# Patient Record
Sex: Female | Born: 1955
Health system: Southern US, Community
[De-identification: ages and names within clinical notes are randomized; demographics above are authoritative.]

## PROBLEM LIST (undated history)

## (undated) DIAGNOSIS — I10 Essential (primary) hypertension: Secondary | ICD-10-CM

## (undated) DIAGNOSIS — F419 Anxiety disorder, unspecified: Secondary | ICD-10-CM

## (undated) DIAGNOSIS — N809 Endometriosis, unspecified: Secondary | ICD-10-CM

## (undated) DIAGNOSIS — N879 Dysplasia of cervix uteri, unspecified: Secondary | ICD-10-CM

## (undated) DIAGNOSIS — R51 Headache: Secondary | ICD-10-CM

## (undated) DIAGNOSIS — C801 Malignant (primary) neoplasm, unspecified: Secondary | ICD-10-CM

## (undated) DIAGNOSIS — B001 Herpesviral vesicular dermatitis: Secondary | ICD-10-CM

## (undated) DIAGNOSIS — R519 Headache, unspecified: Secondary | ICD-10-CM

## (undated) DIAGNOSIS — Z8489 Family history of other specified conditions: Secondary | ICD-10-CM

## (undated) DIAGNOSIS — K859 Acute pancreatitis without necrosis or infection, unspecified: Secondary | ICD-10-CM

## (undated) DIAGNOSIS — K635 Polyp of colon: Secondary | ICD-10-CM

## (undated) DIAGNOSIS — G4700 Insomnia, unspecified: Secondary | ICD-10-CM

## (undated) DIAGNOSIS — E119 Type 2 diabetes mellitus without complications: Secondary | ICD-10-CM

## (undated) HISTORY — PX: KNEE ARTHROSCOPY: SUR90

## (undated) HISTORY — DX: Herpesviral vesicular dermatitis: B00.1

## (undated) HISTORY — DX: Malignant (primary) neoplasm, unspecified: C80.1

## (undated) HISTORY — DX: Type 2 diabetes mellitus without complications: E11.9

## (undated) HISTORY — DX: Endometriosis, unspecified: N80.9

## (undated) HISTORY — PX: TUBAL LIGATION: SHX77

## (undated) HISTORY — DX: Essential (primary) hypertension: I10

## (undated) HISTORY — DX: Insomnia, unspecified: G47.00

## (undated) HISTORY — PX: ABDOMINAL HYSTERECTOMY: SHX81

## (undated) HISTORY — DX: Anxiety disorder, unspecified: F41.9

## (undated) HISTORY — DX: Dysplasia of cervix uteri, unspecified: N87.9

## (undated) HISTORY — PX: CHOLECYSTECTOMY: SHX55

## (undated) HISTORY — PX: OTHER SURGICAL HISTORY: SHX169

## (undated) HISTORY — PX: TONSILLECTOMY: SUR1361

---

## 1997-11-12 ENCOUNTER — Other Ambulatory Visit: Admission: RE | Admit: 1997-11-12 | Discharge: 1997-11-12 | Payer: Self-pay

## 2010-02-14 ENCOUNTER — Encounter: Admission: RE | Admit: 2010-02-14 | Discharge: 2010-02-14 | Payer: Self-pay | Admitting: Unknown Physician Specialty

## 2011-01-23 ENCOUNTER — Other Ambulatory Visit: Payer: Self-pay | Admitting: Family Medicine

## 2011-01-23 DIAGNOSIS — Z1231 Encounter for screening mammogram for malignant neoplasm of breast: Secondary | ICD-10-CM

## 2011-03-27 ENCOUNTER — Ambulatory Visit
Admission: RE | Admit: 2011-03-27 | Discharge: 2011-03-27 | Disposition: A | Discharge status: Home / Self Care | Payer: 59 | Source: Ambulatory Visit | Attending: Family Medicine | Admitting: Family Medicine

## 2011-03-27 DIAGNOSIS — Z1231 Encounter for screening mammogram for malignant neoplasm of breast: Secondary | ICD-10-CM

## 2011-09-28 LAB — BASIC METABOLIC PANEL
BUN: 19 (ref 4–21)
CREATININE: 1 (ref 0.5–1.1)
Glucose: 104
POTASSIUM: 3.7 (ref 3.4–5.3)
Sodium: 139 (ref 137–147)

## 2012-08-01 ENCOUNTER — Other Ambulatory Visit: Payer: Self-pay

## 2012-08-01 DIAGNOSIS — Z1231 Encounter for screening mammogram for malignant neoplasm of breast: Secondary | ICD-10-CM

## 2012-08-14 ENCOUNTER — Ambulatory Visit
Admission: RE | Admit: 2012-08-14 | Discharge: 2012-08-14 | Disposition: A | Discharge status: Home / Self Care | Payer: 59 | Source: Ambulatory Visit

## 2012-08-14 DIAGNOSIS — Z1231 Encounter for screening mammogram for malignant neoplasm of breast: Secondary | ICD-10-CM

## 2012-08-14 LAB — BASIC METABOLIC PANEL
BUN: 27 — AB (ref 4–21)
Creatinine: 1.1 (ref 0.5–1.1)
GLUCOSE: 93
Potassium: 4.4 (ref 3.4–5.3)
SODIUM: 138 (ref 137–147)

## 2012-09-26 LAB — BASIC METABOLIC PANEL
BUN: 15 (ref 4–21)
Creatinine: 1.1 (ref 0.5–1.1)
GLUCOSE: 93
POTASSIUM: 3.8 (ref 3.4–5.3)
SODIUM: 138 (ref 137–147)

## 2013-03-04 LAB — CBC AND DIFFERENTIAL
HEMATOCRIT: 37 (ref 36–46)
Hemoglobin: 12.6 (ref 12.0–16.0)
PLATELETS: 302 (ref 150–399)
WBC: 6.1

## 2013-03-04 LAB — BASIC METABOLIC PANEL
BUN: 15 (ref 4–21)
Creatinine: 0.9 (ref 0.5–1.1)
GLUCOSE: 74

## 2013-03-04 LAB — HEPATIC FUNCTION PANEL
ALT: 27 (ref 7–35)
AST: 21 (ref 13–35)
Alkaline Phosphatase: 102 (ref 25–125)
Bilirubin, Total: 0.2

## 2013-09-24 ENCOUNTER — Other Ambulatory Visit: Payer: Self-pay

## 2013-09-24 DIAGNOSIS — Z1231 Encounter for screening mammogram for malignant neoplasm of breast: Secondary | ICD-10-CM

## 2013-10-05 ENCOUNTER — Ambulatory Visit
Admission: RE | Admit: 2013-10-05 | Discharge: 2013-10-05 | Disposition: A | Discharge status: Home / Self Care | Payer: 59 | Source: Ambulatory Visit

## 2013-10-05 ENCOUNTER — Encounter (INDEPENDENT_AMBULATORY_CARE_PROVIDER_SITE_OTHER): Payer: Self-pay

## 2013-10-05 DIAGNOSIS — Z1231 Encounter for screening mammogram for malignant neoplasm of breast: Secondary | ICD-10-CM

## 2013-10-23 LAB — BASIC METABOLIC PANEL
BUN: 21 (ref 4–21)
Glucose: 90
POTASSIUM: 3.7 (ref 3.4–5.3)
Sodium: 139 (ref 137–147)

## 2013-10-23 LAB — LIPID PANEL
CHOLESTEROL: 204 — AB (ref 0–200)
HDL: 54 (ref 35–70)
LDL Cholesterol: 111
Triglycerides: 196 — AB (ref 40–160)

## 2014-04-30 DIAGNOSIS — C801 Malignant (primary) neoplasm, unspecified: Secondary | ICD-10-CM

## 2014-04-30 HISTORY — DX: Malignant (primary) neoplasm, unspecified: C80.1

## 2014-05-12 LAB — CBC AND DIFFERENTIAL
HEMATOCRIT: 40 (ref 36–46)
Hemoglobin: 14 (ref 12.0–16.0)
PLATELETS: 497 — AB (ref 150–399)
WBC: 8.6

## 2014-05-12 LAB — HEPATIC FUNCTION PANEL
ALT: 202 — AB (ref 7–35)
AST: 81 — AB (ref 13–35)
Alkaline Phosphatase: 383 — AB (ref 25–125)
BILIRUBIN, TOTAL: 1.1

## 2014-05-12 LAB — BASIC METABOLIC PANEL
BUN: 17 (ref 4–21)
Creatinine: 1 (ref 0.5–1.1)
Glucose: 117

## 2014-05-13 ENCOUNTER — Other Ambulatory Visit: Payer: Self-pay | Admitting: Family Medicine

## 2014-05-13 DIAGNOSIS — R748 Abnormal levels of other serum enzymes: Secondary | ICD-10-CM

## 2014-05-14 ENCOUNTER — Other Ambulatory Visit: Payer: Self-pay | Admitting: Family Medicine

## 2014-05-14 ENCOUNTER — Ambulatory Visit
Admission: RE | Admit: 2014-05-14 | Discharge: 2014-05-14 | Disposition: A | Discharge status: Home / Self Care | Payer: 59 | Source: Ambulatory Visit | Attending: Family Medicine | Admitting: Family Medicine

## 2014-05-14 DIAGNOSIS — R748 Abnormal levels of other serum enzymes: Secondary | ICD-10-CM

## 2014-05-17 ENCOUNTER — Other Ambulatory Visit: Payer: Self-pay

## 2014-05-17 LAB — HEPATIC FUNCTION PANEL
ALT: 299 — AB (ref 7–35)
AST: 154 — AB (ref 13–35)
Alkaline Phosphatase: 584 — AB (ref 25–125)
Bilirubin, Total: 1.3

## 2014-05-17 LAB — BASIC METABOLIC PANEL
BUN: 19 (ref 4–21)
Creatinine: 1.1 (ref 0.5–1.1)
GLUCOSE: 106
POTASSIUM: 3.8 (ref 3.4–5.3)
Sodium: 140 (ref 137–147)

## 2014-05-27 ENCOUNTER — Ambulatory Visit: Payer: Self-pay | Admitting: Surgery

## 2014-06-01 ENCOUNTER — Ambulatory Visit: Payer: Self-pay | Admitting: Surgery

## 2014-08-23 LAB — SURGICAL PATHOLOGY

## 2014-08-29 NOTE — Op Note (Signed)
PATIENT NAME:  Mercedes Webb, Mercedes Webb MR#:  [id number] DATE OF BIRTH:  June 10, 1955  DATE OF PROCEDURE:  06/01/2014  PREOPERATIVE DIAGNOSIS: Chronic cholecystitis, cholelithiasis.   POSTOPERATIVE DIAGNOSIS: Chronic cholecystitis, cholelithiasis.   PROCEDURE: Laparoscopic cholecystectomy.   SURGEON: Loreli Dollar, MD   ANESTHESIA: General.   INDICATIONS: This 59 year old female has a history of episodes of epigastric pain, ultrasound findings of gallstones, transient elevation of her bilirubin. Surgery was recommended for definitive treatment.   DESCRIPTION OF PROCEDURE: The patient was placed on the operating table in the supine position under general endotracheal anesthesia. The abdomen was prepared with ChloraPrep, draped in a sterile manner.   A short incision was made in the inferior aspect of the umbilicus and carried down to the deep fascia which was grasped with laryngeal hook and elevated. A Veress needle was inserted, aspirated, and irrigated with a saline solution. Next, the peritoneal cavity was inflated with carbon dioxide. The Veress needle was removed. The 10 mm cannula was inserted. The 10 mm, 0 degree laparoscope was inserted to view the peritoneal cavity. There was an appearance of fatty infiltration of the liver. There were some adhesions between the omentum and the lower abdominal wall. Another incision was made in the epigastrium, slightly to the right of the falciform ligament, to insert an 11 mm cannula. Two incisions were made in the lateral aspect of the right upper quadrant to introduce two 5 mm cannulas.   The gallbladder was found to be partially contracted and was firm at its distal end and was retracted towards the right shoulder. Multiple adhesions were taken down with hook and cautery and sharp dissection. The gallbladder was retracted inferiorly and laterally. There appeared to be inflammation of the gallbladder with some thickening of the gallbladder wall. Multiple  additional adhesions were taken down with a somewhat tedious dissection. The gallbladder neck was mobilized with incision of the visceral peritoneum. The cystic artery was identified. There appeared to be evidence of inflammation in the region of the gallbladder neck and could not identify the cystic duct. The site of the porta hepatis was observed and did not see the common bile duct due to overlying fat. Next, the gallbladder neck was further mobilized so that  dissection was carried out circumferentially around the gallbladder neck and could see where the neck was tapering down towards the cystic duct and decision was made to divide the gallbladder neck with the Endo GIA stapler and exchange the 11 mm port for a 12 mm port. Inserted the blue cartridge Endo GIA stapler and placed across the gallbladder neck, engaged, and activated, dividing the proximal portion of the gallbladder neck. Next, the gallbladder was further mobilized and separated from the liver. It was further noted that the cystic artery was controlled with Endo clips and divided. The gallbladder was further dissected free from the liver. It is noted that during the course of the procedure, a number of small bleeding points were cauterized. The site was irrigated and aspirated multiple times using heparinized saline. At the end of the dissection, it appeared that hemostasis was intact. The gallbladder was placed into an Endo Catch bag and brought out through the infraumbilical port site. It was necessary to lengthen the skin incision by approximately 1 cm and lengthen the fascial incision by approximately 1 cm, and with further manipulation, removed the gallbladder and the bag and submitted the gallbladder with multiple stones for routine pathology.   The fascial defect was partially closed with 0 Maxon figure-of-eight  suture, and reinserted the 10 mm port and reinserted the laparoscope to view the right upper quadrant. The site was irrigated with  heparinized saline solution and aspirated and determined hemostasis was intact. There was no bile leakage. Next, the cannulas were removed, seeing no bleeding from the cannula sites. The remainder of the fascial defect at the umbilicus was closed with 0 Maxon figure-of-eight suture. The skin incisions were closed with interrupted 5-0 chromic subcuticular sutures, benzoin, and Steri-Strips. Dressings were applied with paper tape. The patient tolerated surgery satisfactorily.   This case was much more difficult than the typical case due to magnitude of inflammation in the gallbladder and required some 2 hours for the operation and the patient was transferred to the recovery room for postoperative care.   ____________________________ J. Rochel Brome, MD jws:LT D: 06/01/2014 16:33:49 ET T: 06/01/2014 20:53:13 ET JOB#: [id number]  cc: Loreli Dollar, MD, <Dictator> Loreli Dollar MD ELECTRONICALLY SIGNED 06/04/2014 7:51

## 2014-09-24 ENCOUNTER — Other Ambulatory Visit: Payer: Self-pay

## 2014-09-24 DIAGNOSIS — Z1231 Encounter for screening mammogram for malignant neoplasm of breast: Secondary | ICD-10-CM

## 2014-10-22 ENCOUNTER — Ambulatory Visit
Admission: RE | Admit: 2014-10-22 | Discharge: 2014-10-22 | Disposition: A | Discharge status: Home / Self Care | Payer: 59 | Source: Ambulatory Visit

## 2014-10-22 DIAGNOSIS — Z1231 Encounter for screening mammogram for malignant neoplasm of breast: Secondary | ICD-10-CM

## 2014-12-23 ENCOUNTER — Encounter: Payer: Self-pay | Admitting: Obstetrics and Gynecology

## 2014-12-23 ENCOUNTER — Ambulatory Visit (INDEPENDENT_AMBULATORY_CARE_PROVIDER_SITE_OTHER): Payer: 59 | Admitting: Obstetrics and Gynecology

## 2014-12-23 VITALS — BP 146/69 | HR 80 | Ht 67.0 in | Wt 179.7 lb

## 2014-12-23 DIAGNOSIS — Z0000 Encounter for general adult medical examination without abnormal findings: Secondary | ICD-10-CM | POA: Diagnosis not present

## 2014-12-23 DIAGNOSIS — E669 Obesity, unspecified: Secondary | ICD-10-CM | POA: Insufficient documentation

## 2014-12-23 DIAGNOSIS — N393 Stress incontinence (female) (male): Secondary | ICD-10-CM | POA: Diagnosis not present

## 2014-12-23 DIAGNOSIS — E663 Overweight: Secondary | ICD-10-CM

## 2014-12-23 DIAGNOSIS — Z87410 Personal history of cervical dysplasia: Secondary | ICD-10-CM | POA: Diagnosis not present

## 2014-12-23 DIAGNOSIS — N958 Other specified menopausal and perimenopausal disorders: Secondary | ICD-10-CM | POA: Diagnosis not present

## 2014-12-23 DIAGNOSIS — Z01419 Encounter for gynecological examination (general) (routine) without abnormal findings: Secondary | ICD-10-CM

## 2014-12-23 DIAGNOSIS — E8940 Asymptomatic postprocedural ovarian failure: Secondary | ICD-10-CM | POA: Insufficient documentation

## 2014-12-23 DIAGNOSIS — Z90710 Acquired absence of both cervix and uterus: Secondary | ICD-10-CM | POA: Diagnosis not present

## 2014-12-23 NOTE — Progress Notes (Signed)
Patient ID: Mercedes Webb, female   DOB: 1956-03-21, 59 y.o.   MRN: [id number] ANNUAL PREVENTATIVE CARE GYN  ENCOUNTER NOTE  Subjective:       Mercedes Webb is a 59 y.o. G1P0 female here for a routine annual gynecologic exam.  Current complaints: 1.  Bladder leaking when she coughs and sneezes   Patient reports leaking urine with coughing, sneezing, laughing and lifting.  She is not currently wearing pads.  She is status post TAH and status post BSO for endometriosis. Patient denies pelvic pressure.  No bowel symptom complaints.  Patient has not seen a gynecologist since Dr. Elisabeth Pigeon, left the area approximately 7 years ago.   Gynecologic History No LMP recorded. Patient has had a hysterectomy. TAH Contraception: status post hysterectomy Status post TAH (Dr. Elisabeth Pigeon) Last Pap: 2013. Results were: normal Last mammogram: 10/22/2014 birad 1. Results were: normal History of endometriosis. Status post BSO [facility]) SUI  Obstetric History OB History  Gravida Para Term Preterm AB SAB TAB Ectopic Multiple Living  1         1    # Outcome Date GA Lbr Len/2nd Weight Sex Delivery Anes PTL Lv  1 Gravida 1974   7 lb 1.9 oz (3.23 kg) F Vag-Spont   Y      Past Medical History  Diagnosis Date  . Anxiety   . Insomnia   . Hypertension   . Recurrent cold sores   . Endometriosis     Past Surgical History  Procedure Laterality Date  . Abdominal hysterectomy      tah/bso- endometriosis  . Cholecystectomy    . Tubal ligation    . Tonsillectomy      No current outpatient prescriptions on file prior to visit.   No current facility-administered medications on file prior to visit.    No Known Allergies  Social History   Social History  . Marital Status: Divorced    Spouse Name: N/A  . Number of Children: N/A  . Years of Education: N/A   Occupational History  . Not on file.   Social History Main Topics  . Smoking status: Former Smoker    Quit date:  04/30/1988  . Smokeless tobacco: Not on file  . Alcohol Use: Yes     Comment: occas  . Drug Use: No  . Sexual Activity: Yes    Birth Control/ Protection: Surgical   Other Topics Concern  . Not on file   Social History Narrative  . No narrative on file    Family History  Problem Relation Age of Onset  . Diabetes Father   . Heart disease Father   . Breast cancer Sister 16  . Diabetes Brother   . Colon cancer Neg Hx   . Ovarian cancer Neg Hx     The following portions of the patient's history were reviewed and updated as appropriate: allergies, current medications, past family history, past medical history, past social history, past surgical history and problem list.  Review of Systems ROS Review of Systems - General ROS: negative for - chills, fatigue, fever, hot flashes, night sweats, weight gain or weight loss Psychological ROS: negative for - anxiety, decreased libido, depression, mood swings, physical abuse or sexual abuse Ophthalmic ROS: negative for - blurry vision, eye pain or loss of vision ENT ROS: negative for - headaches, hearing change, visual changes or vocal changes Allergy and Immunology ROS: negative for - hives, itchy/watery eyes or seasonal allergies Hematological and Lymphatic  ROS: negative for - bleeding problems, bruising, swollen lymph nodes or weight loss Endocrine ROS: negative for - galactorrhea, hair pattern changes, hot flashes, malaise/lethargy, mood swings, palpitations, polydipsia/polyuria, skin changes, temperature intolerance or unexpected weight changes Breast ROS: negative for - new or changing breast lumps or nipple discharge Respiratory ROS: negative for - cough or shortness of breath Cardiovascular ROS: negative for - chest pain, irregular heartbeat, palpitations or shortness of breath Gastrointestinal ROS: no abdominal pain, change in bowel habits, or black or bloody stools Genito-Urinary ROS: no dysuria, trouble voiding, or  hematuria Musculoskeletal ROS: negative for - joint pain or joint stiffness Neurological ROS: negative for - bowel and bladder control changes Dermatological ROS: negative for rash and skin lesion changes   Objective:   BP 146/69 mmHg  Pulse 80  Ht 5\' 7"  (1.702 m)  Wt 179 lb 11.2 oz (81.511 kg)  BMI 28.14 kg/m2 CONSTITUTIONAL: Well-developed, well-nourished female in no acute distress.  PSYCHIATRIC: Normal mood and affect. Normal behavior. Normal judgment and thought content. [place]: Alert and oriented to person, place, and time. Normal muscle tone coordination. No cranial nerve deficit noted. HENT:  Normocephalic, atraumatic, External right and left ear normal. Oropharynx is clear and moist EYES: Conjunctivae and EOM are normal. Pupils are equal, round, and reactive to light. No scleral icterus.  NECK: Normal range of motion, supple, no masses.  Normal thyroid.  SKIN: Skin is warm and dry. No rash noted. Not diaphoretic.Multiple macules throughout trunk and extremities; none that appear suspicious. No erythema. No pallor. CARDIOVASCULAR: Normal heart rate noted, regular rhythm, no murmur. RESPIRATORY: Clear to auscultation bilaterally. Effort and breath sounds normal, no problems with respiration noted. BREASTS: Symmetric in size. No masses, skin changes, nipple drainage, or lymphadenopathy. ABDOMEN: Soft, normal bowel sounds, no distention noted.  No tenderness, rebound or guarding.  BLADDER: Normal PELVIC:  External Genitalia: Normal  BUS: Normal  Vagina: Normal Estrogen effect; excellent support; No rotational descent of the bladder neck with Valsalva; no evidence of cystocele or rectocele  Cervix: Surgically absent  Uterus: Surgically absent  Adnexa: Nonpalpable, nontender  RV: External Exam NormaI, No Rectal Masses and Normal Sphincter tone  MUSCULOSKELETAL: Normal range of motion. No tenderness.  No cyanosis, clubbing, or edema.  2+ distal pulses. LYMPHATIC: No Axillary,  Supraclavicular, or Inguinal Adenopathy.    Assessment:   Annual gynecologic examination 59 y.o. Contraception: status post hysterectomy Over weight Stress urinary incontinence, mild. No evidence of pelvic organ prolapse. Vagina with excellent estrogen effect. Problem List Items Addressed This Visit    None      Plan:  Pap: Not needed.  No abnormal Pap smears following hysterectomy Mammogram: Not Indicated.  Already completed for this year. Stool Guaiac Testing:  colonooscopy scheduled for 01/2015 Labs: thru pcp Routine preventative health maintenance measures emphasized: Exercise/Diet/Weight control, Tobacco Warnings and Alcohol/Substance use risks Physical therapy referral for stress urinary incontinence management. Kegel exercises reviewed and encouraged Estradiol 1 mg daily, refills Calcium with vitamin D supplementation.  Encouraged Return to [place], [place]  Brayton Mars, MD

## 2014-12-23 NOTE — Patient Instructions (Signed)
1. Pap not needed 2. Mammogram already done. 3. Colonoscopy for Colon cancer screening already scheduled. 4. Continue with Calcium and Vit D supplementation. 5. PT consult for SUI ayt [facility]. 6. Return in 1 year.

## 2015-01-04 ENCOUNTER — Ambulatory Visit: Payer: 59 | Attending: Obstetrics and Gynecology | Admitting: Physical Therapy

## 2015-01-04 DIAGNOSIS — R279 Unspecified lack of coordination: Secondary | ICD-10-CM | POA: Diagnosis present

## 2015-01-04 DIAGNOSIS — M629 Disorder of muscle, unspecified: Secondary | ICD-10-CM

## 2015-01-05 NOTE — Therapy (Signed)
[place] MAIN [facility] SERVICES [address] [place], [place], [address] Phone: [phone]   Fax:  [phone]  Physical Therapy Evaluation  Patient Details  Name: Mercedes Webb MRN: [id number] Date of Birth: August 06, 1955 Referring Provider:  Brayton Mars, *  Encounter Date: 01/04/2015      PT End of Session - 01/05/15 2250    Visit Number 1   Number of Visits 12   Date for PT Re-Evaluation 03/30/15   PT Start Time 2694   PT Stop Time 1820   PT Time Calculation (min) 70 min   Activity Tolerance Patient tolerated treatment well;No increased pain   Behavior During Therapy [facility] for tasks assessed/performed      Past Medical History  Diagnosis Date  . Anxiety   . Insomnia   . Hypertension   . Recurrent cold sores   . Endometriosis   . Cervical dysplasia     h/o    Past Surgical History  Procedure Laterality Date  . Abdominal hysterectomy      tah/bso- endometriosis  . Cholecystectomy    . Tubal ligation    . Tonsillectomy      There were no vitals filed for this visit.  Visit Diagnosis:  Fascial defect - Plan: PT plan of care cert/re-cert  Lack of coordination - Plan: PT plan of care cert/re-cert      Subjective Assessment - 01/04/15 1713    Subjective Pt reports leakage bladder when laughing and sneezing hard. This Sx has been progressing worse for 3 years.  Denied leakage w/ stairclimbing, pad wear. Pt refuses to wear pads but has had to change undergarments 2x/ month. Daily fluids: 4 (12 fl oz), 1 cup tea, 1 diet soda. Bowel movements: every other day. Hx of constipation but is eating better now. Denied dyspareunia. Pt has had Hx of partial and total hysterectomy, gallbladder surgery, and endometriosis. Pt reports she has difficulty with staying asleep and has anxiety.     Pertinent History 1 vaginal delivery, episiotomy. relaxation hobbies: walking on beach, soap opera watching,    Patient Stated Goals 1) sleep  better 2) decreased bladder symptoms  3) weight loss            [facility] PT Assessment - 01/05/15 2237    Assessment   Medical Diagnosis SUI   Balance Screen   Has the patient fallen in the past 6 months Yes   Observation/Other Assessments   Other Surveys  --  PFIQ: 33%, PSQI 24 pts    Coordination   Gross Motor Movements are Fluid and Coordinated --  lumbopelvic instability w. ASLR   Posture/Postural Control   Posture Comments limited diaphragmatic excursion, chest breathing   Palpation   Palpation comment significantly fascial restriction along multiple  LQ scars                 Pelvic Floor Special Questions - 01/05/15 2249    Skin Integrity --  atrophy labia majora   Scar Other   Scar other --  assess at next session   Pelvic Floor Internal Exam pt consented and had no contraindications   Exam Type Vaginal   Palpation bladder more caudal/ventral w/ posterior pelvic tilt,     Strength fair squeeze, definite lift  4/5 post-Tx: manual and posterior tilt edu   Strength # of reps 1   Strength # of seconds 10          [facility] Adult PT Treatment/Exercise - 01/05/15  2237    Neuro Re-ed    Neuro Re-ed Details  proper sitting posture   diaphragmatic breathing   Manual Therapy   Myofascial Release abdominal massage and also guided pt                 PT Education - 01/05/15 2249    Education provided Yes   Education Details HEP, POC, anatomy/physiology with pictures   Person(s) Educated Patient   Methods Explanation;Demonstration;Tactile cues;Verbal cues;Handout   Comprehension Verbalized understanding;Returned demonstration             PT Long Term Goals - 01/05/15 2301    PT LONG TERM GOAL #1   Title Pt will decrease her score on PFIQ: 33% to 0% in order to improve QOL.   Time 12   Period Weeks   Status New   PT LONG TERM GOAL #2   Title Pt will decrease her score on PSQI 24 pts to < 14 pt in order improve her quality of sleep.    Time 12    Period Weeks   Status New   PT LONG TERM GOAL #3   Title Pt will be demo decreased chest breathing and proper diaphragmatic breathing in order to progess to pelvic floor strengthening.   Time 12   Period Weeks   Status New   PT LONG TERM GOAL #4   Title Pt will demo pelvic floor strength 3/5/5/5 in order to decrease pad wear.    Time 12   Period Weeks   Status New               Plan - 01/05/15 2250    Clinical Impression Statement Pt is a 59 yo female whose S  & Sx consist of SUI, difficulty staying asleep, anxiety, decreased pelvic floor endurance, poor coordination of deep core mm, limited diaphragmatic excursion, poor posture and body mechanics, and decreased fascial mobility over lower abdomen. These deficits impact her QOL.     Pt will benefit from skilled therapeutic intervention in order to improve on the following deficits Decreased balance;Decreased activity tolerance;Decreased mobility;Decreased strength;Postural dysfunction;Improper body mechanics;Impaired flexibility;Decreased scar mobility;Hypomobility;Increased muscle spasms;Increased fascial restrictions;Difficulty walking;Decreased range of motion;Decreased coordination;Decreased endurance;Decreased safety awareness   Rehab Potential Good   Clinical Impairments Affecting Rehab Potential multiple abdominal surgeries   PT Frequency 1x / week   PT Duration 12 weeks   PT Treatment/Interventions ADLs/Self Care Home Management;Electrical Stimulation;Cryotherapy;Biofeedback;Aquatic Therapy;Moist Heat;Traction;Therapeutic activities;Therapeutic exercise;Balance training;Neuromuscular re-education;Gait training;Stair training;Functional mobility training;Patient/family education;Scar mobilization;Manual techniques;Dry needling;Energy conservation   PT Next Visit Plan thoracic release, diaphragmatic breathing   Consulted and Agree with Plan of Care Patient         Problem List Patient Active Problem List   Diagnosis  Date Noted  . Overweight 12/23/2014  . Surgical menopause 12/23/2014  . Status post abdominal hysterectomy 12/23/2014  . History of cervical dysplasia 12/23/2014  . SUI (stress urinary incontinence, female) 12/23/2014    [name]  ,PT, DPT, E-RYT  01/05/2015, 11:08 PM  [place] MAIN [facility] SERVICES [address] [place], [place], [address] Phone: [phone]   Fax:  [phone]

## 2015-01-05 NOTE — Patient Instructions (Addendum)
                                  Abdominal Massage (Handout)                  Preserve the function of your pelvic floor, abdomen, and back. Avoid decreased straining of abdominal/pelvic floor muscles with less slouching,  holding your breath with lifting/bowel movements)                                                     FUNCTIONAL POSTURES

## 2015-01-07 ENCOUNTER — Ambulatory Visit: Payer: 59 | Admitting: Physical Therapy

## 2015-01-07 DIAGNOSIS — M629 Disorder of muscle, unspecified: Secondary | ICD-10-CM | POA: Diagnosis not present

## 2015-01-07 DIAGNOSIS — R279 Unspecified lack of coordination: Secondary | ICD-10-CM

## 2015-01-07 NOTE — Patient Instructions (Signed)
Ribcage twists  Open book   At work: seated twist and arm swings        * exhale relax upper neck and shoulders     ____________________________   Back Lengthening   Modified down ward facing dog on arm of chair  5 breaths        ____________________________  Modified down ward facing dog on arm of chair  Grab opp calf /thigh for a twist        Pat your low back and twist ribs to face the ceiling

## 2015-01-07 NOTE — Therapy (Signed)
[place] MAIN [facility] SERVICES [address] [place], [place], [address] Phone: [phone]   Fax:  [phone]  Physical Therapy Treatment  Patient Details  Name: Mercedes Webb MRN: [id number] Date of Birth: Sep 15, 1955 Referring Provider:  Brayton Mars, *  Encounter Date: 01/07/2015      PT End of Session - 01/07/15 2147    Visit Number 2   Number of Visits 12   Date for PT Re-Evaluation 03/30/15   PT Start Time 0806   PT Stop Time 0906   PT Time Calculation (min) 60 min   Activity Tolerance Patient tolerated treatment well;No increased pain   Behavior During Therapy [facility] for tasks assessed/performed      Past Medical History  Diagnosis Date  . Anxiety   . Insomnia   . Hypertension   . Recurrent cold sores   . Endometriosis   . Cervical dysplasia     h/o    Past Surgical History  Procedure Laterality Date  . Abdominal hysterectomy      tah/bso- endometriosis  . Cholecystectomy    . Tubal ligation    . Tonsillectomy      There were no vitals filed for this visit.  Visit Diagnosis:  Fascial defect  Lack of coordination      Subjective Assessment - 01/07/15 0813    Subjective Pt has been constantly keeping herself in check at her desk by taking more breaks from sitting and practicing proper sitting posture. Pt performed her abdominal massage last night. Pt found herself falling asleep herself very easily with a winddown routine of  turiing off her TV, looking at magazine, and then performed her massage.  Pt slept well and feels rested.    Pertinent History 1 vaginal delivery, episiotomy. relaxation hobbies: walking on beach, soap opera watching,    Patient Stated Goals 1) sleep better 2) decreased bladder symptoms  3) weight loss            [facility] PT Assessment - 01/07/15 2140    Observation/Other Assessments   Observations bra strap placed too high, limited diaphragmatic excursion   Posture/Postural  Control   Posture Comments increased diaphragmatic breathing post-Tx w/ 1.5 cm excursion    Palpation   Spinal mobility increased mobility of SP of T12    SI assessment  decreased mm parapinal mm tensions post-Tx  increased fascial tensions along suprapubic area                      [facility] Adult PT Treatment/Exercise - 01/07/15 2140    Neuro Re-ed    Neuro Re-ed Details  diaphragmatic breathing, pelic neutral in supine, seated   Exercises   Other Exercises  thoracic rotation and back lengthening  ROM for home, work,  see pt instructions   Manual Therapy   Joint Mobilization Grade III TP, CVJ, PA on SP of thoracic segments    Soft tissue mobilization effleurage, stripping, MWM along paraspinal bilaterally    Myofascial Release scar massage,  suprapubic area , jostling                PT Education - 01/07/15 2147    Education provided Yes   Education Details HEP   Person(s) Educated Patient   Methods Explanation;Demonstration;Tactile cues;Verbal cues;Handout   Comprehension Verbalized understanding;Returned demonstration             PT Long Term Goals - 01/05/15 2301    PT LONG TERM GOAL #  1   Title Pt will decrease her score on PFIQ: 33% to 0% in order to improve QOL.   Time 12   Period Weeks   Status New   PT LONG TERM GOAL #2   Title Pt will decrease her score on PSQI 24 pts to < 14 pt in order improve her quality of sleep.    Time 12   Period Weeks   Status New   PT LONG TERM GOAL #3   Title Pt will be demo decreased chest breathing and proper diaphragmatic breathing in order to progess to pelvic floor strengthening.   Time 12   Period Weeks   Status New   PT LONG TERM GOAL #4   Title Pt will demo pelvic floor strength 3/5/5/5 in order to decrease pad wear.    Time 12   Period Weeks   Status New               Plan - 01/07/15 2148    Clinical Impression Statement Pt is progressing well with good carry over with postural awareness  and showed increased diaphragmatic excursion post-Tx. Pt tolerate manual Tx without complaint. Pt will continue to benefit from continued manual Tx to decrease scar adhesion over abdomen.     Pt will benefit from skilled therapeutic intervention in order to improve on the following deficits Decreased balance;Decreased activity tolerance;Decreased mobility;Decreased strength;Postural dysfunction;Improper body mechanics;Impaired flexibility;Decreased scar mobility;Hypomobility;Increased muscle spasms;Increased fascial restricitons;Difficulty walking;Decreased range of motion;Decreased coordination;Decreased endurance;Decreased safety awareness   Rehab Potential Good   Clinical Impairments Affecting Rehab Potential multiple abdominal surgeries   PT Frequency 1x / week   PT Duration 12 weeks   PT Treatment/Interventions ADLs/Self Care Home Management;Electrical Stimulation;Cryotherapy;Biofeedback;Aquatic Therapy;Moist Heat;Traction;Therapeutic activities;Therapeutic exercise;Balance training;Neuromuscular re-education;Gait training;Stair training;Functional mobility training;Patient/family education;Scar mobilization;Manual techniques;Dry needling;Energy conservation   PT Next Visit Plan thoracic release, diaphragmatic breathing   Consulted and Agree with Plan of Care Patient        Problem List Patient Active Problem List   Diagnosis Date Noted  . Overweight 12/23/2014  . Surgical menopause 12/23/2014  . Status post abdominal hysterectomy 12/23/2014  . History of cervical dysplasia 12/23/2014  . SUI (stress urinary incontinence, female) 12/23/2014    [name] ,PT, DPT, E-RYT  01/07/2015, 9:54 PM  [place] MAIN [facility] SERVICES [address] [place], [place], [address] Phone: [phone]   Fax:  [phone]

## 2015-01-11 ENCOUNTER — Ambulatory Visit: Payer: 59 | Admitting: Physical Therapy

## 2015-01-11 DIAGNOSIS — M629 Disorder of muscle, unspecified: Secondary | ICD-10-CM

## 2015-01-11 DIAGNOSIS — R279 Unspecified lack of coordination: Secondary | ICD-10-CM

## 2015-01-11 NOTE — Patient Instructions (Signed)
PELVIC FLOOR / KEGEL EXERCISES   Pelvic floor/ Kegel exercises are used to strengthen the muscles in the base of your pelvis that are responsible for supporting your pelvic organs and preventing urine/feces leakage. Based on your therapist's recommendations, they can be performed while standing, sitting, or lying down.  Make yourself aware of this muscle group by using these cues:  Imagine you are in a crowded room and you feel the need to pass gas. Your response is to pull up and in at the rectum.  Close the rectum. Pull the muscles up inside your body,feeling your scrotum lifting as well . Feel the pelvic floor muscles lift as if you were walking into a cold lake.  Place your hand on top of your pubic bone. Tighten and draw in the muscles around the anal muscles without squeezing the buttock muscles.  Common Errors:  Breath holding: If you are holding your breath, you may be bearing down against your bladder instead of pulling it up. If you belly bulges up while you are squeezing, you are holding your breath. Be sure to breathe gently in and out while exercising. Counting out loud may help you avoid holding your breath.  Accessory muscle use: You should not see or feel other muscle movement when performing pelvic floor exercises. When done properly, no one can tell that you are performing the exercises. Keep the buttocks, belly and inner thighs relaxed.  Overdoing it: Your muscles can fatigue and stop working for you if you over-exercise. You may actually leak more or feel soreness at the lower abdomen or rectum.  YOUR HOME EXERCISE PROGRAM  LONG HOLDS: Position: on back  Inhale and then exhale. Then squeeze the muscle and count aloud for 8 seconds. Rest with three long breaths. (Be sure to let belly sink in with exhales and not push outward)  Perform 6 repetitions, 6 times/day  SHORT HOLDS: Position: on back, sitting   Inhale and then exhale. Then squeeze the muscle.  (Be sure to  let belly sink in with exhales and not push outward)  Perform 5 repetitions, 5  Times/day  **ALSO SQUEEZE BEFORE YOUR SNEEZE, COUGH, LAUGH to decrease downward pressure   **ALSO EXHALE BEFORE YOU RISE AGAINST GRAVITY (lifting, sit to stand, from squat to stand)

## 2015-01-11 NOTE — Therapy (Signed)
[place] MAIN [facility] SERVICES [address] [place], [place], [address] Phone: [phone]   Fax:  [phone]  Physical Therapy Treatment  Patient Details  Name: Mercedes Webb MRN: [id number] Date of Birth: [birth year] Referring Provider:  Brayton Mars, *  Encounter Date: 01/11/2015      PT End of Session - 01/11/15 1809    Visit Number 3   Number of Visits 12   Date for PT Re-Evaluation 03/30/15   PT Start Time 0507   PT Stop Time 0605   PT Time Calculation (min) 58 min   Activity Tolerance Patient tolerated treatment well;No increased pain   Behavior During Therapy [facility] for tasks assessed/performed      Past Medical History  Diagnosis Date  . Anxiety   . Insomnia   . Hypertension   . Recurrent cold sores   . Endometriosis   . Cervical dysplasia     h/o    Past Surgical History  Procedure Laterality Date  . Abdominal hysterectomy      tah/bso- endometriosis  . Cholecystectomy    . Tubal ligation    . Tonsillectomy      There were no vitals filed for this visit.  Visit Diagnosis:  Fascial defect  Lack of coordination      Subjective Assessment - 01/11/15 1708    Subjective Pt has been performing HEP. Pt has been consistent with her bedtime routine. Pt has consulted her wellness coach re: eating properly with consideration of gall bladder removal. Pt reports she had two nights of deep sound sleep.     Pertinent History 1 vaginal delivery, episiotomy. relaxation hobbies: walking on beach, soap opera watching,    Patient Stated Goals 1) sleep better 2) decreased bladder symptoms  3) weight loss                      Pelvic Floor Special Questions - 01/11/15 1804    Pelvic Floor Internal Exam pt consented and had no contraindications   Exam Type Vaginal   Palpation bladder in normal position    Strength good squeeze, good lift, able to hold agaisnt strong resistance   Strength # of reps 6   Strength # of seconds 8           [facility] Adult PT Treatment/Exercise - 01/11/15 1735    Neuro Re-ed    Neuro Re-ed Details  pelvic floor 8 sec holds, 6 rep, dynamic stabilization 1-2 w/ cuing for less abdominal expansion and more diaphragmatic excursion    Manual Therapy   Soft tissue mobilization jostling, fascial release, skin rolling   Myofascial Release scar massage,  suprapubic area , jostling                     PT Long Term Goals - 01/11/15 1808    PT LONG TERM GOAL #1   Title Pt will decrease her score on PFIQ: 33% to 0% in order to improve QOL.   Time 12   Period Weeks   Status On-going   PT LONG TERM GOAL #2   Title Pt will decrease her score on PSQI 24 pts to < 14 pt in order improve her quality of sleep.    Time 12   Period Weeks   Status On-going   PT LONG TERM GOAL #3   Title Pt will be demo decreased chest breathing and proper diaphragmatic breathing in order to progess to pelvic  floor strengthening.   Time 12   Period Weeks   Status On-going   PT LONG TERM GOAL #4   Title Pt will demo pelvic floor strength 3/5/5/5 in order to decrease pad wear.    Time 12   Period Weeks   Status On-going               Plan - 01/11/15 1809    Clinical Impression Statement Pt tolerated manual Tx without complaint with report of decreased "pulling "sensation around scars. Pt demo'd normal positioning of bladder post-Tx and increased pelvic floor contraction strength. Imitated dynamic stabilization 1-2 and reinforced the importance of stretching mid back area. Pt will  return to PT after 2 weeks upon returning from vacation.    Pt will benefit from skilled therapeutic intervention in order to improve on the following deficits Decreased balance;Decreased activity tolerance;Decreased mobility;Decreased strength;Postural dysfunction;Improper body mechanics;Impaired flexibility;Decreased scar mobility;Hypomobility;Increased muscle spasms;Increased fascial  restrictions;Difficulty walking;Decreased range of motion;Decreased coordination;Decreased endurance;Decreased safety awareness   Rehab Potential Good   Clinical Impairments Affecting Rehab Potential multiple abdominal surgeries   PT Frequency 1x / week   PT Duration 12 weeks   PT Treatment/Interventions ADLs/Self Care Home Management;Electrical Stimulation;Cryotherapy;Biofeedback;Aquatic Therapy;Moist Heat;Traction;Therapeutic activities;Therapeutic exercise;Balance training;Neuromuscular re-education;Gait training;Stair training;Functional mobility training;Patient/family education;Scar mobilization;Manual techniques;Dry needling;Energy conservation   Consulted and Agree with Plan of Care Patient        Problem List Patient Active Problem List   Diagnosis Date Noted  . Overweight 12/23/2014  . Surgical menopause 12/23/2014  . Status post abdominal hysterectomy 12/23/2014  . History of cervical dysplasia 12/23/2014  . SUI (stress urinary incontinence, female) 12/23/2014    [name] ,PT, DPT, E-RYT  01/11/2015, 6:11 PM  [place] MAIN [facility] SERVICES [address] [place], [place], [address] Phone: [phone]   Fax:  [phone]

## 2015-01-14 ENCOUNTER — Encounter: Payer: 59 | Admitting: Physical Therapy

## 2015-01-25 ENCOUNTER — Ambulatory Visit: Payer: 59 | Admitting: Physical Therapy

## 2015-01-27 ENCOUNTER — Encounter: Payer: 59 | Admitting: Physical Therapy

## 2015-01-27 ENCOUNTER — Ambulatory Visit: Payer: 59 | Admitting: Physical Therapy

## 2015-01-27 DIAGNOSIS — M629 Disorder of muscle, unspecified: Secondary | ICD-10-CM

## 2015-01-27 DIAGNOSIS — R279 Unspecified lack of coordination: Secondary | ICD-10-CM

## 2015-01-27 NOTE — Therapy (Signed)
[place] MAIN [facility] SERVICES [address] [place], [place], [address] Phone: [phone]   Fax:  [phone]  Physical Therapy Treatment  Patient Details  Name: Mercedes Webb MRN: [id number] Date of Birth: [birth year] Referring Katelinn Justice:  Brayton Mars, *  Encounter Date: 01/27/2015      PT End of Session - 01/27/15 2155    Visit Number 4   Number of Visits 12   Date for PT Re-Evaluation 03/30/15   PT Start Time 1702   PT Stop Time 3532   PT Time Calculation (min) 43 min   Activity Tolerance Patient tolerated treatment well;No increased pain   Behavior During Therapy [facility] for tasks assessed/performed      Past Medical History  Diagnosis Date  . Anxiety   . Insomnia   . Hypertension   . Recurrent cold sores   . Endometriosis   . Cervical dysplasia     h/o    Past Surgical History  Procedure Laterality Date  . Abdominal hysterectomy      tah/bso- endometriosis  . Cholecystectomy    . Tubal ligation    . Tonsillectomy      There were no vitals filed for this visit.  Visit Diagnosis:  Fascial defect  Lack of coordination      Subjective Assessment - 01/27/15 1719    Subjective Pt reported she returned from vacation. Pt tried doing her HEP while at the beach. Pt continued to be aware of correcting posture. Pt noticed she leaked less when she had one sneeze episode because she followed PT's recomendation to let the sneeze got instead of holding it in. Pt also reported she squeezed the pelvic floor with the sneeze.    Pertinent History 1 vaginal delivery, episiotomy. relaxation hobbies: walking on beach, soap opera watching,    Patient Stated Goals 1) sleep better 2) decreased bladder symptoms  3) weight loss            [facility] PT Assessment - 01/27/15 2153    Palpation   Palpation comment decreased scar restrictions in LQ of abdomen, noted firmer lower abdomen compard to [facility]                   Pelvic Floor Special Questions - 01/27/15 1721    Pelvic Floor Internal Exam pt consented and had no contraindications   Exam Type Vaginal   Palpation bladder in normal position    Strength good squeeze, good lift, able to hold agaisnt strong resistance  (weaker when pt contracted on inhalation and and decreased endurance with breathholding)   Strength # of reps 7   Strength # of seconds 7   Biofeedback required cuing for decreased breathholding                   PT Education - 01/27/15 2155    Education provided Yes   Education Details HEP   Person(s) Educated Patient   Methods Explanation;Demonstration;Tactile cues;Verbal cues   Comprehension Verbalized understanding;Returned demonstration             PT Long Term Goals - 01/27/15 2158    PT LONG TERM GOAL #1   Title Pt will decrease her score on PFIQ: 33% to 0% in order to improve QOL.   Time 12   Period Weeks   Status On-going   PT LONG TERM GOAL #2   Title Pt will decrease her score on PSQI 24 pts to < 14 pt in  order improve her quality of sleep.    Time 12   Period Weeks   Status On-going   PT LONG TERM GOAL #3   Title Pt will be demo decreased chest breathing and proper diaphragmatic breathing in order to progess to pelvic floor strengthening.   Time 12   Period Weeks   Status Achieved   PT LONG TERM GOAL #4   Title Pt will demo pelvic floor strength 3/ > 5/ > 5/  >5  in order to decrease pad wear.    Time 12   Period Weeks   Status On-going               Plan - 01/27/15 2155    Clinical Impression Statement Lower quadrant of pt's abdomen showed increased scar mobility and firmer muscle tone. Pt's bladder also appeared in a more cephaled position compared to [facility]. Pt requried excessive cuing to coordinate pelvic floor enurance hold training in order to increase contraction strength, endurance, and to avoid breathholding.  Pt will benefit from skilled PT to address her goals.     Pt will  benefit from skilled therapeutic intervention in order to improve on the following deficits Decreased balance;Decreased activity tolerance;Decreased mobility;Decreased strength;Postural dysfunction;Improper body mechanics;Impaired flexibility;Decreased scar mobility;Hypomobility;Increased muscle spasms;Increased fascial restricitons;Difficulty walking;Decreased range of motion;Decreased coordination;Decreased endurance;Decreased safety awareness   Rehab Potential Good   Clinical Impairments Affecting Rehab Potential multiple abdominal surgeries   PT Frequency 1x / week   PT Duration 12 weeks   PT Treatment/Interventions ADLs/Self Care Home Management;Electrical Stimulation;Cryotherapy;Biofeedback;Aquatic Therapy;Moist Heat;Traction;Therapeutic activities;Therapeutic exercise;Balance training;Neuromuscular re-education;Gait training;Stair training;Functional mobility training;Patient/family education;Scar mobilization;Manual techniques;Dry needling;Energy conservation   Consulted and Agree with Plan of Care Patient        Problem List Patient Active Problem List   Diagnosis Date Noted  . Overweight 12/23/2014  . Surgical menopause 12/23/2014  . Status post abdominal hysterectomy 12/23/2014  . History of cervical dysplasia 12/23/2014  . SUI (stress urinary incontinence, female) 12/23/2014    [name]  ,PT, DPT, E-RYT  01/27/2015, 10:00 PM  [place] MAIN [facility] SERVICES [address] [place], [place], [address] Phone: [phone]   Fax:  [phone]

## 2015-01-27 NOTE — Patient Instructions (Addendum)
Pelvic Floor Progression: 7 sec holds, 7 reps, 3 x day  Count aloud to avoid holding your breath

## 2015-02-03 ENCOUNTER — Ambulatory Visit: Payer: 59 | Admitting: Physical Therapy

## 2015-02-07 LAB — HM COLONOSCOPY

## 2015-02-10 ENCOUNTER — Ambulatory Visit: Payer: 59 | Attending: Obstetrics and Gynecology | Admitting: Physical Therapy

## 2015-02-10 DIAGNOSIS — M629 Disorder of muscle, unspecified: Secondary | ICD-10-CM | POA: Diagnosis present

## 2015-02-10 DIAGNOSIS — R279 Unspecified lack of coordination: Secondary | ICD-10-CM

## 2015-02-10 NOTE — Patient Instructions (Signed)
Level 2 only 10 x 3 reps x 2 day   plus pelvic floor  10 sec holds, 10 reps 3 x day.  You are now ready to begin training the deep core muscles system: diaphragm, transverse abdominis, pelvic floor . These muscles must work together as a team.           The key to these exercises to train the brain to coordinate the timing of these muscles and to have them turn on for long periods of time to hold you upright against gravity (especially important if you are on your feet all day).These muscles are postural muscles and play a role stabilizing your spine and bodyweight. By doing these repetitions slowly and correctly instead of doing crunches, you will achieve a flatter belly without a lower pooch. You are also placing your spine in a more neutral position and breathing properly which in turn, decreases your risk for problems related to your pelvic floor, abdominal, and low back such as pelvic organ prolapse, hernias, diastasis recti (separation of superficial muscles), disk herniations, spinal fractures. These exercises set a solid foundation for you to later progress to resistance/ strength training with therabands and weights and return to other typical fitness exercises with a stronger deeper core.

## 2015-02-11 NOTE — Therapy (Signed)
[place] MAIN [facility] SERVICES [address] [place], [place], [address] Phone: [phone]   Fax:  [phone]  Physical Therapy Treatment  Patient Details  Name: Mercedes Webb MRN: [id number] Date of Birth: [birth year] No Data Recorded  Encounter Date: 02/10/2015      PT End of Session - 02/11/15 1821    Visit Number 5   PT Start Time 1500   PT Stop Time 1600   PT Time Calculation (min) 60 min      Past Medical History  Diagnosis Date  . Anxiety   . Insomnia   . Hypertension   . Recurrent cold sores   . Endometriosis   . Cervical dysplasia     h/o    Past Surgical History  Procedure Laterality Date  . Abdominal hysterectomy      tah/bso- endometriosis  . Cholecystectomy    . Tubal ligation    . Tonsillectomy      There were no vitals filed for this visit.  Visit Diagnosis:  Lack of coordination  Fascial defect      Subjective Assessment - 02/10/15 1710    Subjective Pt to cancel last week and pt has been trying to practice HEP.  Pt reports drinking 1 glass of tea,  2 (16 fl oz) , 1 bottle of Sprite, 2 (16 fl oz) water at night.  Pt reports she has a "bubbling sensation" associated with a sense of leakage if pt does not make it to the bathroom.  Pt reported she has been standing and moving her arms with long pelvic floor holds to keep coordinated.     Pertinent History 1 vaginal delivery, episiotomy. relaxation hobbies: walking on beach, soap opera watching,    Patient Stated Goals 1) sleep better 2) decreased bladder symptoms  3) weight loss            [facility] PT Assessment - 02/11/15 1821    Palpation   Palpation comment no increased fascial restrictions along abdominal scars                  Pelvic Floor Special Questions - 02/11/15 1821    Pelvic Floor Internal Exam pt consented and had no contraindications   Exam Type Vaginal   Palpation bladder in normal position    Strength good squeeze,  good lift, able to hold agaisnt strong resistance   Strength # of reps 10   Strength # of seconds 10   Biofeedback required cuing for neutral spine in hooklying            [facility] Adult PT Treatment/Exercise - 02/11/15 0001    Neuro Re-ed    Neuro Re-ed Details  dynamic stabilization level 2 x 10 reps x 3, progression of 10 sec hold x 10 reps with neutral spine.    Manual Therapy   Soft tissue mobilization jostling supra pubic area                 PT Education - 02/10/15 1743    Education provided Yes   Education Details HEP   Person(s) Educated Patient   Methods Explanation;Demonstration;Tactile cues;Verbal cues;Handout   Comprehension Verbalized understanding;Returned demonstration             PT Long Term Goals - 02/11/15 1823    PT LONG TERM GOAL #1   Title Pt will decrease her score on PFIQ: 33% to 0% in order to improve QOL.   Time 12   Period  Weeks   Status On-going   PT LONG TERM GOAL #2   Title Pt will decrease her score on PSQI 24 pts to < 14 pt in order improve her quality of sleep.    Time 12   Period Weeks   Status On-going   PT LONG TERM GOAL #3   Title Pt will be demo decreased chest breathing and proper diaphragmatic breathing in order to progess to pelvic floor strengthening.   Time 12   Period Weeks   Status Achieved   PT LONG TERM GOAL #4   Title Pt will demo pelvic floor strength 3/ > 5/ > 5/  >5  in order to decrease pad wear.    Time 12   Period Weeks   Status Achieved               Plan - 02/11/15 1825    Clinical Impression Statement Pt has achieved 2/4 goals. Pt has increased her pelvic floor strength/endurance since [facility] and shows increased fascial tensigrity as evident with decreased abdominal scar restrictions and more cephalad position of bladder.  Pt is progressing well towards her goals.     Pt will benefit from skilled therapeutic intervention in order to improve on the following deficits Decreased balance;Decreased  activity tolerance;Decreased mobility;Decreased strength;Postural dysfunction;Improper body mechanics;Impaired flexibility;Decreased scar mobility;Hypomobility;Increased muscle spasms;Increased fascial restricitons;Difficulty walking;Decreased range of motion;Decreased coordination;Decreased endurance;Decreased safety awareness   Rehab Potential Good   Clinical Impairments Affecting Rehab Potential multiple abdominal surgeries   PT Frequency 1x / week   PT Duration 12 weeks   PT Treatment/Interventions ADLs/Self Care Home Management;Electrical Stimulation;Cryotherapy;Biofeedback;Aquatic Therapy;Moist Heat;Traction;Therapeutic activities;Therapeutic exercise;Balance training;Neuromuscular re-education;Gait training;Stair training;Functional mobility training;Patient/family education;Scar mobilization;Manual techniques;Dry needling;Energy conservation   PT Next Visit Plan modified pilates with bands   Consulted and Agree with Plan of Care Patient        Problem List Patient Active Problem List   Diagnosis Date Noted  . Overweight 12/23/2014  . Surgical menopause 12/23/2014  . Status post abdominal hysterectomy 12/23/2014  . History of cervical dysplasia 12/23/2014  . SUI (stress urinary incontinence, female) 12/23/2014    [name]  ,PT, DPT, E-RYT  02/11/2015, 6:28 PM  [place] MAIN [facility] SERVICES [address] [place], [place], [address] Phone: [phone]   Fax:  [phone]  Name: Mercedes Webb MRN: [id number] Date of Birth: [birth year]

## 2015-02-24 ENCOUNTER — Ambulatory Visit: Payer: 59 | Admitting: Physical Therapy

## 2015-03-07 LAB — LIPID PANEL
Cholesterol: 174 (ref 0–200)
HDL: 52 (ref 35–70)
LDL CALC: 82
TRIGLYCERIDES: 202 — AB (ref 40–160)

## 2015-03-07 LAB — HEPATIC FUNCTION PANEL
ALK PHOS: 107 (ref 25–125)
ALT: 27 (ref 7–35)
AST: 20 (ref 13–35)
BILIRUBIN, TOTAL: 0.3

## 2015-03-07 LAB — BASIC METABOLIC PANEL
BUN: 18 (ref 4–21)
Creatinine: 0.9 (ref 0.5–1.1)
Glucose: 101
POTASSIUM: 4 (ref 3.4–5.3)
SODIUM: 140 (ref 137–147)

## 2015-03-08 ENCOUNTER — Ambulatory Visit: Payer: 59 | Attending: Obstetrics and Gynecology | Admitting: Physical Therapy

## 2015-03-08 DIAGNOSIS — R279 Unspecified lack of coordination: Secondary | ICD-10-CM

## 2015-03-08 DIAGNOSIS — M629 Disorder of muscle, unspecified: Secondary | ICD-10-CM | POA: Diagnosis present

## 2015-03-09 NOTE — Patient Instructions (Signed)
Handout with hand drawn illustrations    10 reps, 2x day:  D1 flex PNF  : "seatbelt" yellow band  D2 ext PNF   : "drawing the sword"  paloff press: "turning ribcage 10-20 deg"

## 2015-03-09 NOTE — Therapy (Signed)
[place] MAIN [facility] SERVICES [address] [place], [place], [address] Phone: [phone]   Fax:  [phone]  Physical Therapy Treatment  Patient Details  Name: Mercedes Webb MRN: [id number] Date of Birth: 09-30-1955 No Data Recorded  Encounter Date: 03/08/2015      PT End of Session - 03/09/15 2111    Visit Number 6   Number of Visits 12   Date for PT Re-Evaluation 03/30/15   PT Start Time 1710   PT Stop Time 9622   PT Time Calculation (min) 60 min   Activity Tolerance Patient tolerated treatment well;No increased pain   Behavior During Therapy [facility] for tasks assessed/performed      Past Medical History  Diagnosis Date  . Anxiety   . Insomnia   . Hypertension   . Recurrent cold sores   . Endometriosis   . Cervical dysplasia     h/o    Past Surgical History  Procedure Laterality Date  . Abdominal hysterectomy      tah/bso- endometriosis  . Cholecystectomy    . Tubal ligation    . Tonsillectomy      There were no vitals filed for this visit.  Visit Diagnosis:  Lack of coordination  Fascial defect      Subjective Assessment - 03/09/15 2055    Subjective Pt reported pt has not had any episodes of leakage with sneezing, coughing. Pt is concerned that she experienced leakage with sitting occuring "half a dozen" times for the past 3 weeks. She does not sense the leakage coming and she describes it as "a bubble of urine" that wets her undergarments .    Pertinent History 1 vaginal delivery, episiotomy. relaxation hobbies: walking on beach, soap opera watching,    Patient Stated Goals 1) sleep better 2) decreased bladder symptoms  3) weight loss            [facility] PT Assessment - 03/09/15 2055    Observation/Other Assessments   Observations improved posture   Palpation   Palpation comment increased fascial tensigrity lower abdomen                   Pelvic Floor Special Questions - 03/09/15 2057     Pelvic Floor Internal Exam pt consented and had no contraindications   Exam Type Vaginal   Palpation bladder in normal position    Strength good squeeze, good lift, able to hold against strong resistance   Strength # of reps 10   Strength # of seconds 10   Biofeedback required cuing for relaxation of pelvic floor, noted difficult to insert digit due to hyperactive mm 1-2nd layers           [facility] Adult PT Treatment/Exercise - 03/09/15 2057    Neuro Re-ed    Neuro Re-ed Details  relaxation of pelvic floor between contractions  with 3 full breaths, yellow D1 flex, D2 ext and paloff press 10 reps x 2 sets   cues for decrease overuse of upper traps                PT Education - 03/09/15 2110    Education provided Yes   Education Details HEP   Person(s) Educated Patient   Methods Demonstration;Tactile cues;Verbal cues;Explanation;Handout   Comprehension Verbalized understanding;Returned demonstration             PT Long Term Goals - 03/08/15 1715    PT LONG TERM GOAL #1   Title Pt will  decrease her score on PFIQ: 33% to 0% in order to improve QOL.   Time 12   Period Weeks   Status On-going   PT LONG TERM GOAL #2   Title Pt will decrease her score on PSQI 24 pts to < 14 pt in order improve her quality of sleep.    Time 12   Period Weeks   Status On-going   PT LONG TERM GOAL #3   Title Pt will be demo decreased chest breathing and proper diaphragmatic breathing in order to progess to pelvic floor strengthening.   Time 12   Period Weeks   Status Achieved   PT LONG TERM GOAL #4   Title Pt will demo pelvic floor strength 3/ > 5/ > 5/  >5  in order to decrease pad wear.    Time 12   Period Weeks   Status Achieved               Plan - 03/09/15 2111    Clinical Impression Statement Pt is progressing well with pelvic strengthening but required cues for relaxation of pelvic floor contraction in order to decrease hyperactivity of pelvic floor. Initiated deep  core activation with resistance exercises ( cross diagonals patterns). Pt needed excessive cues for shoulder depression to avoid overuse of upper traps. Anticipate pt will achieve her goals. Pt tolerated new HEP without complaint of pain.   Pt will benefit from skilled therapeutic intervention in order to improve on the following deficits Decreased balance;Decreased activity tolerance;Decreased mobility;Decreased strength;Postural dysfunction;Improper body mechanics;Impaired flexibility;Decreased scar mobility;Hypomobility;Increased muscle spasms;Increased fascial restricitons;Difficulty walking;Decreased range of motion;Decreased coordination;Decreased endurance;Decreased safety awareness   Rehab Potential Good   Clinical Impairments Affecting Rehab Potential multiple abdominal surgeries   PT Frequency Biweekly   PT Duration 12 weeks   PT Treatment/Interventions ADLs/Self Care Home Management;Electrical Stimulation;Cryotherapy;Biofeedback;Aquatic Therapy;Moist Heat;Traction;Therapeutic activities;Therapeutic exercise;Balance training;Neuromuscular re-education;Gait training;Stair training;Functional mobility training;Patient/family education;Scar mobilization;Manual techniques;Dry needling;Energy conservation   PT Next Visit Plan --   Consulted and Agree with Plan of Care Patient        Problem List Patient Active Problem List   Diagnosis Date Noted  . Overweight 12/23/2014  . Surgical menopause 12/23/2014  . Status post abdominal hysterectomy 12/23/2014  . History of cervical dysplasia 12/23/2014  . SUI (stress urinary incontinence, female) 12/23/2014    Mercedes Webb ,PT, DPT, E-RYT  03/09/2015, 9:17 PM  [place] MAIN [facility] SERVICES [address] [place], [place], [address] Phone: [phone]   Fax:  [phone]  Name: Mercedes Webb MRN: [id number] Date of Birth: August 27, 1955

## 2015-03-22 ENCOUNTER — Ambulatory Visit: Payer: 59 | Admitting: Physical Therapy

## 2015-03-22 DIAGNOSIS — R279 Unspecified lack of coordination: Secondary | ICD-10-CM

## 2015-03-22 DIAGNOSIS — M629 Disorder of muscle, unspecified: Secondary | ICD-10-CM

## 2015-03-23 NOTE — Patient Instructions (Signed)
Replace dynamic stabilization exercises with modified pilate (elbow bent.ext)   Maintain pelvic floor exercises and stretches

## 2015-03-23 NOTE — Therapy (Signed)
[place] MAIN [facility] SERVICES [address] [place], [place], [address] Phone: [phone]   Fax:  [phone]  Physical Therapy Treatment  Patient Details  Name: Mercedes Webb MRN: [id number] Date of Birth: 1955-08-23 No Data Recorded  Encounter Date: 03/22/2015    Past Medical History  Diagnosis Date  . Anxiety   . Insomnia   . Hypertension   . Recurrent cold sores   . Endometriosis   . Cervical dysplasia     h/o    Past Surgical History  Procedure Laterality Date  . Abdominal hysterectomy      tah/bso- endometriosis  . Cholecystectomy    . Tubal ligation    . Tonsillectomy      There were no vitals filed for this visit.  Visit Diagnosis:  Lack of coordination  Fascial defect      Subjective Assessment - 03/22/15 1711    Subjective Pt has not had any urine leakage the past week.    Pertinent History 1 vaginal delivery, episiotomy. relaxation hobbies: walking on beach, soap opera watching,    Patient Stated Goals 1) sleep better 2) decreased bladder symptoms  3) weight loss            [facility] PT Assessment - 03/23/15 1438    Palpation   Palpation comment increased scar mobility, abdomen more mm firmness                     [facility] Adult PT Treatment/Exercise - 03/23/15 1439    Self-Care   Self-Care --  discussed possible d/c at next visit    Neuro Re-ed    Neuro Re-ed Details  modified pilates with yellow band, excessive cuing alignment (shoulder flexion with elbow flexion with shoulder retraction)  Withheld exercise. Pt able to perform version with elbow bent to elbow extension without diffficulty.                 PT Education - 03/23/15 1441    Education provided Yes   Education Details HEP   Person(s) Educated Patient   Methods Explanation;Demonstration;Tactile cues;Verbal cues;Handout   Comprehension Verbalized understanding;Returned demonstration             PT Long Term  Goals - 03/22/15 1713    PT LONG TERM GOAL #1   Title Pt will decrease her score on PFIQ: 33% to 0% in order to improve QOL. (03/22/15   0%)    Time 12   Period Weeks   Status Achieved   PT LONG TERM GOAL #2   Title Pt will decrease her score on PSQI 24 pts to < 14 pt in order improve her quality of sleep.  (03/22/15: 12pt)    Time 12   Period Days   Status Achieved   PT LONG TERM GOAL #3   Title Pt will be demo decreased chest breathing and proper diaphragmatic breathing in order to progess to pelvic floor strengthening.   Period Days   Status Achieved   PT LONG TERM GOAL #4   Title Pt will demo pelvic floor strength 3/ > 5/ > 5/  >5  in order to decrease pad wear.    Period Days   Status Achieved   PT LONG TERM GOAL #5   Title Pt will demo ability to retract shoulders with proper spinal alignment and deep core engaged with modified pilates exercise (shoulder flexion, elbow extension) iin order to be IND with maintenance of core strengtheing exercises.  Time 4   Period Weeks   Status New               Plan - 03/23/15 1442    Clinical Impression Statement Pt shows signifcantly decreased mm tensions, decreased abdominal scar restriction with increased mm firmeness. Pt demo'd pelvic floor correctly and is not progressing to stengthening posterior outer core mm with resistance bands. Pt required excessive cuing for motor control (decreased upper trap overuse and propioception of shoulder / elbow kinetic chain.)   Anticipate pt will achieve her remaining goal and will be ready for discharge at next visit.    Pt will benefit from skilled therapeutic intervention in order to improve on the following deficits Decreased balance;Decreased activity tolerance;Decreased mobility;Decreased strength;Postural dysfunction;Improper body mechanics;Impaired flexibility;Decreased scar mobility;Hypomobility;Increased muscle spasms;Increased fascial restricitons;Difficulty walking;Decreased range of  motion;Decreased coordination;Decreased endurance;Decreased safety awareness   Rehab Potential Good   Clinical Impairments Affecting Rehab Potential multiple abdominal surgeries   PT Frequency Biweekly   PT Duration 12 weeks   PT Treatment/Interventions ADLs/Self Care Home Management;Electrical Stimulation;Cryotherapy;Biofeedback;Aquatic Therapy;Moist Heat;Traction;Therapeutic activities;Therapeutic exercise;Balance training;Neuromuscular re-education;Gait training;Stair training;Functional mobility training;Patient/family education;Scar mobilization;Manual techniques;Dry needling;Energy conservation   Consulted and Agree with Plan of Care Patient        Problem List Patient Active Problem List   Diagnosis Date Noted  . Overweight 12/23/2014  . Surgical menopause 12/23/2014  . Status post abdominal hysterectomy 12/23/2014  . History of cervical dysplasia 12/23/2014  . SUI (stress urinary incontinence, female) 12/23/2014    [name]  ,PT, DPT, E-RYT  03/23/2015, 2:50 PM  [place] MAIN [facility] SERVICES [address] [place], [place], [address] Phone: [phone]   Fax:  [phone]  Name: Mercedes Webb MRN: [id number] Date of Birth: 1955-09-04

## 2015-04-07 ENCOUNTER — Ambulatory Visit: Payer: 59 | Admitting: Physical Therapy

## 2015-04-18 ENCOUNTER — Ambulatory Visit: Payer: 59 | Attending: Obstetrics and Gynecology | Admitting: Physical Therapy

## 2015-04-18 ENCOUNTER — Encounter: Payer: Self-pay | Admitting: Physical Therapy

## 2015-04-18 DIAGNOSIS — M629 Disorder of muscle, unspecified: Secondary | ICD-10-CM | POA: Diagnosis present

## 2015-04-18 DIAGNOSIS — R279 Unspecified lack of coordination: Secondary | ICD-10-CM | POA: Diagnosis not present

## 2015-04-18 NOTE — Patient Instructions (Signed)
Modified pilates exercise 2 Red band (see sheet)   Modify by not holding hands in front, hold by pockets    Very little mini wall push-ups to learn how to engage midtraps/rhomboids / lower traps (elbow bend slightly down)  To avoid upper trap , hiking shoulders up  5 x/ day    Lat pull  Down  by doorway   (stand one foot forward)   10 reps  X  R leg forward,  10 reps x L leg forward

## 2015-04-18 NOTE — Therapy (Addendum)
[place] MAIN [facility] SERVICES [address] [place], [place], [address] Phone: [phone]   Fax:  [phone]  Physical Therapy Treatment/ Discharge Summary   Patient Details  Name: Mercedes Webb MRN: [id number] Date of Birth: 1956/03/13 No Data Recorded  Encounter Date: 04/18/2015      PT End of Session - 04/18/15 1107    Visit Number 7   Number of Visits 12   Date for PT Re-Evaluation 03/30/15   PT Start Time 0900   PT Stop Time 0945   PT Time Calculation (min) 45 min   Activity Tolerance Patient tolerated treatment well;No increased pain   Behavior During Therapy [facility] for tasks assessed/performed      Past Medical History  Diagnosis Date  . Anxiety   . Insomnia   . Hypertension   . Recurrent cold sores   . Endometriosis   . Cervical dysplasia     h/o  . 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 Abdominal hysterectomy      tah/bso- endometriosis  . Cholecystectomy    . Tubal ligation    . Tonsillectomy      There were no vitals filed for this visit.  Visit Diagnosis:  Lack of coordination  Fascial defect      Subjective Assessment - 04/18/15 0909    Subjective  Pt reports  she was able to attend a party and was able to prevent herself from  urinary leakage through laughter and  prolonged delay to urinate. Pt has been able to maintain her weight instad of gain and continues to work with her wellness. Pt reports she has been sleeping " a little bit better".  Pt finds when she has a relaxing time  prior to bed she is not as "keyed up" when going to bed.  This is a practice recommended to her at [facility]  and she has remained compliant.  Pt also reports her massage therapist commenting her her decreased shoulder tightness. Pt has undergone surgery to get cancerous squamous cell removed on bilateral feet and several other spots on hands  underwent "cryosurgery".    Pertinent History 1 vaginal delivery, episiotomy. relaxation hobbies: walking on beach, soap opera watching,    Patient Stated Goals 1) sleep better 2) decreased bladder symptoms  3) weight loss            [facility] PT Assessment - 04/18/15 0945    Observation/Other Assessments   Observations upright sitting and standing posture without cuing   Skin Integrity     Coordination   Gross Motor Movements are Fluid and Coordinated --  upper trap dominant with new HEP, required excessive cues   Fine Motor Movements are Fluid and Coordinated --  required down grade of HEP to learn scapular stabilization    Palpation   Palpation comment signifciantly decreased midback and upper trap mm tensions bilaterally                      [facility] Adult PT Treatment/Exercise - 04/18/15 1105    Neuro Re-ed    Neuro Re-ed Details  scapular stabilization to activate lower trap/ serratus anterior, requried down grade of shoulder flexion resistance band ex due to presentation of upper trap dominant patterns. Pt able to demo proper technique with mini push up and lat pull HEP.     Exercises  Other Exercises  see HEP                      PT Long Term Goals - 04/18/15 0917    PT LONG TERM GOAL #1   Title Pt will decrease her score on PFIQ: 33% to 0% in order to improve QOL. (03/22/15   0%)    Time 12   Period Weeks   Status Achieved   PT LONG TERM GOAL #2   Title Pt will decrease her score on PSQI 24 pts to < 14 pt in order improve her quality of sleep.  (03/22/15: 12pt)    Time 12   Period Days   Status Achieved   PT LONG TERM GOAL #3   Title Pt will be demo decreased chest breathing and proper diaphragmatic breathing in order to progess to pelvic floor strengthening.   Period Days   Status Achieved   PT LONG TERM GOAL #4   Title Pt will demo pelvic floor strength 3/ > 5/ > 5/  >5  in order to decrease pad wear.    Period Days   Status Achieved    PT LONG TERM GOAL #5   Title Pt will demo ability to retract shoulders with proper spinal alignment and deep core engaged with modified pilates exercise (shoulder flexion, elbow extension) iin order to be IND with maintenance of core strengtheing exercises.    Time 4   Period Weeks   Status Achieved               Plan - 04/18/15 0945    Clinical Impression Statement Pt reports she has improved "A very great deal better"    based on GROC. Pt shows decreased abdominal scar restriction, improved sitting/standing posture, and significantly improved pelvic floor contractions. Pt also shows improved motor control with deep core mm activation (diaphragmatic breathing) and  posterior core mm (scapular stabilization). Pt reports being able to sleep better with relaxation HEP and being able to prevent leakage at work and at community events. Pt is ready for d/c. Pt was a pleasure to work with and remained compliant across her [place] of 7 visits.   Thank you for your referral!   Pt will benefit from skilled therapeutic intervention in order to improve on the following deficits Decreased balance;Decreased activity tolerance;Decreased mobility;Decreased strength;Postural dysfunction;Improper body mechanics;Impaired flexibility;Decreased scar mobility;Hypomobility;Increased muscle spasms;Increased fascial restricitons;Difficulty walking;Decreased range of motion;Decreased coordination;Decreased endurance;Decreased safety awareness   Rehab Potential Good   Clinical Impairments Affecting Rehab Potential multiple abdominal surgeries   PT Frequency Biweekly   PT Duration 12 weeks   PT Treatment/Interventions ADLs/Self Care Home Management;Electrical Stimulation;Cryotherapy;Biofeedback;Aquatic Therapy;Moist Heat;Traction;Therapeutic activities;Therapeutic exercise;Balance training;Neuromuscular re-education;Gait training;Stair training;Functional mobility training;Patient/family education;Scar mobilization;Manual  techniques;Dry needling;Energy conservation   Consulted and Agree with Plan of Care Patient        Problem List Patient Active Problem List   Diagnosis Date Noted  . Overweight 12/23/2014  . Surgical menopause 12/23/2014  . Status post abdominal hysterectomy 12/23/2014  . History of cervical dysplasia 12/23/2014  . SUI (stress urinary incontinence, female) 12/23/2014    [name] ,PT, DPT, E-RYT  04/18/2015, 11:09 AM  [place] MAIN [facility] SERVICES [place], [place], [address] Phone: [phone]   Fax:  [phone]  Name: Mercedes Webb MRN: [id number] Date of Birth: [birth year]

## 2015-08-26 LAB — BASIC METABOLIC PANEL
BUN: 18 (ref 4–21)
Creatinine: 0.9 (ref 0.5–1.1)
Glucose: 121
Potassium: 3.8 (ref 3.4–5.3)
SODIUM: 137 (ref 137–147)

## 2015-09-28 ENCOUNTER — Encounter: Payer: Self-pay | Admitting: Obstetrics and Gynecology

## 2015-09-28 ENCOUNTER — Ambulatory Visit (INDEPENDENT_AMBULATORY_CARE_PROVIDER_SITE_OTHER): Payer: 59 | Admitting: Obstetrics and Gynecology

## 2015-09-28 VITALS — BP 153/84 | HR 72 | Ht 66.0 in | Wt 180.0 lb

## 2015-09-28 DIAGNOSIS — R635 Abnormal weight gain: Secondary | ICD-10-CM

## 2015-09-28 MED ORDER — PHENTERMINE HCL 37.5 MG PO TABS: 37.5000 mg | ORAL_TABLET | Freq: Every day | ORAL | Status: DC

## 2015-09-28 MED ORDER — CYANOCOBALAMIN 1000 MCG/ML IJ SOLN: 1000.0000 ug | Freq: Once | INTRAMUSCULAR | Status: DC

## 2015-09-28 NOTE — Progress Notes (Signed)
Subjective:  Mercedes Webb is a 60 y.o. G1P0 at Unknown being seen today for weight loss management- initial visit.  Patient reports General ROS: negative and reports previous weight loss attempts: include  Exercising daily, watching meal intake and meets with a personal trainer weekly. Wonders if hormones are not playing a part in it, and also sister has thyroid disorder.   The patient has a surgical history [id number].    The following portions of the patient's history were reviewed and updated as appropriate: allergies, current medications, past family history, past medical history, past social history, past surgical history and problem list.   Objective:   Filed Vitals:   09/28/15 1601  BP: 153/84  Pulse: 72  Height: 5\' 6"  (1.676 m)  Weight: 180 lb (81.647 kg)    General:  Alert, oriented and cooperative. Patient is in no acute distress.  :   :   :   :   :   :   PE: Well groomed female in no current distress,   Mental Status: Normal mood and affect. Normal behavior. Normal judgment and thought content.   Current BMI: Body mass index is 29.07 kg/(m^2).   Assessment and Plan:  Obesity  1. Weight gain  - Thyroid Panel With TSH - Comprehensive metabolic panel - Progesterone - DHEA-sulfate - Estradiol - VITAMIN D 25 Hydroxy (Vit-D Deficiency, Fractures)   Plan: low carb, High protein diet RX for adipex 37.5 mg daily and B12 1057mcg.ml monthly, to start now with first injection given at today's visit. Reviewed side-effects common to both medications and expected outcomes. Increase daily water intake to at least 8 bottle a day, every day.  Goal is to reduse weight by 10% by end of three months, and will re-evaluate then.  RTC in 4 weeks for Nurse visit to check weight & BP, and get next B12 injections.    Please refer to After Visit Summary for other counseling recommendations.    Cora Stetson N [place], CNM   Maloree Uplinger [employer], CNM      Consider the  Low Glycemic Index Diet and 6 smaller meals daily .  This boosts your metabolism and regulates your sugars:   Use the protein bar by Atkins because they have lots of fiber in them  Find the low carb flatbreads, tortillas and pita breads for sandwiches:  Joseph's makes a pita bread and a flat bread , available at [facility] and [employer]; [place] makes a low carb flatbread available at [employer] and HT that is 9 net carbs and 100 cal [place] makes a low carb whole wheat tortilla available at [employer] most grocery stores with 6 net carbs and 210 cal  [place] yogurt can still have a lot of carbs .  Dannon Light N fit has 80 cal and 8 carbs

## 2015-09-29 ENCOUNTER — Other Ambulatory Visit: Payer: Self-pay | Admitting: Obstetrics and Gynecology

## 2015-09-29 LAB — COMPREHENSIVE METABOLIC PANEL
ALBUMIN: 4.3 g/dL (ref 3.6–4.8)
ALK PHOS: 108 IU/L (ref 39–117)
ALT: 22 IU/L (ref 0–32)
AST: 19 IU/L (ref 0–40)
Albumin/Globulin Ratio: 1.4 (ref 1.2–2.2)
BUN / CREAT RATIO: 27 (ref 12–28)
BUN: 24 mg/dL (ref 8–27)
CHLORIDE: 97 mmol/L (ref 96–106)
CO2: 25 mmol/L (ref 18–29)
CREATININE: 0.9 mg/dL (ref 0.57–1.00)
Calcium: 9.4 mg/dL (ref 8.7–10.3)
GFR calc non Af Amer: 69 mL/min/{1.73_m2} (ref >59)
GFR, EST AFRICAN AMERICAN: 80 mL/min/{1.73_m2} (ref >59)
GLOBULIN, TOTAL: 3 g/dL (ref 1.5–4.5)
Glucose: 94 mg/dL (ref 65–99)
Potassium: 4.3 mmol/L (ref 3.5–5.2)
SODIUM: 139 mmol/L (ref 134–144)
TOTAL PROTEIN: 7.3 g/dL (ref 6.0–8.5)

## 2015-09-29 LAB — ESTRADIOL: Estradiol: 70.5 pg/mL

## 2015-09-29 LAB — THYROID PANEL WITH TSH
Free Thyroxine Index: 1.4 (ref 1.2–4.9)
T3 Uptake Ratio: 23 % — ABNORMAL LOW (ref 24–39)
T4 TOTAL: 5.9 ug/dL (ref 4.5–12.0)
TSH: 2.13 u[IU]/mL (ref 0.450–4.500)

## 2015-09-29 LAB — PROGESTERONE

## 2015-09-29 LAB — VITAMIN D 25 HYDROXY (VIT D DEFICIENCY, FRACTURES): VIT D 25 HYDROXY: 38.4 ng/mL (ref 30.0–100.0)

## 2015-09-29 LAB — DHEA-SULFATE: DHEA-SO4: 45.7 ug/dL (ref 29.4–220.5)

## 2015-09-29 MED ORDER — PROGESTERONE MICRONIZED 200 MG PO CAPS: 200.0000 mg | ORAL_CAPSULE | Freq: Every day | ORAL | Status: DC

## 2015-09-30 ENCOUNTER — Telehealth: Payer: Self-pay | Admitting: *Deleted

## 2015-09-30 NOTE — Telephone Encounter (Signed)
Notified pt. 

## 2015-09-30 NOTE — Telephone Encounter (Signed)
-----   Message from Joylene Igo, [place] sent at 09/29/2015  6:06 PM EDT ----- Please let her know all labs look normal except for progesterone (as suspected) , I am sending in a prescription for prometrium 200mg  nightly x 3 weeks each month for her to start.

## 2015-10-24 ENCOUNTER — Other Ambulatory Visit: Payer: Self-pay | Admitting: Family Medicine

## 2015-10-24 DIAGNOSIS — Z1231 Encounter for screening mammogram for malignant neoplasm of breast: Secondary | ICD-10-CM

## 2015-10-27 ENCOUNTER — Ambulatory Visit: Payer: 59

## 2015-10-27 ENCOUNTER — Ambulatory Visit (INDEPENDENT_AMBULATORY_CARE_PROVIDER_SITE_OTHER): Payer: 59 | Admitting: Obstetrics and Gynecology

## 2015-10-27 VITALS — BP 144/78 | HR 83 | Wt 175.5 lb

## 2015-10-27 DIAGNOSIS — E663 Overweight: Secondary | ICD-10-CM

## 2015-10-27 MED ORDER — CYANOCOBALAMIN 1000 MCG/ML IJ SOLN
1000.0000 ug | Freq: Once | INTRAMUSCULAR | Status: AC
  Administered 2015-10-27: 1000 ug via INTRAMUSCULAR

## 2015-10-27 NOTE — Progress Notes (Signed)
Pt presents for weight, B/P, B-12 injection. No side effects of medication-Phentermine, or B-12.  Weight loss of 5 lbs. Encouraged eating healthy and exercise.

## 2015-10-27 NOTE — Patient Instructions (Signed)
Pt presents for weight, B/P, B-12 injection. No side effects of medication-Phentermine, or B-12.  Weight loss of  5 lbs. Encouraged eating healthy and exercise.

## 2015-11-18 ENCOUNTER — Ambulatory Visit
Admission: RE | Admit: 2015-11-18 | Discharge: 2015-11-18 | Disposition: A | Discharge status: Home / Self Care | Payer: 59 | Source: Ambulatory Visit | Attending: Family Medicine | Admitting: Family Medicine

## 2015-11-18 DIAGNOSIS — Z1231 Encounter for screening mammogram for malignant neoplasm of breast: Secondary | ICD-10-CM

## 2015-11-25 ENCOUNTER — Ambulatory Visit (INDEPENDENT_AMBULATORY_CARE_PROVIDER_SITE_OTHER): Payer: 59 | Admitting: Obstetrics and Gynecology

## 2015-11-25 VITALS — BP 143/85 | HR 85 | Ht 66.0 in | Wt 180.9 lb

## 2015-11-25 DIAGNOSIS — E663 Overweight: Secondary | ICD-10-CM | POA: Diagnosis not present

## 2015-11-25 MED ORDER — CYANOCOBALAMIN 1000 MCG/ML IJ SOLN
1000.0000 ug | Freq: Once | INTRAMUSCULAR | Status: AC
  Administered 2015-11-25: 1000 ug via INTRAMUSCULAR

## 2015-11-25 NOTE — Progress Notes (Signed)
Patient ID: Mercedes Webb, female   DOB: February 03, 1956, 60 y.o.   MRN: [id number] Pt presents for weight, B/P, B-12 injection. No side effects of medication-Phentermine, or B-12.  Weight gain of  5 lbs. Encouraged eating healthy and exercise. Pt has been on vacation and forgot to take phentermine.

## 2015-12-23 ENCOUNTER — Ambulatory Visit (INDEPENDENT_AMBULATORY_CARE_PROVIDER_SITE_OTHER): Payer: 59 | Admitting: Obstetrics and Gynecology

## 2015-12-23 VITALS — BP 114/81 | HR 98 | Ht 66.0 in | Wt 178.3 lb

## 2015-12-23 DIAGNOSIS — R5383 Other fatigue: Secondary | ICD-10-CM | POA: Diagnosis not present

## 2015-12-23 MED ORDER — CYANOCOBALAMIN 1000 MCG/ML IJ SOLN
1000.0000 ug | Freq: Once | INTRAMUSCULAR | Status: AC
  Administered 2015-12-23: 1000 ug via INTRAMUSCULAR

## 2015-12-23 NOTE — Progress Notes (Signed)
Filed Weights   12/23/15 0844  Weight: 178 lb 4.8 oz (80.9 kg)   Pt presents for weight management, WT, BP, and B12 injection. PT today with a weight loss of 2lbs. Denies any adverse reaction to medication. States she is dieting but feels like "her hormones are out of whack" which is what is holding her weight loss back. PT nto follow up with provider in 4wks for reevaluation. PT given 15mL injection of B12 which she tolerated well.

## 2016-01-19 ENCOUNTER — Ambulatory Visit: Payer: 59 | Admitting: Obstetrics and Gynecology

## 2016-01-26 ENCOUNTER — Encounter: Payer: Self-pay | Admitting: Obstetrics and Gynecology

## 2016-01-26 ENCOUNTER — Ambulatory Visit (INDEPENDENT_AMBULATORY_CARE_PROVIDER_SITE_OTHER): Payer: 59 | Admitting: Obstetrics and Gynecology

## 2016-01-26 VITALS — BP 133/83 | HR 88 | Ht 67.0 in | Wt 182.3 lb

## 2016-01-26 DIAGNOSIS — Z79899 Other long term (current) drug therapy: Secondary | ICD-10-CM

## 2016-01-26 MED ORDER — PHENTERMINE HCL 37.5 MG PO TABS: 37.5000 mg | ORAL_TABLET | Freq: Every day | ORAL | 2 refills | Status: DC

## 2016-01-26 MED ORDER — ESTRADIOL 0.5 MG PO TABS: 0.5000 mg | ORAL_TABLET | Freq: Every day | ORAL | 4 refills | Status: DC

## 2016-01-26 MED ORDER — CYANOCOBALAMIN 1000 MCG/ML IJ SOLN: 1000.0000 ug | INTRAMUSCULAR | 1 refills | Status: DC

## 2016-01-26 NOTE — Patient Instructions (Signed)
Thank you for enrolling in [facility]. Please follow the instructions below to securely access your online medical record. MyChart allows you to send messages to your doctor, view your test results, renew your prescriptions, schedule appointments, and more.  How Do I Sign Up? 1. In your Internet browser, go to [email or web address]. 2. Click on the New  User? link in the Sign In box.  3. Enter your MyChart Access Code exactly as it appears below. You will not need to use this code after you have completed the sign-up process. If you do not sign up before the expiration date, you must request a new code. MyChart Access Code: [email or web address] Expires: 03/26/2016  9:00 AM  4. Enter the last four digits of your Social Security Number (xxxx) and Date of Birth (mm/dd/yyyy) as indicated and click Next. You will be taken to the next sign-up page. 5. Create a MyChart ID. This will be your MyChart login ID and cannot be changed, so think of one that is secure and easy to remember. 6. Create a MyChart password. You can change your password at any time. 7. Enter your Password Reset Question and Answer and click Next. This can be used at a later time if you forget your password.  8. Select your communication preference, and if applicable enter your e-mail address. You will receive e-mail notification when new information is available in MyChart by choosing to receive e-mail notifications and filling in your e-mail. 9. Click Sign In. You can now view your medical record.   Additional Information If you have questions, you can email [email or web address] or call [phone] to talk to our [facility] staff. Remember, MyChart is NOT to be used for urgent needs. For medical emergencies, dial 911.

## 2016-01-26 NOTE — Progress Notes (Signed)
SUBJECTIVE:  60 y.o. here for follow-up weight loss visit and medication management, previously seen 4 weeks ago. Feels like medication is not keeping her awake as bad, and progesterone is helping. Desires to continue and try to lose weight and do better. Initially lost 8# and gained it back. Also has reduced her estrogen dose and needs new rx for 0.5mg .  Does report a lot of stress at work due to [employer].   OBJECTIVE:  BP 133/83   Pulse 88   Ht 5\' 7"  (1.702 m)   Wt 182 lb 4.8 oz (82.7 kg)   BMI 28.55 kg/m   Body mass index is 28.55 kg/m. Patient appears well. ASSESSMENT:   1.Obesity 2.HRT PLAN:  To restart with current medications and will reduce estrogen . B12 1078mcg/ml injection given RTC in 4 weeks as planned  Petr Bontempo Parker, CNM

## 2016-01-27 LAB — PROGESTERONE: Progesterone: 4.5 ng/mL

## 2016-01-29 DIAGNOSIS — K859 Acute pancreatitis without necrosis or infection, unspecified: Secondary | ICD-10-CM

## 2016-01-29 HISTORY — DX: Acute pancreatitis without necrosis or infection, unspecified: K85.90

## 2016-01-30 ENCOUNTER — Telehealth: Payer: Self-pay | Admitting: *Deleted

## 2016-01-30 NOTE — Telephone Encounter (Signed)
-----   Message from Joylene Igo, [place] sent at 01/27/2016 11:58 AM EDT ----- Please let her know progesterone level is now normal

## 2016-01-30 NOTE — Telephone Encounter (Signed)
Notified pt. 

## 2016-01-31 ENCOUNTER — Emergency Department (HOSPITAL_COMMUNITY): Payer: 59

## 2016-01-31 ENCOUNTER — Inpatient Hospital Stay (HOSPITAL_COMMUNITY)
Admission: EM | Admit: 2016-01-31 | Discharge: 2016-02-03 | DRG: 444 | Disposition: A | Discharge status: Home / Self Care | Payer: 59 | Attending: Internal Medicine | Admitting: Internal Medicine

## 2016-01-31 ENCOUNTER — Encounter (HOSPITAL_COMMUNITY): Payer: Self-pay | Admitting: Emergency Medicine

## 2016-01-31 DIAGNOSIS — Z79899 Other long term (current) drug therapy: Secondary | ICD-10-CM | POA: Diagnosis not present

## 2016-01-31 DIAGNOSIS — I10 Essential (primary) hypertension: Secondary | ICD-10-CM | POA: Diagnosis present

## 2016-01-31 DIAGNOSIS — K8020 Calculus of gallbladder without cholecystitis without obstruction: Secondary | ICD-10-CM

## 2016-01-31 DIAGNOSIS — R160 Hepatomegaly, not elsewhere classified: Secondary | ICD-10-CM | POA: Diagnosis present

## 2016-01-31 DIAGNOSIS — K851 Biliary acute pancreatitis without necrosis or infection: Secondary | ICD-10-CM

## 2016-01-31 DIAGNOSIS — F411 Generalized anxiety disorder: Secondary | ICD-10-CM

## 2016-01-31 DIAGNOSIS — Z87410 Personal history of cervical dysplasia: Secondary | ICD-10-CM | POA: Diagnosis not present

## 2016-01-31 DIAGNOSIS — Z9049 Acquired absence of other specified parts of digestive tract: Secondary | ICD-10-CM | POA: Diagnosis not present

## 2016-01-31 DIAGNOSIS — K8050 Calculus of bile duct without cholangitis or cholecystitis without obstruction: Secondary | ICD-10-CM | POA: Diagnosis present

## 2016-01-31 DIAGNOSIS — K5710 Diverticulosis of small intestine without perforation or abscess without bleeding: Secondary | ICD-10-CM | POA: Diagnosis present

## 2016-01-31 DIAGNOSIS — Z85828 Personal history of other malignant neoplasm of skin: Secondary | ICD-10-CM | POA: Diagnosis not present

## 2016-01-31 DIAGNOSIS — E876 Hypokalemia: Secondary | ICD-10-CM | POA: Diagnosis present

## 2016-01-31 DIAGNOSIS — E1159 Type 2 diabetes mellitus with other circulatory complications: Secondary | ICD-10-CM

## 2016-01-31 DIAGNOSIS — Z8249 Family history of ischemic heart disease and other diseases of the circulatory system: Secondary | ICD-10-CM | POA: Diagnosis not present

## 2016-01-31 DIAGNOSIS — Z803 Family history of malignant neoplasm of breast: Secondary | ICD-10-CM

## 2016-01-31 DIAGNOSIS — Z833 Family history of diabetes mellitus: Secondary | ICD-10-CM

## 2016-01-31 DIAGNOSIS — I152 Hypertension secondary to endocrine disorders: Secondary | ICD-10-CM

## 2016-01-31 DIAGNOSIS — R0789 Other chest pain: Secondary | ICD-10-CM | POA: Diagnosis not present

## 2016-01-31 DIAGNOSIS — R748 Abnormal levels of other serum enzymes: Secondary | ICD-10-CM | POA: Diagnosis present

## 2016-01-31 DIAGNOSIS — K859 Acute pancreatitis without necrosis or infection, unspecified: Secondary | ICD-10-CM | POA: Diagnosis present

## 2016-01-31 DIAGNOSIS — K8051 Calculus of bile duct without cholangitis or cholecystitis with obstruction: Principal | ICD-10-CM | POA: Diagnosis present

## 2016-01-31 HISTORY — DX: Family history of other specified conditions: Z84.89

## 2016-01-31 HISTORY — DX: Headache: R51

## 2016-01-31 HISTORY — DX: Headache, unspecified: R51.9

## 2016-01-31 HISTORY — DX: Acute pancreatitis without necrosis or infection, unspecified: K85.90

## 2016-01-31 HISTORY — DX: Polyp of colon: K63.5

## 2016-01-31 LAB — I-STAT TROPONIN, ED: Troponin i, poc: 0 ng/mL (ref 0.00–0.08)

## 2016-01-31 LAB — CBC
HEMATOCRIT: 38 % (ref 36.0–46.0)
HEMOGLOBIN: 13 g/dL (ref 12.0–15.0)
MCH: 30.4 pg (ref 26.0–34.0)
MCHC: 34.2 g/dL (ref 30.0–36.0)
MCV: 89 fL (ref 78.0–100.0)
Platelets: 315 10*3/uL (ref 150–400)
RBC: 4.27 MIL/uL (ref 3.87–5.11)
RDW: 13.8 % (ref 11.5–15.5)
WBC: 14.5 10*3/uL — AB (ref 4.0–10.5)

## 2016-01-31 LAB — BASIC METABOLIC PANEL
Anion gap: 14 (ref 5–15)
BUN: 21 mg/dL — AB (ref 6–20)
CHLORIDE: 98 mmol/L — AB (ref 101–111)
CO2: 24 mmol/L (ref 22–32)
Calcium: 9.8 mg/dL (ref 8.9–10.3)
Creatinine, Ser: 1 mg/dL (ref 0.44–1.00)
GFR calc Af Amer: >60 mL/min (ref >60)
GFR, EST NON AFRICAN AMERICAN: 60 mL/min — AB (ref >60)
Glucose, Bld: 188 mg/dL — ABNORMAL HIGH (ref 65–99)
Potassium: 2.8 mmol/L — ABNORMAL LOW (ref 3.5–5.1)
Sodium: 136 mmol/L (ref 135–145)

## 2016-01-31 LAB — I-STAT CG4 LACTIC ACID, ED: Lactic Acid, Venous: 1.25 mmol/L (ref 0.5–1.9)

## 2016-01-31 LAB — MAGNESIUM: MAGNESIUM: 1.9 mg/dL (ref 1.7–2.4)

## 2016-01-31 LAB — HEPATIC FUNCTION PANEL
ALBUMIN: 3.6 g/dL (ref 3.5–5.0)
ALT: 478 U/L — ABNORMAL HIGH (ref 14–54)
AST: 218 U/L — ABNORMAL HIGH (ref 15–41)
Alkaline Phosphatase: 315 U/L — ABNORMAL HIGH (ref 38–126)
BILIRUBIN TOTAL: 6.6 mg/dL — AB (ref 0.3–1.2)
Bilirubin, Direct: 4.5 mg/dL — ABNORMAL HIGH (ref 0.1–0.5)
Indirect Bilirubin: 2.1 mg/dL — ABNORMAL HIGH (ref 0.3–0.9)
Total Protein: 7.3 g/dL (ref 6.5–8.1)

## 2016-01-31 LAB — LIPASE, BLOOD: LIPASE: 502 U/L — AB (ref 11–51)

## 2016-01-31 MED ORDER — IOPAMIDOL (ISOVUE-300) INJECTION 61%
INTRAVENOUS | Status: AC
Start: 2016-01-31 — End: 2016-01-31
  Administered 2016-01-31: 100 mL via INTRAVENOUS
  Filled 2016-01-31: qty 100

## 2016-01-31 MED ORDER — ONDANSETRON HCL 4 MG/2ML IJ SOLN
4.0000 mg | Freq: Four times a day (QID) | INTRAMUSCULAR | Status: DC | PRN
  Administered 2016-01-31 – 2016-02-01 (×2): 4 mg via INTRAVENOUS
  Filled 2016-01-31 (×3): qty 2

## 2016-01-31 MED ORDER — ONDANSETRON HCL 4 MG/2ML IJ SOLN
4.0000 mg | Freq: Once | INTRAMUSCULAR | Status: AC
  Administered 2016-01-31: 4 mg via INTRAVENOUS
  Filled 2016-01-31: qty 2

## 2016-01-31 MED ORDER — SODIUM CHLORIDE 0.9 % IV BOLUS (SEPSIS)
1000.0000 mL | Freq: Once | INTRAVENOUS | Status: AC
  Administered 2016-01-31: 1000 mL via INTRAVENOUS

## 2016-01-31 MED ORDER — POTASSIUM CHLORIDE CRYS ER 20 MEQ PO TBCR
40.0000 meq | EXTENDED_RELEASE_TABLET | Freq: Two times a day (BID) | ORAL | Status: AC
  Administered 2016-01-31 (×2): 40 meq via ORAL
  Filled 2016-01-31 (×2): qty 2

## 2016-01-31 MED ORDER — ONDANSETRON HCL 4 MG PO TABS: 4.0000 mg | ORAL_TABLET | Freq: Four times a day (QID) | ORAL | Status: DC | PRN

## 2016-01-31 MED ORDER — ENOXAPARIN SODIUM 40 MG/0.4ML ~~LOC~~ SOLN
40.0000 mg | SUBCUTANEOUS | Status: DC
  Administered 2016-01-31 – 2016-02-01 (×2): 40 mg via SUBCUTANEOUS
  Filled 2016-01-31 (×3): qty 0.4

## 2016-01-31 MED ORDER — MORPHINE SULFATE (PF) 4 MG/ML IV SOLN
4.0000 mg | Freq: Once | INTRAVENOUS | Status: AC
  Administered 2016-01-31: 4 mg via INTRAVENOUS
  Filled 2016-01-31: qty 1

## 2016-01-31 MED ORDER — SODIUM CHLORIDE 0.9 % IV SOLN
INTRAVENOUS | Status: DC
  Administered 2016-01-31 – 2016-02-02 (×6): via INTRAVENOUS
  Administered 2016-02-02: 100 mL via INTRAVENOUS
  Administered 2016-02-03: 01:00:00 via INTRAVENOUS

## 2016-01-31 MED ORDER — MORPHINE SULFATE (PF) 2 MG/ML IV SOLN
2.0000 mg | INTRAVENOUS | Status: DC | PRN
  Administered 2016-01-31 – 2016-02-02 (×5): 2 mg via INTRAVENOUS
  Filled 2016-01-31 (×5): qty 1

## 2016-01-31 MED ORDER — INFLUENZA VAC SPLIT QUAD 0.5 ML IM SUSY
0.5000 mL | PREFILLED_SYRINGE | INTRAMUSCULAR | Status: AC
  Administered 2016-02-01: 0.5 mL via INTRAMUSCULAR
  Filled 2016-01-31: qty 0.5

## 2016-01-31 MED ORDER — POTASSIUM CHLORIDE 10 MEQ/100ML IV SOLN
10.0000 meq | INTRAVENOUS | Status: AC
  Filled 2016-01-31 (×2): qty 100

## 2016-01-31 MED ORDER — TRIAMTERENE-HCTZ 37.5-25 MG PO TABS
1.0000 | ORAL_TABLET | Freq: Every day | ORAL | Status: DC
  Administered 2016-01-31 – 2016-02-03 (×4): 1 via ORAL
  Filled 2016-01-31 (×4): qty 1

## 2016-01-31 NOTE — H&P (Signed)
Date: 01/31/2016               Patient Name:  Mercedes Webb MRN: [id number]  DOB: 1956/01/13 Age / Sex: 60 y.o., female   PCP: Leonides Sake, MD         Medical Service: Internal Medicine Teaching Service         Attending Physician: Dr. Thayer Headings, MD    First Contact: Dr. Hetty Ely Pager: [phone]  Second Contact: Dr. Charlynn Grimes Pager: [phone]       After Hours (After 5p/  First Contact Pager: [phone]  weekends / holidays): Second Contact Pager: [phone]   Chief Complaint: nausea  History of Present Illness: 60 year old female with past medical history of cholecystectomy, hypertension, and anxiety who presents with 2 days of worsening nausea, vomiting, abdominal pain, and chest pain. Abdominal pain is a sharp shooting pain that goes to her back. Abdominal pain is located at bilateral lower quadrants. Her chest pain is a dull pain located in the mid epigastric region and radiates to both sides of her chest wall. Chest pain occurred after frequent vomiting and dry heaving. She denies blood in her vomit. She has not had a bowel movement in 5 day, her normal schedule is every other day. She has not been able to keep food down and has had low urine output. Her urine is a dark brown color. She has tried meclizine with minimal relief of nausea. She reports subjective fevers, night sweats, chills. She denies jaundice.  In the emergency department her lipase was found to be 502,  AST 218, ALT 478, alk phos 315, total bilirubin 6.6. CT of abdomen and pelvis revealed pancreatitis and mild dilatation of the common bile duct. It was negative for any stones. Currently patient is not having any nausea or abdominal pain after administration of Zofran and morphine. Gastroenterology consultation and recommended holding off on abdominal ultrasound pending possible MRCP.   Meds:  Current Meds  Medication Sig  . ALPRAZolam (XANAX) 1 MG tablet Take 1 mg by mouth 3 (three) times daily as needed for  anxiety.   . cyanocobalamin (,VITAMIN B-12,) 1000 MCG/ML injection Inject 1 mL (1,000 mcg total) into the muscle every 30 (thirty) days.  Marland Kitchen estradiol (ESTRACE) 0.5 MG tablet Take 1 tablet (0.5 mg total) by mouth daily.  Marland Kitchen ibuprofen (ADVIL,MOTRIN) 800 MG tablet Take 800 mg by mouth every 8 (eight) hours as needed for moderate pain.   . meclizine (ANTIVERT) 12.5 MG tablet Take 12.5 mg by mouth 3 (three) times daily as needed for dizziness or nausea.  . Melatonin (MELATONIN MAXIMUM STRENGTH) 5 MG TABS Take 5 mg by mouth at bedtime as needed (sleep).   . progesterone (PROMETRIUM) 200 MG capsule Take 1 capsule (200 mg total) by mouth daily.  Marland Kitchen triamterene-hydrochlorothiazide (MAXZIDE-25) 37.5-25 MG per tablet Take 1 tablet by mouth daily.   . valACYclovir (VALTREX) 500 MG tablet Take 500 mg by mouth daily.      Allergies: Allergies as of 01/31/2016  . (No Known Allergies)   Past Medical History:  Diagnosis Date  . Anxiety   . Cancer ([place]) 2016   Skin CA squamous cell on bilateral feet removed  . Cervical dysplasia    h/o  . Endometriosis   . Hypertension   . Insomnia   . Recurrent cold sores     Family History: Family History  Problem Relation Age of Onset  . Diabetes Father   . Heart disease Father   .  Breast cancer Sister 84  . Diabetes Brother   . Colon cancer Neg Hx   . Ovarian cancer Neg Hx     Social History: She lives with her brother. She does not smoke. She drinks socially, her last drink was one and a half weeks ago. She works at [employer].   Review of Systems: A complete ROS was negative except as per HPI.   Physical Exam: Blood pressure 163/70, pulse (!) 58, temperature 97.8 F (36.6 C), temperature source Oral, resp. rate 15, SpO2 98 %. Physical Exam  Constitutional: She appears well-developed and well-nourished. No distress.  Cardiovascular: Regular rhythm and normal heart sounds.  Exam reveals no gallop and no friction rub.   No murmur  heard. Pulmonary/Chest: Effort normal and breath sounds normal. No respiratory distress. She has no wheezes. She has no rales. She exhibits no tenderness.  Abdominal: Soft. Bowel sounds are normal. She exhibits no distension and no mass. There is tenderness (b/l lower quadrants). There is no rebound and no guarding.  Musculoskeletal: She exhibits no edema.  Skin: She is not diaphoretic.    EKG Interpretation  Date/Time:  Tuesday January 31 2016 06:18:07 EDT Ventricular Rate:  58 PR Interval:  138 QRS Duration: 92 QT Interval:  376 QTC Calculation: 369 R Axis:   35 Text Interpretation:  Sinus bradycardia Otherwise normal ECG No significant change since last tracing Confirmed by Maryan Rued  MD, Loree Fee ([id number]) on 01/31/2016 7:01:58 AM  CT abd/ pelvis Findings consistent with pancreatitis without pancreatic necrosis or pseudocyst formation.  Hepatomegaly and fatty infiltration of the liver.  Duodenal diverticulum.  Mild dilatation of the common bile duct may be related to cholecystectomy and the patient's age. No stone is seen within the duct and there is no intrahepatic biliary ductal dilatation.  Assessment & Plan by Problem: Active Problems:   Pancreatitis  Pancreatitis today to presumed choledocholithiasis-- pancreatitis noted on abd/ CT. She had a cholecystectomy in 2016 2/2 symptomatic gallstones. She is afebrile and not jaundiced. Her CBD is 1.2 cm. Her total bilirubin is 6.6 along w/ transaminitis and elevated alk phos. Thus she is considered intermediate risk for possibility of choledocholithiasis w/ a 10-50% chance of having a common bile duct stone. Likely will proceed with MRCP per GI, if this is negative she may have sphincter of dysfunction.  - admit to med surg - NPO except sips w/ meds - NS at 200 cc/hr - IV zofran prn - IV morphine 1m q4h prn for pain - GI consulted, appreciate their recommendations.  - CMET and CBC in the am  Hypokalemia- secondary to  vomiting. her potassium was 2.8. - Checking magnesium level - Repleted with K Dur 40 mEq twice a day  - CMET morning  Hypertension- continue home triamterene hydrochlorothiazide 37. 5-25 milligrams daily  Chest pain-- likely MSK 2/2 vomiting. Trop negative in the ED. EKG negative for ischemic changes.  - CTM   DVT ppx- lovenox Diet - NPO Code - FULL   Dispo: Admit patient to Inpatient with expected length of stay greater than 2 midnights.  Signed: DNorman Herrlich MD 01/31/2016, 10:10 AM  Pager: [phone]

## 2016-01-31 NOTE — ED Notes (Signed)
Ambulated pt to bathroom. Tolerated well 

## 2016-01-31 NOTE — ED Triage Notes (Signed)
Pt. reports right lower chest pain radiating to mid back with emesis onset Friday , denies diaphoresis , mild SOB , denies fever .

## 2016-01-31 NOTE — ED Notes (Signed)
Patient has returned from being out of the department; patient placed back on monitor, continuous pulse oximetry and blood pressure cuff; visitor at bedside 

## 2016-01-31 NOTE — ED Provider Notes (Signed)
[facility] DEPT Provider Note   CSN: [id number] Arrival date & time: 01/31/16  [id number]     History   Chief Complaint Chief Complaint  Patient presents with  . Chest Pain    HPI Mercedes Webb is a 60 y.o. female.  Patient is a 60 year old female with a history of prior cholecystectomy approximately one year ago presenting today with 2 days of vomiting, upper abdominal and chest discomfort and potential fever. She states 2 days ago she started having nausea and vomiting is been unable to hold anything down. She has not been passing flatus and no diarrhea. She states the pain in her abdomen is an 8 out of 10 and constant in nature. The pain does not radiate and hurts in the entire abdomen but worse in the upper quadrants. After multiple episodes of vomiting she is having discomfort in her back. However the pain does not radiate to her back at soreness in both sides of her back from the top to the bottom. She denies any cough, shortness of breath. No history of tobacco or alcohol use. No history of lung disease. She states this feels like it did when she had her gallbladder taken out.   The history is provided by the patient.    Past Medical History:  Diagnosis Date  . Anxiety   . Cancer ([place]) 2016   Skin CA squamous cell on bilateral feet removed  . Cervical dysplasia    h/o  . Endometriosis   . Hypertension   . Insomnia   . Recurrent cold sores     Patient Active Problem List   Diagnosis Date Noted  . Overweight 12/23/2014  . Surgical menopause 12/23/2014  . Status post abdominal hysterectomy 12/23/2014  . History of cervical dysplasia 12/23/2014  . SUI (stress urinary incontinence, female) 12/23/2014    Past Surgical History:  Procedure Laterality Date  . ABDOMINAL HYSTERECTOMY     tah/bso- endometriosis  . CHOLECYSTECTOMY    . tonsillectomy    . TUBAL LIGATION      OB History    Gravida Para Term Preterm AB Living   1         1   SAB TAB Ectopic Multiple  Live Births           1       Home Medications    Prior to Admission medications   Medication Sig Start Date End Date Taking? Authorizing Provider  ALPRAZolam Duanne Moron) 1 MG tablet Take 1 mg by mouth 3 (three) times daily as needed for anxiety.    Yes Historical Provider, MD  cyanocobalamin (,VITAMIN B-12,) 1000 MCG/ML injection Inject 1 mL (1,000 mcg total) into the muscle every 30 (thirty) days. 01/26/16  Yes Melody N Shambley, CNM  estradiol (ESTRACE) 0.5 MG tablet Take 1 tablet (0.5 mg total) by mouth daily. 01/26/16  Yes Melody N Shambley, CNM  ibuprofen (ADVIL,MOTRIN) 800 MG tablet Take 800 mg by mouth every 8 (eight) hours as needed for moderate pain.    Yes Historical Provider, MD  meclizine (ANTIVERT) 12.5 MG tablet Take 12.5 mg by mouth 3 (three) times daily as needed for dizziness or nausea.   Yes Historical Provider, MD  Melatonin (MELATONIN MAXIMUM STRENGTH) 5 MG TABS Take 5 mg by mouth at bedtime as needed (sleep).    Yes Historical Provider, MD  progesterone (PROMETRIUM) 200 MG capsule Take 1 capsule (200 mg total) by mouth daily. 09/29/15  Yes Melody N Shambley, CNM  triamterene-hydrochlorothiazide (MAXZIDE-25) 37.5-25  MG per tablet Take 1 tablet by mouth daily.    Yes Historical Provider, MD  valACYclovir (VALTREX) 500 MG tablet Take 500 mg by mouth daily.    Yes Historical Provider, MD  phentermine (ADIPEX-P) 37.5 MG tablet Take 1 tablet (37.5 mg total) by mouth daily before breakfast. Patient not taking: Reported on 01/31/2016 01/26/16   Melody Rockney Ghee, CNM    Family History Family History  Problem Relation Age of Onset  . Diabetes Father   . Heart disease Father   . Breast cancer Sister 28  . Diabetes Brother   . Colon cancer Neg Hx   . Ovarian cancer Neg Hx     Social History Social History  Substance Use Topics  . Smoking status: Former Smoker    Quit date: 04/30/1988  . Smokeless tobacco: Never Used  . Alcohol use Yes     Comment: occas     Allergies     Review of patient's allergies indicates no known allergies.   Review of Systems Review of Systems  All other systems reviewed and are negative.    Physical Exam Updated Vital Signs BP 148/72   Pulse (!) 59   Temp 97.8 F (36.6 C) (Oral)   Resp 19   SpO2 97%   Physical Exam  Constitutional: She is oriented to person, place, and time. She appears well-developed and well-nourished. No distress.  HENT:  Head: Normocephalic and atraumatic.  Mouth/Throat: Oropharynx is clear and moist. Mucous membranes are dry.  Eyes: Conjunctivae and EOM are normal. Pupils are equal, round, and reactive to light.  Neck: Normal range of motion. Neck supple.  Cardiovascular: Normal rate, regular rhythm and intact distal pulses.   No murmur heard. Pulmonary/Chest: Effort normal and breath sounds normal. No respiratory distress. She has no wheezes. She has no rales. She exhibits no tenderness.  Abdominal: Soft. She exhibits distension. Bowel sounds are decreased. There is tenderness in the right upper quadrant, periumbilical area and left upper quadrant. There is guarding and positive Murphy's sign. There is no rebound. No hernia.  Musculoskeletal: Normal range of motion. She exhibits no edema or tenderness.  Neurological: She is alert and oriented to person, place, and time.  Skin: Skin is warm and dry. No rash noted. No erythema.  Psychiatric: She has a normal mood and affect. Her behavior is normal.  Nursing note and vitals reviewed.    ED Treatments / Results  Labs (all labs ordered are listed, but only abnormal results are displayed) Labs Reviewed  BASIC METABOLIC PANEL - Abnormal; Notable for the following:       Result Value   Potassium 2.8 (*)    Chloride 98 (*)    Glucose, Bld 188 (*)    BUN 21 (*)    GFR calc non Af Amer 60 (*)    All other components within normal limits  CBC - Abnormal; Notable for the following:    WBC 14.5 (*)    All other components within normal limits   LIPASE, BLOOD - Abnormal; Notable for the following:    Lipase 502 (*)    All other components within normal limits  HEPATIC FUNCTION PANEL - Abnormal; Notable for the following:    AST 218 (*)    ALT 478 (*)    Alkaline Phosphatase 315 (*)    Total Bilirubin 6.6 (*)    Bilirubin, Direct 4.5 (*)    Indirect Bilirubin 2.1 (*)    All other components within normal limits  MAGNESIUM  COMPREHENSIVE METABOLIC PANEL  CBC  ETHANOL  LIPID PANEL  I-STAT TROPOININ, ED  I-STAT CG4 LACTIC ACID, ED    EKG  EKG Interpretation  Date/Time:  Tuesday January 31 2016 06:18:07 EDT Ventricular Rate:  58 PR Interval:  138 QRS Duration: 92 QT Interval:  376 QTC Calculation: 369 R Axis:   35 Text Interpretation:  Sinus bradycardia Otherwise normal ECG No significant change since last tracing Confirmed by Maryan Rued  MD, Loree Fee ([id number]) on 01/31/2016 7:01:58 AM       Radiology Dg Chest 2 View  Result Date: 01/31/2016 CLINICAL DATA:  Nausea and vomiting with upper abdominal and back pain for the past 2 days EXAM: CHEST  2 VIEW COMPARISON:  None in PACs FINDINGS: The lungs are adequately inflated. There is linear increased density at both lung bases greatest in the right middle lobe. There is subtle increased density peripherally in the left upper lobe which overlies the anterior aspect of the second rib. The heart and pulmonary vascularity are normal. The mediastinum is normal in width. There is no pleural effusion. The bony thorax is unremarkable. IMPRESSION: Bibasilar subsegmental atelectasis or early interstitial infiltrate greatest in the right middle lobe. No alveolar pneumonia or CHF. Subtle increased density projects over the anterior aspect of the left second rib on the frontal radiograph but is not evident on the lateral view. Followup PA and lateral chest X-ray is recommended in 3-4 weeks following trial of antibiotic therapy to ensure resolution and exclude underlying malignancy. Electronically  Signed   By: David  Martinique M.D.   On: 01/31/2016 07:22   Ct Abdomen Pelvis W Contrast  Result Date: 01/31/2016 CLINICAL DATA:  Abdominal pain, bloating, nausea and vomiting for 3 days. EXAM: CT ABDOMEN AND PELVIS WITH CONTRAST TECHNIQUE: Multidetector CT imaging of the abdomen and pelvis was performed using the standard protocol following bolus administration of intravenous contrast. CONTRAST:  100 ISOVUE-300 IOPAMIDOL (ISOVUE-300) INJECTION 61% COMPARISON:  None. FINDINGS: Lower chest: Mild dependent atelectasis is seen. Scar or atelectasis in the inferior right middle lobe is noted. No pleural or pericardial effusion. Heart size is upper normal. Hepatobiliary: The liver is low attenuating consistent with fatty infiltration. The liver measures 26 cm craniocaudal. The gallbladder has been removed. The common bile duct is mildly dilated at 1.2 cm. Pancreas: Diffuse stranding is present about the pancreas. The pancreas enhances homogeneously. No focal fluid collection is seen. Spleen: Unremarkable. Adrenals/Urinary Tract: Unremarkable. Stomach/Bowel: Duodenal diverticulum is noted. Otherwise unremarkable. Vascular/Lymphatic: A few atherosclerotic calcifications are identified. Reproductive: Status post hysterectomy and tubal ligation. Other: Unremarkable. Musculoskeletal: Unremarkable. IMPRESSION: Findings consistent with pancreatitis without pancreatic necrosis or pseudocyst formation. Hepatomegaly and fatty infiltration of the liver. Duodenal diverticulum. Mild dilatation of the common bile duct may be related to cholecystectomy and the patient's age. No stone is seen within the duct and there is no intrahepatic biliary ductal dilatation. Electronically Signed   By: Inge Rise M.D.   On: 01/31/2016 09:30    Procedures Procedures (including critical care time)  Medications Ordered in ED Medications  sodium chloride 0.9 % bolus 1,000 mL (not administered)  morphine 4 MG/ML injection 4 mg (not  administered)  ondansetron (ZOFRAN) injection 4 mg (not administered)  potassium chloride 10 mEq in 100 mL IVPB (not administered)     Initial Impression / Assessment and Plan / ED Course  I have reviewed the triage vital signs and the nursing notes.  Pertinent labs & imaging results that were available during my care  of the patient were reviewed by me and considered in my medical decision making (see chart for details).  Clinical Course   Patient is a 60 year old female presenting today with 2 days of vomiting, abdominal and chest pain. The abdominal pain is diffuse but worse in the upper quadrants. She has a history of prior cholecystectomy last year and at that time had elevated LFTs. She denies any history of alcohol or excessive NSAID use. She denies any blood in her emesis.  She has had no cough, congestion or shortness of breath. When patient states she has chest pain it's her lower bilateral ribs which she attributes to repetitive vomiting. She has no URI symptoms at this time. EKG is within normal limits, troponin is negative and chest x-ray shows some atelectasis but she has no symptoms suggestive of pneumonia. Symptoms are not classic for PE. Patient does have tenderness throughout her abdomen on palpation and does have a white count of 14,000 today with a potassium of 2.8. Hypokalemia is most likely related to repetitive vomiting. Concern for possible choledocholithiasis, transaminitis, pancreatitis. Also concern for potential obstruction. LFTs, lipase and CT of the abdomen and pelvis ordered. Lactic acid pending. Patient given IV fluids, pain and nausea medication.  9:44 AM Lactic acid within normal limits. LFTs elevated today at AST of 218 and a LT of 478. Total bili of 6.6. Lipase is still pending but CT shows pancreatitis. Common bile duct is dilated on CT but they do not show stone however with elevated T bili and LFTs concern for potential retained stone. We'll discuss with GI.  Patient is feeling much better after pain medication.  Final Clinical Impressions(s) / ED Diagnoses   Final diagnoses:  Choledocholithiasis  Acute biliary pancreatitis without infection or necrosis    New Prescriptions New Prescriptions   No medications on file     Blanchie Dessert, MD 01/31/16 2222

## 2016-01-31 NOTE — ED Notes (Signed)
Pt at XRAY

## 2016-01-31 NOTE — Consult Note (Signed)
Ms. Tessmann is a 60 yo female with the onset of RUQ abdominal pain 48 hours ago.  This radiates throughout the abdomen, into the right flank, and chest. She subsequently developed nausea and vomiting, without hematemesis.  Low-grade fever of 100, without rigor, but with chills, developed.  No bowel movement for the past several days. Dark urine and orthostatic weakness followed, for which she came to the ED.  Evaluation found transaminitis, elevated bilirubin and alkaline phosphatase, and elevated amylase and lipase.  Abdominal CT confirms pancreatic inflammation, along with mild CBD dilatation, without intrahepatic ductal dilation or choledocholithiasis identified.  She has a mild leucocytosis without hemoconcentration, acidosis, or renal insufficiency.  Past history is significant for symptomatic cholelithiasis leading to lap cholecystectomy 2016, which was extremely difficult due to inflammation.  Her post-op course was uncomplicated.  Her alcohol consumption is minimal, occasional social drink, none for 2 weeks prior to her recent illness.  She takes triamterene/HCTZ for hypertension.  She believes her triglycerides may be elevated, but not on any medication for this.  Denies trauma to the abdomen.  She has been using ibuprofen 800 mg for shoulder pain.  This has been injected previously but NSAID's recommended for ongoing arthralgias.  She does not associate this with her current symptoms.  She take estrogen to which progesterone was recently added.  She was last seen by me, 02/07/2015 for screening colonoscopy, which was normal.  Repeat was recommended in 10 years.  Past Surgical Hx: Tonsillectomy Partial hysterectomy Full hysterectomy Knee surgery Cholecystectomy  Family Hx: COPD Asthma Diabetes Heart disease Hypothyroid Breast cancer  Exam: General - healthy, NAD, resting comfortably, alert, oriented x 3 HEENT - mildly icteric sclerae, dry mouth Neck - No bruit or adenopathy Chest -  clear, slight bibasilar crackles Cor - regular, NSR, no murmur Abdomen - absent bowel sounds.  Non-distended.  Tender RUQ without rebound or guarding.  Fullness in the RUQ without discrete hepar edge.  No splenomegaly.  No Cullen or Grey-Turner sign.  No ascites detected. Extremeties - without clubbing, cyanosis.  Poor skin turgor.  Labs/X-ray reviewed.  Assessment: Acute pancreatitis, without organ failure, mild dehydration which is being corrected.  Presently stable.  Etiology is most likely gallstone induced with prior history of gallstone disease, dilated CBD, elevated LFT's and pancreatic enzymes.  Further evaluation with MRCP to better delineate CBD stone is needed.  Pending results and her clinical course, ERCP will likely be needed.  I have reviewed this with Dr. Charlynn Grimes, attending MD.  I have asked Dr. Carol Ada to take over GI consultation since I do not perform ERCP.  He has agreed to do so.  Additional considerations include possible hypertriglyceridemia or medication induced pancreatitis, e.g. HCTZ.  Penetrating PUD, on ibuprofen is also possible, though less likely.    Recommendation: Agree with aggressive rehydration, careful monitoring PPI coverage Avoid medications with pancreatitis as known adverse reaction Monitor labs daily, check lipid profile, CRP Call if I can be of further help at:   [phone]

## 2016-01-31 NOTE — ED Notes (Signed)
[facility] CT Tech transport pt to CT. Pt stable. NAD noted.

## 2016-02-01 ENCOUNTER — Inpatient Hospital Stay (HOSPITAL_COMMUNITY): Payer: 59

## 2016-02-01 DIAGNOSIS — E876 Hypokalemia: Secondary | ICD-10-CM

## 2016-02-01 LAB — COMPREHENSIVE METABOLIC PANEL WITH GFR
ALT: 282 U/L — ABNORMAL HIGH (ref 14–54)
AST: 82 U/L — ABNORMAL HIGH (ref 15–41)
Albumin: 2.8 g/dL — ABNORMAL LOW (ref 3.5–5.0)
Alkaline Phosphatase: 243 U/L — ABNORMAL HIGH (ref 38–126)
Anion gap: 6 (ref 5–15)
BUN: 10 mg/dL (ref 6–20)
CO2: 26 mmol/L (ref 22–32)
Calcium: 8.4 mg/dL — ABNORMAL LOW (ref 8.9–10.3)
Chloride: 105 mmol/L (ref 101–111)
Creatinine, Ser: 0.85 mg/dL (ref 0.44–1.00)
GFR calc Af Amer: >60 mL/min
GFR calc non Af Amer: >60 mL/min
Glucose, Bld: 118 mg/dL — ABNORMAL HIGH (ref 65–99)
Potassium: 3.1 mmol/L — ABNORMAL LOW (ref 3.5–5.1)
Sodium: 137 mmol/L (ref 135–145)
Total Bilirubin: 2.2 mg/dL — ABNORMAL HIGH (ref 0.3–1.2)
Total Protein: 6.1 g/dL — ABNORMAL LOW (ref 6.5–8.1)

## 2016-02-01 LAB — CBC
HCT: 33.7 % — ABNORMAL LOW (ref 36.0–46.0)
Hemoglobin: 11 g/dL — ABNORMAL LOW (ref 12.0–15.0)
MCH: 30.1 pg (ref 26.0–34.0)
MCHC: 32.6 g/dL (ref 30.0–36.0)
MCV: 92.1 fL (ref 78.0–100.0)
Platelets: 242 10*3/uL (ref 150–400)
RBC: 3.66 MIL/uL — ABNORMAL LOW (ref 3.87–5.11)
RDW: 14 % (ref 11.5–15.5)
WBC: 10 10*3/uL (ref 4.0–10.5)

## 2016-02-01 LAB — LIPID PANEL
CHOL/HDL RATIO: 4.3 ratio
Cholesterol: 182 mg/dL (ref 0–200)
HDL: 42 mg/dL (ref >40)
LDL CALC: 106 mg/dL — AB (ref 0–99)
Triglycerides: 170 mg/dL — ABNORMAL HIGH (ref <150)
VLDL: 34 mg/dL (ref 0–40)

## 2016-02-01 LAB — ETHANOL: Alcohol, Ethyl (B): <5 mg/dL

## 2016-02-01 MED ORDER — GADOBENATE DIMEGLUMINE 529 MG/ML IV SOLN
20.0000 mL | Freq: Once | INTRAVENOUS | Status: AC
  Administered 2016-02-01: 20 mL via INTRAVENOUS

## 2016-02-01 MED ORDER — POTASSIUM CHLORIDE CRYS ER 20 MEQ PO TBCR
40.0000 meq | EXTENDED_RELEASE_TABLET | Freq: Two times a day (BID) | ORAL | Status: DC
  Administered 2016-02-01 – 2016-02-03 (×5): 40 meq via ORAL
  Filled 2016-02-01 (×5): qty 2

## 2016-02-01 NOTE — Progress Notes (Signed)
Pt potassium 3.1, MD notified at [phone].

## 2016-02-01 NOTE — Progress Notes (Signed)
I relayed the results of the MRCP with the patient.  She has multiple stones in the CBD.  I believe these were present at the time of the cholecystectomy on 05/2014.  An IOC was not performed at that time.  She is scheduled for an ERCP on 02/03/2016 at 1 PM.  I was not able to obtain an earlier time as all the anesthesia slots were filled for Thursday.  She is feeling better at this time and I think it is fine to try her on clear liquids.  If it exacerbates her pain, she knows to stop drinking the liquids.

## 2016-02-01 NOTE — Progress Notes (Signed)
Subjective: Feeling better today.    Objective: Vital signs in last 24 hours: Temp:  [98 F (36.7 C)-99.5 F (37.5 C)] 98.7 F (37.1 C) (10/04 0609) Pulse Rate:  [55-75] 71 (10/04 0609) Resp:  [13-20] 18 (10/04 0609) BP: (123-150)/(54-79) 136/56 (10/04 0609) SpO2:  [92 %-99 %] 95 % (10/04 0609) Weight:  [82.7 kg (182 lb 4.8 oz)] 82.7 kg (182 lb 4.8 oz) (10/03 1553) Last BM Date: 01/27/16  Intake/Output from previous day: 10/03 0701 - 10/04 0700 In: 3960 [I.V.:2960; IV Piggyback:1000] Out: 900 [Urine:900] Intake/Output this shift: No intake/output data recorded.  General appearance: alert and no distress GI: mild epigastric tenderness  Lab Results:  Recent Labs  01/31/16 0628 02/01/16 0505  WBC 14.5* 10.0  HGB 13.0 11.0*  HCT 38.0 33.7*  PLT 315 242   BMET  Recent Labs  01/31/16 0628 02/01/16 0505  NA 136 137  K 2.8* 3.1*  CL 98* 105  CO2 24 26  GLUCOSE 188* 118*  BUN 21* 10  CREATININE 1.00 0.85  CALCIUM 9.8 8.4*   LFT  Recent Labs  01/31/16 0823 02/01/16 0505  PROT 7.3 6.1*  ALBUMIN 3.6 2.8*  AST 218* 82*  ALT 478* 282*  ALKPHOS 315* 243*  BILITOT 6.6* 2.2*  BILIDIR 4.5*  --   IBILI 2.1*  --    PT/INR No results for input(s): LABPROT, INR in the last 72 hours. Hepatitis Panel No results for input(s): HEPBSAG, HCVAB, HEPAIGM, HEPBIGM in the last 72 hours. C-Diff No results for input(s): CDIFFTOX in the last 72 hours. Fecal Lactopherrin No results for input(s): FECLLACTOFRN in the last 72 hours.  Studies/Results: Dg Chest 2 View  Result Date: 01/31/2016 CLINICAL DATA:  Nausea and vomiting with upper abdominal and back pain for the past 2 days EXAM: CHEST  2 VIEW COMPARISON:  None in PACs FINDINGS: The lungs are adequately inflated. There is linear increased density at both lung bases greatest in the right middle lobe. There is subtle increased density peripherally in the left upper lobe which overlies the anterior aspect of the second rib.  The heart and pulmonary vascularity are normal. The mediastinum is normal in width. There is no pleural effusion. The bony thorax is unremarkable. IMPRESSION: Bibasilar subsegmental atelectasis or early interstitial infiltrate greatest in the right middle lobe. No alveolar pneumonia or CHF. Subtle increased density projects over the anterior aspect of the left second rib on the frontal radiograph but is not evident on the lateral view. Followup PA and lateral chest X-ray is recommended in 3-4 weeks following trial of antibiotic therapy to ensure resolution and exclude underlying malignancy. Electronically Signed   By: David  Martinique M.D.   On: 01/31/2016 07:22   Ct Abdomen Pelvis W Contrast  Result Date: 01/31/2016 CLINICAL DATA:  Abdominal pain, bloating, nausea and vomiting for 3 days. EXAM: CT ABDOMEN AND PELVIS WITH CONTRAST TECHNIQUE: Multidetector CT imaging of the abdomen and pelvis was performed using the standard protocol following bolus administration of intravenous contrast. CONTRAST:  100 ISOVUE-300 IOPAMIDOL (ISOVUE-300) INJECTION 61% COMPARISON:  None. FINDINGS: Lower chest: Mild dependent atelectasis is seen. Scar or atelectasis in the inferior right middle lobe is noted. No pleural or pericardial effusion. Heart size is upper normal. Hepatobiliary: The liver is low attenuating consistent with fatty infiltration. The liver measures 26 cm craniocaudal. The gallbladder has been removed. The common bile duct is mildly dilated at 1.2 cm. Pancreas: Diffuse stranding is present about the pancreas. The pancreas enhances homogeneously. No focal  fluid collection is seen. Spleen: Unremarkable. Adrenals/Urinary Tract: Unremarkable. Stomach/Bowel: Duodenal diverticulum is noted. Otherwise unremarkable. Vascular/Lymphatic: A few atherosclerotic calcifications are identified. Reproductive: Status post hysterectomy and tubal ligation. Other: Unremarkable. Musculoskeletal: Unremarkable. IMPRESSION: Findings  consistent with pancreatitis without pancreatic necrosis or pseudocyst formation. Hepatomegaly and fatty infiltration of the liver. Duodenal diverticulum. Mild dilatation of the common bile duct may be related to cholecystectomy and the patient's age. No stone is seen within the duct and there is no intrahepatic biliary ductal dilatation. Electronically Signed   By: Inge Rise M.D.   On: 01/31/2016 09:30    Medications:  Scheduled: . enoxaparin (LOVENOX) injection  40 mg Subcutaneous Q24H  . Influenza vac split quadrivalent PF  0.5 mL Intramuscular Tomorrow-1000  . potassium chloride  40 mEq Oral BID  . triamterene-hydrochlorothiazide  1 tablet Oral Daily   Continuous: . sodium chloride 200 mL/hr at 02/01/16 [id number]    Assessment/Plan: 1) Gallstone pancreatitis. 2) Dilated CBD. 3) Abnormal liver enzymes.   I discussed the case with Dr. Earlean Shawl yesterday.  Her blood work has markedly improved in that her liver enzymes have dropped down as well as her TB.  I cannot know if she passed a stone versus the stone dislodged.  I will pursue an MRCP.  Hopefully she has cleared the stone.  Symptomatically she is feeling better and she has not required significant pain control.  She was nauseated earlier this AM.    Plan: 1) Await MRCP.  If there is a stone I will pursue the ERCP with stone extraction. 2) Follow liver panel. 3) Continue with aggressive IV hydration and replace electrolytes.  LOS: 1 day   Brownie Nehme D 02/01/2016, 8:37 AM

## 2016-02-01 NOTE — Progress Notes (Signed)
Subjective: [name] . Some abdominal pain over night however she feels that it is overall improving. Epigastric pain that she was experiencing has improved almost completely however she is continuing to experience back pain. She does feel some nausea but denied any new episodes of vomiting.  Objective:  Vital signs in last 24 hours: Vitals:   01/31/16 1523 01/31/16 1553 01/31/16 2147 02/01/16 0609  BP: (!) 143/67  (!) 123/54 (!) 136/56  Pulse: 68  68 71  Resp: 19  18 18   Temp: 98 F (36.7 C)  99.5 F (37.5 C) 98.7 F (37.1 C)  TempSrc: Oral   Oral  SpO2: 98%  92% 95%  Weight:  182 lb 4.8 oz (82.7 kg)    Height:  5\' 7"  (1.702 m)     Physical Exam  Constitutional: She appears well-developed and well-nourished. No distress.  Cardiovascular: Normal rate and regular rhythm.   No murmur heard. Pulmonary/Chest: Effort normal. She has no wheezes. She has no rales.  Abdominal: Soft. She exhibits no distension. There is no tenderness.  Extremities: no calf tenderness, no peripheral edema   Labs: CBC:  Recent Labs Lab 01/31/16 0628 02/01/16 0505  WBC 14.5* 10.0  HGB 13.0 11.0*  HCT 38.0 33.7*  MCV 89.0 92.1  PLT [id number]   Metabolic Panel:  Recent Labs Lab 01/31/16 0628 01/31/16 0823 01/31/16 1039 02/01/16 0505  NA 136  --   --  137  K 2.8*  --   --  3.1*  CL 98*  --   --  105  CO2 24  --   --  26  GLUCOSE 188*  --   --  118*  BUN 21*  --   --  10  CREATININE 1.00  --   --  0.85  CALCIUM 9.8  --   --  8.4*  MG  --   --  1.9  --   ALT  --  478*  --  282*  ALKPHOS  --  315*  --  243*  BILITOT  --  6.6*  --  2.2*  PROT  --  7.3  --  6.1*  ALBUMIN  --  3.6  --  2.8*  LIPASE  --  502*  --   --     Medications: Infusions: . sodium chloride 200 mL/hr at 02/01/16 [id number]   Scheduled Medications: . enoxaparin (LOVENOX) injection  40 mg Subcutaneous Q24H  . potassium chloride  40 mEq Oral BID  . triamterene-hydrochlorothiazide  1 tablet Oral Daily    PRN Medications: morphine injection, ondansetron **OR** ondansetron (ZOFRAN) IV  Assessment/Plan: Principal Problem:   Acute biliary pancreatitis Active Problems:   Essential hypertension   Generalized anxiety disorder   Hypokalemia  Acute biliary pancreatitis  Pt is a 61 y.o. yo female with a PMHx of cholecystectomy, hypertension, and anxiety who was admitted on 01/31/2016 with symptoms of abominant pain nausea and vomiting. In the ED she was found to have elevated lipase, transaminase, and total bilirubin CT abdomen showed an inflamed pancreas and common bile duct dilation. Her pain has showed improvement with NPO and pain management. In the setting of these clinical findings her abdominal pain is most likely related to acute pancreatitis secondary to common bile duct obstruction. We will keep her NPO with IV fluids NS 200 ml/hr and continue using morphine to control her pain and nausea to control her nausea The plan is for MRCP tomorrow morning by Dr. Benson Norway.  Hypokalemia, improving  We will continue K DUR 40 meq BID   Hypertension  Her blood pressure remains controled on home medication traamterene- HCTZ.   Dispo: Anticipated discharge in approximately 1-2 day(s).   LOS: 1 day   Mercedes Noss, MD 02/01/2016, 11:29 AM Pager: [phone]

## 2016-02-02 LAB — BASIC METABOLIC PANEL
ANION GAP: 7 (ref 5–15)
BUN: 8 mg/dL (ref 6–20)
CALCIUM: 9.1 mg/dL (ref 8.9–10.3)
CO2: 25 mmol/L (ref 22–32)
Chloride: 106 mmol/L (ref 101–111)
Creatinine, Ser: 0.77 mg/dL (ref 0.44–1.00)
Glucose, Bld: 108 mg/dL — ABNORMAL HIGH (ref 65–99)
Potassium: 3.5 mmol/L (ref 3.5–5.1)
Sodium: 138 mmol/L (ref 135–145)

## 2016-02-02 NOTE — Progress Notes (Signed)
   Subjective: [name] has had significant improvement in her abdominal pain overnight and says that it has almost subsided completely at this point. At this point her worst pain is over her lower back. She's not required morphine since yesterday evening. She has tolerated liquid diet and experienced only minimal nausea and vomiting.   Objective:  Vital signs in last 24 hours: Vitals:   02/01/16 0945 02/01/16 1508 02/01/16 2115 02/02/16 0614  BP: 128/60 (!) 129/54 (!) 135/57 (!) 139/55  Pulse: 60 72 75 77  Resp:  16 18 18   Temp:  98.4 F (36.9 C) 98.3 F (36.8 C) 98.1 F (36.7 C)  TempSrc:  Oral Oral Oral  SpO2:  100% 96% 96%  Weight:      Height:       Physical Exam  Constitutional: She appears well-developed and well-nourished. No distress.  Cardiovascular: Normal rate and regular rhythm.   No murmur heard. Pulmonary/Chest: Breath sounds normal. She has no wheezes. She has no rales.  Abdominal: Soft. She exhibits no distension. There is no tenderness.  Extremities: no calf tenderness, no peripheral edema   Medications: Infusions: . sodium chloride 100 mL/hr at 02/02/16 0138   Scheduled Medications: . potassium chloride  40 mEq Oral BID  . triamterene-hydrochlorothiazide  1 tablet Oral Daily   PRN Medications: morphine injection, ondansetron **OR** ondansetron (ZOFRAN) IV  Assessment/Plan: Principal Problem:   Acute biliary pancreatitis Active Problems:   Essential hypertension   Generalized anxiety disorder   Hypokalemia  Acute biliary pancreatitis Mercedes Webb presented with biliary pancreatitis s/p cholecystectomy. MRCP performed yesterday revealed multiple stones within the common bile duct. This finding along with her elevated lipase, transaminase, and bilirubin make biliary obstruction and acute pancreatitis the most likely cause of her abdominal pain. Her pain has resolved almost completely since admission, this may be related to her bowel rest it is  also possible that the stone obstructing the common bile duct on presentation has passed. We will continue clear liquid diet, IV fluids, and continue using IV morphine to control her pain and zofran to control her nausea. The plan is for ERCP tomorrow. Dr. Benson Norway is following, we appreciate his recommendations.   Hypokalemia This has improved with potassium 40 meq BID   Hypertension  Her blood pressure remains well controlled with her home dose of triamterene-HCTZ.   Dispo: Anticipated discharge in approximately 1-2 day(s).    LOS: 2 days   Ledell Noss, MD 02/02/2016, 10:38 AM Pager: [phone]

## 2016-02-02 NOTE — Progress Notes (Signed)
Subjective: Continues to feel better.  Back pain last evening similar to her pancreatitis back pain.  Tolerated liquids without any problems.  Objective: Vital signs in last 24 hours: Temp:  [98.1 F (36.7 C)-98.4 F (36.9 C)] 98.1 F (36.7 C) (10/05 0614) Pulse Rate:  [60-77] 77 (10/05 0614) Resp:  [16-18] 18 (10/05 0614) BP: (128-139)/(54-60) 139/55 (10/05 0614) SpO2:  [96 %-100 %] 96 % (10/05 0614) Last BM Date: 01/27/16  Intake/Output from previous day: 10/04 0701 - 10/05 0700 In: 1000 [I.V.:1000] Out: 1000 [Urine:1000] Intake/Output this shift: No intake/output data recorded.  General appearance: alert and no distress GI: minimal epigastric tenderness  Lab Results:  Recent Labs  01/31/16 0628 02/01/16 0505  WBC 14.5* 10.0  HGB 13.0 11.0*  HCT 38.0 33.7*  PLT 315 242   BMET  Recent Labs  01/31/16 0628 02/01/16 0505  NA 136 137  K 2.8* 3.1*  CL 98* 105  CO2 24 26  GLUCOSE 188* 118*  BUN 21* 10  CREATININE 1.00 0.85  CALCIUM 9.8 8.4*   LFT  Recent Labs  01/31/16 0823 02/01/16 0505  PROT 7.3 6.1*  ALBUMIN 3.6 2.8*  AST 218* 82*  ALT 478* 282*  ALKPHOS 315* 243*  BILITOT 6.6* 2.2*  BILIDIR 4.5*  --   IBILI 2.1*  --    PT/INR No results for input(s): LABPROT, INR in the last 72 hours. Hepatitis Panel No results for input(s): HEPBSAG, HCVAB, HEPAIGM, HEPBIGM in the last 72 hours. C-Diff No results for input(s): CDIFFTOX in the last 72 hours. Fecal Lactopherrin No results for input(s): FECLLACTOFRN in the last 72 hours.  Studies/Results: Ct Abdomen Pelvis W Contrast  Result Date: 01/31/2016 CLINICAL DATA:  Abdominal pain, bloating, nausea and vomiting for 3 days. EXAM: CT ABDOMEN AND PELVIS WITH CONTRAST TECHNIQUE: Multidetector CT imaging of the abdomen and pelvis was performed using the standard protocol following bolus administration of intravenous contrast. CONTRAST:  100 ISOVUE-300 IOPAMIDOL (ISOVUE-300) INJECTION 61% COMPARISON:  None.  FINDINGS: Lower chest: Mild dependent atelectasis is seen. Scar or atelectasis in the inferior right middle lobe is noted. No pleural or pericardial effusion. Heart size is upper normal. Hepatobiliary: The liver is low attenuating consistent with fatty infiltration. The liver measures 26 cm craniocaudal. The gallbladder has been removed. The common bile duct is mildly dilated at 1.2 cm. Pancreas: Diffuse stranding is present about the pancreas. The pancreas enhances homogeneously. No focal fluid collection is seen. Spleen: Unremarkable. Adrenals/Urinary Tract: Unremarkable. Stomach/Bowel: Duodenal diverticulum is noted. Otherwise unremarkable. Vascular/Lymphatic: A few atherosclerotic calcifications are identified. Reproductive: Status post hysterectomy and tubal ligation. Other: Unremarkable. Musculoskeletal: Unremarkable. IMPRESSION: Findings consistent with pancreatitis without pancreatic necrosis or pseudocyst formation. Hepatomegaly and fatty infiltration of the liver. Duodenal diverticulum. Mild dilatation of the common bile duct may be related to cholecystectomy and the patient's age. No stone is seen within the duct and there is no intrahepatic biliary ductal dilatation. Electronically Signed   By: Inge Rise M.D.   On: 01/31/2016 09:30   Mr 3d Recon At Scanner  Result Date: 02/01/2016 CLINICAL DATA:  Choledocholithiasis. Abdominal pain and pancreatitis. Mildly dilated common bile duct. EXAM: MRI ABDOMEN WITHOUT AND WITH CONTRAST (INCLUDING MRCP) TECHNIQUE: Multiplanar multisequence MR imaging of the abdomen was performed both before and after the administration of intravenous contrast. Heavily T2-weighted images of the biliary and pancreatic ducts were obtained, and three-dimensional MRCP images were rendered by post processing. CONTRAST:  19mL MULTIHANCE GADOBENATE DIMEGLUMINE 529 MG/ML IV SOLN COMPARISON:  01/31/2016  FINDINGS: Lower chest: Linear atelectasis in the right lower lobe and right  middle lobe. Hepatobiliary: Hepatomegaly, craniocaudad excursion liver 26.5 cm, although this elongation primarily involves the right hepatic lobe. There about 6 adjacent filling defects within the common bile duct, each measuring about 5-10 mm in long axis, as shown on images 26-32 of series 9, associated with mild CBD dilatation to 9 mm. Conical tapering of the distal CBD as shown on image 26/9. This empties into the vicinity of a periampullary duodenal diverticulum. Diffuse hepatic steatosis. Cholecystectomy. No significant in intrahepatic biliary dilatation. No significant abnormal hepatic parenchymal enhancement. Pancreas: Mild peripancreatic stranding, no pancreatic mass identified. Spleen:  Unremarkable Adrenals/Urinary Tract:  Unremarkable Stomach/Bowel: Small periampullary duodenal diverticulum. Vascular/Lymphatic:  Reactive small porta hepatis lymph nodes. Other: Mild edema tracks in the mesentery and along the left anterior perirenal fascia margin. This is likely related to pancreatitis. Musculoskeletal: Unremarkable IMPRESSION: 1. Choledocholithiasis, with at least 6 stones in the 5-10 mm long axis range filling a 5 cm segment of the CBD. The distal CBD tapers in a conical fashion towards the ampulla. There is a small periampullary duodenal diverticulum. CBD caliber 9 mm. 2. Diffuse hepatic steatosis.  Elongated right hepatic lobe. 3. Mild right basilar atelectasis. 4. Acute pancreatitis with peripancreatic stranding tracking in the mesentery. No findings of pancreatic necrosis or dilatation of the dorsal pancreatic duct. Electronically Signed   By: Van Clines M.D.   On: 02/01/2016 14:52   Mr Jeananne Rama W/wo Cm/mrcp  Result Date: 02/01/2016 CLINICAL DATA:  Choledocholithiasis. Abdominal pain and pancreatitis. Mildly dilated common bile duct. EXAM: MRI ABDOMEN WITHOUT AND WITH CONTRAST (INCLUDING MRCP) TECHNIQUE: Multiplanar multisequence MR imaging of the abdomen was performed both before and after  the administration of intravenous contrast. Heavily T2-weighted images of the biliary and pancreatic ducts were obtained, and three-dimensional MRCP images were rendered by post processing. CONTRAST:  98mL MULTIHANCE GADOBENATE DIMEGLUMINE 529 MG/ML IV SOLN COMPARISON:  01/31/2016 FINDINGS: Lower chest: Linear atelectasis in the right lower lobe and right middle lobe. Hepatobiliary: Hepatomegaly, craniocaudad excursion liver 26.5 cm, although this elongation primarily involves the right hepatic lobe. There about 6 adjacent filling defects within the common bile duct, each measuring about 5-10 mm in long axis, as shown on images 26-32 of series 9, associated with mild CBD dilatation to 9 mm. Conical tapering of the distal CBD as shown on image 26/9. This empties into the vicinity of a periampullary duodenal diverticulum. Diffuse hepatic steatosis. Cholecystectomy. No significant in intrahepatic biliary dilatation. No significant abnormal hepatic parenchymal enhancement. Pancreas: Mild peripancreatic stranding, no pancreatic mass identified. Spleen:  Unremarkable Adrenals/Urinary Tract:  Unremarkable Stomach/Bowel: Small periampullary duodenal diverticulum. Vascular/Lymphatic:  Reactive small porta hepatis lymph nodes. Other: Mild edema tracks in the mesentery and along the left anterior perirenal fascia margin. This is likely related to pancreatitis. Musculoskeletal: Unremarkable IMPRESSION: 1. Choledocholithiasis, with at least 6 stones in the 5-10 mm long axis range filling a 5 cm segment of the CBD. The distal CBD tapers in a conical fashion towards the ampulla. There is a small periampullary duodenal diverticulum. CBD caliber 9 mm. 2. Diffuse hepatic steatosis.  Elongated right hepatic lobe. 3. Mild right basilar atelectasis. 4. Acute pancreatitis with peripancreatic stranding tracking in the mesentery. No findings of pancreatic necrosis or dilatation of the dorsal pancreatic duct. Electronically Signed   By:  Van Clines M.D.   On: 02/01/2016 14:52    Medications:  Scheduled: . potassium chloride  40 mEq Oral BID  . triamterene-hydrochlorothiazide  1 tablet Oral Daily   Continuous: . sodium chloride 100 mL/hr at 02/02/16 0138    Assessment/Plan: 1) Choledocholithiasis. 2) Gallstone pancreatitis.   She is well clinically.  Sleep is poor, but she reports poor sleep at home as a baseline.    Plan: 1) ERCP tomorrow. 2) Hold Lovenox. 3) Continue with clears until midnight.  LOS: 2 days   Anjelika Ausburn D 02/02/2016, 7:26 AM

## 2016-02-03 ENCOUNTER — Encounter (HOSPITAL_COMMUNITY): Payer: Self-pay | Admitting: *Deleted

## 2016-02-03 ENCOUNTER — Inpatient Hospital Stay (HOSPITAL_COMMUNITY): Payer: 59 | Admitting: Certified Registered Nurse Anesthetist

## 2016-02-03 ENCOUNTER — Encounter (HOSPITAL_COMMUNITY)
Admission: EM | Disposition: A | Discharge status: Home / Self Care | Payer: Self-pay | Source: Home / Self Care | Attending: Internal Medicine

## 2016-02-03 ENCOUNTER — Inpatient Hospital Stay (HOSPITAL_COMMUNITY): Payer: 59

## 2016-02-03 HISTORY — PX: ERCP: SHX5425

## 2016-02-03 SURGERY — ERCP, WITH INTERVENTION IF INDICATED
Anesthesia: General

## 2016-02-03 MED ORDER — CIPROFLOXACIN IN D5W 400 MG/200ML IV SOLN
INTRAVENOUS | Status: DC | PRN
  Administered 2016-02-03: 400 mg via INTRAVENOUS

## 2016-02-03 MED ORDER — FENTANYL CITRATE (PF) 100 MCG/2ML IJ SOLN
INTRAMUSCULAR | Status: DC | PRN
  Administered 2016-02-03 (×2): 50 ug via INTRAVENOUS

## 2016-02-03 MED ORDER — INDOMETHACIN 50 MG RE SUPP
RECTAL | Status: AC
  Filled 2016-02-03: qty 2

## 2016-02-03 MED ORDER — DEXAMETHASONE SODIUM PHOSPHATE 10 MG/ML IJ SOLN
INTRAMUSCULAR | Status: DC | PRN
Start: 2016-02-03 — End: 2016-02-03
  Administered 2016-02-03: 10 mg via INTRAVENOUS

## 2016-02-03 MED ORDER — ROCURONIUM BROMIDE 100 MG/10ML IV SOLN
INTRAVENOUS | Status: DC | PRN
  Administered 2016-02-03: 30 mg via INTRAVENOUS

## 2016-02-03 MED ORDER — IOPAMIDOL (ISOVUE-300) INJECTION 61%
INTRAVENOUS | Status: AC
  Filled 2016-02-03: qty 50

## 2016-02-03 MED ORDER — ONDANSETRON HCL 4 MG PO TABS: 4.0000 mg | ORAL_TABLET | Freq: Four times a day (QID) | ORAL | 0 refills | Status: DC | PRN

## 2016-02-03 MED ORDER — SUCCINYLCHOLINE CHLORIDE 20 MG/ML IJ SOLN
INTRAMUSCULAR | Status: DC | PRN
  Administered 2016-02-03: 120 mg via INTRAVENOUS

## 2016-02-03 MED ORDER — PROPOFOL 10 MG/ML IV BOLUS
INTRAVENOUS | Status: DC | PRN
  Administered 2016-02-03: 200 mg via INTRAVENOUS

## 2016-02-03 MED ORDER — SODIUM CHLORIDE 0.9 % IV SOLN: INTRAVENOUS | Status: DC

## 2016-02-03 MED ORDER — SODIUM CHLORIDE 0.9 % IV SOLN
INTRAVENOUS | Status: DC | PRN
  Administered 2016-02-03: 20 mL

## 2016-02-03 MED ORDER — LIDOCAINE HCL (CARDIAC) 20 MG/ML IV SOLN
INTRAVENOUS | Status: DC | PRN
  Administered 2016-02-03: 50 mg via INTRAVENOUS

## 2016-02-03 MED ORDER — ONDANSETRON HCL 4 MG/2ML IJ SOLN
INTRAMUSCULAR | Status: DC | PRN
Start: 2016-02-03 — End: 2016-02-03
  Administered 2016-02-03: 4 mg via INTRAVENOUS

## 2016-02-03 MED ORDER — CIPROFLOXACIN IN D5W 400 MG/200ML IV SOLN
INTRAVENOUS | Status: AC
  Filled 2016-02-03: qty 200

## 2016-02-03 MED ORDER — SUGAMMADEX SODIUM 200 MG/2ML IV SOLN
INTRAVENOUS | Status: DC | PRN
  Administered 2016-02-03: 170 mg via INTRAVENOUS

## 2016-02-03 MED ORDER — INDOMETHACIN 50 MG RE SUPP
RECTAL | Status: DC | PRN
  Administered 2016-02-03: 100 mg via RECTAL

## 2016-02-03 MED ORDER — LACTATED RINGERS IV SOLN
INTRAVENOUS | Status: DC
Start: 2016-02-03 — End: 2016-02-03
  Administered 2016-02-03 (×3): via INTRAVENOUS

## 2016-02-03 NOTE — Anesthesia Preprocedure Evaluation (Signed)
Anesthesia Evaluation  Patient identified by MRN, date of birth, ID band Patient awake    Reviewed: Allergy & Precautions, NPO status , Patient's Chart, lab work & pertinent test results  Airway Mallampati: II  TM Distance: >3 FB     Dental  (+) Teeth Intact, Dental Advisory Given   Pulmonary former smoker,    breath sounds clear to auscultation       Cardiovascular hypertension,  Rhythm:Regular Rate:Normal     Neuro/Psych    GI/Hepatic   Endo/Other    Renal/GU      Musculoskeletal   Abdominal   Peds  Hematology   Anesthesia Other Findings   Reproductive/Obstetrics                             Anesthesia Physical Anesthesia Plan  ASA: II  Anesthesia Plan: General   Post-op Pain Management:    Induction: Intravenous  Airway Management Planned: Oral ETT  Additional Equipment:   Intra-op Plan:   Post-operative Plan: Extubation in OR  Informed Consent: I have reviewed the patients History and Physical, chart, labs and discussed the procedure including the risks, benefits and alternatives for the proposed anesthesia with the patient or authorized representative who has indicated his/her understanding and acceptance.   Dental advisory given  Plan Discussed with: CRNA and Anesthesiologist  Anesthesia Plan Comments:         Anesthesia Quick Evaluation

## 2016-02-03 NOTE — Anesthesia Procedure Notes (Signed)
Procedure Name: Intubation Date/Time: 02/03/2016 2:01 PM Performed by: Rejeana Brock L Pre-anesthesia Checklist: Patient identified, Emergency Drugs available, Suction available and Patient being monitored Patient Re-evaluated:Patient Re-evaluated prior to inductionOxygen Delivery Method: Circle System Utilized Preoxygenation: Pre-oxygenation with 100% oxygen Intubation Type: IV induction Ventilation: Mask ventilation without difficulty Laryngoscope Size: Mac and 4 Grade View: Grade III Tube type: Oral Tube size: 7.5 mm Number of attempts: 1 Airway Equipment and Method: Stylet and Oral airway Placement Confirmation: ETT inserted through vocal cords under direct vision,  positive ETCO2 and breath sounds checked- equal and bilateral Secured at: 21 cm Tube secured with: Tape Dental Injury: Teeth and Oropharynx as per pre-operative assessment

## 2016-02-03 NOTE — Anesthesia Postprocedure Evaluation (Signed)
Anesthesia Post Note  Patient: Mercedes Webb  Procedure(s) Performed: Procedure(s) (LRB): ENDOSCOPIC RETROGRADE CHOLANGIOPANCREATOGRAPHY (ERCP) (N/A)  Patient location during evaluation: Endoscopy Anesthesia Type: General Level of consciousness: awake, awake and alert and oriented Pain management: pain level controlled Vital Signs Assessment: post-procedure vital signs reviewed and stable Respiratory status: spontaneous breathing, nonlabored ventilation and respiratory function stable Cardiovascular status: blood pressure returned to baseline Anesthetic complications: no    Last Vitals:  Vitals:   02/03/16 1510 02/03/16 1526  BP: (!) 163/85 (!) 148/61  Pulse: 60 61  Resp: 14 17  Temp:  36.8 C    Last Pain:  Vitals:   02/03/16 1526  TempSrc: Oral  PainSc:                  Jody Aguinaga COKER

## 2016-02-03 NOTE — Progress Notes (Signed)
Notified dr. Seth Bake that patient had tolerated her diet and was requesting /c

## 2016-02-03 NOTE — Transfer of Care (Signed)
Immediate Anesthesia Transfer of Care Note  Patient: Mercedes Webb  Procedure(s) Performed: Procedure(s): ENDOSCOPIC RETROGRADE CHOLANGIOPANCREATOGRAPHY (ERCP) (N/A)  Patient Location: Endoscopy Unit  Anesthesia Type:General  Level of Consciousness: awake, alert , oriented and patient cooperative  Airway & Oxygen Therapy: Patient Spontanous Breathing  Post-op Assessment: Report given to RN and Post -op Vital signs reviewed and stable  Post vital signs: Reviewed and stable  Last Vitals:  Vitals:   02/02/16 2131 02/03/16 1156  BP: (!) 145/64 (!) 174/76  Pulse: 62 63  Resp: 18 14  Temp: 37.4 C 37.1 C    Last Pain:  Vitals:   02/03/16 1156  TempSrc: Oral  PainSc:       Patients Stated Pain Goal: 0 ([id number])  Complications: No apparent anesthesia complications

## 2016-02-03 NOTE — Op Note (Signed)
[facility] Patient Name: Mercedes Webb Procedure Date : 02/03/2016 MRN: [id number] Attending MD: Carol Ada , MD Date of Birth: 09-23-1955 CSN: [id number] Age: 60 Admit Type: Inpatient Procedure:                ERCP Indications:              Bile duct stone(s) Providers:                Carol Ada, MD, Vista Lawman, RN, Despina Pole                            Tech, Technician, Rejeana Brock, CRNA Referring MD:              Medicines:                General Anesthesia, Cipro 400 mg IV, and post ERCP                            Indomethacin 100 mg PR. Complications:            No immediate complications. Estimated Blood Loss:     Estimated blood loss: none. Procedure:                Pre-Anesthesia Assessment:                           - Prior to the procedure, a History and Physical                            was performed, and patient medications and                            allergies were reviewed. The patient's tolerance of                            previous anesthesia was also reviewed. The risks                            and benefits of the procedure and the sedation                            options and risks were discussed with the patient.                            All questions were answered, and informed consent                            was obtained. Prior Anticoagulants: The patient has                            taken no previous anticoagulant or antiplatelet                            agents. ASA Grade Assessment: II - A patient with  mild systemic disease. After reviewing the risks                            and benefits, the patient was deemed in                            satisfactory condition to undergo the procedure.                           - Sedation was administered by an anesthesia                            professional. General anesthesia was attained.                           After obtaining informed  consent, the scope was                            passed under direct vision. Throughout the                            procedure, the patient's blood pressure, pulse, and                            oxygen saturations were monitored continuously. The                            [id number] [id number]) scope was introduced through                            the mouth, and used to inject contrast into and                            used to inject contrast into the dorsal pancreatic                            duct. The ERCP was accomplished without difficulty.                            The patient tolerated the procedure well. Scope In: Scope Out: Findings:      The biliary tree was swept with a 15 mm balloon starting at the       bifurcation. Many stones were removed. No stones remained.      The duodenoscope was advanced to the second portion of the duodenum and       the ampulla was identified. Spontaneous drainage of bile was noted as       well as a large periampullary diverticulum. Cannulation of the CBD was       achieved with ease during the first attempt. The guidewire was secured       in the right intrahepatic ducts. Contrast injection revealed multiple       CBD stones in the proximal CBD and the distal CBD. The CBD was dilated       at 13 mm. A 1 cm sphincterotomy was carefully created  in light of the       large diverticulum. The CBD was swept multiple times and may stones were       extracted. The final occlusion cholangiogram was negative for any       retained stones. Impression:               - Choledocholithiasis was found. Complete removal                            was accomplished by balloon extraction.                           - The biliary tree was swept. Moderate Sedation:      Moderate (conscious) sedation was not administered. [Parameters       Monitored]. [Procedure Duration Time]. Recommendation:           - Return patient to hospital ward for ongoing care.                            - Resume regular diet.                           - Follow up with Dr. Earlean Shawl in 4 weeks.                           - If the patient is well by this evening, she can                            be discharged home. Procedure Code(s):        --- Professional ---                           [phone], Endoscopic retrograde                            cholangiopancreatography (ERCP); with removal of                            calculi/debris from biliary/pancreatic duct(s) Diagnosis Code(s):        --- Professional ---                           K80.50, Calculus of bile duct without cholangitis                            or cholecystitis without obstruction CPT copyright 2016 [employer]. All rights reserved. The codes documented in this report are preliminary and upon coder review may  be revised to meet current compliance requirements. Carol Ada, MD Carol Ada, MD 02/03/2016 2:49:50 PM This report has been signed electronically. Number of Addenda: 0

## 2016-02-03 NOTE — Progress Notes (Signed)
   Subjective: Ms. ELAYSIA PETTINATO continues to feel fine today except for her continued lower back pain. She denies nausea or vomiting. She has had 2 bowel movements overnight.   Objective:  Vital signs in last 24 hours: Vitals:   02/01/16 2115 02/02/16 0614 02/02/16 2131 02/03/16 1156  BP: (!) 135/57 (!) 139/55 (!) 145/64 (!) 174/76  Pulse: 75 77 62 63  Resp: 18 18 18 14   Temp: 98.3 F (36.8 C) 98.1 F (36.7 C) 99.3 F (37.4 C) 98.8 F (37.1 C)  TempSrc: Oral Oral Oral Oral  SpO2: 96% 96% 100% 100%  Weight:      Height:       Physical Exam  Constitutional: No distress.  Cardiovascular: Normal rate and regular rhythm.   No murmur heard. Pulmonary/Chest: Breath sounds normal. She has no wheezes. She has no rales.  Abdominal: Soft. She exhibits no distension. There is no tenderness.  Musculoskeletal:  No tenderness to palpation   Extremities: no calf tenderness, no peripheral edema   Medications: Infusions: . sodium chloride 100 mL/hr at 02/03/16 0050  . sodium chloride    . lactated ringers 10 mL/hr at 02/03/16 1201   Scheduled Medications: . [MAR Hold] potassium chloride  40 mEq Oral BID  . [MAR Hold] triamterene-hydrochlorothiazide  1 tablet Oral Daily   PRN Medications: [MAR Hold]  morphine injection, [MAR Hold] ondansetron **OR** [MAR Hold] ondansetron (ZOFRAN) IV  Assessment/Plan: Principal Problem:   Acute biliary pancreatitis Active Problems:   Essential hypertension   Generalized anxiety disorder   Hypokalemia  Acute biliary pancreatitis  Ms. Lenhoff will have ERCP performed today to remove the multiple stones that she has retained s/p cholecystectomy. Her pain, nausea, and vomiting remains resolved.  She has been NPO overnight but can return to clear liquids after the procedure. We will also continue IV fluids, and IV morphine for pain control.   Hypokalemia  Resolved, will discontinue K-DUR at this time   Hypertension  Her blood pressure remains  well controlled with her home dose of triamterene-HCTZ.   Dispo: Anticipated discharge in approximately 1-2 day(s).   LOS: 3 days   Ledell Noss, MD 02/03/2016, 12:06 PM Pager: [phone]

## 2016-02-04 NOTE — Discharge Summary (Signed)
Name: Mercedes Webb MRN: [id number] DOB: 1956-02-01 60 y.o. PCP: Leonides Sake, MD  Date of Admission: 01/31/2016  6:53 AM Date of Discharge: 02/03/2016 Attending Physician: Dr. Michel Bickers Discharge Diagnosis: Principal Problem:   Acute biliary pancreatitis Active Problems:   Essential hypertension   Generalized anxiety disorder   Hypokalemia  Discharge Medications:   Medication List    STOP taking these medications   valACYclovir 500 MG tablet Commonly known as:  VALTREX     TAKE these medications   ALPRAZolam 1 MG tablet Commonly known as:  XANAX Take 1 mg by mouth 3 (three) times daily as needed for anxiety.   cyanocobalamin 1000 MCG/ML injection Commonly known as:  (VITAMIN B-12) Inject 1 mL (1,000 mcg total) into the muscle every 30 (thirty) days.   estradiol 0.5 MG tablet Commonly known as:  ESTRACE Take 1 tablet (0.5 mg total) by mouth daily.   ibuprofen 800 MG tablet Commonly known as:  ADVIL,MOTRIN Take 800 mg by mouth every 8 (eight) hours as needed for moderate pain.   meclizine 12.5 MG tablet Commonly known as:  ANTIVERT Take 12.5 mg by mouth 3 (three) times daily as needed for dizziness or nausea.   MELATONIN MAXIMUM STRENGTH 5 MG Tabs Generic drug:  Melatonin Take 5 mg by mouth at bedtime as needed (sleep).   ondansetron 4 MG tablet Commonly known as:  ZOFRAN Take 1 tablet (4 mg total) by mouth every 6 (six) hours as needed for nausea or vomiting.   phentermine 37.5 MG tablet Commonly known as:  ADIPEX-P Take 1 tablet (37.5 mg total) by mouth daily before breakfast.   progesterone 200 MG capsule Commonly known as:  PROMETRIUM Take 1 capsule (200 mg total) by mouth daily.   triamterene-hydrochlorothiazide 37.5-25 MG tablet Commonly known as:  MAXZIDE-25 Take 1 tablet by mouth daily.      Disposition and follow-up:   Mercedes Webb was discharged from [facility] in Good condition.  At the hospital  follow up visit please address:  1.  Has her appetite returned, has back pain resolved?   2.  Labs / imaging needed at time of follow-up: cmet   3.  Pending labs/ test needing follow-up: none   Follow-up Appointments: Follow-up Information    HAMRICK,MAURA L, MD Follow up in 1 week(s).   Specialty:  Family Medicine Contact information: [place] [address] [place], [place] Follow up in 4 week(s).   Specialty:  Gastroenterology Contact information: [place] [address] [place] Hospital Course by problem list: Principal Problem:   Acute biliary pancreatitis Active Problems:   Essential hypertension   Generalized anxiety disorder   Hypokalemia   Acute biliary pancreatitis Mercedes Webb is a 60 year old female who is s/p cholecystectomy with a PMH of hypertension and anxiety who presented with 2 days of worsening nausea, vomiting,  abdominal pain. The emergency department she was found to have elevations in lipase, liver transaminase, and total bilirubin and CT of the abdomen revealed pancreatic inflammation and 1.2 cm common bile duct dilatation. She was made NPO and started on IV fluids and IV morphine. Her abdominal pain improved from >10/10 on the day of presentation to 6/10 on the second day of admission and MRCP revealed choledocholithiasis with numerous stones in the common bile duct. On the day of discharge her abdominal and  nausea had resolved completely and ERCP was performed with complete removal of stones and biliary tree sweep. She was able to tolerate normal diet and was scheduled for outpatient follow up with Dr. Earlean Shawl and her primary care physician.   Hypokalemia  On admission her potassium was 2.8, this was treated with K-DUR 40 meq BID. She had a normal magnesium of 1.9. This hypokalemia was thought to be secondary to vomiting.    Essential hypertension  This was managed  with her home medication triamterene-HCTZ and her blood pressure remained well controlled.   Discharge Vitals:   BP (!) 148/61 (BP Location: Left Arm)   Pulse 61   Temp 98.3 F (36.8 C) (Oral)   Resp 17   Ht 5\' 7"  (1.702 m)   Wt 182 lb 4.8 oz (82.7 kg)   SpO2 100%   BMI 28.55 kg/m   Pertinent Labs, Studies, and Procedures:  Procedures Performed:  Dg Chest 2 View  Result Date: 01/31/2016 CLINICAL DATA:  Nausea and vomiting with upper abdominal and back pain for the past 2 days EXAM: CHEST  2 VIEW COMPARISON:  None in PACs FINDINGS: The lungs are adequately inflated. There is linear increased density at both lung bases greatest in the right middle lobe. There is subtle increased density peripherally in the left upper lobe which overlies the anterior aspect of the second rib. The heart and pulmonary vascularity are normal. The mediastinum is normal in width. There is no pleural effusion. The bony thorax is unremarkable. IMPRESSION: Bibasilar subsegmental atelectasis or early interstitial infiltrate greatest in the right middle lobe. No alveolar pneumonia or CHF. Subtle increased density projects over the anterior aspect of the left second rib on the frontal radiograph but is not evident on the lateral view. Followup PA and lateral chest X-ray is recommended in 3-4 weeks following trial of antibiotic therapy to ensure resolution and exclude underlying malignancy. Electronically Signed   By: David  Martinique M.D.   On: 01/31/2016 07:22   Ct Abdomen Pelvis W Contrast  Result Date: 01/31/2016 CLINICAL DATA:  Abdominal pain, bloating, nausea and vomiting for 3 days. EXAM: CT ABDOMEN AND PELVIS WITH CONTRAST TECHNIQUE: Multidetector CT imaging of the abdomen and pelvis was performed using the standard protocol following bolus administration of intravenous contrast. CONTRAST:  100 ISOVUE-300 IOPAMIDOL (ISOVUE-300) INJECTION 61% COMPARISON:  None. FINDINGS: Lower chest: Mild dependent atelectasis is seen.  Scar or atelectasis in the inferior right middle lobe is noted. No pleural or pericardial effusion. Heart size is upper normal. Hepatobiliary: The liver is low attenuating consistent with fatty infiltration. The liver measures 26 cm craniocaudal. The gallbladder has been removed. The common bile duct is mildly dilated at 1.2 cm. Pancreas: Diffuse stranding is present about the pancreas. The pancreas enhances homogeneously. No focal fluid collection is seen. Spleen: Unremarkable. Adrenals/Urinary Tract: Unremarkable. Stomach/Bowel: Duodenal diverticulum is noted. Otherwise unremarkable. Vascular/Lymphatic: A few atherosclerotic calcifications are identified. Reproductive: Status post hysterectomy and tubal ligation. Other: Unremarkable. Musculoskeletal: Unremarkable. IMPRESSION: Findings consistent with pancreatitis without pancreatic necrosis or pseudocyst formation. Hepatomegaly and fatty infiltration of the liver. Duodenal diverticulum. Mild dilatation of the common bile duct may be related to cholecystectomy and the patient's age. No stone is seen within the duct and there is no intrahepatic biliary ductal dilatation. Electronically Signed   By: Inge Rise M.D.   On: 01/31/2016 09:30   Mr 3d Recon At Scanner  Result Date: 02/01/2016 CLINICAL DATA:  Choledocholithiasis. Abdominal pain and pancreatitis. Mildly dilated common bile duct.  EXAM: MRI ABDOMEN WITHOUT AND WITH CONTRAST (INCLUDING MRCP) TECHNIQUE: Multiplanar multisequence MR imaging of the abdomen was performed both before and after the administration of intravenous contrast. Heavily T2-weighted images of the biliary and pancreatic ducts were obtained, and three-dimensional MRCP images were rendered by post processing. CONTRAST:  92mL MULTIHANCE GADOBENATE DIMEGLUMINE 529 MG/ML IV SOLN COMPARISON:  01/31/2016 FINDINGS: Lower chest: Linear atelectasis in the right lower lobe and right middle lobe. Hepatobiliary: Hepatomegaly, craniocaudad  excursion liver 26.5 cm, although this elongation primarily involves the right hepatic lobe. There about 6 adjacent filling defects within the common bile duct, each measuring about 5-10 mm in long axis, as shown on images 26-32 of series 9, associated with mild CBD dilatation to 9 mm. Conical tapering of the distal CBD as shown on image 26/9. This empties into the vicinity of a periampullary duodenal diverticulum. Diffuse hepatic steatosis. Cholecystectomy. No significant in intrahepatic biliary dilatation. No significant abnormal hepatic parenchymal enhancement. Pancreas: Mild peripancreatic stranding, no pancreatic mass identified. Spleen:  Unremarkable Adrenals/Urinary Tract:  Unremarkable Stomach/Bowel: Small periampullary duodenal diverticulum. Vascular/Lymphatic:  Reactive small porta hepatis lymph nodes. Other: Mild edema tracks in the mesentery and along the left anterior perirenal fascia margin. This is likely related to pancreatitis. Musculoskeletal: Unremarkable IMPRESSION: 1. Choledocholithiasis, with at least 6 stones in the 5-10 mm long axis range filling a 5 cm segment of the CBD. The distal CBD tapers in a conical fashion towards the ampulla. There is a small periampullary duodenal diverticulum. CBD caliber 9 mm. 2. Diffuse hepatic steatosis.  Elongated right hepatic lobe. 3. Mild right basilar atelectasis. 4. Acute pancreatitis with peripancreatic stranding tracking in the mesentery. No findings of pancreatic necrosis or dilatation of the dorsal pancreatic duct. Electronically Signed   By: Van Clines M.D.   On: 02/01/2016 14:52   Dg Ercp Biliary & Pancreatic Ducts  Result Date: 02/03/2016 CLINICAL DATA:  Choledocholithiasis. EXAM: ERCP TECHNIQUE: Multiple spot images obtained with the fluoroscopic device and submitted for interpretation post-procedure. FLUOROSCOPY TIME:  Fluoroscopy Time:  2 minutes and 24 seconds Number of Acquired Spot Images: 4 COMPARISON:  MRI 02/01/2016 FINDINGS:  Common bile duct was cannulated. Wire advanced into the intrahepatic bile ducts. Multiple filling defects in the common bile duct are compatible with stones. No significant stones are present on the final images. IMPRESSION: Choledocholithiasis and stone removal. These images were submitted for radiologic interpretation only. Please see the procedural report for the amount of contrast and the fluoroscopy time utilized. Electronically Signed   By: Markus Daft M.D.   On: 02/03/2016 15:06   Mr Jeananne Rama W/wo Cm/mrcp  Result Date: 02/01/2016 CLINICAL DATA:  Choledocholithiasis. Abdominal pain and pancreatitis. Mildly dilated common bile duct. EXAM: MRI ABDOMEN WITHOUT AND WITH CONTRAST (INCLUDING MRCP) TECHNIQUE: Multiplanar multisequence MR imaging of the abdomen was performed both before and after the administration of intravenous contrast. Heavily T2-weighted images of the biliary and pancreatic ducts were obtained, and three-dimensional MRCP images were rendered by post processing. CONTRAST:  75mL MULTIHANCE GADOBENATE DIMEGLUMINE 529 MG/ML IV SOLN COMPARISON:  01/31/2016 FINDINGS: Lower chest: Linear atelectasis in the right lower lobe and right middle lobe. Hepatobiliary: Hepatomegaly, craniocaudad excursion liver 26.5 cm, although this elongation primarily involves the right hepatic lobe. There about 6 adjacent filling defects within the common bile duct, each measuring about 5-10 mm in long axis, as shown on images 26-32 of series 9, associated with mild CBD dilatation to 9 mm. Conical tapering of the distal CBD as shown on image 26/9.  This empties into the vicinity of a periampullary duodenal diverticulum. Diffuse hepatic steatosis. Cholecystectomy. No significant in intrahepatic biliary dilatation. No significant abnormal hepatic parenchymal enhancement. Pancreas: Mild peripancreatic stranding, no pancreatic mass identified. Spleen:  Unremarkable Adrenals/Urinary Tract:  Unremarkable Stomach/Bowel: Small  periampullary duodenal diverticulum. Vascular/Lymphatic:  Reactive small porta hepatis lymph nodes. Other: Mild edema tracks in the mesentery and along the left anterior perirenal fascia margin. This is likely related to pancreatitis. Musculoskeletal: Unremarkable IMPRESSION: 1. Choledocholithiasis, with at least 6 stones in the 5-10 mm long axis range filling a 5 cm segment of the CBD. The distal CBD tapers in a conical fashion towards the ampulla. There is a small periampullary duodenal diverticulum. CBD caliber 9 mm. 2. Diffuse hepatic steatosis.  Elongated right hepatic lobe. 3. Mild right basilar atelectasis. 4. Acute pancreatitis with peripancreatic stranding tracking in the mesentery. No findings of pancreatic necrosis or dilatation of the dorsal pancreatic duct. Electronically Signed   By: Van Clines M.D.   On: 02/01/2016 14:52   Consultations: Treatment Team:  Carol Ada, MD  Discharge Instructions: Discharge Instructions    Diet - low sodium heart healthy    Complete by:  As directed    Increase activity slowly    Complete by:  As directed       Signed: Ledell Noss, MD 02/04/2016, 7:08 AM   Pager: [phone]

## 2016-02-06 ENCOUNTER — Encounter (HOSPITAL_COMMUNITY): Payer: Self-pay | Admitting: Gastroenterology

## 2016-02-23 ENCOUNTER — Ambulatory Visit (INDEPENDENT_AMBULATORY_CARE_PROVIDER_SITE_OTHER): Payer: 59

## 2016-02-23 VITALS — BP 109/71 | HR 89 | Ht 67.0 in | Wt 179.2 lb

## 2016-02-23 DIAGNOSIS — E663 Overweight: Secondary | ICD-10-CM

## 2016-02-23 MED ORDER — CYANOCOBALAMIN 1000 MCG/ML IJ SOLN
1000.0000 ug | Freq: Once | INTRAMUSCULAR | Status: AC
  Administered 2016-02-23: 1000 ug via INTRAMUSCULAR

## 2016-02-23 NOTE — Progress Notes (Signed)
Pt presents for weight, B/P, B-12 injection. No side effects of medication-Phentermine, or B-12.  Weight loss of _3_ lbs. Encouraged eating healthy and exercise.

## 2016-03-20 ENCOUNTER — Ambulatory Visit (INDEPENDENT_AMBULATORY_CARE_PROVIDER_SITE_OTHER): Payer: 59 | Admitting: Obstetrics and Gynecology

## 2016-03-20 VITALS — BP 138/75 | HR 88 | Ht 67.0 in | Wt 175.9 lb

## 2016-03-20 DIAGNOSIS — E663 Overweight: Secondary | ICD-10-CM | POA: Diagnosis not present

## 2016-03-20 MED ORDER — CYANOCOBALAMIN 1000 MCG/ML IJ SOLN
1000.0000 ug | Freq: Once | INTRAMUSCULAR | Status: AC
  Administered 2016-03-20: 1000 ug via INTRAMUSCULAR

## 2016-03-20 NOTE — Progress Notes (Signed)
Patient ID: Mercedes Webb, female   DOB: 1955-08-03, 60 y.o.   MRN: [id number]  Pt presents for weight, B/P, B-12 injection. No side effects of medication-Phentermine, or B-12.  Weight loss/gain of _4_ lbs. Encouraged eating healthy and exercise.

## 2016-04-05 LAB — BASIC METABOLIC PANEL WITH GFR
BUN: 23 — AB (ref 4–21)
Creatinine: 1 (ref 0.5–1.1)
Glucose: 90
Potassium: 4.5 (ref 3.4–5.3)
Sodium: 140 (ref 137–147)

## 2016-04-05 LAB — HEPATIC FUNCTION PANEL
ALT: 29 (ref 7–35)
AST: 27 (ref 13–35)
Alkaline Phosphatase: 127 — AB (ref 25–125)
Bilirubin, Total: 0.3

## 2016-04-05 LAB — CBC AND DIFFERENTIAL
HCT: 39 (ref 36–46)
Hemoglobin: 13 (ref 12.0–16.0)
Platelets: 369 (ref 150–399)
WBC: 4.4

## 2016-04-20 ENCOUNTER — Encounter: Payer: 59 | Admitting: Obstetrics and Gynecology

## 2016-04-26 ENCOUNTER — Encounter: Payer: Self-pay | Admitting: Obstetrics and Gynecology

## 2016-04-26 ENCOUNTER — Ambulatory Visit (INDEPENDENT_AMBULATORY_CARE_PROVIDER_SITE_OTHER): Payer: 59 | Admitting: Obstetrics and Gynecology

## 2016-04-26 VITALS — BP 133/85 | HR 96 | Ht 67.0 in | Wt 176.4 lb

## 2016-04-26 DIAGNOSIS — Z79899 Other long term (current) drug therapy: Secondary | ICD-10-CM | POA: Diagnosis not present

## 2016-04-26 MED ORDER — CYANOCOBALAMIN 1000 MCG/ML IJ SOLN: 1000.0000 ug | INTRAMUSCULAR | 1 refills | Status: DC

## 2016-04-26 MED ORDER — PHENTERMINE HCL 37.5 MG PO TABS: 37.5000 mg | ORAL_TABLET | Freq: Every day | ORAL | 2 refills | Status: DC

## 2016-04-26 NOTE — Progress Notes (Signed)
SUBJECTIVE:  60 y.o. here for follow-up weight loss visit, previously seen 4 weeks ago. Denies any concerns and feels like medication was working well, but was hospitalized with pancreatitis last month. Better now and desires restarting it. Feels good now. Is back to exercising.  OBJECTIVE:  BP 133/85   Pulse 96   Ht 5\' 7"  (1.702 m)   Wt 176 lb 6.4 oz (80 kg)   BMI 27.63 kg/m   Body mass index is 27.63 kg/m. Patient appears well. ASSESSMENT:  Obesity- responding well to weight loss plan PLAN:  To restart current weight loss medications. B12 1047mcg/ml injection given RTC in 4 weeks as planned  Melody [place], CNM

## 2016-05-24 ENCOUNTER — Ambulatory Visit (INDEPENDENT_AMBULATORY_CARE_PROVIDER_SITE_OTHER): Payer: 59 | Admitting: Obstetrics and Gynecology

## 2016-05-24 VITALS — BP 147/83 | HR 97 | Ht 67.0 in | Wt 176.9 lb

## 2016-05-24 DIAGNOSIS — E663 Overweight: Secondary | ICD-10-CM

## 2016-05-24 MED ORDER — CYANOCOBALAMIN 1000 MCG/ML IJ SOLN
1000.0000 ug | Freq: Once | INTRAMUSCULAR | Status: AC
  Administered 2016-05-24: 1000 ug via INTRAMUSCULAR

## 2016-05-24 NOTE — Progress Notes (Signed)
Patient ID: Mercedes Webb, female   DOB: [birth year], 61 y.o.   MRN: [id number] Pt presents for weight, B/P, B-12 injection. No side effects of medication-Phentermine, or B-12.  Weight gain of 1.5 lbs. Encouraged eating healthy and exercise.  Pt forgot her B12 medication so it was provided.

## 2016-06-22 ENCOUNTER — Ambulatory Visit (INDEPENDENT_AMBULATORY_CARE_PROVIDER_SITE_OTHER): Payer: 59 | Admitting: Obstetrics and Gynecology

## 2016-06-22 VITALS — BP 151/98 | HR 107 | Ht 67.0 in | Wt 172.6 lb

## 2016-06-22 DIAGNOSIS — E663 Overweight: Secondary | ICD-10-CM | POA: Diagnosis not present

## 2016-06-22 MED ORDER — CYANOCOBALAMIN 1000 MCG/ML IJ SOLN
1000.0000 ug | Freq: Once | INTRAMUSCULAR | Status: AC
  Administered 2016-06-22: 1000 ug via INTRAMUSCULAR

## 2016-06-22 NOTE — Progress Notes (Signed)
Patient ID: Mercedes Webb, female   DOB: 1956-02-08, 61 y.o.   MRN: [id number] Pt presents for weight, B/P, B-12 injection. No side effects of medication-Phentermine, or B-12.  Weight loss of 4 lbs. Encouraged eating healthy and exercise. B/P 151/ 98. Pt states her B/P is up due to rushing. To monitor. Patient was in a hurry so did not stay for second BP reading.

## 2016-06-27 NOTE — Progress Notes (Signed)
ERROR

## 2016-07-19 ENCOUNTER — Ambulatory Visit (INDEPENDENT_AMBULATORY_CARE_PROVIDER_SITE_OTHER): Payer: 59 | Admitting: Obstetrics and Gynecology

## 2016-07-19 ENCOUNTER — Encounter: Payer: Self-pay | Admitting: Obstetrics and Gynecology

## 2016-07-19 VITALS — BP 140/84 | HR 94 | Ht 67.0 in | Wt 172.9 lb

## 2016-07-19 DIAGNOSIS — Z79899 Other long term (current) drug therapy: Secondary | ICD-10-CM | POA: Diagnosis not present

## 2016-07-19 MED ORDER — CYANOCOBALAMIN 1000 MCG/ML IJ SOLN: 1000.0000 ug | INTRAMUSCULAR | 1 refills | Status: DC

## 2016-07-19 MED ORDER — PHENTERMINE HCL 37.5 MG PO TABS: 37.5000 mg | ORAL_TABLET | Freq: Every day | ORAL | 2 refills | Status: DC

## 2016-07-19 NOTE — Progress Notes (Signed)
SUBJECTIVE:  61 y.o. here for follow-up weight loss visit, previously seen 4 weeks ago. Denies any concerns and feels like medication is not working as well since she had pancreatitis. Desires to continue as she is doing weight loss and coaching through work, is exercising regular and stress is improved.  OBJECTIVE:  BP 140/84   Pulse 94   Ht 5\' 7"  (1.702 m)   Wt 172 lb 14.4 oz (78.4 kg)   BMI 27.08 kg/m   Body mass index is 27.08 kg/m. Patient appears well. ASSESSMENT:  Overweight- responding well to weight loss plan  PLAN:  To continue with current medications. B12 1032mcg/ml injection given RTC in 6 weeks for annual and will do weight check at that visit.  Maudine Kluesner [place], CNM

## 2016-09-11 LAB — BASIC METABOLIC PANEL
BUN: 17 (ref 4–21)
Creatinine: 1 (ref 0.5–1.1)
Glucose: 104
Potassium: 3.6 (ref 3.4–5.3)
SODIUM: 140 (ref 137–147)

## 2016-09-11 LAB — HEPATIC FUNCTION PANEL
ALK PHOS: 123 (ref 25–125)
ALT: 31 (ref 7–35)
AST: 23 (ref 13–35)
Bilirubin, Total: 0.3

## 2016-09-20 ENCOUNTER — Ambulatory Visit (INDEPENDENT_AMBULATORY_CARE_PROVIDER_SITE_OTHER): Payer: 59 | Admitting: Obstetrics and Gynecology

## 2016-09-20 ENCOUNTER — Encounter: Payer: Self-pay | Admitting: Obstetrics and Gynecology

## 2016-09-20 ENCOUNTER — Other Ambulatory Visit: Payer: Self-pay | Admitting: Obstetrics and Gynecology

## 2016-09-20 VITALS — BP 148/84 | HR 100 | Ht 67.0 in | Wt 171.0 lb

## 2016-09-20 DIAGNOSIS — Z01419 Encounter for gynecological examination (general) (routine) without abnormal findings: Secondary | ICD-10-CM

## 2016-09-20 NOTE — Progress Notes (Signed)
Subjective:   Mercedes Webb is a 61 y.o. G69P0 Caucasian female here for a routine well-woman exam.  No LMP recorded. Patient has had a hysterectomy.    Current complaints: none PCP: Hamrick       does desire labs  Social History: Sexual: heterosexual Marital Status: divorced Living situation: with partner / significant other Occupation: claims adjustor Tobacco/alcohol: no tobacco use Illicit drugs: no history of illicit drug use  The following portions of the patient's history were reviewed and updated as appropriate: allergies, current medications, past family history, past medical history, past social history, past surgical history and problem list.  Past Medical History Past Medical History:  Diagnosis Date  . Anxiety   . Cancer ([place]) 2016   Skin CA squamous cell on bilateral feet removed  . Cervical dysplasia    h/o  . Colon polyps    polyps on first scope ~ 2006, no recurrent polyps ~ 2001 and in 2016.   . Endometriosis   . Family history of adverse reaction to anesthesia    Mother had difficulty waking & nausea  . Headache   . Hypertension   . Insomnia   . Pancreatitis 01/2016  . Recurrent cold sores     Past Surgical History Past Surgical History:  Procedure Laterality Date  . ABDOMINAL HYSTERECTOMY     tah/bso- endometriosis  . CHOLECYSTECTOMY    . ERCP N/A 02/03/2016   Procedure: ENDOSCOPIC RETROGRADE CHOLANGIOPANCREATOGRAPHY (ERCP);  Surgeon: Carol Ada, MD;  Location: [facility] ENDOSCOPY;  Service: Endoscopy;  Laterality: N/A;  . KNEE ARTHROSCOPY    . tonsillectomy    . TONSILLECTOMY    . TUBAL LIGATION      Gynecologic History G1P0  No LMP recorded. Patient has had a hysterectomy. Contraception: post menopausal status Last Pap: ?Marland Kitchen Results were: normal Last mammogram: 2017. Results were: normal   Obstetric History OB History  Gravida Para Term Preterm AB Living  1         1  SAB TAB Ectopic Multiple Live Births          1    # Outcome Date GA  Lbr Len/2nd Weight Sex Delivery Anes PTL Lv  1 Gravida 1974   7 lb 1.9 oz (3.23 kg) F Vag-Spont   LIV      Current Medications Current Outpatient Prescriptions on File Prior to Visit  Medication Sig Dispense Refill  . ALPRAZolam (XANAX) 1 MG tablet Take 1 mg by mouth 3 (three) times daily as needed for anxiety.     . cyanocobalamin (,VITAMIN B-12,) 1000 MCG/ML injection Inject 1 mL (1,000 mcg total) into the muscle every 30 (thirty) days. 10 mL 1  . estradiol (ESTRACE) 0.5 MG tablet Take 1 tablet (0.5 mg total) by mouth daily. 90 tablet 4  . ibuprofen (ADVIL,MOTRIN) 800 MG tablet Take 800 mg by mouth every 8 (eight) hours as needed for moderate pain.     . Melatonin (MELATONIN MAXIMUM STRENGTH) 5 MG TABS Take 5 mg by mouth at bedtime as needed (sleep).     . progesterone (PROMETRIUM) 200 MG capsule Take 1 capsule (200 mg total) by mouth daily. 60 capsule 4  . triamterene-hydrochlorothiazide (MAXZIDE-25) 37.5-25 MG per tablet Take 1 tablet by mouth daily.     . meclizine (ANTIVERT) 12.5 MG tablet Take 12.5 mg by mouth 3 (three) times daily as needed for dizziness or nausea.    . phentermine (ADIPEX-P) 37.5 MG tablet Take 1 tablet (37.5 mg total) by mouth daily  before breakfast. (Patient not taking: Reported on 09/20/2016) 30 tablet 2   No current facility-administered medications on file prior to visit.     Review of Systems Patient denies any headaches, blurred vision, shortness of breath, chest pain, abdominal pain, problems with bowel movements, urination, or intercourse.  Objective:  BP (!) 148/84   Pulse 100   Ht 5\' 7"  (1.702 m)   Wt 171 lb (77.6 kg)   BMI 26.78 kg/m  Physical Exam  General:  Well developed, well nourished, no acute distress. She is alert and oriented x3. Skin:  Warm and dry Neck:  Midline trachea, no thyromegaly or nodules Cardiovascular: Regular rate and rhythm, no murmur heard Lungs:  Effort normal, all lung fields clear to auscultation bilaterally Breasts:   No dominant palpable mass, retraction, or nipple discharge Abdomen:  Soft, non tender, no hepatosplenomegaly or masses Pelvic:  External genitalia is normal in appearance.  The vagina is normal in appearance. The cervix is bulbous, no CMT.  Thin prep pap is done with HR HPV cotesting. Uterus is felt to be normal size, shape, and contour.  No adnexal masses or tenderness noted. Extremities:  No swelling or varicosities noted Psych:  She has a normal mood and affect  Assessment:   Healthy well-woman exam Overweight HRT   Plan:  Labs obtained F/U 1 year for AE, or sooner if needed Mammogram ordered  Mercedes Webb, CNM

## 2016-09-21 LAB — CYTOLOGY - PAP

## 2016-09-21 LAB — LIPID PANEL
CHOLESTEROL TOTAL: 208 mg/dL — AB (ref 100–199)
Chol/HDL Ratio: 4.4 ratio (ref 0.0–4.4)
HDL: 47 mg/dL (ref >39)
LDL CALC: 84 mg/dL (ref 0–99)
Triglycerides: 386 mg/dL — ABNORMAL HIGH (ref 0–149)
VLDL Cholesterol Cal: 77 mg/dL — ABNORMAL HIGH (ref 5–40)

## 2016-09-21 LAB — COMPREHENSIVE METABOLIC PANEL
ALT: 27 IU/L (ref 0–32)
AST: 21 IU/L (ref 0–40)
Albumin/Globulin Ratio: 1.7 (ref 1.2–2.2)
Albumin: 4.5 g/dL (ref 3.6–4.8)
Alkaline Phosphatase: 113 IU/L (ref 39–117)
BUN/Creatinine Ratio: 17 (ref 12–28)
BUN: 17 mg/dL (ref 8–27)
Bilirubin Total: 0.2 mg/dL (ref 0.0–1.2)
CALCIUM: 9.7 mg/dL (ref 8.7–10.3)
CO2: 25 mmol/L (ref 18–29)
CREATININE: 1.01 mg/dL — AB (ref 0.57–1.00)
Chloride: 101 mmol/L (ref 96–106)
GFR calc Af Amer: 69 mL/min/{1.73_m2} (ref >59)
GFR, EST NON AFRICAN AMERICAN: 60 mL/min/{1.73_m2} (ref >59)
GLOBULIN, TOTAL: 2.6 g/dL (ref 1.5–4.5)
Glucose: 113 mg/dL — ABNORMAL HIGH (ref 65–99)
Potassium: 3.4 mmol/L — ABNORMAL LOW (ref 3.5–5.2)
SODIUM: 140 mmol/L (ref 134–144)
Total Protein: 7.1 g/dL (ref 6.0–8.5)

## 2016-09-21 LAB — TSH: TSH: 1.75 u[IU]/mL (ref 0.450–4.500)

## 2016-09-28 ENCOUNTER — Other Ambulatory Visit: Payer: Self-pay | Admitting: Obstetrics and Gynecology

## 2016-09-28 DIAGNOSIS — E782 Mixed hyperlipidemia: Secondary | ICD-10-CM

## 2016-09-28 DIAGNOSIS — E559 Vitamin D deficiency, unspecified: Secondary | ICD-10-CM | POA: Insufficient documentation

## 2016-09-28 DIAGNOSIS — E1169 Type 2 diabetes mellitus with other specified complication: Secondary | ICD-10-CM | POA: Insufficient documentation

## 2016-09-28 MED ORDER — ASPIRIN EC 81 MG PO TBEC: 81.0000 mg | DELAYED_RELEASE_TABLET | Freq: Every day | ORAL | 2 refills | Status: DC

## 2016-09-28 MED ORDER — ATORVASTATIN CALCIUM 10 MG PO TABS: 10.0000 mg | ORAL_TABLET | Freq: Every day | ORAL | 6 refills | Status: DC

## 2016-09-28 MED ORDER — VITAMIN D (ERGOCALCIFEROL) 1.25 MG (50000 UNIT) PO CAPS: 50000.0000 [IU] | ORAL_CAPSULE | ORAL | 1 refills | Status: DC

## 2016-09-28 MED ORDER — POTASSIUM CHLORIDE ER 10 MEQ PO TBCR: 10.0000 meq | EXTENDED_RELEASE_TABLET | Freq: Every day | ORAL | 1 refills | Status: DC

## 2016-10-01 ENCOUNTER — Telehealth: Payer: Self-pay | Admitting: Obstetrics and Gynecology

## 2016-10-01 NOTE — Telephone Encounter (Signed)
Patient called to see why the 4 new medications were called in for her Please call

## 2016-10-01 NOTE — Telephone Encounter (Signed)
Called pt and we discussed her results

## 2016-10-15 ENCOUNTER — Other Ambulatory Visit: Payer: Self-pay | Admitting: Obstetrics and Gynecology

## 2016-10-15 DIAGNOSIS — Z1231 Encounter for screening mammogram for malignant neoplasm of breast: Secondary | ICD-10-CM

## 2016-10-19 ENCOUNTER — Ambulatory Visit (INDEPENDENT_AMBULATORY_CARE_PROVIDER_SITE_OTHER): Payer: 59 | Admitting: Obstetrics and Gynecology

## 2016-10-19 VITALS — BP 135/88 | HR 91 | Ht 67.0 in | Wt 174.0 lb

## 2016-10-19 DIAGNOSIS — E663 Overweight: Secondary | ICD-10-CM | POA: Diagnosis not present

## 2016-10-19 MED ORDER — CYANOCOBALAMIN 1000 MCG/ML IJ SOLN
1000.0000 ug | Freq: Once | INTRAMUSCULAR | Status: AC
  Administered 2016-10-19: 1000 ug via INTRAMUSCULAR

## 2016-10-19 NOTE — Progress Notes (Signed)
Patient ID: Mercedes Webb, female   DOB: 1955/08/19, 61 y.o.   MRN: [id number] Pt presents for weight, B/P, B-12 injection. No side effects of medication-Phentermine, or B-12.  Weight gain of 3 lbs. Encouraged eating healthy and exercise. Pt questioned about her potassium rx that it was for #14 with 1 refill. Wondered why #30 wasn't ordered. I asked pt to send you questions if she had more questions.

## 2016-10-29 ENCOUNTER — Other Ambulatory Visit: Payer: Self-pay | Admitting: Obstetrics and Gynecology

## 2016-11-12 ENCOUNTER — Encounter: Payer: Self-pay | Admitting: Family Medicine

## 2016-11-12 ENCOUNTER — Ambulatory Visit (INDEPENDENT_AMBULATORY_CARE_PROVIDER_SITE_OTHER): Payer: 59 | Admitting: Family Medicine

## 2016-11-12 VITALS — BP 129/85 | HR 92 | Ht 67.0 in | Wt 179.0 lb

## 2016-11-12 DIAGNOSIS — F4323 Adjustment disorder with mixed anxiety and depressed mood: Secondary | ICD-10-CM

## 2016-11-12 DIAGNOSIS — F5101 Primary insomnia: Secondary | ICD-10-CM | POA: Diagnosis not present

## 2016-11-12 DIAGNOSIS — I10 Essential (primary) hypertension: Secondary | ICD-10-CM

## 2016-11-12 DIAGNOSIS — Z79899 Other long term (current) drug therapy: Secondary | ICD-10-CM | POA: Diagnosis not present

## 2016-11-12 DIAGNOSIS — E782 Mixed hyperlipidemia: Secondary | ICD-10-CM | POA: Diagnosis not present

## 2016-11-12 DIAGNOSIS — E349 Endocrine disorder, unspecified: Secondary | ICD-10-CM | POA: Insufficient documentation

## 2016-11-12 DIAGNOSIS — Z780 Asymptomatic menopausal state: Secondary | ICD-10-CM | POA: Insufficient documentation

## 2016-11-12 DIAGNOSIS — E1169 Type 2 diabetes mellitus with other specified complication: Secondary | ICD-10-CM | POA: Insufficient documentation

## 2016-11-12 DIAGNOSIS — F39 Unspecified mood [affective] disorder: Secondary | ICD-10-CM | POA: Insufficient documentation

## 2016-11-12 MED ORDER — FLUOXETINE HCL 20 MG PO TABS: 20.0000 mg | ORAL_TABLET | Freq: Every day | ORAL | 0 refills | Status: DC

## 2016-11-12 NOTE — Patient Instructions (Signed)
If you have insomnia or difficulty sleeping, this information is for you:  - Avoid caffeinated beverages after lunch,  no alcoholic beverages,  no eating within 2-3 hours of lying down,  avoid exposure to blue light before bed,  avoid daytime naps, and  needs to maintain a regular sleep schedule- go to sleep and wake up around the same time every night.   - Resolve concerns or worries before entering bedroom:  Discussed relaxation techniques with patient and to keep a journal to write down fears\ worries.  I suggested seeing a counselor for CBT.   - Recommend patient meditate or do deep breathing exercises to help relax.   Incorporate the use of white noise machines or listen to "sleep meditation music", or recordings of guided meditations for sleep from YouTube which are free, such as  "guided meditation for detachment from over thinking"  by Michael Sealey.         What is Chronic Stress Syndrome, Symptoms & Ways to Deal With it   What is Chronic Stress Syndrome?  Chronic Stress Syndrome is something which can now be called as a medical condition due to the amount of stress an individual is going through these days. Chronic Stress Syndrome causes the body and mind to shutdown and the person has no control over himself or herself. Due to the demands of modern day life and the hardship throughout day and night takes its toll over a period of time and the body and brain starts demanding rest and a break. This leads to certain symptoms where your performance level starts to dip at work, you become irritable both at work and at home, you may stop enjoying activities you previously liked, you may become depressed, you may get angry for even small things. Chronic Stress Syndrome can significantly impact your quality life. Thus it is important understand the symptoms of Chronic Stress Syndrome and react accordingly in order to cope up with it.  It is important to note here that a balanced work-home  equation should be drawn to cut down symptoms of Chronic Stress Syndrome. Minor stressors can be overcome by the body's inbuilt stress response but when there is unending stress for a long period of time then an external help is required to ease the stress.  Chronic Stress Syndrome can physically and psychologically drain you over a period of time. For such cases stress management is the best way to cope up with Chronic Stress Syndrome. If Chronic Stress Syndrome is not treated then it may result in many health hazards like anxiety, muscle pain, insomnia, and high blood pressure along with a compromised immune system leading to frequent infections and missed days from work.    What are the Symptoms of Chronic Stress Syndrome?   The symptoms of Chronic Stress Syndrome are variable and range from generalized symptoms to emotional symptoms along with behavioral and cognitive symptoms. Some of these symptoms have been delineated below:  Generalized Symptoms of Chronic Stress Syndrome are: Anxiety Depression Social isolation Headache Abdominal pain Lack of sleep Back pain Difficulty in concentrating Hypertension Hemorrhoids Varicose veins Panic attacks/ Panic disorder Cardiovascular diseases.   Some of the Emotional Symptoms of Chronic Stress Syndrome are: To become easily agitated, moody and frustrated Feeling overwhelmed which makes you feel like you are losing control. Having difficulty relaxing and have a peaceful mind Having low self esteem Feeling lonely Feeling worthless Feeling depressed Avoiding social environment.   Some of the Physical Symptoms of Chronic Stress   Syndrome are: Headaches Lethargy Alternating diarrhea and constipation Nausea Muscles aches and pains Insomnia Rapid heartbeat and chest pain Infections and frequent colds Decreased libido Nervousness and shaking Tinnitus Sweaty palms Dry mouth Clenched jaw.  Some of the Cognitive Symptoms of  Chronic Stress Syndrome are: Constant worrying Racing thoughts Disorganization and forgetfulness Inability to focus Poor judgment Abundance of negativity.  Some of the Behavioral Symptoms of Chronic Stress Syndrome are: Changes in appetite with less desire to eat Avoiding responsibilities Indulgence in alcohol or recreational drug use Increased nail biting and being fidgety Ways to Deal With Chronic Stress Syndrome    Chronic Stress Syndrome is not something which cannot be addressed. A bit of effort from your side in the form of lifestyle modifications, a little bit of exercise, a balanced work life equation can do wonders and help you get rid of Chronic Stress Syndrome.  Get Proper Sleep: It has been proved that Chronic Stress Syndrome causes loss of sleep where an individual may not even be able to sleep for days unending. This may result in the individual feeling lethargic and unable to focus at work the following morning. This may lead to decreased performance at work. Thus, it is important to have a good sleep-wake cycle. For this, try and not drink any caffeinated beverage about four hours prior to going to sleep, as caffeine pumps up the adrenaline and causes you to stay awake resulting ultimately in Chronic Stress Syndrome.  Avoid Alcohol and Drugs: Another way to get rid of Chronic Stress Syndrome is lifestyle modifications. Stay away from alcohol and other recreational drugs. Take Short Frequent Breaks at Work: Try to take frequent breaks from work and do not work continuously. Try and manage your work in such a way that you even meet your deadline and come home on time for a happy dinner with family. A good time spent with family and kids does wonders in not only dealing with Chronic Stress Syndrome but also preventing it.  Become Physically Active: Another step towards getting rid of Chronic Stress Syndrome is physical activity. If you do not have time to spend at the gym then at  least try and go for daily walks for about half an hour a day which not only keeps the stress away but also is good for your overall health. Physical activity leads to production of endorphins which will make you feel relaxed and feel good.  Healthy Diet Can Help You Deal With Chronic Stress Syndrome: Have a balanced and healthy diet is another step towards a stress free life and keeping Chronic Stress Syndrome at bay. If time is a constraint then you can try eating three small meals a day. Try and avoid fast foods and take foods which are healthy and rich in proteins, fiber, and carbohydrates to boost your energy system.  Music Can Soothe Your Mind: Light music is one of the best and most effective relaxation techniques that one can try to overcome stress. It has shown to calm down the mind and take you away from all the stressors that you may be having. These days it is also being used as a therapy in some institutes for overcoming stress. It is important here to discuss the importance of a good social support system for patients with Chronic Stress Syndrome, as a good social support framework can do wonders in taking the stress away from the patient and overcoming Chronic Stress Syndrome.  Meditation Can Help You Deal With Chronic Stress Syndrome   Effectively: Meditation and yoga has also shown to be quite effective in relaxing the mind and coping up with Chronic Stress Syndrome   In cases where these measures are not helpful, then it is time for you to consult with a skilled psychologist or a psychiatrist for potential therapies or medications to control the stress response.   The psychologist can help you with a variety of steps for coping up with Chronic Stress Syndrome. Relaxation techniques and behavioral therapy are some of the methods employed by psychologists. In some cases, medications can also be given to help relax the patient.  Since Chronic Stress Syndrome is both emotionally and physically  draining for the patient and it also adversely affects the family life of the patient hence it is important for the patient to recognize the condition and taking steps to cope up with it. Escaping measures like alcohol and drug use are of no help as they only aggravate the condition apart from their other health hazards. If this condition is ignored or left untreated it can lead to various medical conditions like anxiety and depression and various other medical conditions.  Last but not least, smile as often as you can as it is the best gift that you can give to someone. The best way to stay relaxed is to have a good smile, exercise daily, spend time with your family, meditation and if required consultation with a good psychologist so that you can live a stress free life and overcome the symptoms of Chronic Stress Syndrome. 

## 2016-11-12 NOTE — Progress Notes (Signed)
New patient office visit note:  Impression and Recommendations:    1. Adjustment disorder with mixed anxiety and depressed mood   2. Postmenopausal- 30 yrs ago TAH "yrs ago"   3. Hormone imbalance- txed by gyn   4. Mixed hyperlipidemia   5. Hypertension, benign essential, goal below 140/90   6. Primary insomnia   7. High risk medication use    1. Start fluoxetine.  20 mg daily.  If anything slightly side effects take every other day.  For 1 week then follow-up after 2 weeks.  Take after breakfast or after a meal once daily.   2. Discussed other healthy habits to help with stress management and sleep disorder.   We discussed listening to his sleep meditation nightly before bed to help her mind focus on something else is voice other than having it race and worry about the stressors from her day 3. She will continue with her GYN for her hormone imbalance issues. 4. She'll continue with her atorvastatin for her cholesterol.  She was recently placed on it 09/28/2016.  We will make sure she has follow-up ALT in the near future. 5. Blood pressure is basically at goal for age.  Discussed healthy habits of walking daily, weight loss etc. 6. C sleep hygiene handout I gave patient.  The patient was counseled, risk factors were discussed, anticipatory guidance given.   New Prescriptions   No medications on file     Discontinued Medications   ASPIRIN 81 MG CHEWABLE TABLET       PHENTERMINE (ADIPEX-P) 37.5 MG TABLET    Take 1 tablet (37.5 mg total) by mouth daily before breakfast.   POTASSIUM CHLORIDE (K-DUR) 10 MEQ TABLET    TAKE 1 TABLET (10 MEQ TOTAL) BY MOUTH DAILY.   TRIAMTERENE-HYDROCHLOROTHIAZIDE (MAXZIDE-25) 37.5-25 MG PER TABLET    Take 1 tablet by mouth daily.       Orders Placed This Encounter  Procedures  . ALT     Gross side effects, risk and benefits, and alternatives of medications discussed with patient.  Patient is aware that all medications have potential side  effects and we are unable to predict every side effect or drug-drug interaction that may occur.  Expresses verbal understanding and consents to current therapy plan and treatment regimen.  Return in about 2 weeks (around 11/26/2016) for Follow-up starting fluoxetine, discuss mood, sleep, stress level.  Please see AVS handed out to patient at the end of our visit for further patient instructions/ counseling done pertaining to today's office visit.    Note: This document was prepared using Dragon voice recognition software and may include unintentional dictation errors.  ----------------------------------------------------------------------------------------------------------------------    Subjective:    Chief complaint:   Chief Complaint  Patient presents with  . Establish Care     HPI: Mercedes Webb is a pleasant 61 y.o. female who presents to [facility] [facility] today to review their medical history with me and establish care.   I asked the patient to review their chronic problem list with me to ensure everything was updated and accurate.    All recent office visits with other providers, any medical records that patient brought in etc  - I reviewed today.     Also asked pt to get me medical records from [facility] providers/ specialists that they had seen within the past 3-5 years- if they are in private practice and/or do not work for a [employer], [facility], [place],  [facility] or [employer] owned [employer].  Told them to call their specialists to clarify this if they are not sure.   Past PCP- Myra Hammrick- she left practice.  Just saw [facility] [place],  [facility] in [place].  See's her for hormone imbalance  Pt has depression/ anxiety:    Takes xanax TID; pt with a lot of stress at work and home.   Trouble sleeping- took [place] CR about 4 yrs ago.  Sev others tried- didnt; work well  No problems updated.    Wt  Readings from Last 3 Encounters:  05/28/17 197 lb (89.4 kg)  01/21/17 186 lb 9.6 oz (84.6 kg)  01/17/17 185 lb 1.6 oz (84 kg)   BP Readings from Last 3 Encounters:  05/28/17 121/79  02/15/17 117/74  01/21/17 (!) 147/92   Pulse Readings from Last 3 Encounters:  05/28/17 92  02/15/17 89  01/21/17 87   BMI Readings from Last 3 Encounters:  05/28/17 30.85 kg/m  01/21/17 29.23 kg/m  01/17/17 28.99 kg/m    Patient Care Team    Relationship Specialty Notifications Start End  Mellody Dance, DO PCP - General Family Medicine  11/26/16   Defrancesco, Alanda Slim, MD Consulting Physician Obstetrics and Gynecology  11/12/16   Joylene Igo, CNM Midwife Obstetrics and Gynecology  11/12/16   Danella Sensing, MD Consulting Physician Dermatology  11/12/16   Richmond Campbell, MD Consulting Physician Gastroenterology  11/26/16   Carol Ada, MD Consulting Physician Gastroenterology  11/26/16   Keene Breath., MD  Ophthalmology  11/26/16   Joylene Igo, CNM Midwife Obstetrics and Gynecology  11/26/16    Comment: Prescribes her Seroquel    Patient Active Problem List   Diagnosis Date Noted  . Primary insomnia 11/12/2016    Priority: High  . Adjustment disorder with mixed anxiety and depressed mood 11/12/2016    Priority: High  . Elevated cholesterol with elevated triglycerides 09/28/2016    Priority: High  . Essential hypertension 01/31/2016    Priority: High  . Generalized anxiety disorder 01/31/2016    Priority: High  . Postmenopausal- 30 yrs ago TAH "yrs ago" 11/12/2016    Priority: Medium  . Hormone imbalance- txed by gyn 11/12/2016    Priority: Medium  . Mixed hyperlipidemia 11/12/2016    Priority: Medium  . Hypokalemia 02/01/2016    Priority: Medium  . Vitamin D deficiency 09/28/2016    Priority: Low  . Acute biliary pancreatitis 01/31/2016  . Overweight 12/23/2014  . Surgical menopause 12/23/2014  . Status post abdominal hysterectomy 12/23/2014  . History of  cervical dysplasia 12/23/2014  . SUI (stress urinary incontinence, female) 12/23/2014     Past Medical History:  Diagnosis Date  . Anxiety   . Cancer ([place]) 2016   Skin CA squamous cell on bilateral feet removed  . Cervical dysplasia    h/o  . Colon polyps    polyps on first scope ~ 2006, no recurrent polyps ~ 2001 and in 2016.   . Endometriosis   . Family history of adverse reaction to anesthesia    Mother had difficulty waking & nausea  . Headache   . Hypertension   . Insomnia   . Pancreatitis 01/2016  . Recurrent cold sores      Past Medical History:  Diagnosis Date  . Anxiety   . Cancer ([place]) 2016   Skin CA squamous cell on bilateral feet removed  . Cervical dysplasia    h/o  .  Colon polyps    polyps on first scope ~ 2006, no recurrent polyps ~ 2001 and in 2016.   . Endometriosis   . Family history of adverse reaction to anesthesia    Mother had difficulty waking & nausea  . Headache   . Hypertension   . Insomnia   . Pancreatitis 01/2016  . Recurrent cold sores      Past Surgical History:  Procedure Laterality Date  . ABDOMINAL HYSTERECTOMY     tah/bso- endometriosis  . CHOLECYSTECTOMY    . ERCP N/A 02/03/2016   Procedure: ENDOSCOPIC RETROGRADE CHOLANGIOPANCREATOGRAPHY (ERCP);  Surgeon: Carol Ada, MD;  Location: [facility] END[facility]COPY;  Service: Endoscopy;  Laterality: N/A;  . KNEE ARTHROSCOPY    . tonsillectomy    . TONSILLECTOMY    . TUBAL LIGATION       Family History  Problem Relation Age of Onset  . Diabetes Father   . Heart disease Father   . Breast cancer Sister 74  . Diabetes Brother   . Colon cancer Neg Hx   . Ovarian cancer Neg Hx      Social History   Substance and Sexual Activity  Drug Use No     Social History   Substance and Sexual Activity  Alcohol Use Yes   Comment: occas     Social History   Tobacco Use  Smoking Status Former Smoker  . Last attempt to quit: 04/30/1988  . Years since quitting: 29.0  Smokeless Tobacco  Never Used     Outpatient Encounter Medications as of 11/12/2016  Medication Sig  . Ascorbic Acid (VITAMIN C) 1000 MG tablet Take 1,000 mg by mouth daily.  . Calcium Carbonate (CALTRATE 600 PO) Take 1 tablet by mouth daily.  . cyanocobalamin (,VITAMIN B-12,) 1000 MCG/ML injection Inject 1 mL (1,000 mcg total) into the muscle every 30 (thirty) days.  Marland Kitchen ibuprofen (ADVIL,MOTRIN) 800 MG tablet Take 800 mg by mouth every 8 (eight) hours as needed for moderate pain.   . meclizine (ANTIVERT) 12.5 MG tablet Take 12.5 mg by mouth 3 (three) times daily as needed for dizziness or nausea.  . Melatonin (MELATONIN MAXIMUM STRENGTH) 5 MG TABS Take 5 mg by mouth at bedtime as needed (sleep).   . Multiple Vitamins-Minerals (CENTRUM SILVER PO) Take 1 tablet by mouth daily.  . Omega-3 Fatty Acids (FISH OIL) 1000 MG CAPS Take 1 capsule by mouth daily.  . valACYclovir (VALTREX) 500 MG tablet   . [DISCONTINUED] ALPRAZolam (XANAX) 1 MG tablet Take 1 mg by mouth 3 (three) times daily as needed for anxiety.   . [DISCONTINUED] aspirin EC 81 MG tablet Take 1 tablet (81 mg total) by mouth daily.  . [DISCONTINUED] atorvastatin (LIPITOR) 10 MG tablet Take 1 tablet (10 mg total) by mouth daily.  . [DISCONTINUED] estradiol (ESTRACE) 0.5 MG tablet Take 1 tablet (0.5 mg total) by mouth daily.  . [DISCONTINUED] phentermine (ADIPEX-P) 37.5 MG tablet Take 1 tablet (37.5 mg total) by mouth daily before breakfast.  . [DISCONTINUED] potassium chloride (K-DUR) 10 MEQ tablet TAKE 1 TABLET (10 MEQ TOTAL) BY MOUTH DAILY.  . [DISCONTINUED] progesterone (PROMETRIUM) 200 MG capsule Take 1 capsule (200 mg total) by mouth daily.  . [DISCONTINUED] triamterene-hydrochlorothiazide (MAXZIDE-25) 37.5-25 MG per tablet Take 1 tablet by mouth daily.   . [DISCONTINUED] Vitamin D, Ergocalciferol, (DRISDOL) 50000 units CAPS capsule Take 1 capsule (50,000 Units total) by mouth every 7 (seven) days.  . [DISCONTINUED] aspirin 81 MG chewable tablet   .  [DISCONTINUED] FLUoxetine (  PROZAC) 20 MG tablet Take 1 tablet (20 mg total) by mouth daily. Caps are Okay Substitution   No facility-administered encounter medications on file as of 11/12/2016.     Allergies: Patient has no known allergies.   ROS   Objective:   Blood pressure 129/85, pulse 92, height 5\' 7"  (1.702 m), weight 179 lb (81.2 kg). Body mass index is 28.04 kg/m. General: Well Developed, well nourished, and in no acute distress.  Neuro: Alert and oriented x3, extra-ocular muscles intact, sensation grossly intact.  HEENT:[place]/AT, PERRLA, neck supple, No carotid bruits Skin: no gross rashes  Cardiac: Regular rate and rhythm Respiratory: Essentially clear to auscultation bilaterally. Not using accessory muscles, speaking in full sentences.  Abdominal: not grossly distended Musculoskeletal: Ambulates w/o diff, FROM * 4 ext.  Vasc: less 2 sec cap RF, warm and pink  Psych:  No HI/SI, judgement and insight good, Euthymic mood. Full Affect.    No results found for this or any previous visit (from the past 2160 hour(s)).

## 2016-11-16 ENCOUNTER — Ambulatory Visit (INDEPENDENT_AMBULATORY_CARE_PROVIDER_SITE_OTHER): Payer: 59 | Admitting: Obstetrics and Gynecology

## 2016-11-16 ENCOUNTER — Encounter: Payer: Self-pay | Admitting: Obstetrics and Gynecology

## 2016-11-16 VITALS — BP 136/84 | HR 98 | Wt 179.3 lb

## 2016-11-16 DIAGNOSIS — E663 Overweight: Secondary | ICD-10-CM

## 2016-11-16 MED ORDER — CYANOCOBALAMIN 1000 MCG/ML IJ SOLN
1000.0000 ug | Freq: Once | INTRAMUSCULAR | Status: AC
  Administered 2016-11-16: 1000 ug via INTRAMUSCULAR

## 2016-11-16 NOTE — Progress Notes (Signed)
Pt is here for wt, bp check, b- 12 inj, she denies any s/e, she has gained weight this month, is going to do better next month,pt is requesting a refill on her phentermine   11/16/16 wt- 179.3lb 10/19/16 wt- 174lb

## 2016-11-19 ENCOUNTER — Ambulatory Visit
Admission: RE | Admit: 2016-11-19 | Discharge: 2016-11-19 | Disposition: A | Discharge status: Home / Self Care | Payer: 59 | Source: Ambulatory Visit | Attending: Obstetrics and Gynecology | Admitting: Obstetrics and Gynecology

## 2016-11-19 ENCOUNTER — Ambulatory Visit: Payer: 59

## 2016-11-19 ENCOUNTER — Other Ambulatory Visit: Payer: Self-pay | Admitting: Obstetrics and Gynecology

## 2016-11-19 DIAGNOSIS — Z1231 Encounter for screening mammogram for malignant neoplasm of breast: Secondary | ICD-10-CM

## 2016-11-22 ENCOUNTER — Other Ambulatory Visit: Payer: Self-pay | Admitting: Obstetrics and Gynecology

## 2016-11-26 ENCOUNTER — Encounter: Payer: Self-pay | Admitting: Family Medicine

## 2016-11-26 ENCOUNTER — Ambulatory Visit (INDEPENDENT_AMBULATORY_CARE_PROVIDER_SITE_OTHER): Payer: 59 | Admitting: Family Medicine

## 2016-11-26 DIAGNOSIS — F5101 Primary insomnia: Secondary | ICD-10-CM

## 2016-11-26 DIAGNOSIS — F4323 Adjustment disorder with mixed anxiety and depressed mood: Secondary | ICD-10-CM | POA: Diagnosis not present

## 2016-11-26 MED ORDER — FLUOXETINE HCL 20 MG PO TABS: 20.0000 mg | ORAL_TABLET | Freq: Two times a day (BID) | ORAL | 2 refills | Status: DC

## 2016-11-26 NOTE — Progress Notes (Signed)
Impression and Recommendations:    1. Primary insomnia   2. Adjustment disorder with mixed anxiety and depressed mood    - inc prozac to BID from once daily - cont to try to ween xanax usage. - cont sleep meditation - Exercise and diet changes discussed  The patient was counseled, risk factors were discussed, anticipatory guidance given.   New Prescriptions   No medications on file     Discontinued Medications   FLUOXETINE (PROZAC) 20 MG CAPSULE    Take 1 capsule by mouth daily.   PHENTERMINE (ADIPEX-P) 37.5 MG TABLET    Take 1 tablet (37.5 mg total) by mouth daily before breakfast.   POTASSIUM CHLORIDE (K-DUR) 10 MEQ TABLET    TAKE 1 TABLET (10 MEQ TOTAL) BY MOUTH DAILY.     Modified Medications   Modified Medication Previous Medication   ASPIRIN EC 81 MG TABLET aspirin EC 81 MG tablet      Take 1 tablet (81 mg total) by mouth daily.    Take 1 tablet (81 mg total) by mouth daily.   ATORVASTATIN (LIPITOR) 10 MG TABLET atorvastatin (LIPITOR) 10 MG tablet      Take 1 tablet (10 mg total) by mouth daily.    Take 1 tablet (10 mg total) by mouth daily.   FLUOXETINE (PROZAC) 20 MG TABLET FLUoxetine (PROZAC) 20 MG tablet      Take 1 tablet (20 mg total) by mouth 2 (two) times daily. Caps are Okay Substitution    Take 1 tablet (20 mg total) by mouth daily. Caps are Okay Substitution   POTASSIUM CHLORIDE (K-DUR) 10 MEQ TABLET potassium chloride (K-DUR) 10 MEQ tablet      Take 1 tablet (10 mEq total) by mouth daily.    Take 1 tablet (10 mEq total) by mouth daily.   QUETIAPINE (SEROQUEL) 50 MG TABLET QUEtiapine (SEROQUEL) 50 MG tablet      Take 1 tablet (50 mg total) by mouth at bedtime.    Take 1 tablet (50 mg total) by mouth at bedtime.   VITAMIN D, ERGOCALCIFEROL, (DRISDOL) 50000 UNITS CAPS CAPSULE Vitamin D, Ergocalciferol, (DRISDOL) 50000 units CAPS capsule      Take 1 capsule (50,000 Units total) by mouth every 7 (seven) days.    Take 1 capsule (50,000 Units total) by mouth  every 7 (seven) days.     Gross side effects, risk and benefits, and alternatives of medications and treatment plan in general discussed with patient.  Patient is aware that all medications have potential side effects and we are unable to predict every side effect or drug-drug interaction that may occur.   Patient will call with any questions prior to using medication if they have concerns.  Expresses verbal understanding and consents to current therapy and treatment regimen.  No barriers to understanding were identified.  Red flag symptoms and signs discussed in detail.  Patient expressed understanding regarding what to do in case of emergency\urgent symptoms  Please see AVS handed out to patient at the end of our visit for further patient instructions/ counseling done pertaining to today's office visit.   Return in about 8 weeks (around 01/21/2017) for mood f/up.     Note: This document was prepared using Dragon voice recognition software and may include unintentional dictation errors.  Mercedes Webb 4:06 PM --------------------------------------------------------------------------------------------------------------------------------------------------------------------------------------------------------------------------------------------    Subjective:    CC:  Chief Complaint  Patient presents with  . Follow-up    HPI: Mercedes Webb is  a 61 y.o. female who presents to [place] at [facility] today for follow-up of mood.   On 7/16- started prozac 7/16--> tol well but thinks not strong enough.  Feels like it wears off mid-day  Only taking xanax twice daily now- was on TID prior to starting prozac.    Depression screen [facility] 2/9 11/26/2016  Decreased Interest 0  Down, Depressed, Hopeless 0  PHQ - 2 Score 0  Altered sleeping 1  Tired, decreased energy 1  Change in appetite 1  Feeling bad or failure about yourself  0  Trouble concentrating 0  Moving  slowly or fidgety/restless 0  Suicidal thoughts 0  PHQ-9 Score 3     GAD 7 : Generalized Anxiety Score 11/26/2016  Nervous, Anxious, on Edge 1  Control/stop worrying 1  Worry too much - different things 1  Trouble relaxing 1  Restless 1  Easily annoyed or irritable 1  Afraid - awful might happen 0  Total GAD 7 Score 6  Anxiety Difficulty Somewhat difficult      No problems updated.   Wt Readings from Last 3 Encounters:  01/17/17 185 lb 1.6 oz (84 kg)  01/14/17 185 lb 1.6 oz (84 kg)  12/14/16 180 lb (81.6 kg)   BP Readings from Last 3 Encounters:  01/17/17 138/68  01/14/17 128/75  12/14/16 130/84   Pulse Readings from Last 3 Encounters:  01/17/17 89  01/14/17 86  12/14/16 88   BMI Readings from Last 3 Encounters:  01/17/17 28.99 kg/m  01/14/17 28.99 kg/m  12/14/16 28.19 kg/m     Patient Care Team    Relationship Specialty Notifications Start End  Mercedes Dance, DO PCP - General Family Medicine  11/26/16   Defrancesco, Alanda Slim, MD Consulting Physician Obstetrics and Gynecology  11/12/16   Joylene Igo, CNM Midwife Obstetrics and Gynecology  11/12/16   Danella Sensing, MD Consulting Physician Dermatology  11/12/16   Richmond Campbell, MD Consulting Physician Gastroenterology  11/26/16   Carol Ada, MD Consulting Physician Gastroenterology  11/26/16   Keene Breath., MD  Ophthalmology  11/26/16   Joylene Igo, CNM Midwife Obstetrics and Gynecology  11/26/16      Patient Active Problem List   Diagnosis Date Noted  . Postmenopausal- 30 yrs ago TAH "yrs ago" 11/12/2016    Priority: High  . Hormone imbalance- txed by gyn 11/12/2016    Priority: High  . Mixed hyperlipidemia 11/12/2016  . Hypertension, benign essential, goal below 140/90 11/12/2016  . Primary insomnia 11/12/2016  . Adjustment disorder with mixed anxiety and depressed mood 11/12/2016  . Elevated cholesterol with elevated triglycerides 09/28/2016  . Vitamin D deficiency 09/28/2016  .  Hypokalemia 02/01/2016  . Acute biliary pancreatitis 01/31/2016  . Essential hypertension 01/31/2016  . Generalized anxiety disorder 01/31/2016  . Overweight 12/23/2014  . Surgical menopause 12/23/2014  . Status post abdominal hysterectomy 12/23/2014  . History of cervical dysplasia 12/23/2014  . SUI (stress urinary incontinence, female) 12/23/2014    Past Medical history, Surgical history, Family history, Social history, Allergies and Medications have been entered into the medical record, reviewed and changed as needed.    Current Meds  Medication Sig  . ALPRAZolam (XANAX) 1 MG tablet Take 1 mg by mouth 3 (three) times daily as needed for anxiety.   . Ascorbic Acid (VITAMIN C) 1000 MG tablet Take 1,000 mg by mouth daily.  . Calcium Carbonate (CALTRATE 600 PO) Take 1 tablet by mouth daily.  . cyanocobalamin (,  VITAMIN B-12,) 1000 MCG/ML injection Inject 1 mL (1,000 mcg total) into the muscle every 30 (thirty) days.  Marland Kitchen estradiol (ESTRACE) 0.5 MG tablet Take 1 tablet (0.5 mg total) by mouth daily.  Marland Kitchen FLUoxetine (PROZAC) 20 MG tablet Take 1 tablet (20 mg total) by mouth 2 (two) times daily. Caps are Okay Substitution (Patient taking differently: Take 20 mg by mouth 2 (two) times daily. Caps are Okay Substitution.  Patient takes 1 capsule daily)  . ibuprofen (ADVIL,MOTRIN) 800 MG tablet Take 800 mg by mouth every 8 (eight) hours as needed for moderate pain.   . meclizine (ANTIVERT) 12.5 MG tablet Take 12.5 mg by mouth 3 (three) times daily as needed for dizziness or nausea.  . Melatonin (MELATONIN MAXIMUM STRENGTH) 5 MG TABS Take 5 mg by mouth at bedtime as needed (sleep).   . Multiple Vitamins-Minerals (CENTRUM SILVER PO) Take 1 tablet by mouth daily.  . Omega-3 Fatty Acids (FISH OIL) 1000 MG CAPS Take 1 capsule by mouth daily.  . progesterone (PROMETRIUM) 200 MG capsule Take 1 capsule (200 mg total) by mouth daily.  Marland Kitchen triamterene-hydrochlorothiazide (MAXZIDE-25) 37.5-25 MG per tablet Take  1 tablet by mouth daily.   . valACYclovir (VALTREX) 500 MG tablet   . [DISCONTINUED] aspirin EC 81 MG tablet Take 1 tablet (81 mg total) by mouth daily.  . [DISCONTINUED] atorvastatin (LIPITOR) 10 MG tablet Take 1 tablet (10 mg total) by mouth daily.  . [DISCONTINUED] FLUoxetine (PROZAC) 20 MG tablet Take 1 tablet (20 mg total) by mouth daily. Caps are Okay Substitution  . [DISCONTINUED] phentermine (ADIPEX-P) 37.5 MG tablet Take 1 tablet (37.5 mg total) by mouth daily before breakfast.  . [DISCONTINUED] potassium chloride (K-DUR) 10 MEQ tablet TAKE 1 TABLET (10 MEQ TOTAL) BY MOUTH DAILY.  . [DISCONTINUED] Vitamin D, Ergocalciferol, (DRISDOL) 50000 units CAPS capsule Take 1 capsule (50,000 Units total) by mouth every 7 (seven) days.    Allergies:  No Known Allergies   Review of Systems: General:   Denies fever, chills, unexplained weight loss.  Optho/Auditory:   Denies visual changes, blurred vision/LOV Respiratory:   Denies wheeze, DOE more than baseline levels.  Cardiovascular:   Denies chest pain, palpitations, new onset peripheral edema  Gastrointestinal:   Denies nausea, vomiting, diarrhea, abd pain.  Genitourinary: Denies dysuria, freq/ urgency, flank pain or discharge from genitals.  Endocrine:     Denies hot or cold intolerance, polyuria, polydipsia. Musculoskeletal:   Denies unexplained myalgias, joint swelling, unexplained arthralgias, gait problems.  Skin:  Denies new onset rash, suspicious lesions Neurological:     Denies dizziness, unexplained weakness, numbness  Psychiatric/Behavioral:   Denies mood changes, suicidal or homicidal ideations, hallucinations    Objective:   Blood pressure (!) 148/82, pulse 98, height 5\' 7"  (1.702 m), weight 178 lb 1.6 oz (80.8 kg). Body mass index is 27.89 kg/m. General:  Well Developed, well nourished, appropriate for stated age.  Neuro:  Alert and oriented,  extra-ocular muscles intact  HEENT:  Normocephalic, atraumatic, neck supple,  no carotid bruits appreciated  Skin:  no gross rash, warm, pink. Cardiac:  RRR, S1 S2 Respiratory:  ECTA B/L and A/P, Not using accessory muscles, speaking in full sentences- unlabored. Vascular:  Ext warm, no cyanosis apprec.; cap RF less 2 sec. Psych:  No HI/SI, judgement and insight good, Euthymic mood. Full Affect.

## 2016-11-26 NOTE — Patient Instructions (Signed)
Start taking 2 prozac daily.   What is Chronic Stress Syndrome, Symptoms & Ways to Deal With it   What is Chronic Stress Syndrome?  Chronic Stress Syndrome is something which can now be called as a medical condition due to the amount of stress an individual is going through these days. Chronic Stress Syndrome causes the body and mind to shutdown and the person has no control over himself or herself. Due to the demands of modern day life and the hardship throughout day and night takes its toll over a period of time and the body and brain starts demanding rest and a break. This leads to certain symptoms where your performance level starts to dip at work, you become irritable both at work and at home, you may stop enjoying activities you previously liked, you may become depressed, you may get angry for even small things. Chronic Stress Syndrome can significantly impact your quality life. Thus it is important understand the symptoms of Chronic Stress Syndrome and react accordingly in order to cope up with it.  It is important to note here that a balanced work-home equation should be drawn to cut down symptoms of Chronic Stress Syndrome. Minor stressors can be overcome by the body's inbuilt stress response but when there is unending stress for a long period of time then an external help is required to ease the stress.  Chronic Stress Syndrome can physically and psychologically drain you over a period of time. For such cases stress management is the best way to cope up with Chronic Stress Syndrome. If Chronic Stress Syndrome is not treated then it may result in many health hazards like anxiety, muscle pain, insomnia, and high blood pressure along with a compromised immune system leading to frequent infections and missed days from work.    What are the Symptoms of Chronic Stress Syndrome?   The symptoms of Chronic Stress Syndrome are variable and range from generalized symptoms to emotional symptoms along  with behavioral and cognitive symptoms. Some of these symptoms have been delineated below:  Generalized Symptoms of Chronic Stress Syndrome are: Anxiety Depression Social isolation Headache Abdominal pain Lack of sleep Back pain Difficulty in concentrating Hypertension Hemorrhoids Varicose veins Panic attacks/ Panic disorder Cardiovascular diseases.   Some of the Emotional Symptoms of Chronic Stress Syndrome are: To become easily agitated, moody and frustrated Feeling overwhelmed which makes you feel like you are losing control. Having difficulty relaxing and have a peaceful mind Having low self esteem Feeling lonely Feeling worthless Feeling depressed Avoiding social environment.   Some of the Physical Symptoms of Chronic Stress Syndrome are: Headaches Lethargy Alternating diarrhea and constipation Nausea Muscles aches and pains Insomnia Rapid heartbeat and chest pain Infections and frequent colds Decreased libido Nervousness and shaking Tinnitus Sweaty palms Dry mouth Clenched jaw.  Some of the Cognitive Symptoms of Chronic Stress Syndrome are: Constant worrying Racing thoughts Disorganization and forgetfulness Inability to focus Poor judgment Abundance of negativity.  Some of the Behavioral Symptoms of Chronic Stress Syndrome are: Changes in appetite with less desire to eat Avoiding responsibilities Indulgence in alcohol or recreational drug use Increased nail biting and being fidgety Ways to Deal With Chronic Stress Syndrome    Chronic Stress Syndrome is not something which cannot be addressed. A bit of effort from your side in the form of lifestyle modifications, a little bit of exercise, a balanced work life equation can do wonders and help you get rid of Chronic Stress Syndrome.  Get Proper Sleep: It  has been proved that Chronic Stress Syndrome causes loss of sleep where an individual may not even be able to sleep for days unending. This may  result in the individual feeling lethargic and unable to focus at work the following morning. This may lead to decreased performance at work. Thus, it is important to have a good sleep-wake cycle. For this, try and not drink any caffeinated beverage about four hours prior to going to sleep, as caffeine pumps up the adrenaline and causes you to stay awake resulting ultimately in Chronic Stress Syndrome.  Avoid Alcohol and Drugs: Another way to get rid of Chronic Stress Syndrome is lifestyle modifications. Stay away from alcohol and other recreational drugs. Take Short Frequent Breaks at Work: Try to take frequent breaks from work and do not work continuously. Try and manage your work in such a way that you even meet your deadline and come home on time for a happy dinner with family. A good time spent with family and kids does wonders in not only dealing with Chronic Stress Syndrome but also preventing it.  Become Physically Active: Another step towards getting rid of Chronic Stress Syndrome is physical activity. If you do not have time to spend at the gym then at least try and go for daily walks for about half an hour a day which not only keeps the stress away but also is good for your overall health. Physical activity leads to production of endorphins which will make you feel relaxed and feel good.  Healthy Diet Can Help You Deal With Chronic Stress Syndrome: Have a balanced and healthy diet is another step towards a stress free life and keeping Chronic Stress Syndrome at [place]. If time is a constraint then you can try eating three small meals a day. Try and avoid fast foods and take foods which are healthy and rich in proteins, fiber, and carbohydrates to boost your energy system.  Music Can Soothe Your Mind: Light music is one of the best and most effective relaxation techniques that one can try to overcome stress. It has shown to calm down the mind and take you away from all the stressors that you may be  having. These days it is also being used as a therapy in some institutes for overcoming stress. It is important here to discuss the importance of a good social support system for patients with Chronic Stress Syndrome, as a good social support framework can do wonders in taking the stress away from the patient and overcoming Chronic Stress Syndrome.  Meditation Can Help You Deal With Chronic Stress Syndrome Effectively: Meditation and yoga has also shown to be quite effective in relaxing the mind and coping up with Chronic Stress Syndrome   In cases where these measures are not helpful, then it is time for you to consult with a skilled psychologist or a psychiatrist for potential therapies or medications to control the stress response.   The psychologist can help you with a variety of steps for coping up with Chronic Stress Syndrome. Relaxation techniques and behavioral therapy are some of the methods employed by psychologists. In some cases, medications can also be given to help relax the patient.  Since Chronic Stress Syndrome is both emotionally and physically draining for the patient and it also adversely affects the family life of the patient hence it is important for the patient to recognize the condition and taking steps to cope up with it. Escaping measures like alcohol and drug use are of  no help as they only aggravate the condition apart from their other health hazards. If this condition is ignored or left untreated it can lead to various medical conditions like anxiety and depression and various other medical conditions.  Last but not least, smile as often as you can as it is the best gift that you can give to someone. The best way to stay relaxed is to have a good smile, exercise daily, spend time with your family, meditation and if required consultation with a good psychologist so that you can live a stress free life and overcome the symptoms of Chronic Stress Syndrome.

## 2016-12-07 ENCOUNTER — Other Ambulatory Visit: Payer: Self-pay | Admitting: Obstetrics and Gynecology

## 2016-12-07 ENCOUNTER — Telehealth: Payer: Self-pay

## 2016-12-07 NOTE — Telephone Encounter (Signed)
Pt states that she has been having episodes of breaking out in severe sweats since starting fluoxetine 20mg  twice daily.  Spoke with Dr. Raliegh Scarlet who recommends that she decrease dose to 0.5 tablet twice daily.  However, pt states that she was given capsules despite the fact that the RX was sent in for tablets.  Advised pt to decrease to one capsule daily.  Pt to call back if this does not resolve the problem.  Pt informed, expressed understanding and is agreeable.  [name], CMA

## 2016-12-14 ENCOUNTER — Ambulatory Visit (INDEPENDENT_AMBULATORY_CARE_PROVIDER_SITE_OTHER): Payer: 59 | Admitting: Obstetrics and Gynecology

## 2016-12-14 ENCOUNTER — Encounter: Payer: Self-pay | Admitting: Obstetrics and Gynecology

## 2016-12-14 VITALS — BP 130/84 | HR 88 | Ht 67.0 in | Wt 180.0 lb

## 2016-12-14 DIAGNOSIS — E663 Overweight: Secondary | ICD-10-CM | POA: Diagnosis not present

## 2016-12-14 MED ORDER — CYANOCOBALAMIN 1000 MCG/ML IJ SOLN
1000.0000 ug | Freq: Once | INTRAMUSCULAR | Status: AC
  Administered 2016-12-14: 1000 ug via INTRAMUSCULAR

## 2016-12-14 NOTE — Progress Notes (Signed)
Pt is here for wt, bp check, b-12 inj, pt hasnt been following her diet as well as she should be  12/14/16 wt- 180lb 11/16/16 wt-179lb

## 2016-12-20 ENCOUNTER — Other Ambulatory Visit: Payer: Self-pay | Admitting: *Deleted

## 2016-12-20 ENCOUNTER — Telehealth: Payer: Self-pay | Admitting: Obstetrics and Gynecology

## 2016-12-20 MED ORDER — POTASSIUM CHLORIDE ER 10 MEQ PO TBCR: 10.0000 meq | EXTENDED_RELEASE_TABLET | Freq: Every day | ORAL | 3 refills | Status: DC

## 2016-12-20 NOTE — Telephone Encounter (Signed)
Done-ac 

## 2016-12-20 NOTE — Telephone Encounter (Signed)
Patient called and said that the Pharmacy doesn't have a new script for the potassium for 30 tabs instead of 14.  Please call

## 2017-01-11 ENCOUNTER — Ambulatory Visit: Payer: 59 | Admitting: Obstetrics and Gynecology

## 2017-01-14 ENCOUNTER — Ambulatory Visit (INDEPENDENT_AMBULATORY_CARE_PROVIDER_SITE_OTHER): Payer: 59 | Admitting: Obstetrics and Gynecology

## 2017-01-14 VITALS — BP 128/75 | HR 86 | Ht 67.0 in | Wt 185.1 lb

## 2017-01-14 DIAGNOSIS — R899 Unspecified abnormal finding in specimens from other organs, systems and tissues: Secondary | ICD-10-CM

## 2017-01-14 DIAGNOSIS — R5383 Other fatigue: Secondary | ICD-10-CM

## 2017-01-14 DIAGNOSIS — E789 Disorder of lipoprotein metabolism, unspecified: Secondary | ICD-10-CM

## 2017-01-14 MED ORDER — CYANOCOBALAMIN 1000 MCG/ML IJ SOLN
1000.0000 ug | Freq: Once | INTRAMUSCULAR | Status: AC
  Administered 2017-01-14: 1000 ug via INTRAMUSCULAR

## 2017-01-14 NOTE — Progress Notes (Signed)
Pt presents for B12 injection, states that she is no longer doing phentermine as she was told that she needs to take a break. Pt today with a 5lb weight gain. Pt also requests to have repeat labs today as she is losing her insurance in the next week or so due to job loss. Labs ordered and drawn as pt was at the 3 month mark. To f/u with [facility] in the next few days before loss of insurance.

## 2017-01-15 LAB — LIPID PANEL
CHOL/HDL RATIO: 3.7 ratio (ref 0.0–4.4)
Cholesterol, Total: 191 mg/dL (ref 100–199)
HDL: 52 mg/dL (ref >39)
TRIGLYCERIDES: 408 mg/dL — AB (ref 0–149)

## 2017-01-15 LAB — VITAMIN D 25 HYDROXY (VIT D DEFICIENCY, FRACTURES): Vit D, 25-Hydroxy: 39.5 ng/mL (ref 30.0–100.0)

## 2017-01-15 LAB — POTASSIUM: Potassium: 5 mmol/L (ref 3.5–5.2)

## 2017-01-17 ENCOUNTER — Encounter: Payer: Self-pay | Admitting: Obstetrics and Gynecology

## 2017-01-17 ENCOUNTER — Ambulatory Visit: Payer: 59 | Admitting: Obstetrics and Gynecology

## 2017-01-17 VITALS — BP 138/68 | HR 89 | Wt 185.1 lb

## 2017-01-17 DIAGNOSIS — Z79899 Other long term (current) drug therapy: Secondary | ICD-10-CM

## 2017-01-17 MED ORDER — ATORVASTATIN CALCIUM 10 MG PO TABS: 10.0000 mg | ORAL_TABLET | Freq: Every day | ORAL | 4 refills | Status: DC

## 2017-01-17 MED ORDER — QUETIAPINE FUMARATE 50 MG PO TABS: 50.0000 mg | ORAL_TABLET | Freq: Every day | ORAL | 4 refills | Status: DC

## 2017-01-17 MED ORDER — ASPIRIN EC 81 MG PO TBEC: 81.0000 mg | DELAYED_RELEASE_TABLET | Freq: Every day | ORAL | 2 refills | Status: DC

## 2017-01-17 MED ORDER — VITAMIN D (ERGOCALCIFEROL) 1.25 MG (50000 UNIT) PO CAPS: 50000.0000 [IU] | ORAL_CAPSULE | ORAL | 1 refills | Status: DC

## 2017-01-17 MED ORDER — QUETIAPINE FUMARATE 50 MG PO TABS: 50.0000 mg | ORAL_TABLET | Freq: Every day | ORAL | 0 refills | Status: DC

## 2017-01-17 MED ORDER — POTASSIUM CHLORIDE ER 10 MEQ PO TBCR: 10.0000 meq | EXTENDED_RELEASE_TABLET | Freq: Every day | ORAL | 3 refills | Status: DC

## 2017-01-17 NOTE — Progress Notes (Unsigned)
Subjective:     Patient ID: Mercedes Webb, female   DOB: 1955-11-16, 61 y.o.   MRN: [id number]  HPI   Review of Systems     Objective:   Physical Exam     Assessment:     ***    Plan:     ***

## 2017-01-21 ENCOUNTER — Encounter: Payer: Self-pay | Admitting: Family Medicine

## 2017-01-21 ENCOUNTER — Ambulatory Visit (INDEPENDENT_AMBULATORY_CARE_PROVIDER_SITE_OTHER): Payer: 59 | Admitting: Family Medicine

## 2017-01-21 VITALS — BP 147/92 | HR 87 | Ht 67.0 in | Wt 186.6 lb

## 2017-01-21 DIAGNOSIS — F5101 Primary insomnia: Secondary | ICD-10-CM

## 2017-01-21 DIAGNOSIS — F4323 Adjustment disorder with mixed anxiety and depressed mood: Secondary | ICD-10-CM

## 2017-01-21 NOTE — Patient Instructions (Addendum)
Contact [employer] dept at work.   Please go for counseling to help you get through this emotional stress in her life. - When you come in in 4 months we can get fasting lab work, so come fasting and also we can give do your hepatitis C and HIV screening at that time.  Please come in for nurse only visit to get her flu shot starting October 1 at your convenience    If you have insomnia or difficulty sleeping, this information is for you:  - Avoid caffeinated beverages after lunch,  no alcoholic beverages,  no eating within 2-3 hours of lying down,  avoid exposure to blue light before bed,  avoid daytime naps, and  needs to maintain a regular sleep schedule- go to sleep and wake up around the same time every night.   - Resolve concerns or worries before entering bedroom:  Discussed relaxation techniques with patient and to keep a journal to write down fears\ worries.  I suggested seeing a counselor for CBT.   - Recommend patient meditate or do deep breathing exercises to help relax.   Incorporate the use of white noise machines or listen to "sleep meditation music", or recordings of guided meditations for sleep from YouTube which are free, such as  "guided meditation for detachment from over thinking"  by Mayford Knife.        What is Chronic Stress Syndrome, Symptoms & Ways to Deal With it   What is Chronic Stress Syndrome?  Chronic Stress Syndrome is something which can now be called as a medical condition due to the amount of stress an individual is going through these days. Chronic Stress Syndrome causes the body and mind to shutdown and the person has no control over himself or herself. Due to the demands of modern day life and the hardship throughout day and night takes its toll over a period of time and the body and brain starts demanding rest and a break. This leads to certain symptoms where your performance level starts to dip at work, you become irritable both at work and at home, you may  stop enjoying activities you previously liked, you may become depressed, you may get angry for even small things. Chronic Stress Syndrome can significantly impact your quality life. Thus it is important understand the symptoms of Chronic Stress Syndrome and react accordingly in order to cope up with it.  It is important to note here that a balanced work-home equation should be drawn to cut down symptoms of Chronic Stress Syndrome. Minor stressors can be overcome by the body's inbuilt stress response but when there is unending stress for a long period of time then an external help is required to ease the stress.  Chronic Stress Syndrome can physically and psychologically drain you over a period of time. For such cases stress management is the best way to cope up with Chronic Stress Syndrome. If Chronic Stress Syndrome is not treated then it may result in many health hazards like anxiety, muscle pain, insomnia, and high blood pressure along with a compromised immune system leading to frequent infections and missed days from work.    What are the Symptoms of Chronic Stress Syndrome?   The symptoms of Chronic Stress Syndrome are variable and range from generalized symptoms to emotional symptoms along with behavioral and cognitive symptoms. Some of these symptoms have been delineated below:  Generalized Symptoms of Chronic Stress Syndrome are: Anxiety Depression Social isolation Headache Abdominal pain Lack of sleep Back  pain Difficulty in concentrating Hypertension Hemorrhoids Varicose veins Panic attacks/ Panic disorder Cardiovascular diseases.   Some of the Emotional Symptoms of Chronic Stress Syndrome are: To become easily agitated, moody and frustrated Feeling overwhelmed which makes you feel like you are losing control. Having difficulty relaxing and have a peaceful mind Having low self esteem Feeling lonely Feeling worthless Feeling depressed Avoiding social  environment.   Some of the Physical Symptoms of Chronic Stress Syndrome are: Headaches Lethargy Alternating diarrhea and constipation Nausea Muscles aches and pains Insomnia Rapid heartbeat and chest pain Infections and frequent colds Decreased libido Nervousness and shaking Tinnitus Sweaty palms Dry mouth Clenched jaw.  Some of the Cognitive Symptoms of Chronic Stress Syndrome are: Constant worrying Racing thoughts Disorganization and forgetfulness Inability to focus Poor judgment Abundance of negativity.  Some of the Behavioral Symptoms of Chronic Stress Syndrome are: Changes in appetite with less desire to eat Avoiding responsibilities Indulgence in alcohol or recreational drug use Increased nail biting and being fidgety Ways to Deal With Chronic Stress Syndrome    Chronic Stress Syndrome is not something which cannot be addressed. A bit of effort from your side in the form of lifestyle modifications, a little bit of exercise, a balanced work life equation can do wonders and help you get rid of Chronic Stress Syndrome.  Get Proper Sleep: It has been proved that Chronic Stress Syndrome causes loss of sleep where an individual may not even be able to sleep for days unending. This may result in the individual feeling lethargic and unable to focus at work the following morning. This may lead to decreased performance at work. Thus, it is important to have a good sleep-wake cycle. For this, try and not drink any caffeinated beverage about four hours prior to going to sleep, as caffeine pumps up the adrenaline and causes you to stay awake resulting ultimately in Chronic Stress Syndrome.  Avoid Alcohol and Drugs: Another way to get rid of Chronic Stress Syndrome is lifestyle modifications. Stay away from alcohol and other recreational drugs. Take Short Frequent Breaks at Work: Try to take frequent breaks from work and do not work continuously. Try and manage your work in such a  way that you even meet your deadline and come home on time for a happy dinner with family. A good time spent with family and kids does wonders in not only dealing with Chronic Stress Syndrome but also preventing it.  Become Physically Active: Another step towards getting rid of Chronic Stress Syndrome is physical activity. If you do not have time to spend at the gym then at least try and go for daily walks for about half an hour a day which not only keeps the stress away but also is good for your overall health. Physical activity leads to production of endorphins which will make you feel relaxed and feel good.  Healthy Diet Can Help You Deal With Chronic Stress Syndrome: Have a balanced and healthy diet is another step towards a stress free life and keeping Chronic Stress Syndrome at [place]. If time is a constraint then you can try eating three small meals a day. Try and avoid fast foods and take foods which are healthy and rich in proteins, fiber, and carbohydrates to boost your energy system.  Music Can Soothe Your Mind: Light music is one of the best and most effective relaxation techniques that one can try to overcome stress. It has shown to calm down the mind and take you away from all  the stressors that you may be having. These days it is also being used as a therapy in some institutes for overcoming stress. It is important here to discuss the importance of a good social support system for patients with Chronic Stress Syndrome, as a good social support framework can do wonders in taking the stress away from the patient and overcoming Chronic Stress Syndrome.  Meditation Can Help You Deal With Chronic Stress Syndrome Effectively: Meditation and yoga has also shown to be quite effective in relaxing the mind and coping up with Chronic Stress Syndrome   In cases where these measures are not helpful, then it is time for you to consult with a skilled psychologist or a psychiatrist for potential therapies or  medications to control the stress response.   The psychologist can help you with a variety of steps for coping up with Chronic Stress Syndrome. Relaxation techniques and behavioral therapy are some of the methods employed by psychologists. In some cases, medications can also be given to help relax the patient.  Since Chronic Stress Syndrome is both emotionally and physically draining for the patient and it also adversely affects the family life of the patient hence it is important for the patient to recognize the condition and taking steps to cope up with it. Escaping measures like alcohol and drug use are of no help as they only aggravate the condition apart from their other health hazards. If this condition is ignored or left untreated it can lead to various medical conditions like anxiety and depression and various other medical conditions.  Last but not least, smile as often as you can as it is the best gift that you can give to someone. The best way to stay relaxed is to have a good smile, exercise daily, spend time with your family, meditation and if required consultation with a good psychologist so that you can live a stress free life and overcome the symptoms of Chronic Stress Syndrome.

## 2017-01-21 NOTE — Progress Notes (Signed)
Impression and Recommendations:    1. Adjustment disorder with mixed anxiety and depressed mood   2. Primary insomnia     Adjustment disorder with mixed anxiety and depressed mood  Primary insomnia  The patient was counseled, risk factors were discussed, anticipatory guidance given.  Modified Medications   Modified Medication Previous Medication   ALPRAZOLAM (XANAX) 1 MG TABLET ALPRAZolam (XANAX) 1 MG tablet      Take 1 tablet (1 mg total) by mouth 3 (three) times daily as needed for anxiety.    Take 1 mg by mouth 3 (three) times daily as needed for anxiety.    ESTRADIOL (ESTRACE) 0.5 MG TABLET estradiol (ESTRACE) 0.5 MG tablet      TAKE 1 TABLET BY MOUTH  DAILY    Take 1 tablet (0.5 mg total) by mouth daily.   PROGESTERONE (PROMETRIUM) 200 MG CAPSULE progesterone (PROMETRIUM) 200 MG capsule      TAKE 1 CAPSULE BY MOUTH  DAILY    Take 1 capsule (200 mg total) by mouth daily.   QUETIAPINE (SEROQUEL) 50 MG TABLET QUEtiapine (SEROQUEL) 50 MG tablet      Take 1 tablet (50 mg total) by mouth at bedtime.    Take 1 tablet (50 mg total) by mouth at bedtime.   TRIAMTERENE-HYDROCHLOROTHIAZIDE (MAXZIDE-25) 37.5-25 MG TABLET triamterene-hydrochlorothiazide (MAXZIDE-25) 37.5-25 MG per tablet      Take 1 tablet by mouth daily.    Take 1 tablet by mouth daily.    Gross side effects, risk and benefits, and alternatives of medications and treatment plan in general discussed with patient.  Patient is aware that all medications have potential side effects and we are unable to predict every side effect or drug-drug interaction that may occur.   Patient will call with any questions prior to using medication if they have concerns.  Expresses verbal understanding and consents to current therapy and treatment regimen.  No barriers to understanding were identified.  Red flag symptoms and signs discussed in detail.  Patient expressed understanding regarding what to do in case of emergency\urgent  symptoms  Please see AVS handed out to patient at the end of our visit for further patient instructions/ counseling done pertaining to today's office visit.   Return in about 4 months (around 05/23/2017) for chronic .     Note: This document was prepared using Dragon voice recognition software and may include unintentional dictation errors.  Mellody Dance 10:39 AM --------------------------------------------------------------------------------------------------------------------------------------------------------------------------------------------------------------------------------------------    Subjective:    CC:  Chief Complaint  Patient presents with  . Follow-up    HPI: Mercedes Webb is a 61 y.o. female who presents to [place] [facility] today for follow-up of mood.   Anxiety:  Mercedes Webb was last seen 11/26/2016 and we increased her mood medicines at that time.  She went from 20 mg of fluoxetine daily to 40 mg daily.  COULD NOT TOLERATE higher dose b/c of sweating terribly.     Insomnia:  GYN provider put pt on seroquel 3 days or so ago, to help with her mood and help her sleep at night.  She will continue to get this medicine from GYN  - Just told last wk that she lost job- told she will have a 20 wk severance pay  Patient also describes a chronic trigger point injections that she's had every 6 months from her previous PCP in her upper traps.  Also she states that she's had trigger point injections for  lateral epicondylitis in her elbows approximately every 6 months as well.  The last injections were in December 2017.   Depression screen [facility] 2/9 05/28/2017 01/21/2017 11/26/2016  Decreased Interest 0 2 0  Down, Depressed, Hopeless 0 2 0  PHQ - 2 Score 0 4 0  Altered sleeping 1 2 1   Tired, decreased energy 1 3 1   Change in appetite 2 3 1   Feeling bad or failure about yourself  1 2 0  Trouble concentrating 0 3 0  Moving slowly or fidgety/restless 0  2 0  Suicidal thoughts 0 0 0  PHQ-9 Score 5 19 3   Difficult doing work/chores Somewhat difficult Somewhat difficult -     GAD 7 : Generalized Anxiety Score 05/28/2017 01/21/2017 11/26/2016  Nervous, Anxious, on Edge 1 3 1   Control/stop worrying 1 3 1   Worry too much - different things 1 3 1   Trouble relaxing 1 3 1   Restless 1 2 1   Easily annoyed or irritable 1 1 1   Afraid - awful might happen 1 0 0  Total GAD 7 Score 7 15 6   Anxiety Difficulty Somewhat difficult Somewhat difficult Somewhat difficult     Wt Readings from Last 3 Encounters:  05/28/17 197 lb (89.4 kg)  01/21/17 186 lb 9.6 oz (84.6 kg)  01/17/17 185 lb 1.6 oz (84 kg)   BP Readings from Last 3 Encounters:  05/28/17 121/79  02/15/17 117/74  01/21/17 (!) 147/92   Pulse Readings from Last 3 Encounters:  05/28/17 92  02/15/17 89  01/21/17 87   BMI Readings from Last 3 Encounters:  05/28/17 30.85 kg/m  01/21/17 29.23 kg/m  01/17/17 28.99 kg/m     Patient Care Team    Relationship Specialty Notifications Start End  Mellody Dance, DO PCP - General Family Medicine  11/26/16   Defrancesco, Alanda Slim, MD Consulting Physician Obstetrics and Gynecology  11/12/16   Joylene Igo, CNM Midwife Obstetrics and Gynecology  11/12/16   Danella Sensing, MD Consulting Physician Dermatology  11/12/16   Richmond Campbell, MD Consulting Physician Gastroenterology  11/26/16   Carol Ada, MD Consulting Physician Gastroenterology  11/26/16   Keene Breath., MD  Ophthalmology  11/26/16   Joylene Igo, CNM Midwife Obstetrics and Gynecology  11/26/16    Comment: Prescribes her Seroquel     Patient Active Problem List   Diagnosis Date Noted  . Primary insomnia 11/12/2016    Priority: High  . Adjustment disorder with mixed anxiety and depressed mood 11/12/2016    Priority: High  . Elevated cholesterol with elevated triglycerides 09/28/2016    Priority: High  . Essential hypertension 01/31/2016    Priority: High  .  Generalized anxiety disorder 01/31/2016    Priority: High  . Postmenopausal- 30 yrs ago TAH "yrs ago" 11/12/2016    Priority: Medium  . Hormone imbalance- txed by gyn 11/12/2016    Priority: Medium  . Mixed hyperlipidemia 11/12/2016    Priority: Medium  . Hypokalemia 02/01/2016    Priority: Medium  . Vitamin D deficiency 09/28/2016    Priority: Low  . Acute biliary pancreatitis 01/31/2016  . Overweight 12/23/2014  . Surgical menopause 12/23/2014  . Status post abdominal hysterectomy 12/23/2014  . History of cervical dysplasia 12/23/2014  . SUI (stress urinary incontinence, female) 12/23/2014    Past Medical history, Surgical history, Family history, Social history, Allergies and Medications have been entered into the medical record, reviewed and changed as needed.    Current Meds  Medication Sig  .  Ascorbic Acid (VITAMIN C) 1000 MG tablet Take 1,000 mg by mouth daily.  Marland Kitchen aspirin EC 81 MG tablet Take 1 tablet (81 mg total) by mouth daily.  Marland Kitchen atorvastatin (LIPITOR) 10 MG tablet Take 1 tablet (10 mg total) by mouth daily.  . Calcium Carbonate (CALTRATE 600 PO) Take 1 tablet by mouth daily.  . cyanocobalamin (,VITAMIN B-12,) 1000 MCG/ML injection Inject 1 mL (1,000 mcg total) into the muscle every 30 (thirty) days.  Marland Kitchen FLUoxetine (PROZAC) 20 MG tablet Take 1 tablet (20 mg total) by mouth 2 (two) times daily. Caps are Okay Substitution (Patient taking differently: Take 20 mg by mouth 2 (two) times daily. Caps are Okay Substitution.  Patient takes 1 capsule daily)  . ibuprofen (ADVIL,MOTRIN) 800 MG tablet Take 800 mg by mouth every 8 (eight) hours as needed for moderate pain.   . meclizine (ANTIVERT) 12.5 MG tablet Take 12.5 mg by mouth 3 (three) times daily as needed for dizziness or nausea.  . Melatonin (MELATONIN MAXIMUM STRENGTH) 5 MG TABS Take 5 mg by mouth at bedtime as needed (sleep).   . Multiple Vitamins-Minerals (CENTRUM SILVER PO) Take 1 tablet by mouth daily.  . Omega-3  Fatty Acids (FISH OIL) 1000 MG CAPS Take 1 capsule by mouth daily.  . potassium chloride (K-DUR) 10 MEQ tablet Take 1 tablet (10 mEq total) by mouth daily.  . valACYclovir (VALTREX) 500 MG tablet   . Vitamin D, Ergocalciferol, (DRISDOL) 50000 units CAPS capsule Take 1 capsule (50,000 Units total) by mouth every 7 (seven) days.  . [DISCONTINUED] ALPRAZolam (XANAX) 1 MG tablet Take 1 mg by mouth 3 (three) times daily as needed for anxiety.   . [DISCONTINUED] estradiol (ESTRACE) 0.5 MG tablet Take 1 tablet (0.5 mg total) by mouth daily.  . [DISCONTINUED] progesterone (PROMETRIUM) 200 MG capsule Take 1 capsule (200 mg total) by mouth daily.  . [DISCONTINUED] QUEtiapine (SEROQUEL) 50 MG tablet Take 1 tablet (50 mg total) by mouth at bedtime.  . [DISCONTINUED] triamterene-hydrochlorothiazide (MAXZIDE-25) 37.5-25 MG per tablet Take 1 tablet by mouth daily.     Allergies:  No Known Allergies   Review of Systems: Review of Systems: General:   No F/C, wt loss Pulm:   No DIB, SOB, pleuritic chest pain Card:  No CP, palpitations Abd:  No n/v/d or pain Ext:  No inc edema from baseline Psych: no SI/ HI    Objective:   Blood pressure (!) 147/92, pulse 87, height 5\' 7"  (1.702 m), weight 186 lb 9.6 oz (84.6 kg). Body mass index is 29.23 kg/m. General:  Well Developed, well nourished, appropriate for stated age.  Neuro:  Alert and oriented,  extra-ocular muscles intact  HEENT:  Normocephalic, atraumatic, neck supple, no carotid bruits appreciated  Skin:  no gross rash, warm, pink. Cardiac:  RRR, S1 S2 Respiratory:  ECTA B/L and A/P, Not using accessory muscles, speaking in full sentences- unlabored. Vascular:  Ext warm, no cyanosis apprec.; cap RF less 2 sec. Psych:  No HI/SI, judgement and insight good, Euthymic mood. Full Affect.

## 2017-01-24 ENCOUNTER — Telehealth: Payer: Self-pay | Admitting: Obstetrics and Gynecology

## 2017-01-24 ENCOUNTER — Other Ambulatory Visit: Payer: Self-pay | Admitting: *Deleted

## 2017-01-24 MED ORDER — QUETIAPINE FUMARATE 50 MG PO TABS: 50.0000 mg | ORAL_TABLET | Freq: Every day | ORAL | 4 refills | Status: DC

## 2017-01-24 NOTE — Telephone Encounter (Signed)
Pt stated that she hasn't received her medication from her mail order pharmacy [facility]. Pt stated that at her OV on 01/17/17 her medications were going to be refilled to [facility] but she only received the Asprin. Pt request that we resend the Rx for the following medications:  1. atorvastatin (LIPITOR) 10 MG tablet 2. potassium chloride (K-DUR) 10 MEQ tablet 3. QUEtiapine (SEROQUEL) 50 MG tablet 4. Vitamin D, Ergocalciferol, (DRISDOL) 50000 units CAPS capsule  Please advise. Thanks TNP

## 2017-01-24 NOTE — Telephone Encounter (Signed)
Done-ac 

## 2017-01-28 NOTE — Telephone Encounter (Signed)
Patient still hasn't received her medication   Please call

## 2017-02-14 ENCOUNTER — Other Ambulatory Visit: Payer: Self-pay

## 2017-02-14 DIAGNOSIS — Z23 Encounter for immunization: Secondary | ICD-10-CM

## 2017-02-15 ENCOUNTER — Ambulatory Visit (INDEPENDENT_AMBULATORY_CARE_PROVIDER_SITE_OTHER): Payer: 59

## 2017-02-15 DIAGNOSIS — Z23 Encounter for immunization: Secondary | ICD-10-CM | POA: Diagnosis not present

## 2017-02-15 NOTE — Progress Notes (Signed)
Pt here for influenza vaccine.  Screening questionnaire reviewed, VIS provided to patient, and any/all patient questions answered.  T. Vianne Grieshop, CMA  

## 2017-03-20 ENCOUNTER — Other Ambulatory Visit: Payer: Self-pay

## 2017-03-20 DIAGNOSIS — I10 Essential (primary) hypertension: Secondary | ICD-10-CM

## 2017-03-20 DIAGNOSIS — F411 Generalized anxiety disorder: Secondary | ICD-10-CM

## 2017-03-20 MED ORDER — ALPRAZOLAM 1 MG PO TABS: 1.0000 mg | ORAL_TABLET | Freq: Three times a day (TID) | ORAL | 0 refills | Status: DC | PRN

## 2017-03-20 MED ORDER — TRIAMTERENE-HCTZ 37.5-25 MG PO TABS: 1.0000 | ORAL_TABLET | Freq: Every day | ORAL | 1 refills | Status: DC

## 2017-03-20 NOTE — Telephone Encounter (Signed)
Pharmacy sent refill request for Alprazolam and Triamt/HCTZ.  Medications were last filled by a previous provider, request sent to Dr. Raliegh Scarlet for review. MPulliam, CMA/RT(R)

## 2017-03-28 ENCOUNTER — Other Ambulatory Visit: Payer: Self-pay | Admitting: Obstetrics and Gynecology

## 2017-05-01 ENCOUNTER — Other Ambulatory Visit: Payer: Self-pay | Admitting: Obstetrics and Gynecology

## 2017-05-28 ENCOUNTER — Encounter: Payer: Self-pay | Admitting: Family Medicine

## 2017-05-28 ENCOUNTER — Ambulatory Visit (INDEPENDENT_AMBULATORY_CARE_PROVIDER_SITE_OTHER): Payer: 59 | Admitting: Family Medicine

## 2017-05-28 VITALS — BP 121/79 | HR 92 | Ht 67.0 in | Wt 197.0 lb

## 2017-05-28 DIAGNOSIS — I10 Essential (primary) hypertension: Secondary | ICD-10-CM | POA: Diagnosis not present

## 2017-05-28 DIAGNOSIS — F4323 Adjustment disorder with mixed anxiety and depressed mood: Secondary | ICD-10-CM

## 2017-05-28 DIAGNOSIS — F5101 Primary insomnia: Secondary | ICD-10-CM | POA: Diagnosis not present

## 2017-05-28 DIAGNOSIS — F411 Generalized anxiety disorder: Secondary | ICD-10-CM

## 2017-05-28 DIAGNOSIS — E669 Obesity, unspecified: Secondary | ICD-10-CM | POA: Diagnosis not present

## 2017-05-28 DIAGNOSIS — E66811 Obesity, class 1: Secondary | ICD-10-CM

## 2017-05-28 MED ORDER — FLUOXETINE HCL 20 MG PO TABS: 20.0000 mg | ORAL_TABLET | Freq: Every day | ORAL | 0 refills | Status: DC

## 2017-05-28 MED ORDER — TRIAMTERENE-HCTZ 37.5-25 MG PO TABS: 1.0000 | ORAL_TABLET | Freq: Every day | ORAL | 1 refills | Status: DC

## 2017-05-28 MED ORDER — FLUOXETINE HCL 20 MG PO TABS
20.0000 mg | ORAL_TABLET | Freq: Every day | ORAL | 1 refills | Status: DC
Start: 2017-05-28 — End: 2017-09-04

## 2017-05-28 NOTE — Patient Instructions (Signed)
In 3 months or so come in for fasting blood work then office visit with me   -Also we need to transition your brain into thinking more positively.  These tasks below are some things I want you to do every day 1)  write 3 new things that you are grateful for every day for 21 days  2)  exercise daily- walk for 15 minutes twice a day every day 3)  you are going to journal every day about one positive experience that you had 4)  meditate every day.  You can go on YouTube and look for 15-minute relaxation meditation or what ever.  But we need to make sure that you are in the moment and relaxing and deep breathing every day 5)  Write 1 positive email every day to praise someone in your life     - If you have insomnia or difficulty sleeping, this information is for you:  - Avoid caffeinated beverages after lunch,  no alcoholic beverages,  no eating within 2-3 hours of lying down,  avoid exposure to blue light before bed,  avoid daytime naps, and  needs to maintain a regular sleep schedule- go to sleep and wake up around the same time every night.   - Resolve concerns or worries before entering bedroom:  Discussed relaxation techniques with patient and to keep a journal to write down fears\ worries.  I suggested seeing a counselor for CBT.   - Recommend patient meditate or do deep breathing exercises to help relax.   Incorporate the use of white noise machines or listen to "sleep meditation music", or recordings of guided meditations for sleep from YouTube which are free, such as  "guided meditation for detachment from over thinking"  by Mayford Knife.
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
## 2017-05-29 ENCOUNTER — Encounter: Payer: Self-pay | Admitting: Family Medicine

## 2017-06-10 DIAGNOSIS — E669 Obesity, unspecified: Secondary | ICD-10-CM | POA: Insufficient documentation

## 2017-06-28 ENCOUNTER — Ambulatory Visit (INDEPENDENT_AMBULATORY_CARE_PROVIDER_SITE_OTHER): Payer: 59 | Admitting: Family Medicine

## 2017-06-28 ENCOUNTER — Encounter: Payer: Self-pay | Admitting: Family Medicine

## 2017-06-28 VITALS — BP 126/82 | HR 79 | Ht 67.0 in | Wt 199.0 lb

## 2017-06-28 DIAGNOSIS — F411 Generalized anxiety disorder: Secondary | ICD-10-CM | POA: Diagnosis not present

## 2017-06-28 DIAGNOSIS — M62838 Other muscle spasm: Secondary | ICD-10-CM | POA: Diagnosis not present

## 2017-06-28 DIAGNOSIS — M791 Myalgia, unspecified site: Secondary | ICD-10-CM | POA: Diagnosis not present

## 2017-06-28 NOTE — Progress Notes (Signed)
Subjective:    HPI: Mercedes Webb is a 62 y.o. female who presents to [place] at [facility] today for trigger point injections of her upper back muscles.   She also is here for follow-up of her generalized anxiety and has questions about her Xanax prescription.  1. Patient states she was only given #30 Xanax as a 90-day supply.  She states that the pharmacy told her this was 10 days supply only.  Patient is confused and wondering why she was not giving a full prescription.  We had a discussion last office visit about her only taking as needed and not regularly.  This is why we also have her on the Prozac daily to keep her symptoms at [place] and only use the Xanax when she feels it only absolutely necessary.  We had discussion about the detrimental side effects of chronic use of that medicine last office visit and she understood.   In the past her PCPs were given her 90 tablets at a time and patient was using it more regularly.  She was not on an SSRI at that time.   -Symptoms have been well controlled.  She feels that the current dose of Prozac is good as she did not tolerate when we went up on it made her sweat way too much.  Rarely takes Xanax  2.  Pt c/o pain in the upper back bilaterally.    Pt's pain has been pretty bad over the past few weeks and really affecting patient's quality of life.  She has been going to her massage therapist regularly which normally helps her pain tremendously but it is really exacerbated in the past only trigger point injections could break the pain cycle.  She is here for that treatment today.  Pt has had previous good relief with trigger point injections, desires them again  Patient notes that her last trigger point was obtained in December 2018 of last year, with Ovid Curd.  The patient's pain does not radiate down into her arms. Her pain has been flaring for a couple of months, but she's been visiting her massage therapist regularly, which  is helping some but not enough. She takes ibuprofen to manage her pain.  Notes that she has never had benefits with flexeril/ muscle relaxers.  Pain Inventory Average Pain level: 7-9  Pain Right Now: same My pain is constant, sharp and stabbing  In the last 24 hours, has your pain interfered with the following? General activity:  yes Relations with others:  y Enjoyment of life:  y What TIME of day is your pain at its worst?  any Sleep (in general):  Poor due to pain  Pain is worse with: activity/ mvmnt Pain improves with:  Massage, heat, injections Relief from Meds:  little  Mobility Lives independently Walks w/o assistance Able to climb steps?  yes Drives self?  yes Retired/ works: retired  Prior Studies Any changes since last visit?  no  Physicians involved in your care Any changes since last visit?  no  Patient Care Team    Relationship Specialty Notifications Start End  Mellody Dance, DO PCP - General Family Medicine  11/26/16   Defrancesco, Alanda Slim, MD Consulting Physician Obstetrics and Gynecology  11/12/16   Joylene Igo, CNM Midwife Obstetrics and Gynecology  11/12/16   Danella Sensing, MD Consulting Physician Dermatology  11/12/16   Richmond Campbell, MD Consulting Physician Gastroenterology  11/26/16   Carol Ada, MD Consulting Physician  Gastroenterology  11/26/16   Keene Breath., MD  Ophthalmology  11/26/16   Joylene Igo, CNM Midwife Obstetrics and Gynecology  11/26/16    Comment: Prescribes her Seroquel    Depression screen [facility] 2/9 06/28/2017 05/28/2017 01/21/2017 11/26/2016  Decreased Interest 1 0 2 0  Down, Depressed, Hopeless 1 0 2 0  PHQ - 2 Score 2 0 4 0  Altered sleeping 1 1 2 1   Tired, decreased energy 1 1 3 1   Change in appetite 1 2 3 1   Feeling bad or failure about yourself  1 1 2  0  Trouble concentrating 0 0 3 0  Moving slowly or fidgety/restless 0 0 2 0  Suicidal thoughts 0 0 0 0  PHQ-9 Score 6 5 19 3   Difficult doing  work/chores Not difficult at all Somewhat difficult Somewhat difficult -    Fall Risk  11/26/2016  Falls in the past year? No     Wt Readings from Last 3 Encounters:  06/28/17 199 lb (90.3 kg)  05/28/17 197 lb (89.4 kg)  01/21/17 186 lb 9.6 oz (84.6 kg)   BP Readings from Last 3 Encounters:  06/28/17 126/82  05/28/17 121/79  02/15/17 117/74   Pulse Readings from Last 3 Encounters:  06/28/17 79  05/28/17 92  02/15/17 89   BMI Readings from Last 3 Encounters:  06/28/17 31.17 kg/m  05/28/17 30.85 kg/m  01/21/17 29.23 kg/m     Patient Active Problem List   Diagnosis Date Noted  . Obesity, Class I, BMI 30-34.9 06/10/2017    Priority: High  . Primary insomnia 11/12/2016    Priority: High  . Adjustment disorder with mixed anxiety and depressed mood 11/12/2016    Priority: High  . Elevated cholesterol with elevated triglycerides 09/28/2016    Priority: High  . Essential hypertension 01/31/2016    Priority: High  . Generalized anxiety disorder 01/31/2016    Priority: High  . Postmenopausal- 30 yrs ago TAH "yrs ago" 11/12/2016    Priority: Medium  . Hormone imbalance- txed by gyn 11/12/2016    Priority: Medium  . Mixed hyperlipidemia 11/12/2016    Priority: Medium  . Hypokalemia 02/01/2016    Priority: Medium  . Vitamin D deficiency 09/28/2016    Priority: Low  . Status post abdominal hysterectomy 12/23/2014    Priority: Low  . Acute biliary pancreatitis 01/31/2016  . Overweight 12/23/2014  . Surgical menopause 12/23/2014  . History of cervical dysplasia 12/23/2014  . SUI (stress urinary incontinence, female) 12/23/2014    Past Surgical History:  Procedure Laterality Date  . ABDOMINAL HYSTERECTOMY     tah/bso- endometriosis  . CHOLECYSTECTOMY    . ERCP N/A 02/03/2016   Procedure: ENDOSCOPIC RETROGRADE CHOLANGIOPANCREATOGRAPHY (ERCP);  Surgeon: Carol Ada, MD;  Location: [facility] ENDOSCOPY;  Service: Endoscopy;  Laterality: N/A;  . KNEE ARTHROSCOPY    .  tonsillectomy    . TONSILLECTOMY    . TUBAL LIGATION      Family History  Problem Relation Age of Onset  . Diabetes Father   . Heart disease Father   . Breast cancer Sister 78  . Diabetes Brother   . Colon cancer Neg Hx   . Ovarian cancer Neg Hx     Social History   Substance and Sexual Activity  Drug Use No  ,  Social History   Substance and Sexual Activity  Alcohol Use Yes   Comment: occas  ,  Social History   Tobacco Use  Smoking Status Former Smoker  .  Last attempt to quit: 04/30/1988  . Years since quitting: 29.1  Smokeless Tobacco Never Used  ,  Social History   Substance and Sexual Activity  Sexual Activity Yes  . Birth control/protection: Surgical    Patient's Medications  New Prescriptions   No medications on file  Previous Medications   ALPRAZOLAM (XANAX) 1 MG TABLET    Take 1 tablet (1 mg total) by mouth 3 (three) times daily as needed for anxiety.   ASCORBIC ACID (VITAMIN C) 1000 MG TABLET    Take 1,000 mg by mouth daily.   ASPIRIN EC 81 MG TABLET    Take 1 tablet (81 mg total) by mouth daily.   ATORVASTATIN (LIPITOR) 10 MG TABLET    Take 1 tablet (10 mg total) by mouth daily.   CALCIUM CARBONATE (CALTRATE 600 PO)    Take 1 tablet by mouth daily.   CYANOCOBALAMIN (,VITAMIN B-12,) 1000 MCG/ML INJECTION    Inject 1 mL (1,000 mcg total) into the muscle every 30 (thirty) days.   ESTRADIOL (ESTRACE) 0.5 MG TABLET    TAKE 1 TABLET BY MOUTH  DAILY   FLUOXETINE (PROZAC) 20 MG TABLET    Take 1 tablet (20 mg total) by mouth daily. Caps are Okay Substitution   IBUPROFEN (ADVIL,MOTRIN) 800 MG TABLET    Take 800 mg by mouth every 8 (eight) hours as needed for moderate pain.    MECLIZINE (ANTIVERT) 12.5 MG TABLET    Take 12.5 mg by mouth 3 (three) times daily as needed for dizziness or nausea.   MELATONIN (MELATONIN MAXIMUM STRENGTH) 5 MG TABS    Take 5 mg by mouth at bedtime as needed (sleep).    MULTIPLE VITAMINS-MINERALS (CENTRUM SILVER PO)    Take 1 tablet by  mouth daily.   OMEGA-3 FATTY ACIDS (FISH OIL) 1000 MG CAPS    Take 1 capsule by mouth daily.   POTASSIUM CHLORIDE (K-DUR) 10 MEQ TABLET    Take 1 tablet (10 mEq total) by mouth daily.   PROGESTERONE (PROMETRIUM) 200 MG CAPSULE    TAKE 1 CAPSULE BY MOUTH  DAILY   QUETIAPINE (SEROQUEL) 50 MG TABLET    Take 1 tablet (50 mg total) by mouth at bedtime.   TRIAMTERENE-HYDROCHLOROTHIAZIDE (MAXZIDE-25) 37.5-25 MG TABLET    Take 1 tablet by mouth daily.   VALACYCLOVIR (VALTREX) 500 MG TABLET       VITAMIN D, ERGOCALCIFEROL, (DRISDOL) 50000 UNITS CAPS CAPSULE    Take 1 capsule (50,000 Units total) by mouth every 7 (seven) days.  Modified Medications   No medications on file  Discontinued Medications   No medications on file    Patient has no known allergies.  Current Meds  Medication Sig  . ALPRAZolam (XANAX) 1 MG tablet Take 1 tablet (1 mg total) by mouth 3 (three) times daily as needed for anxiety.  . Ascorbic Acid (VITAMIN C) 1000 MG tablet Take 1,000 mg by mouth daily.  Marland Kitchen aspirin EC 81 MG tablet Take 1 tablet (81 mg total) by mouth daily.  Marland Kitchen atorvastatin (LIPITOR) 10 MG tablet Take 1 tablet (10 mg total) by mouth daily.  . Calcium Carbonate (CALTRATE 600 PO) Take 1 tablet by mouth daily.  . cyanocobalamin (,VITAMIN B-12,) 1000 MCG/ML injection Inject 1 mL (1,000 mcg total) into the muscle every 30 (thirty) days.  Marland Kitchen estradiol (ESTRACE) 0.5 MG tablet TAKE 1 TABLET BY MOUTH  DAILY  . FLUoxetine (PROZAC) 20 MG tablet Take 1 tablet (20 mg total) by mouth daily. Caps  are Okay Substitution  . ibuprofen (ADVIL,MOTRIN) 800 MG tablet Take 800 mg by mouth every 8 (eight) hours as needed for moderate pain.   . meclizine (ANTIVERT) 12.5 MG tablet Take 12.5 mg by mouth 3 (three) times daily as needed for dizziness or nausea.  . Melatonin (MELATONIN MAXIMUM STRENGTH) 5 MG TABS Take 5 mg by mouth at bedtime as needed (sleep).   . Multiple Vitamins-Minerals (CENTRUM SILVER PO) Take 1 tablet by mouth daily.  .  Omega-3 Fatty Acids (FISH OIL) 1000 MG CAPS Take 1 capsule by mouth daily.  . potassium chloride (K-DUR) 10 MEQ tablet Take 1 tablet (10 mEq total) by mouth daily.  . progesterone (PROMETRIUM) 200 MG capsule TAKE 1 CAPSULE BY MOUTH  DAILY  . QUEtiapine (SEROQUEL) 50 MG tablet Take 1 tablet (50 mg total) by mouth at bedtime.  . triamterene-hydrochlorothiazide (MAXZIDE-25) 37.5-25 MG tablet Take 1 tablet by mouth daily.  . valACYclovir (VALTREX) 500 MG tablet   . Vitamin D, Ergocalciferol, (DRISDOL) 50000 units CAPS capsule Take 1 capsule (50,000 Units total) by mouth every 7 (seven) days.    Review of Systems: General:   No F/C, wt loss Pulm:   No DIB, SOB, pleuritic chest pain Card:  No CP, palpitations Abd:  No n/v/d or pain Ext:  No inc edema from baseline M-Sk:   As above   Objective:  Blood pressure 126/82, pulse 79, height 5\' 7"  (1.702 m), weight 199 lb (90.3 kg), SpO2 99 %. Body mass index is 31.17 kg/m.  General: Well Developed, well nourished, and in no acute distress.  HEENT: Normocephalic, atraumatic Skin: Warm and dry, cap RF less 2 sec, good turgor CV: +S1, S2 Respiratory: ECTA B/L; speaking in full sentences, no conversational dyspnea Abd: Soft, NT, ND, No G/R/R, no SPT, No flank pain NeuroM-Sk: Ambulates w/o assistance, moves * 4 Psych: A and O *3, normal mood and affect, no SI/HI   Trigger point injection procedure note:  Indication:   Upper trapezius bilateral myofascial pain not relieved by medication management and other conservative care.  Last injection lasted  ~ 13 mo.  Current Pain level:  7-8 /10.   Informed consent was obtained after describing risk, benefits and alternatives of the procedure with the patient, this includes, but is not limited to, bleeding, bruising, infection, medication side effects.    The patient was placed in a seated position.  Anatomy was examined. Trigger points were palpated, identified and marked.  Skin entered, using sterile  technique using topical analgesic, with a 25-gauge 1-1/2 inch needle.  Approximately 1.5-2 mL of solution injected into each of the sites * 4, after negative drawback for blood, using a 10cc syringe mixed with 4cc 1% lidocaine, 3cc 0.5 bupivacaine, and 1cc kenalog 40mg /cc.    Injected muscles:  R and L  splenius cervicis, levator scapulae, portion of rhomboid minor and infraspinatus muscles in addition to trapezius.  2 injections above the spine of the scapula on each side, 2 injections below spine of scapula on each side.   The patient tolerated the procedure well.  Hemodynamically stable.  Sterile bandage placed.  Post procedure instructions discussed.   Ice cup massage to muscles/ affected areas for 15-20 min every couple of hours for the next 48 hours, then as needed.   Pain level post-procedure: 4 -5/10.    Impression and Recommendations:    1. Muscle spasm   2. Myalgia   3. Trapezius muscle spasm   4. Generalized anxiety disorder  1. Trigger Point Injection - Patient has tolerated these procedures well in the past. - Discussed risks with the patient, especially that the pain may get worse before it gets better.  - Advised the patient that this injection will be lidocaine and Cortizone steroid mixed together. - Advised the patient that if she only gets these injections once a year, she should not experience any unpleasant side effects.  - Risks, benefits, and alternaives of the procedure reviewed in depth. - She knows that she may continue her massages, apply ice cup massages, and other methods to alleviate the pain. - Reviewed that this procedure could potentially cause bleeding, infection, or even exacerbate her pain. - Benefits include prolonged relief of the pain.  - When patient gets home, she should begin ice cup massages. - Emphasized that this will massage out the medicine and help it move around in her area of pain. - Reviewed that she may take a styrofoam cup, fill it  with water, freeze it, and have someone massage her shoulders with it.   2. GAD - Xanax Refill Difficulties - Patient recently had difficulty refilling her Xanax prescription. - Per patient, was confused about how many pills she should be receiving per refill. - Discussed with the patient that her refill amount is low because she should not be taking Xanax every day.  - Reviewed that she should only be taking 30 pills every 6 months to a year.  - Emphasized that overuse of Xanax can contribute to issues such as dementia among others. - Patient knows that she should only be using Xanax to manage spikes of anxiety with panic attack, not GAD. -Patient feels she needs to increase dose of her chronic daily medications, we will address this at a future date.  Patient states symptoms are stable and declines refill at this time. - Encouraged patient to continue doing meditation, deep breathing, and exercising more often to manage her anxiety.   Education and routine counseling performed. Handouts provided.  The patient was counseled, risk factors were discussed, anticipatory guidance given.  Gross side effects, risk and benefits, and alternatives of medications discussed with patient.  Patient is aware that all medications have potential side effects and we are unable to predict every side effect or drug-drug interaction that may occur.  Expresses verbal understanding and consents to current therapy plan and treatment regimen.  Return for Follow-up chronic care and as needed if the symptoms persist.   This document serves as a record of services personally performed by Mellody Dance, DO. It was created on her behalf by [name], a trained medical scribe. The creation of this record is based on the scribe's personal observations and the provider's statements to them.   I have reviewed the above medical documentation for accuracy and completeness and I concur.  Mellody Dance 06/28/17 1:30 PM

## 2017-08-20 ENCOUNTER — Other Ambulatory Visit: Payer: 59

## 2017-08-26 ENCOUNTER — Telehealth: Payer: Self-pay | Admitting: Family Medicine

## 2017-08-26 NOTE — Telephone Encounter (Signed)
Please contact [facility] at [phone] with Ref# [id number] regarding patient's Rx:  triamterene-hydrochlorothiazide ([id number]-82) 37.5-25 MG tablet [[id number]]   Order Details  Dose: 1 tablet Route: Oral Frequency: Daily  Dispense Quantity: 90 tablet Refills: 1 Fills remaining: --        Sig: Take 1 tablet by mouth daily.          Pharmacy needs provider to be made aware pt is taking another Rx (prescribedby another provider )-- [facility] request provider contact them.  --glh

## 2017-08-27 ENCOUNTER — Ambulatory Visit: Payer: 59 | Admitting: Family Medicine

## 2017-08-27 NOTE — Telephone Encounter (Signed)
Patient has appointment for labs this week and an office visit early next week.  Information faxed to [facility].

## 2017-08-29 ENCOUNTER — Other Ambulatory Visit: Payer: Self-pay

## 2017-08-29 ENCOUNTER — Other Ambulatory Visit: Payer: 59

## 2017-08-29 DIAGNOSIS — E782 Mixed hyperlipidemia: Secondary | ICD-10-CM

## 2017-08-29 DIAGNOSIS — I10 Essential (primary) hypertension: Secondary | ICD-10-CM

## 2017-08-29 DIAGNOSIS — E876 Hypokalemia: Secondary | ICD-10-CM

## 2017-08-29 DIAGNOSIS — Z0000 Encounter for general adult medical examination without abnormal findings: Secondary | ICD-10-CM

## 2017-08-29 DIAGNOSIS — E559 Vitamin D deficiency, unspecified: Secondary | ICD-10-CM

## 2017-08-30 LAB — VITAMIN D 25 HYDROXY (VIT D DEFICIENCY, FRACTURES): VIT D 25 HYDROXY: 60 ng/mL (ref 30.0–100.0)

## 2017-08-30 LAB — TSH: TSH: 3.97 u[IU]/mL (ref 0.450–4.500)

## 2017-08-30 LAB — COMPREHENSIVE METABOLIC PANEL
A/G RATIO: 1.6 (ref 1.2–2.2)
ALBUMIN: 4.2 g/dL (ref 3.6–4.8)
ALK PHOS: 125 IU/L — AB (ref 39–117)
ALT: 43 IU/L — ABNORMAL HIGH (ref 0–32)
AST: 30 IU/L (ref 0–40)
BILIRUBIN TOTAL: 0.3 mg/dL (ref 0.0–1.2)
BUN / CREAT RATIO: 19 (ref 12–28)
BUN: 22 mg/dL (ref 8–27)
CO2: 23 mmol/L (ref 20–29)
Calcium: 9.5 mg/dL (ref 8.7–10.3)
Chloride: 103 mmol/L (ref 96–106)
Creatinine, Ser: 1.17 mg/dL — ABNORMAL HIGH (ref 0.57–1.00)
GFR calc Af Amer: 57 mL/min/{1.73_m2} — ABNORMAL LOW (ref >59)
GFR calc non Af Amer: 50 mL/min/{1.73_m2} — ABNORMAL LOW (ref >59)
GLOBULIN, TOTAL: 2.7 g/dL (ref 1.5–4.5)
Glucose: 128 mg/dL — ABNORMAL HIGH (ref 65–99)
POTASSIUM: 4.3 mmol/L (ref 3.5–5.2)
SODIUM: 142 mmol/L (ref 134–144)
Total Protein: 6.9 g/dL (ref 6.0–8.5)

## 2017-08-30 LAB — LIPID PANEL
CHOLESTEROL TOTAL: 136 mg/dL (ref 100–199)
Chol/HDL Ratio: 3 ratio (ref 0.0–4.4)
HDL: 45 mg/dL (ref >39)
LDL CALC: 50 mg/dL (ref 0–99)
Triglycerides: 206 mg/dL — ABNORMAL HIGH (ref 0–149)
VLDL Cholesterol Cal: 41 mg/dL — ABNORMAL HIGH (ref 5–40)

## 2017-08-30 LAB — HEMOGLOBIN A1C
Est. average glucose Bld gHb Est-mCnc: 114 mg/dL
HEMOGLOBIN A1C: 5.6 % (ref 4.8–5.6)

## 2017-08-30 LAB — CBC WITH DIFFERENTIAL/PLATELET
Basophils Absolute: 0.1 10*3/uL (ref 0.0–0.2)
Basos: 1 %
EOS (ABSOLUTE): 0.2 10*3/uL (ref 0.0–0.4)
EOS: 3 %
HEMATOCRIT: 39 % (ref 34.0–46.6)
Hemoglobin: 12.9 g/dL (ref 11.1–15.9)
IMMATURE GRANULOCYTES: 0 %
Immature Grans (Abs): 0 10*3/uL (ref 0.0–0.1)
LYMPHS ABS: 1.6 10*3/uL (ref 0.7–3.1)
Lymphs: 29 %
MCH: 29.5 pg (ref 26.6–33.0)
MCHC: 33.1 g/dL (ref 31.5–35.7)
MCV: 89 fL (ref 79–97)
MONOS ABS: 0.4 10*3/uL (ref 0.1–0.9)
Monocytes: 8 %
NEUTROS PCT: 59 %
Neutrophils Absolute: 3.3 10*3/uL (ref 1.4–7.0)
PLATELETS: 285 10*3/uL (ref 150–379)
RBC: 4.38 x10E6/uL (ref 3.77–5.28)
RDW: 14.2 % (ref 12.3–15.4)
WBC: 5.6 10*3/uL (ref 3.4–10.8)

## 2017-08-30 LAB — T3: T3 TOTAL: 126 ng/dL (ref 71–180)

## 2017-08-30 LAB — T4, FREE: Free T4: 0.91 ng/dL (ref 0.82–1.77)

## 2017-09-04 ENCOUNTER — Encounter: Payer: Self-pay | Admitting: Family Medicine

## 2017-09-04 ENCOUNTER — Ambulatory Visit (INDEPENDENT_AMBULATORY_CARE_PROVIDER_SITE_OTHER): Payer: 59 | Admitting: Family Medicine

## 2017-09-04 VITALS — BP 143/83 | HR 87 | Ht 67.0 in | Wt 196.5 lb

## 2017-09-04 DIAGNOSIS — F411 Generalized anxiety disorder: Secondary | ICD-10-CM

## 2017-09-04 DIAGNOSIS — F4323 Adjustment disorder with mixed anxiety and depressed mood: Secondary | ICD-10-CM | POA: Diagnosis not present

## 2017-09-04 DIAGNOSIS — I10 Essential (primary) hypertension: Secondary | ICD-10-CM | POA: Diagnosis not present

## 2017-09-04 DIAGNOSIS — E669 Obesity, unspecified: Secondary | ICD-10-CM

## 2017-09-04 DIAGNOSIS — E782 Mixed hyperlipidemia: Secondary | ICD-10-CM | POA: Diagnosis not present

## 2017-09-04 DIAGNOSIS — R7401 Elevation of levels of liver transaminase levels: Secondary | ICD-10-CM

## 2017-09-04 DIAGNOSIS — R7989 Other specified abnormal findings of blood chemistry: Secondary | ICD-10-CM | POA: Diagnosis not present

## 2017-09-04 DIAGNOSIS — R740 Nonspecific elevation of levels of transaminase and lactic acid dehydrogenase [LDH]: Secondary | ICD-10-CM | POA: Diagnosis not present

## 2017-09-04 MED ORDER — ASPIRIN EC 81 MG PO TBEC: 81.0000 mg | DELAYED_RELEASE_TABLET | Freq: Every day | ORAL | 1 refills | Status: DC

## 2017-09-04 MED ORDER — ATORVASTATIN CALCIUM 10 MG PO TABS: 10.0000 mg | ORAL_TABLET | Freq: Every evening | ORAL | 1 refills | Status: DC

## 2017-09-04 MED ORDER — LOSARTAN POTASSIUM-HCTZ 100-12.5 MG PO TABS: 0.5000 | ORAL_TABLET | Freq: Every day | ORAL | 0 refills | Status: DC

## 2017-09-04 MED ORDER — ALPRAZOLAM 1 MG PO TABS: 1.0000 mg | ORAL_TABLET | Freq: Three times a day (TID) | ORAL | 0 refills | Status: DC | PRN

## 2017-09-04 NOTE — Progress Notes (Signed)
Impression and Recommendations:    1. Obesity, Class I, BMI 30-34.9   2. Essential hypertension   3. Elevated cholesterol with elevated triglycerides   4. Adjustment disorder with mixed anxiety and depressed mood   5. Elevated serum creatinine   6. Elevated ALT measurement   7. Generalized anxiety disorder     1. Obesity -Recommend losing weight. Get back to regular exercise. AHA dietary and exercise guidelines discussed.   2. HTN -start losartan-hctz. -d/c triamterene-hctz. -Check your BP at home and keep a log. Bring this into next OV.  3. Elevated cholesterol with elevated TG's -Continue meds QHS.  -prudent diet and exercise discussed. Reduce intake of saturated and trans fats, as well as fatty carbohydrates. -continue fish oil. -pt agrees to have her Women's health meds managed by her mid wife and I will manage her other meds (like statins, BP meds etc)  4. Adjustment disorder with mixed anxiety/GAD -Per pt, she stopped meds on her own as she wishes to wean off this. She was not able to tolerate prozac due to SE of excessive sweating.  -Will continue to monitor. -encouraged deep breathing exercises, meditation, yoga, etc.  -refill xanax today.    5. Elevated serum creatinine -Drink adequate amounts of water, equal to half of your weight in oz per day. Stop aleve, ibuprofen etc.  -recheck in 6 weeks.  6. Elevated ALT measurement -explained to pt this can be related to obesity, alcohol intake, medicines metabolized by the liver like tylenol etc.  -Avoid all OTC meds, recommend weight loss and exercise, will recheck in 6-8 weeks.   -continue OTC vit d supplements. Let our CMA know exactly what supplements you are taking.    -refill valcyclovir. She is on chronic suppression for HSV.   Meds ordered this encounter  Medications  . aspirin EC 81 MG tablet    Sig: Take 1 tablet (81 mg total) by mouth daily.    Dispense:  90 tablet    Refill:  1  .  atorvastatin (LIPITOR) 10 MG tablet    Sig: Take 1 tablet (10 mg total) by mouth Nightly.    Dispense:  90 tablet    Refill:  1  . losartan-hydrochlorothiazide (HYZAAR) 100-12.5 MG tablet    Sig: Take 0.5 tablets by mouth daily.    Dispense:  45 tablet    Refill:  0  . ALPRAZolam (XANAX) 1 MG tablet    Sig: Take 1 tablet (1 mg total) by mouth 3 (three) times daily as needed for anxiety.    Dispense:  30 tablet    Refill:  0    Gross side effects, risk and benefits, and alternatives of medications and treatment plan in general discussed with patient.  Patient is aware that all medications have potential side effects and we are unable to predict every side effect or drug-drug interaction that may occur.   Patient will call with any questions prior to using medication if they have concerns.  Expresses verbal understanding and consents to current therapy and treatment regimen.  No barriers to understanding were identified.  Red flag symptoms and signs discussed in detail.  Patient expressed understanding regarding what to do in case of emergency\urgent symptoms  Please see AVS handed out to patient at the end of our visit for further patient instructions/ counseling done pertaining to today's office visit.   Return for 6wks f/up me; 2-3-day prior for lab only visit.    Note: This note was  prepared with assistance of Systems analyst. Occasional wrong-word or sound-a-like substitutions may have occurred due to the inherent limitations of voice recognition software.  This document serves as a record of services personally performed by Mellody Dance, DO. It was created on her behalf by Mayer Masker, a trained medical scribe. The creation of this record is based on the scribe's personal observations and the provider's statements to them.   I have reviewed the above medical documentation for accuracy and completeness and I concur.  Mellody Dance 09/04/17 5:24  PM  --------------------------------------------------------------------------------------------------------------------------------------------------------------------------------------------------------------------------------------------    Subjective:     HPI: Mercedes Webb is a 62 y.o. female who presents to [place] at [facility] today for issues as discussed below.  Discussed extensively recent lab work that was done in office.   Mood She stopped prozac due to SE of excessive sweating. She stopped 2.5 weeks ago and wants to wean off it. She stopped this because she heard of SE of weight gain. She also stopped seraquel.   She reported her niece passed away recently but she is doing alright mentally.    Vit D She takes 50000 IUs once weekly of Vit D. She also takes caltrate + D, but the exact dose is unknown.   Supplements She has been taking fish oil.    Women's health She is going to continue with her obgyn. Her midwife started her on statins, phentermine short term, and also hormone replacement meds.    She takes 800 mg ibuprofen prescription due to pain in her shoulders. She reports her shoulder pain has improved after the injection she had done in office here.. She does not drink a lot of water.  Diet/exercise She has not been exercising as much as she should.   Wt Readings from Last 3 Encounters:  09/04/17 196 lb 8 oz (89.1 kg)  06/28/17 199 lb (90.3 kg)  05/28/17 197 lb (89.4 kg)   BP Readings from Last 3 Encounters:  09/04/17 (!) 143/83  06/28/17 126/82  05/28/17 121/79   Pulse Readings from Last 3 Encounters:  09/04/17 87  06/28/17 79  05/28/17 92   BMI Readings from Last 3 Encounters:  09/04/17 30.78 kg/m  06/28/17 31.17 kg/m  05/28/17 30.85 kg/m     Patient Care Team    Relationship Specialty Notifications Start End  Mellody Dance, DO PCP - General Family Medicine  11/26/16   Defrancesco, Alanda Slim, MD Consulting  Physician Obstetrics and Gynecology  11/12/16   Joylene Igo, CNM Midwife Obstetrics and Gynecology  11/12/16   Danella Sensing, MD Consulting Physician Dermatology  11/12/16   Richmond Campbell, MD Consulting Physician Gastroenterology  11/26/16   Carol Ada, MD Consulting Physician Gastroenterology  11/26/16   Keene Breath., MD  Ophthalmology  11/26/16   Joylene Igo, CNM Midwife Obstetrics and Gynecology  11/26/16    Comment: Prescribes her Seroquel     Patient Active Problem List   Diagnosis Date Noted  . Obesity, Class I, BMI 30-34.9 06/10/2017    Priority: High  . Primary insomnia 11/12/2016    Priority: High  . Adjustment disorder with mixed anxiety and depressed mood 11/12/2016    Priority: High  . Elevated cholesterol with elevated triglycerides 09/28/2016    Priority: High  . Essential hypertension 01/31/2016    Priority: High  . Generalized anxiety disorder 01/31/2016    Priority: High  . Postmenopausal- 30 yrs ago TAH "yrs ago" 11/12/2016    Priority:  Medium  . Hormone imbalance- txed by gyn 11/12/2016    Priority: Medium  . Mixed hyperlipidemia 11/12/2016    Priority: Medium  . Hypokalemia 02/01/2016    Priority: Medium  . Vitamin D deficiency 09/28/2016    Priority: Low  . Status post abdominal hysterectomy 12/23/2014    Priority: Low  . Elevated serum creatinine 09/04/2017  . Elevated ALT measurement 09/04/2017  . Acute biliary pancreatitis 01/31/2016  . Overweight 12/23/2014  . Surgical menopause 12/23/2014  . History of cervical dysplasia 12/23/2014  . SUI (stress urinary incontinence, female) 12/23/2014    Past Medical history, Surgical history, Family history, Social history, Allergies and Medications have been entered into the medical record, reviewed and changed as needed.    Current Meds  Medication Sig  . ALPRAZolam (XANAX) 1 MG tablet Take 1 tablet (1 mg total) by mouth 3 (three) times daily as needed for anxiety.  . Ascorbic Acid  (VITAMIN C) 1000 MG tablet Take 1,000 mg by mouth daily.  Marland Kitchen aspirin EC 81 MG tablet Take 1 tablet (81 mg total) by mouth daily.  Marland Kitchen atorvastatin (LIPITOR) 10 MG tablet Take 1 tablet (10 mg total) by mouth Nightly.  . Calcium Carbonate (CALTRATE 600 PO) Take 1 tablet by mouth daily.  Marland Kitchen estradiol (ESTRACE) 0.5 MG tablet TAKE 1 TABLET BY MOUTH  DAILY  . Melatonin (MELATONIN MAXIMUM STRENGTH) 5 MG TABS Take 5 mg by mouth at bedtime as needed (sleep).   . Multiple Vitamins-Minerals (CENTRUM SILVER PO) Take 1 tablet by mouth daily.  . Omega-3 Fatty Acids (FISH OIL) 1000 MG CAPS Take 1 capsule by mouth daily.  . progesterone (PROMETRIUM) 200 MG capsule TAKE 1 CAPSULE BY MOUTH  DAILY  . valACYclovir (VALTREX) 500 MG tablet 500 mg daily.   . Vitamin D, Ergocalciferol, (DRISDOL) 50000 units CAPS capsule Take 1 capsule (50,000 Units total) by mouth every 7 (seven) days.  . [DISCONTINUED] ALPRAZolam (XANAX) 1 MG tablet Take 1 tablet (1 mg total) by mouth 3 (three) times daily as needed for anxiety.  . [DISCONTINUED] aspirin EC 81 MG tablet Take 1 tablet (81 mg total) by mouth daily.  . [DISCONTINUED] atorvastatin (LIPITOR) 10 MG tablet Take 1 tablet (10 mg total) by mouth daily.  . [DISCONTINUED] ibuprofen (ADVIL,MOTRIN) 800 MG tablet Take 800 mg by mouth every 8 (eight) hours as needed for moderate pain.   . [DISCONTINUED] potassium chloride (K-DUR) 10 MEQ tablet Take 1 tablet (10 mEq total) by mouth daily.  . [DISCONTINUED] triamterene-hydrochlorothiazide (MAXZIDE-25) 37.5-25 MG tablet Take 1 tablet by mouth daily.    Allergies:  No Known Allergies   Review of Systems:  A fourteen system review of systems was performed and found to be positive as per HPI.   Objective:   Blood pressure (!) 143/83, pulse 87, height 5\' 7"  (1.702 m), weight 196 lb 8 oz (89.1 kg), SpO2 98 %. Body mass index is 30.78 kg/m. General:  Well Developed, well nourished, appropriate for stated age.  Neuro:  Alert and  oriented,  extra-ocular muscles intact  HEENT:  Normocephalic, atraumatic, neck supple, no carotid bruits appreciated  Skin:  no gross rash, warm, pink. Cardiac:  RRR, S1 S2 Respiratory:  ECTA B/L and A/P, Not using accessory muscles, speaking in full sentences- unlabored. Vascular:  Ext warm, no cyanosis apprec.; cap RF less 2 sec. Psych:  No HI/SI, judgement and insight good, Euthymic mood. Full Affect.

## 2017-09-04 NOTE — Patient Instructions (Signed)
Please check your blood pressure and bring in log next office visit in 6 weeks.  -  2-3 days prior to that office visit come in for a lab only visit to recheck CMP.  -Please give patient BP log.   Hypertension Hypertension, commonly called high blood pressure, is when the force of blood pumping through the arteries is too strong. The arteries are the blood vessels that carry blood from the heart throughout the body. Hypertension forces the heart to work harder to pump blood and may cause arteries to become narrow or stiff. Having untreated or uncontrolled hypertension can cause heart attacks, strokes, kidney disease, and other problems. A blood pressure reading consists of a higher number over a lower number. Ideally, your blood pressure should be below 120/80. The first ("top") number is called the systolic pressure. It is a measure of the pressure in your arteries as your heart beats. The second ("bottom") number is called the diastolic pressure. It is a measure of the pressure in your arteries as the heart relaxes. What are the causes? The cause of this condition is not known. What increases the risk? Some risk factors for high blood pressure are under your control. Others are not. Factors you can change  Smoking.  Having type 2 diabetes mellitus, high cholesterol, or both.  Not getting enough exercise or physical activity.  Being overweight.  Having too much fat, sugar, calories, or salt (sodium) in your diet.  Drinking too much alcohol. Factors that are difficult or impossible to change  Having chronic kidney disease.  Having a family history of high blood pressure.  Age. Risk increases with age.  Race. You may be at higher risk if you are African-American.  Gender. Men are at higher risk than women before age 64. After age 59, women are at higher risk than men.  Having obstructive sleep apnea.  Stress. What are the signs or symptoms? Extremely high blood pressure  (hypertensive crisis) may cause:  Headache.  Anxiety.  Shortness of breath.  Nosebleed.  Nausea and vomiting.  Severe chest pain.  Jerky movements you cannot control (seizures).  How is this diagnosed? This condition is diagnosed by measuring your blood pressure while you are seated, with your arm resting on a surface. The cuff of the blood pressure monitor will be placed directly against the skin of your upper arm at the level of your heart. It should be measured at least twice using the same arm. Certain conditions can cause a difference in blood pressure between your right and left arms. Certain factors can cause blood pressure readings to be lower or higher than normal (elevated) for a short period of time:  When your blood pressure is higher when you are in a health care provider's office than when you are at home, this is called white coat hypertension. Most people with this condition do not need medicines.  When your blood pressure is higher at home than when you are in a health care provider's office, this is called masked hypertension. Most people with this condition may need medicines to control blood pressure.  If you have a high blood pressure reading during one visit or you have normal blood pressure with other risk factors:  You may be asked to return on a different day to have your blood pressure checked again.  You may be asked to monitor your blood pressure at home for 1 week or longer.  If you are diagnosed with hypertension, you may have other  blood or imaging tests to help your health care provider understand your overall risk for other conditions. How is this treated? This condition is treated by making healthy lifestyle changes, such as eating healthy foods, exercising more, and reducing your alcohol intake. Your health care provider may prescribe medicine if lifestyle changes are not enough to get your blood pressure under control, and if:  Your systolic blood  pressure is above 130.  Your diastolic blood pressure is above 80.  Your personal target blood pressure may vary depending on your medical conditions, your age, and other factors. Follow these instructions at home: Eating and drinking  Eat a diet that is high in fiber and potassium, and low in sodium, added sugar, and fat. An example eating plan is called the DASH (Dietary Approaches to Stop Hypertension) diet. To eat this way: ? Eat plenty of fresh fruits and vegetables. Try to fill half of your plate at each meal with fruits and vegetables. ? Eat whole grains, such as whole wheat pasta, brown rice, or whole grain bread. Fill about one quarter of your plate with whole grains. ? Eat or drink low-fat dairy products, such as skim milk or low-fat yogurt. ? Avoid fatty cuts of meat, processed or cured meats, and poultry with skin. Fill about one quarter of your plate with lean proteins, such as fish, chicken without skin, beans, eggs, and tofu. ? Avoid premade and processed foods. These tend to be higher in sodium, added sugar, and fat.  Reduce your daily sodium intake. Most people with hypertension should eat less than 1,500 mg of sodium a day.  Limit alcohol intake to no more than 1 drink a day for nonpregnant women and 2 drinks a day for men. One drink equals 12 oz of beer, 5 oz of wine, or 1 oz of hard liquor. Lifestyle  Work with your health care provider to maintain a healthy body weight or to lose weight. Ask what an ideal weight is for you.  Get at least 30 minutes of exercise that causes your heart to beat faster (aerobic exercise) most days of the week. Activities may include walking, swimming, or biking.  Include exercise to strengthen your muscles (resistance exercise), such as pilates or lifting weights, as part of your weekly exercise routine. Try to do these types of exercises for 30 minutes at least 3 days a week.  Do not use any products that contain nicotine or tobacco, such  as cigarettes and e-cigarettes. If you need help quitting, ask your health care provider.  Monitor your blood pressure at home as told by your health care provider.  Keep all follow-up visits as told by your health care provider. This is important. Medicines  Take over-the-counter and prescription medicines only as told by your health care provider. Follow directions carefully. Blood pressure medicines must be taken as prescribed.  Do not skip doses of blood pressure medicine. Doing this puts you at risk for problems and can make the medicine less effective.  Ask your health care provider about side effects or reactions to medicines that you should watch for. Contact a health care provider if:  You think you are having a reaction to a medicine you are taking.  You have headaches that keep coming back (recurring).  You feel dizzy.  You have swelling in your ankles.  You have trouble with your vision. Get help right away if:  You develop a severe headache or confusion.  You have unusual weakness or numbness.  You feel faint.  You have severe pain in your chest or abdomen.  You vomit repeatedly.  You have trouble breathing. Summary  Hypertension is when the force of blood pumping through your arteries is too strong. If this condition is not controlled, it may put you at risk for serious complications.  Your personal target blood pressure may vary depending on your medical conditions, your age, and other factors. For most people, a normal blood pressure is less than 120/80.  Hypertension is treated with lifestyle changes, medicines, or a combination of both. Lifestyle changes include weight loss, eating a healthy, low-sodium diet, exercising more, and limiting alcohol. This information is not intended to replace advice given to you by your health care provider. Make sure you discuss any questions you have with your health care provider. Document Released: 04/16/2005 Document  Revised: 03/14/2016 Document Reviewed: 03/14/2016 Elsevier Interactive Patient Education  [employer].

## 2017-09-16 ENCOUNTER — Other Ambulatory Visit: Payer: Self-pay

## 2017-09-16 ENCOUNTER — Telehealth: Payer: Self-pay | Admitting: Family Medicine

## 2017-09-16 MED ORDER — VALACYCLOVIR HCL 500 MG PO TABS: 500.0000 mg | ORAL_TABLET | Freq: Every day | ORAL | 1 refills | Status: DC

## 2017-09-16 NOTE — Telephone Encounter (Signed)
Refill request for Valtrex. Medication was last filled by a pervious provider.  Patient was last seen on 09/04/17. Sent request to Dr. Raliegh Scarlet for review. MPulliam, CMA/RT(R)

## 2017-09-16 NOTE — Telephone Encounter (Signed)
Medication was last filled by a pervious provider, sent request to Dr. Raliegh Scarlet for review. MPulliam, CMA/RT(R)

## 2017-09-16 NOTE — Telephone Encounter (Signed)
Patient called states Rx refill request should have included :   valACYclovir (VALTREX) 500 MG tablet [[id number]]   Order Details  Dose: 500 mg Route: -- Frequency: Daily  Dispense Quantity: -- Refills: -- Fills remaining: --        Sig: 500 mg daily.           -----Dr. Raliegh Scarlet was "NOT" original prescriber-  Patient request Rx refill be sent to :   [place], [place] [phone] (Phone) [phone] (Fax)    --forwarding message to medical assistant.  --Dion Body

## 2017-10-14 ENCOUNTER — Other Ambulatory Visit: Payer: 59

## 2017-10-14 ENCOUNTER — Other Ambulatory Visit: Payer: Self-pay | Admitting: Family Medicine

## 2017-10-14 DIAGNOSIS — F4323 Adjustment disorder with mixed anxiety and depressed mood: Secondary | ICD-10-CM

## 2017-10-14 DIAGNOSIS — F5101 Primary insomnia: Secondary | ICD-10-CM

## 2017-10-16 ENCOUNTER — Ambulatory Visit: Payer: 59 | Admitting: Family Medicine

## 2017-10-28 ENCOUNTER — Other Ambulatory Visit: Payer: Self-pay | Admitting: Family Medicine

## 2017-10-28 ENCOUNTER — Other Ambulatory Visit: Payer: Self-pay | Admitting: Obstetrics and Gynecology

## 2017-10-28 DIAGNOSIS — Z1231 Encounter for screening mammogram for malignant neoplasm of breast: Secondary | ICD-10-CM

## 2017-11-07 ENCOUNTER — Other Ambulatory Visit: Payer: Self-pay

## 2017-11-07 ENCOUNTER — Telehealth: Payer: Self-pay | Admitting: Family Medicine

## 2017-11-07 ENCOUNTER — Other Ambulatory Visit: Payer: 59

## 2017-11-07 DIAGNOSIS — E876 Hypokalemia: Secondary | ICD-10-CM

## 2017-11-07 DIAGNOSIS — F411 Generalized anxiety disorder: Secondary | ICD-10-CM

## 2017-11-07 DIAGNOSIS — R7989 Other specified abnormal findings of blood chemistry: Secondary | ICD-10-CM

## 2017-11-07 DIAGNOSIS — R7401 Elevation of levels of liver transaminase levels: Secondary | ICD-10-CM

## 2017-11-07 DIAGNOSIS — R740 Nonspecific elevation of levels of transaminase and lactic acid dehydrogenase [LDH]: Secondary | ICD-10-CM

## 2017-11-07 NOTE — Telephone Encounter (Signed)
Patient requesting refill on Xanax.  LOV 09/04/2017 and patient has follow up on 11/11/2017 but is currently out of medication.  Medication was last filled on 09/04/17 for #30 no refills. Please review and advise. MPulliam, CMA/RT(R)

## 2017-11-07 NOTE — Telephone Encounter (Signed)
Sent request to Dr. Raliegh Scarlet to review since request is for a controlled substance. MPulliam, CMA/RT(R)

## 2017-11-07 NOTE — Telephone Encounter (Signed)
Patient has an appt next week with Dr. Jenetta Downer but is currently out of her alprazolam and is requesting a refill to be sent to [place] [facility] (not [facility]). Please advise

## 2017-11-08 ENCOUNTER — Telehealth: Payer: Self-pay | Admitting: Family Medicine

## 2017-11-08 LAB — COMPREHENSIVE METABOLIC PANEL
A/G RATIO: 1.3 (ref 1.2–2.2)
ALBUMIN: 4.4 g/dL (ref 3.6–4.8)
ALK PHOS: 143 IU/L — AB (ref 39–117)
ALT: 70 IU/L — ABNORMAL HIGH (ref 0–32)
AST: 61 IU/L — ABNORMAL HIGH (ref 0–40)
BILIRUBIN TOTAL: 0.4 mg/dL (ref 0.0–1.2)
BUN / CREAT RATIO: 24 (ref 12–28)
BUN: 25 mg/dL (ref 8–27)
CHLORIDE: 102 mmol/L (ref 96–106)
CO2: 23 mmol/L (ref 20–29)
Calcium: 10.2 mg/dL (ref 8.7–10.3)
Creatinine, Ser: 1.03 mg/dL — ABNORMAL HIGH (ref 0.57–1.00)
GFR calc Af Amer: 67 mL/min/{1.73_m2} (ref >59)
GFR calc non Af Amer: 58 mL/min/{1.73_m2} — ABNORMAL LOW (ref >59)
GLUCOSE: 146 mg/dL — AB (ref 65–99)
Globulin, Total: 3.3 g/dL (ref 1.5–4.5)
POTASSIUM: 4.6 mmol/L (ref 3.5–5.2)
Sodium: 141 mmol/L (ref 134–144)
Total Protein: 7.7 g/dL (ref 6.0–8.5)

## 2017-11-08 NOTE — Telephone Encounter (Signed)
Pt called states came by office 7/11 to request refill on :   ALPRAZolam Duanne Moron) 1 MG tablet [[id number]]   Order Details  Dose: 1 mg Route: Oral Frequency: 3 times daily PRN for anxiety  Dispense Quantity: 30 tablet Refills: 0 Fills remaining: --        Sig: Take 1 tablet (1 mg total) by mouth 3 (three) times daily as needed for anxiety.     Pt request Refill be sent to:  Preferred Pharmacies      [place], [place] - [place] [phone] (Phone) [phone] (Fax)   --See Dorothea Ogle sent message to medical assistant & it's waiting on provider approval( advised patient)--- Pt states has OV scheduled for Monday , 11/10/17  Advised patient Rx refill request policy is to allow [id number] for processing.  Forwarding message to medical assistant as an [place]

## 2017-11-11 ENCOUNTER — Ambulatory Visit (INDEPENDENT_AMBULATORY_CARE_PROVIDER_SITE_OTHER): Payer: 59 | Admitting: Family Medicine

## 2017-11-11 ENCOUNTER — Encounter: Payer: Self-pay | Admitting: Family Medicine

## 2017-11-11 VITALS — BP 128/81 | HR 81 | Ht 67.0 in | Wt 201.0 lb

## 2017-11-11 DIAGNOSIS — F5101 Primary insomnia: Secondary | ICD-10-CM

## 2017-11-11 DIAGNOSIS — I10 Essential (primary) hypertension: Secondary | ICD-10-CM | POA: Diagnosis not present

## 2017-11-11 DIAGNOSIS — R7401 Elevation of levels of liver transaminase levels: Secondary | ICD-10-CM

## 2017-11-11 DIAGNOSIS — E669 Obesity, unspecified: Secondary | ICD-10-CM

## 2017-11-11 DIAGNOSIS — R740 Nonspecific elevation of levels of transaminase and lactic acid dehydrogenase [LDH]: Secondary | ICD-10-CM

## 2017-11-11 DIAGNOSIS — F411 Generalized anxiety disorder: Secondary | ICD-10-CM

## 2017-11-11 DIAGNOSIS — E349 Endocrine disorder, unspecified: Secondary | ICD-10-CM

## 2017-11-11 DIAGNOSIS — F4323 Adjustment disorder with mixed anxiety and depressed mood: Secondary | ICD-10-CM

## 2017-11-11 DIAGNOSIS — E876 Hypokalemia: Secondary | ICD-10-CM

## 2017-11-11 DIAGNOSIS — R7989 Other specified abnormal findings of blood chemistry: Secondary | ICD-10-CM

## 2017-11-11 DIAGNOSIS — R61 Generalized hyperhidrosis: Secondary | ICD-10-CM

## 2017-11-11 DIAGNOSIS — Z723 Lack of physical exercise: Secondary | ICD-10-CM | POA: Insufficient documentation

## 2017-11-11 MED ORDER — ZOLPIDEM TARTRATE ER 12.5 MG PO TBCR: 12.5000 mg | EXTENDED_RELEASE_TABLET | Freq: Every evening | ORAL | 0 refills | Status: DC | PRN

## 2017-11-11 MED ORDER — ZOLPIDEM TARTRATE ER 12.5 MG PO TBCR
12.5000 mg | EXTENDED_RELEASE_TABLET | Freq: Every evening | ORAL | 0 refills | Status: DC | PRN
Start: 2017-11-11 — End: 2017-11-11

## 2017-11-11 MED ORDER — CITALOPRAM HYDROBROMIDE 10 MG PO TABS: ORAL_TABLET | ORAL | 0 refills | Status: DC

## 2017-11-11 MED ORDER — BUSPIRONE HCL 10 MG PO TABS: 10.0000 mg | ORAL_TABLET | Freq: Three times a day (TID) | ORAL | 5 refills | Status: DC

## 2017-11-11 MED ORDER — ALPRAZOLAM 1 MG PO TABS: 1.0000 mg | ORAL_TABLET | Freq: Three times a day (TID) | ORAL | 0 refills | Status: DC | PRN

## 2017-11-11 NOTE — Progress Notes (Signed)
Impression and Recommendations:    1. Generalized anxiety disorder   2. Adjustment disorder with mixed anxiety and depressed mood   3. Primary insomnia   4. Essential hypertension   5. Obesity, Class I, BMI 30-34.9   6. Inactivity   7. Elevated ALT measurement   8. Elevated serum creatinine   9. Hypokalemia   10. Hormone imbalance- txed by gyn   11. Excessive sweating- likely due to hormonal    1. Excessive Sweating - Per Pt, Likely Hormonal - Advised patient to return to OBGYN Dr. Gayla Medicus for management of concerns regarding hormones.  - Discussed the need to revisit medications with OBGYN and thoroughly discuss her symptoms (excessive sweating, feeling anxious, etc.).  2. Mood - GAD - Started new mood medicine; see med list below. - Buspar started today.  - Xanax refilled today.  Advised patient again that long-term chronic use of Xanax is not medically advised.  Patient understands the risks and benefits of long-term use of Xanax and understands the need to explore alternative forms of mood control.  Patient also understands the need to take Xanax as sparingly as possible.  - In the past, patient started Prozac and could not tolerate, experienced worsened excessive sweating.  - Reviewed the "spokes of the wheel" of mood management, including the need to exercise regularly, practice healthy sleep hygiene, consume a prudent diet low in sugar and caffeine, begin consulting with a life coach/counselor, and engage in meditation or prayer.  - Info provided today on GAD.  3. Insomnia - Ambien CR prescribed.  In office today, investigated [email or web address] for Ambien CR.  Patient knows to present her GoodRx card prior to filling her prescription so they will run the prescription through Good[employer].  - Also advised patient to explore the Ambien CR website and search for coupons.  - Reviewed with the patient today that she can take up to 10 mg of melatonin at a time, no more.  4.  Hypertension - Symptoms well-controlled at this time. - Patient tolerates medications well.  No changes made to treatment plan.  - Lifestyle changes such as dash diet and engaging in a regular exercise program discussed with patient.  - Ambulatory BP monitoring encouraged. Keep log and bring in next OV.  5. Labs Drawn 11/07/2017 - Liver Enzymes Elevated/Fatty Liver - Patient's liver enzymes still elevated.  - Advised patient to lose weight to help control this.  Install the LoseIt or MyFitnessPal app to begin tracking nutritional intake.  Last abdominal CT scan (liver) 05/14/2014 - IMPRESSION: 1. Cholelithiasis without sonographic findings for acute cholecystitis. 2. Dilated common bile duct. ERCP or MRCP may be helpful for further evaluation. 3. Diffuse fatty infiltration of the liver.  - Kidney Function = WNL. - Continue to strive toward adequate hydration.  6. BMI Counseling Explained to patient what BMI refers to, and what it means medically.    Told patient to think about it as a "medical risk stratification measurement" and how increasing BMI is associated with increasing risk/ or worsening state of various diseases such as hypertension, hyperlipidemia, diabetes, premature OA, depression etc.  American Heart Association guidelines for healthy diet, basically Mediterranean diet, and exercise guidelines of 30 minutes 5 days per week or more discussed in detail.  Health counseling performed.  All questions answered.  7. Lifestyle & Preventative Health Maintenance - Advised patient to continue working toward exercising to improve overall mental, physical, and emotional health.    - Patient is Inactive.  Encouraged patient to engage in daily physical activity.  Recommended that the patient eventually strive for at least 150 minutes of moderate cardiovascular activity per week according to guidelines established by the [facility].   - Healthy dietary habits encouraged, including  low-carb, and high amounts of lean protein in diet.   - Patient should also consume adequate amounts of water - half of body weight in oz of water per day.   Education and routine counseling performed. Handouts provided.  8. Follow-Up - Return in 8 weeks to evaluate progress on newly prescribed mood medicines, Buspar, Ambien CR, and weight loss goals.  Pt was in the office today for 32.5+ minutes, with over 50% time spent in face to face counseling of patients various medical conditions, treatment plans of those medical conditions including medicine management and lifestyle modification, strategies to improve health and well being; and in coordination of care. SEE ABOVE TREATMENT PLAN FOR DETAILS   No orders of the defined types were placed in this encounter.   Meds ordered this encounter  Medications  . ALPRAZolam (XANAX) 1 MG tablet    Sig: Take 1 tablet (1 mg total) by mouth 3 (three) times daily as needed for anxiety.    Dispense:  30 tablet    Refill:  0  . DISCONTD: zolpidem (AMBIEN CR) 12.5 MG CR tablet    Sig: Take 1 tablet (12.5 mg total) by mouth at bedtime as needed for sleep.    Dispense:  30 tablet    Refill:  0  . citalopram (CELEXA) 10 MG tablet    Sig: 5 mg daily x2 weeks then increase to 1 tab daily    Dispense:  90 tablet    Refill:  0  . busPIRone (BUSPAR) 10 MG tablet    Sig: Take 1 tablet (10 mg total) by mouth 3 (three) times daily.    Dispense:  90 tablet    Refill:  5  . zolpidem (AMBIEN CR) 12.5 MG CR tablet    Sig: Take 1 tablet (12.5 mg total) by mouth at bedtime as needed for sleep.    Dispense:  90 tablet    Refill:  0   The patient was counseled, risk factors were discussed, anticipatory guidance given.  Gross side effects, risk and benefits, and alternatives of medications discussed with patient.  Patient is aware that all medications have potential side effects and we are unable to predict every side effect or drug-drug interaction that may  occur.  Expresses verbal understanding and consents to current therapy plan and treatment regimen.  Return for 8wks after starting back on ambien CR, celexa, and buspar and going to GYN.  Please see AVS handed out to patient at the end of our visit for further patient instructions/ counseling done pertaining to today's office visit.    Note: This document was prepared using Dragon voice recognition software and may include unintentional dictation errors.  This document serves as a record of services personally performed by Mellody Dance, DO. It was created on her behalf by [name], a trained medical scribe. The creation of this record is based on the scribe's personal observations and the provider's statements to them.   I have reviewed the above medical documentation for accuracy and completeness and I concur.  Mellody Dance 11/24/17 9:09 PM    Subjective:    HPI: Mercedes Webb is a 62 y.o. female who presents to [place] [facility] today for follow up for  HTN.    Notes she's been trying to drink more water.  Elevated Liver Enzymes - Chronic Since Pancreatitis Patient feels that ever since she's had pancreatitis, her liver enzymes have been elevated.  Patient's regular GI is Dr. Earlean Shawl; her last visit was reportedly over two years ago, maybe three.  Acute Episodes of Sweating - Patient Suspects Hormonally Related States that she's sweating bullets today.  Notes that she starts sweating and "I don't know if it's stress, or some of the meds that I'm on."  Patient sees OBGYN Dr. Gayla Medicus once yearly and isn't sure if this is anxiety, or something hormonal that OBGYN could handle.    Notes she was formerly told that her hormones were "out of whack" and thta she needed to have her estrogen lowered and her progesterone increased.  Now patient worries that she's reacting to the progesterone, causing her hot flashes to exacerbate.  Patient states that  at last OBGYN appointment, she was placed on vitamin D, potassium, one prescription she stopped taking, and another she cannot remember.  States she never got a good sense of why these prescriptions were ordered.  Mood Patient is aware that long-term chronic use of Xanax is not advised.  Patient was formerly using Xanax up to three times per day PRN.  She was happy to sign her controlled substance contract.today but states "if I'm not going to take (Xanax) I need something else."  In the past, patient started Prozac and experienced worsened excessive sweating.  Believes that her "sweats come out of nowhere."  Patient is not exercising; "not as much as I should be."  Insomnia Patient is not sleeping.  Notes she used to take Ambien extended release, but stopped after she switched to regular, which gives her weird dreams.  She takes melatonin; "sometimes I'll take four of them a night," per directions on her bottle.  Patient believes she gets about 3 hours of "good sleep" per night.  The rest of the night she's tossing and turning, waking up, etc.  She's had sleep issues for many years.  She tried another sleep aid in the past that didn't work; she cannot remember the name of it.  HTN:  -  Her blood pressure has been controlled at home.   - Patient reports good compliance with blood pressure medications.  - Denies medication S-E.   - Smoking Status noted   - She denies new onset of: chest pain, exercise intolerance, shortness of breath, dizziness, visual changes, headache, lower extremity swelling or claudication.   Last 3 blood pressure readings in our office are as follows: BP Readings from Last 3 Encounters:  11/11/17 128/81  09/04/17 (!) 143/83  06/28/17 126/82    Pulse Readings from Last 3 Encounters:  11/11/17 81  09/04/17 87  06/28/17 79    Filed Weights   11/11/17 1508  Weight: 201 lb (91.2 kg)    Depression screen [facility] 2/9 11/11/2017 09/04/2017 06/28/2017 05/28/2017  01/21/2017  Decreased Interest 2 0 1 0 2  Down, Depressed, Hopeless 1 0 1 0 2  PHQ - 2 Score 3 0 2 0 4  Altered sleeping 3 - 1 1 2   Tired, decreased energy 2 - 1 1 3   Change in appetite 2 - 1 2 3   Feeling bad or failure about yourself  0 - 1 1 2   Trouble concentrating 1 - 0 0 3  Moving slowly or fidgety/restless 0 - 0 0 2  Suicidal thoughts 0 - 0 0  0  PHQ-9 Score 11 - 6 5 19   Difficult doing work/chores Not difficult at all - Not difficult at all Somewhat difficult Somewhat difficult    Patient Care Team    Relationship Specialty Notifications Start End  Mellody Dance, DO PCP - General Family Medicine  11/26/16   Defrancesco, Alanda Slim, MD Consulting Physician Obstetrics and Gynecology  11/12/16   Joylene Igo, CNM Midwife Obstetrics and Gynecology  11/12/16   Danella Sensing, MD Consulting Physician Dermatology  11/12/16   Richmond Campbell, MD Consulting Physician Gastroenterology  11/26/16   Carol Ada, MD Consulting Physician Gastroenterology  11/26/16   Keene Breath., MD  Ophthalmology  11/26/16   Joylene Igo, CNM Midwife Obstetrics and Gynecology  11/26/16    Comment: Prescribes her Seroquel     Lab Results  Component Value Date   CREATININE 1.03 (H) 11/07/2017   BUN 25 11/07/2017   NA 141 11/07/2017   K 4.6 11/07/2017   CL 102 11/07/2017   CO2 23 11/07/2017    Lab Results  Component Value Date   CHOL 136 08/29/2017   CHOL 191 01/14/2017   CHOL 208 (H) 09/20/2016    Lab Results  Component Value Date   HDL 45 08/29/2017   HDL 52 01/14/2017   HDL 47 09/20/2016    Lab Results  Component Value Date   LDLCALC 50 08/29/2017   [place] Comment 01/14/2017   LDLCALC 84 09/20/2016    Lab Results  Component Value Date   TRIG 206 (H) 08/29/2017   TRIG 408 (H) 01/14/2017   TRIG 386 (H) 09/20/2016    Lab Results  Component Value Date   CHOLHDL 3.0 08/29/2017   CHOLHDL 3.7 01/14/2017   CHOLHDL 4.4 09/20/2016    No results found for:  LDLDIRECT ===================================================================   Patient Active Problem List   Diagnosis Date Noted  . Obesity, Class I, BMI 30-34.9 06/10/2017    Priority: High  . Primary insomnia 11/12/2016    Priority: High  . Adjustment disorder with mixed anxiety and depressed mood 11/12/2016    Priority: High  . Elevated cholesterol with elevated triglycerides 09/28/2016    Priority: High  . Essential hypertension 01/31/2016    Priority: High  . Generalized anxiety disorder 01/31/2016    Priority: High  . Postmenopausal- 30 yrs ago TAH "yrs ago" 11/12/2016    Priority: Medium  . Hormone imbalance- txed by gyn 11/12/2016    Priority: Medium  . Mixed hyperlipidemia 11/12/2016    Priority: Medium  . Hypokalemia 02/01/2016    Priority: Medium  . Vitamin D deficiency 09/28/2016    Priority: Low  . Status post abdominal hysterectomy 12/23/2014    Priority: Low  . Excessive sweating- likely due to hormonal 11/11/2017  . Inactivity 11/11/2017  . Elevated serum creatinine 09/04/2017  . Elevated ALT measurement 09/04/2017  . Acute biliary pancreatitis 01/31/2016  . Overweight 12/23/2014  . Surgical menopause 12/23/2014  . History of cervical dysplasia 12/23/2014  . SUI (stress urinary incontinence, female) 12/23/2014     Past Medical History:  Diagnosis Date  . Anxiety   . Cancer ([place]) 2016   Skin CA squamous cell on bilateral feet removed  . Cervical dysplasia    h/o  . Colon polyps    polyps on first scope ~ 2006, no recurrent polyps ~ 2001 and in 2016.   . Endometriosis   . Family history of adverse reaction to anesthesia    Mother had difficulty waking & nausea  .  Headache   . Hypertension   . Insomnia   . Pancreatitis 01/2016  . Recurrent cold sores      Past Surgical History:  Procedure Laterality Date  . ABDOMINAL HYSTERECTOMY     tah/bso- endometriosis  . CHOLECYSTECTOMY    . ERCP N/A 02/03/2016   Procedure: ENDOSCOPIC RETROGRADE  CHOLANGIOPANCREATOGRAPHY (ERCP);  Surgeon: Carol Ada, MD;  Location: [facility] ENDOSCOPY;  Service: Endoscopy;  Laterality: N/A;  . KNEE ARTHROSCOPY    . tonsillectomy    . TONSILLECTOMY    . TUBAL LIGATION       Family History  Problem Relation Age of Onset  . Diabetes Father   . Heart disease Father   . Breast cancer Sister 43  . Diabetes Brother   . Colon cancer Neg Hx   . Ovarian cancer Neg Hx      Social History   Substance and Sexual Activity  Drug Use No  ,  Social History   Substance and Sexual Activity  Alcohol Use Yes   Comment: occas  ,  Social History   Tobacco Use  Smoking Status Former Smoker  . Last attempt to quit: 04/30/1988  . Years since quitting: 29.5  Smokeless Tobacco Never Used  ,    Current Outpatient Medications on File Prior to Visit  Medication Sig Dispense Refill  . Ascorbic Acid (VITAMIN C) 1000 MG tablet Take 1,000 mg by mouth daily.    Marland Kitchen aspirin EC 81 MG tablet Take 1 tablet (81 mg total) by mouth daily. 90 tablet 1  . atorvastatin (LIPITOR) 10 MG tablet Take 1 tablet (10 mg total) by mouth Nightly. 90 tablet 1  . Calcium Carbonate (CALTRATE 600 PO) Take 1 tablet by mouth daily.    Marland Kitchen estradiol (ESTRACE) 0.5 MG tablet TAKE 1 TABLET BY MOUTH  DAILY 90 tablet 4  . losartan-hydrochlorothiazide (HYZAAR) 100-12.5 MG tablet TAKE ONE-HALF TABLET BY  MOUTH DAILY 45 tablet 0  . Melatonin (MELATONIN MAXIMUM STRENGTH) 5 MG TABS Take 5 mg by mouth at bedtime as needed (sleep).     . Multiple Vitamins-Minerals (CENTRUM SILVER PO) Take 1 tablet by mouth daily.    . Omega-3 Fatty Acids (FISH OIL) 1000 MG CAPS Take 1 capsule by mouth daily.    . progesterone (PROMETRIUM) 200 MG capsule TAKE 1 CAPSULE BY MOUTH  DAILY 90 capsule 3  . valACYclovir (VALTREX) 500 MG tablet Take 1 tablet (500 mg total) by mouth daily. 90 tablet 1  . Vitamin D, Ergocalciferol, (DRISDOL) 50000 units CAPS capsule Take 1 capsule (50,000 Units total) by mouth every 7 (seven) days. 60  capsule 1   No current facility-administered medications on file prior to visit.      Allergies  Allergen Reactions  . Prozac [Fluoxetine Hcl] Other (See Comments)    sweating     Review of Systems:   General:  Denies fever, chills Optho/Auditory:   Denies visual changes, blurred vision Respiratory:   Denies SOB, cough, wheeze, DIB  Cardiovascular:   Denies chest pain, palpitations, painful respirations Gastrointestinal:   Denies nausea, vomiting, diarrhea.  Endocrine:     Denies new hot or cold intolerance Musculoskeletal:  Denies joint swelling, gait issues, or new unexplained myalgias/ arthralgias Skin:  Denies rash, suspicious lesions  Neurological:    Denies dizziness, unexplained weakness, numbness  Psychiatric/Behavioral:   Denies mood changes  Objective:    Blood pressure 128/81, pulse 81, height 5\' 7"  (1.702 m), weight 201 lb (91.2 kg), SpO2 98 %.  Body mass index is 31.48 kg/m.  General: Well Developed, well nourished, and in no acute distress.  HEENT: Normocephalic, atraumatic, pupils equal round reactive to light, neck supple, No carotid bruits, no JVD Skin: Warm and dry, cap RF less 2 sec Cardiac: Regular rate and rhythm, S1, S2 WNL's, no murmurs rubs or gallops Respiratory: ECTA B/L, Not using accessory muscles, speaking in full sentences. NeuroM-Sk: Ambulates w/o assistance, moves ext * 4 w/o difficulty, sensation grossly intact.  Ext: scant edema b/l lower ext Psych: No HI/SI, judgement and insight good, Euthymic mood. Full Affect.

## 2017-11-11 NOTE — Patient Instructions (Addendum)
1. Follow up with Dr. Gayla Medicus for concerns regarding hormone replacement therapy and excessive sweating.  2. Weight loss is our goal!  Exercise, eat more prudently - utilize LoseIt, MyFitnessPal, or Weight Watchers for assistance!  3. Exercise and consume adequate amounts of water!  Just walking would be great for your mind, sleep, and body.  Both paroxetine and citalopram has been shown to help with hot flashes, we will start the citalopram which will also help with anxiety along with the BuSpar.  Please use the good Rx to get your Ambien CR refilled and ask around to various pharmacies to make sure the use the good Rx card     Generalized Anxiety Disorder, Adult  Generalized anxiety disorder (GAD) is a mental health disorder. People with this condition constantly worry about everyday events. Unlike normal anxiety, worry related to GAD is not triggered by a specific event. These worries also do not fade or get better with time. GAD interferes with life functions, including relationships, work, and school. GAD can vary from mild to severe. People with severe GAD can have intense waves of anxiety with physical symptoms (panic attacks). What are the causes? The exact cause of GAD is not known. What increases the risk? This condition is more likely to develop in:  Women.  People who have a family history of anxiety disorders.  People who are very shy.  People who experience very stressful life events, such as the death of a loved one.  People who have a very stressful family environment.  What are the signs or symptoms? People with GAD often worry excessively about many things in their lives, such as their health and family. They may also be overly concerned about:  Doing well at work.  Being on time.  Natural disasters.  Friendships.  Physical symptoms of GAD include:  Fatigue.  Muscle tension or having muscle twitches.  Trembling or feeling shaky.  Being easily  startled.  Feeling like your heart is pounding or racing.  Feeling out of breath or like you cannot take a deep breath.  Having trouble falling asleep or staying asleep.  Sweating.  Nausea, diarrhea, or irritable bowel syndrome (IBS).  Headaches.  Trouble concentrating or remembering facts.  Restlessness.  Irritability.  How is this diagnosed? Your health care provider can diagnose GAD based on your symptoms and medical history. You will also have a physical exam. The health care provider will ask specific questions about your symptoms, including how severe they are, when they started, and if they come and go. Your health care provider may ask you about your use of alcohol or drugs, including prescription medicines. Your health care provider may refer you to a mental health specialist for further evaluation. Your health care provider will do a thorough examination and may perform additional tests to rule out other possible causes of your symptoms. To be diagnosed with GAD, a person must have anxiety that:  Is out of his or her control.  Affects several different aspects of his or her life, such as work and relationships.  Causes distress that makes him or her unable to take part in normal activities.  Includes at least three physical symptoms of GAD, such as restlessness, fatigue, trouble concentrating, irritability, muscle tension, or sleep problems.  Before your health care provider can confirm a diagnosis of GAD, these symptoms must be present more days than they are not, and they must last for six months or longer. How is this treated? The following  therapies are usually used to treat GAD:  Medicine. Antidepressant medicine is usually prescribed for long-term daily control. Antianxiety medicines may be added in severe cases, especially when panic attacks occur.  Talk therapy (psychotherapy). Certain types of talk therapy can be helpful in treating GAD by providing support,  education, and guidance. Options include: ? Cognitive behavioral therapy (CBT). People learn coping skills and techniques to ease their anxiety. They learn to identify unrealistic or negative thoughts and behaviors and to replace them with positive ones. ? Acceptance and commitment therapy (ACT). This treatment teaches people how to be mindful as a way to cope with unwanted thoughts and feelings. ? Biofeedback. This process trains you to manage your body's response (physiological response) through breathing techniques and relaxation methods. You will work with a therapist while machines are used to monitor your physical symptoms.  Stress management techniques. These include yoga, meditation, and exercise.  A mental health specialist can help determine which treatment is best for you. Some people see improvement with one type of therapy. However, other people require a combination of therapies. Follow these instructions at home:  Take over-the-counter and prescription medicines only as told by your health care provider.  Try to maintain a normal routine.  Try to anticipate stressful situations and allow extra time to manage them.  Practice any stress management or self-calming techniques as taught by your health care provider.  Do not punish yourself for setbacks or for not making progress.  Try to recognize your accomplishments, even if they are small.  Keep all follow-up visits as told by your health care provider. This is important. Contact a health care provider if:  Your symptoms do not get better.  Your symptoms get worse.  You have signs of depression, such as: ? A persistently sad, cranky, or irritable mood. ? Loss of enjoyment in activities that used to bring you joy. ? Change in weight or eating. ? Changes in sleeping habits. ? Avoiding friends or family members. ? Loss of energy for normal tasks. ? Feelings of guilt or worthlessness. Get help right away if:  You have  serious thoughts about hurting yourself or others. If you ever feel like you may hurt yourself or others, or have thoughts about taking your own life, get help right away. You can go to your nearest emergency department or call:  Your local emergency services (911 in the [place]  A suicide crisis helpline, such as the [place] at 978-443-0667. This is open 24 hours a day.  Summary  Generalized anxiety disorder (GAD) is a mental health disorder that involves worry that is not triggered by a specific event.  People with GAD often worry excessively about many things in their lives, such as their health and family.  GAD may cause physical symptoms such as restlessness, trouble concentrating, sleep problems, frequent sweating, nausea, diarrhea, headaches, and trembling or muscle twitching.  A mental health specialist can help determine which treatment is best for you. Some people see improvement with one type of therapy. However, other people require a combination of therapies. This information is not intended to replace advice given to you by your health care provider. Make sure you discuss any questions you have with your health care provider. Document Released: 08/11/2012 Document Revised: 03/06/2016 Document Reviewed: 03/06/2016 Elsevier Interactive Patient Education  2018 [place]  After being diagnosed with an anxiety disorder, you may be relieved to know why you have  felt or behaved a certain way. It is natural to also feel overwhelmed about the treatment ahead and what it will mean for your life. With care and support, you can manage this condition and recover from it. How to cope with anxiety Dealing with stress Stress is your bodys reaction to life changes and events, both good and bad. Stress can last just a few hours or it can be ongoing. Stress can play a major role in anxiety, so it is important to learn both how to  cope with stress and how to think about it differently. Talk with your health care provider or a counselor to learn more about stress reduction. He or she may suggest some stress reduction techniques, such as:  Music therapy. This can include creating or listening to music that you enjoy and that inspires you.  Mindfulness-based meditation. This involves being aware of your normal breaths, rather than trying to control your breathing. It can be done while sitting or walking.  Centering prayer. This is a kind of meditation that involves focusing on a word, phrase, or sacred image that is meaningful to you and that brings you peace.  Deep breathing. To do this, expand your stomach and inhale slowly through your nose. Hold your breath for 3-5 seconds. Then exhale slowly, allowing your stomach muscles to relax.  Self-talk. This is a skill where you identify thought patterns that lead to anxiety reactions and correct those thoughts.  Muscle relaxation. This involves tensing muscles then relaxing them.  Choose a stress reduction technique that fits your lifestyle and personality. Stress reduction techniques take time and practice. Set aside 5-15 minutes a day to do them. Therapists can offer training in these techniques. The training may be covered by some insurance plans. Other things you can do to manage stress include:  Keeping a stress diary. This can help you learn what triggers your stress and ways to control your response.  Thinking about how you respond to certain situations. You may not be able to control everything, but you can control your reaction.  Making time for activities that help you relax, and not feeling guilty about spending your time in this way.  Therapy combined with coping and stress-reduction skills provides the best chance for successful treatment. Medicines Medicines can help ease symptoms. Medicines for anxiety include:  Anti-anxiety  drugs.  Antidepressants.  Beta-blockers.  Medicines may be used as the main treatment for anxiety disorder, along with therapy, or if other treatments are not working. Medicines should be prescribed by a health care provider. Relationships Relationships can play a big part in helping you recover. Try to spend more time connecting with trusted friends and family members. Consider going to couples counseling, taking family education classes, or going to family therapy. Therapy can help you and others better understand the condition. How to recognize changes in your condition Everyone has a different response to treatment for anxiety. Recovery from anxiety happens when symptoms decrease and stop interfering with your daily activities at home or work. This may mean that you will start to:  Have better concentration and focus.  Sleep better.  Be less irritable.  Have more energy.  Have improved memory.  It is important to recognize when your condition is getting worse. Contact your health care provider if your symptoms interfere with home or work and you do not feel like your condition is improving. Where to find help and support: You can get help and support from these sources:  Self-help groups.  Online and [employer].  A trusted spiritual leader.  Couples counseling.  Family education classes.  Family therapy.  Follow these instructions at home:  Eat a healthy diet that includes plenty of vegetables, fruits, whole grains, low-fat dairy products, and lean protein. Do not eat a lot of foods that are high in solid fats, added sugars, or salt.  Exercise. Most adults should do the following: ? Exercise for at least 150 minutes each week. The exercise should increase your heart rate and make you sweat (moderate-intensity exercise). ? Strengthening exercises at least twice a week.  Cut down on caffeine, tobacco, alcohol, and other potentially harmful  substances.  Get the right amount and quality of sleep. Most adults need 7-9 hours of sleep each night.  Make choices that simplify your life.  Take over-the-counter and prescription medicines only as told by your health care provider.  Avoid caffeine, alcohol, and certain over-the-counter cold medicines. These may make you feel worse. Ask your pharmacist which medicines to avoid.  Keep all follow-up visits as told by your health care provider. This is important. Questions to ask your health care provider  Would I benefit from therapy?  How often should I follow up with a health care provider?  How long do I need to take medicine?  Are there any long-term side effects of my medicine?  Are there any alternatives to taking medicine? Contact a health care provider if:  You have a hard time staying focused or finishing daily tasks.  You spend many hours a day feeling worried about everyday life.  You become exhausted by worry.  You start to have headaches, feel tense, or have nausea.  You urinate more than normal.  You have diarrhea. Get help right away if:  You have a racing heart and shortness of breath.  You have thoughts of hurting yourself or others. If you ever feel like you may hurt yourself or others, or have thoughts about taking your own life, get help right away. You can go to your nearest emergency department or call:  Your local emergency services (911 in the [place]  A suicide crisis helpline, such as the [place] at (708)124-1341. This is open 24-hours a day.  Summary  Taking steps to deal with stress can help calm you.  Medicines cannot cure anxiety disorders, but they can help ease symptoms.  Family, friends, and partners can play a big part in helping you recover from an anxiety disorder. This information is not intended to replace advice given to you by your health care provider. Make sure you discuss any questions you  have with your health care provider. Document Released: 04/10/2016 Document Revised: 04/10/2016 Document Reviewed: 04/10/2016 Elsevier Interactive Patient Education  [employer].

## 2017-11-11 NOTE — Telephone Encounter (Signed)
Patient seen in the office today 11/11/2017. MPulliam, CMA/RT(R)

## 2017-11-11 NOTE — Telephone Encounter (Signed)
Per Dr. Raliegh Scarlet patient needs OV for refills.  Patient already has appointment for today at 3 80. MPulliam, CMA/RT(R)

## 2017-11-11 NOTE — Telephone Encounter (Signed)
Please run the [place] controlled substance reporting system report on her.  Also, if she has had more than 2 prescriptions in the past 1 year, we cannot give her another without having her enter a controlled substance contract with [place]

## 2017-11-21 ENCOUNTER — Ambulatory Visit
Admission: RE | Admit: 2017-11-21 | Discharge: 2017-11-21 | Disposition: A | Discharge status: Home / Self Care | Payer: 59 | Source: Ambulatory Visit | Attending: Obstetrics and Gynecology | Admitting: Obstetrics and Gynecology

## 2017-11-21 DIAGNOSIS — Z1231 Encounter for screening mammogram for malignant neoplasm of breast: Secondary | ICD-10-CM

## 2018-01-13 ENCOUNTER — Other Ambulatory Visit: Payer: Self-pay | Admitting: Family Medicine

## 2018-01-20 ENCOUNTER — Ambulatory Visit (INDEPENDENT_AMBULATORY_CARE_PROVIDER_SITE_OTHER): Payer: 59 | Admitting: Family Medicine

## 2018-01-20 ENCOUNTER — Encounter: Payer: Self-pay | Admitting: Family Medicine

## 2018-01-20 VITALS — BP 138/84 | HR 67 | Ht 67.0 in | Wt 191.8 lb

## 2018-01-20 DIAGNOSIS — R7401 Elevation of levels of liver transaminase levels: Secondary | ICD-10-CM

## 2018-01-20 DIAGNOSIS — F410 Panic disorder [episodic paroxysmal anxiety] without agoraphobia: Secondary | ICD-10-CM | POA: Insufficient documentation

## 2018-01-20 DIAGNOSIS — F5101 Primary insomnia: Secondary | ICD-10-CM | POA: Diagnosis not present

## 2018-01-20 DIAGNOSIS — R7989 Other specified abnormal findings of blood chemistry: Secondary | ICD-10-CM

## 2018-01-20 DIAGNOSIS — E782 Mixed hyperlipidemia: Secondary | ICD-10-CM

## 2018-01-20 DIAGNOSIS — F411 Generalized anxiety disorder: Secondary | ICD-10-CM | POA: Diagnosis not present

## 2018-01-20 DIAGNOSIS — F39 Unspecified mood [affective] disorder: Secondary | ICD-10-CM

## 2018-01-20 DIAGNOSIS — E876 Hypokalemia: Secondary | ICD-10-CM

## 2018-01-20 DIAGNOSIS — I10 Essential (primary) hypertension: Secondary | ICD-10-CM

## 2018-01-20 DIAGNOSIS — E559 Vitamin D deficiency, unspecified: Secondary | ICD-10-CM

## 2018-01-20 DIAGNOSIS — R740 Nonspecific elevation of levels of transaminase and lactic acid dehydrogenase [LDH]: Secondary | ICD-10-CM

## 2018-01-20 DIAGNOSIS — E669 Obesity, unspecified: Secondary | ICD-10-CM

## 2018-01-20 MED ORDER — TRIAMTERENE-HCTZ 37.5-25 MG PO TABS: 1.0000 | ORAL_TABLET | Freq: Every day | ORAL | 1 refills | Status: DC

## 2018-01-20 MED ORDER — ZOLPIDEM TARTRATE ER 12.5 MG PO TBCR: 12.5000 mg | EXTENDED_RELEASE_TABLET | Freq: Every evening | ORAL | 0 refills | Status: DC | PRN

## 2018-01-20 MED ORDER — VITAMIN D (ERGOCALCIFEROL) 1.25 MG (50000 UNIT) PO CAPS: 50000.0000 [IU] | ORAL_CAPSULE | ORAL | 3 refills | Status: DC

## 2018-01-20 MED ORDER — ALPRAZOLAM 1 MG PO TABS: 1.0000 mg | ORAL_TABLET | ORAL | 0 refills | Status: DC | PRN

## 2018-01-20 MED ORDER — CITALOPRAM HYDROBROMIDE 20 MG PO TABS: ORAL_TABLET | ORAL | 1 refills | Status: DC

## 2018-01-20 MED ORDER — ASPIRIN EC 81 MG PO TBEC: 81.0000 mg | DELAYED_RELEASE_TABLET | Freq: Every day | ORAL | 3 refills | Status: DC

## 2018-01-20 MED ORDER — ATORVASTATIN CALCIUM 10 MG PO TABS: 10.0000 mg | ORAL_TABLET | Freq: Every evening | ORAL | 1 refills | Status: DC

## 2018-01-20 MED ORDER — POTASSIUM CHLORIDE CRYS ER 10 MEQ PO TBCR: 10.0000 meq | EXTENDED_RELEASE_TABLET | Freq: Every day | ORAL | 1 refills | Status: DC

## 2018-01-20 NOTE — Patient Instructions (Addendum)
Inc celexa from 10mg  to 20mg  daily.     Generalized Anxiety Disorder, Adult  Generalized anxiety disorder (GAD) is a mental health disorder. People with this condition constantly worry about everyday events. Unlike normal anxiety, worry related to GAD is not triggered by a specific event. These worries also do not fade or get better with time. GAD interferes with life functions, including relationships, work, and school. GAD can vary from mild to severe. People with severe GAD can have intense waves of anxiety with physical symptoms (panic attacks). What are the causes? The exact cause of GAD is not known. What increases the risk? This condition is more likely to develop in:  Women.  People who have a family history of anxiety disorders.  People who are very shy.  People who experience very stressful life events, such as the death of a loved one.  People who have a very stressful family environment.  What are the signs or symptoms? People with GAD often worry excessively about many things in their lives, such as their health and family. They may also be overly concerned about:  Doing well at work.  Being on time.  Natural disasters.  Friendships.  Physical symptoms of GAD include:  Fatigue.  Muscle tension or having muscle twitches.  Trembling or feeling shaky.  Being easily startled.  Feeling like your heart is pounding or racing.  Feeling out of breath or like you cannot take a deep breath.  Having trouble falling asleep or staying asleep.  Sweating.  Nausea, diarrhea, or irritable bowel syndrome (IBS).  Headaches.  Trouble concentrating or remembering facts.  Restlessness.  Irritability.  How is this diagnosed? Your health care provider can diagnose GAD based on your symptoms and medical history. You will also have a physical exam. The health care provider will ask specific questions about your symptoms, including how severe they are, when they  started, and if they come and go. Your health care provider may ask you about your use of alcohol or drugs, including prescription medicines. Your health care provider may refer you to a mental health specialist for further evaluation. Your health care provider will do a thorough examination and may perform additional tests to rule out other possible causes of your symptoms. To be diagnosed with GAD, a person must have anxiety that:  Is out of his or her control.  Affects several different aspects of his or her life, such as work and relationships.  Causes distress that makes him or her unable to take part in normal activities.  Includes at least three physical symptoms of GAD, such as restlessness, fatigue, trouble concentrating, irritability, muscle tension, or sleep problems.  Before your health care provider can confirm a diagnosis of GAD, these symptoms must be present more days than they are not, and they must last for six months or longer. How is this treated? The following therapies are usually used to treat GAD:  Medicine. Antidepressant medicine is usually prescribed for long-term daily control. Antianxiety medicines may be added in severe cases, especially when panic attacks occur.  Talk therapy (psychotherapy). Certain types of talk therapy can be helpful in treating GAD by providing support, education, and guidance. Options include: ? Cognitive behavioral therapy (CBT). People learn coping skills and techniques to ease their anxiety. They learn to identify unrealistic or negative thoughts and behaviors and to replace them with positive ones. ? Acceptance and commitment therapy (ACT). This treatment teaches people how to be mindful as a way to cope  with unwanted thoughts and feelings. ? Biofeedback. This process trains you to manage your body's response (physiological response) through breathing techniques and relaxation methods. You will work with a therapist while machines are used  to monitor your physical symptoms.  Stress management techniques. These include yoga, meditation, and exercise.  A mental health specialist can help determine which treatment is best for you. Some people see improvement with one type of therapy. However, other people require a combination of therapies. Follow these instructions at home:  Take over-the-counter and prescription medicines only as told by your health care provider.  Try to maintain a normal routine.  Try to anticipate stressful situations and allow extra time to manage them.  Practice any stress management or self-calming techniques as taught by your health care provider.  Do not punish yourself for setbacks or for not making progress.  Try to recognize your accomplishments, even if they are small.  Keep all follow-up visits as told by your health care provider. This is important. Contact a health care provider if:  Your symptoms do not get better.  Your symptoms get worse.  You have signs of depression, such as: ? A persistently sad, cranky, or irritable mood. ? Loss of enjoyment in activities that used to bring you joy. ? Change in weight or eating. ? Changes in sleeping habits. ? Avoiding friends or family members. ? Loss of energy for normal tasks. ? Feelings of guilt or worthlessness. Get help right away if:  You have serious thoughts about hurting yourself or others. If you ever feel like you may hurt yourself or others, or have thoughts about taking your own life, get help right away. You can go to your nearest emergency department or call:  Your local emergency services (911 in the [place]  A suicide crisis helpline, such as the [place] at 239-396-5609. This is open 24 hours a day.  Summary  Generalized anxiety disorder (GAD) is a mental health disorder that involves worry that is not triggered by a specific event.  People with GAD often worry excessively about many  things in their lives, such as their health and family.  GAD may cause physical symptoms such as restlessness, trouble concentrating, sleep problems, frequent sweating, nausea, diarrhea, headaches, and trembling or muscle twitching.  A mental health specialist can help determine which treatment is best for you. Some people see improvement with one type of therapy. However, other people require a combination of therapies. This information is not intended to replace advice given to you by your health care provider. Make sure you discuss any questions you have with your health care provider. Document Released: 08/11/2012 Document Revised: 03/06/2016 Document Reviewed: 03/06/2016 Elsevier Interactive Patient Education  2018 [place]  After being diagnosed with an anxiety disorder, you may be relieved to know why you have felt or behaved a certain way. It is natural to also feel overwhelmed about the treatment ahead and what it will mean for your life. With care and support, you can manage this condition and recover from it. How to cope with anxiety Dealing with stress Stress is your body's reaction to life changes and events, both good and bad. Stress can last just a few hours or it can be ongoing. Stress can play a major role in anxiety, so it is important to learn both how to cope with stress and how to think about it differently. Talk with your health care provider  or a counselor to learn more about stress reduction. He or she may suggest some stress reduction techniques, such as:  Music therapy. This can include creating or listening to music that you enjoy and that inspires you.  Mindfulness-based meditation. This involves being aware of your normal breaths, rather than trying to control your breathing. It can be done while sitting or walking.  Centering prayer. This is a kind of meditation that involves focusing on a word, phrase, or sacred image that is  meaningful to you and that brings you peace.  Deep breathing. To do this, expand your stomach and inhale slowly through your nose. Hold your breath for 3-5 seconds. Then exhale slowly, allowing your stomach muscles to relax.  Self-talk. This is a skill where you identify thought patterns that lead to anxiety reactions and correct those thoughts.  Muscle relaxation. This involves tensing muscles then relaxing them.  Choose a stress reduction technique that fits your lifestyle and personality. Stress reduction techniques take time and practice. Set aside 5-15 minutes a day to do them. Therapists can offer training in these techniques. The training may be covered by some insurance plans. Other things you can do to manage stress include:  Keeping a stress diary. This can help you learn what triggers your stress and ways to control your response.  Thinking about how you respond to certain situations. You may not be able to control everything, but you can control your reaction.  Making time for activities that help you relax, and not feeling guilty about spending your time in this way.  Therapy combined with coping and stress-reduction skills provides the best chance for successful treatment. Medicines Medicines can help ease symptoms. Medicines for anxiety include:  Anti-anxiety drugs.  Antidepressants.  Beta-blockers.  Medicines may be used as the main treatment for anxiety disorder, along with therapy, or if other treatments are not working. Medicines should be prescribed by a health care provider. Relationships Relationships can play a big part in helping you recover. Try to spend more time connecting with trusted friends and family members. Consider going to couples counseling, taking family education classes, or going to family therapy. Therapy can help you and others better understand the condition. How to recognize changes in your condition Everyone has a different response to  treatment for anxiety. Recovery from anxiety happens when symptoms decrease and stop interfering with your daily activities at home or work. This may mean that you will start to:  Have better concentration and focus.  Sleep better.  Be less irritable.  Have more energy.  Have improved memory.  It is important to recognize when your condition is getting worse. Contact your health care provider if your symptoms interfere with home or work and you do not feel like your condition is improving. Where to find help and support: You can get help and support from these sources:  Self-help groups.  Online and [employer].  A trusted spiritual leader.  Couples counseling.  Family education classes.  Family therapy.  Follow these instructions at home:  Eat a healthy diet that includes plenty of vegetables, fruits, whole grains, low-fat dairy products, and lean protein. Do not eat a lot of foods that are high in solid fats, added sugars, or salt.  Exercise. Most adults should do the following: ? Exercise for at least 150 minutes each week. The exercise should increase your heart rate and make you sweat (moderate-intensity exercise). ? Strengthening exercises at least twice a week.  Cut down  on caffeine, tobacco, alcohol, and other potentially harmful substances.  Get the right amount and quality of sleep. Most adults need 7-9 hours of sleep each night.  Make choices that simplify your life.  Take over-the-counter and prescription medicines only as told by your health care provider.  Avoid caffeine, alcohol, and certain over-the-counter cold medicines. These may make you feel worse. Ask your pharmacist which medicines to avoid.  Keep all follow-up visits as told by your health care provider. This is important. Questions to ask your health care provider  Would I benefit from therapy?  How often should I follow up with a health care provider?  How long do I need to  take medicine?  Are there any long-term side effects of my medicine?  Are there any alternatives to taking medicine? Contact a health care provider if:  You have a hard time staying focused or finishing daily tasks.  You spend many hours a day feeling worried about everyday life.  You become exhausted by worry.  You start to have headaches, feel tense, or have nausea.  You urinate more than normal.  You have diarrhea. Get help right away if:  You have a racing heart and shortness of breath.  You have thoughts of hurting yourself or others. If you ever feel like you may hurt yourself or others, or have thoughts about taking your own life, get help right away. You can go to your nearest emergency department or call:  Your local emergency services (911 in the [place]  A suicide crisis helpline, such as the [place] at 469-370-3303. This is open 24-hours a day.  Summary  Taking steps to deal with stress can help calm you.  Medicines cannot cure anxiety disorders, but they can help ease symptoms.  Family, friends, and partners can play a big part in helping you recover from an anxiety disorder. This information is not intended to replace advice given to you by your health care provider. Make sure you discuss any questions you have with your health care provider. Document Released: 04/10/2016 Document Revised: 04/10/2016 Document Reviewed: 04/10/2016 Elsevier Interactive Patient Education  [employer].

## 2018-01-20 NOTE — Progress Notes (Signed)
Impression and Recommendations:    1. Mood disorder ([place])- mixed anxiety and depression   2. Generalized anxiety disorder   3. Panic attack   4. Primary insomnia   5. Essential hypertension   6. Obesity, Class I, BMI 30-34.9   7. Vitamin D deficiency   8. Hypokalemia   9. Elevated serum creatinine   10. Elevated ALT measurement   11. Mixed hyperlipidemia     Mood -increased celexa to 20mg   -Anxiety symptoms improving -Discussed using as little xanax as possible -Discussed possible negative impact of xanax on memory long term -Discussed possibility of increasing buspar to help impact anxiety -Discussed that the full of effect of buspar typically occurs in 8-12 weeks -Discussed best impact of celexa and buspar  Insomnia -Discussed positives and negatives of ambien use -Discussed Ambiens use as a short term solution to insomnia -Discussed legal requirements for continuing alprazolam and ambien use  HTN -Patient restarted triamterine-HCTZ on her own to manage bp; see med list below -Pt stopped losartan as she felt it was contributing to hives -Discussed use of losartan to help maintain potassium due to low leveles -Discussed healthy bp ranges -Discussed positive impact of weight loss on bp maintenance -Discussed tendency of potassium loss with bp medications  -Discussed importance of reducing stress and making healthy choices to decrease bp  BMI -Encouraged pt to continue making healthy choices -Discussed impact of snacking on weight gain  Follow up -Pt to return in 8 weeks to discuss medication increases, order blood work, and check weight loss progress    Education and routine counseling performed. Handouts provided.  Orders Placed This Encounter  Procedures  . Comprehensive metabolic panel    Medications Discontinued During This Encounter  Medication Reason  . ALPRAZolam (XANAX) 1 MG tablet Reorder  . citalopram (CELEXA) 10 MG tablet Reorder  .  Vitamin D, Ergocalciferol, (DRISDOL) 50000 units CAPS capsule Reorder  . triamterene-hydrochlorothiazide (MAXZIDE-25) 37.5-25 MG tablet Reorder  . zolpidem (AMBIEN CR) 12.5 MG CR tablet Reorder  . potassium chloride (K-DUR,KLOR-CON) 10 MEQ tablet Reorder  . aspirin EC 81 MG tablet Reorder  . atorvastatin (LIPITOR) 10 MG tablet Reorder     Meds ordered this encounter  Medications  . ALPRAZolam (XANAX) 1 MG tablet    Sig: Take 1 tablet (1 mg total) by mouth as needed (PANIC ONLY).    Dispense:  30 tablet    Refill:  0  . citalopram (CELEXA) 20 MG tablet    Sig: 20 mg PO qd    Dispense:  90 tablet    Refill:  1  . Vitamin D, Ergocalciferol, (DRISDOL) 50000 units CAPS capsule    Sig: Take 1 capsule (50,000 Units total) by mouth every 7 (seven) days.    Dispense:  12 capsule    Refill:  3  . triamterene-hydrochlorothiazide (MAXZIDE-25) 37.5-25 MG tablet    Sig: Take 1 tablet by mouth daily.    Dispense:  90 tablet    Refill:  1  . zolpidem (AMBIEN CR) 12.5 MG CR tablet    Sig: Take 1 tablet (12.5 mg total) by mouth at bedtime as needed for sleep.    Dispense:  90 tablet    Refill:  0  . potassium chloride (K-DUR,KLOR-CON) 10 MEQ tablet    Sig: Take 1 tablet (10 mEq total) by mouth daily.    Dispense:  90 tablet    Refill:  1  . aspirin EC 81 MG tablet  Sig: Take 1 tablet (81 mg total) by mouth daily.    Dispense:  90 tablet    Refill:  3  . atorvastatin (LIPITOR) 10 MG tablet    Sig: Take 1 tablet (10 mg total) by mouth Nightly.    Dispense:  90 tablet    Refill:  1    Gross side effects, risk and benefits, and alternatives of medications and treatment plan in general discussed with patient.  Patient is aware that all medications have potential side effects and we are unable to predict every side effect or drug-drug interaction that may occur.   Patient will call with any questions prior to using medication if they have concerns.  Expresses verbal understanding and consents  to current therapy and treatment regimen.  No barriers to understanding were identified.  Red flag symptoms and signs discussed in detail.  Patient expressed understanding regarding what to do in case of emergency\urgent symptoms  Please see AVS handed out to patient at the end of our visit for further patient instructions/ counseling done pertaining to today's office visit.   Return for cmp prior to next OV in 8 wks for f/up mood- (inc celexa).     Note:  This document was prepared using Dragon voice recognition software and may include unintentional dictation errors.     This document serves as a record of services personally performed by Mellody Dance, MD. It was created on her behalf by Georga Bora, a trained medical scribe. The creation of this record is based on the scribe's personal observations and the provider's statements to them.   I have reviewed the above medical documentation for accuracy and completeness and I concur.  Mellody Dance 01/21/18 7:48 AM   --------------------------------------------------------------------------------------------------------------------------------------------------------------------------------------------------------------------------------------------    Subjective:    CC:  Chief Complaint  Patient presents with  . Follow-up    HPI: Mercedes Webb is a 62 y.o. female who presents to [place] [facility] today for follow-up of mood.    Mood -Began Buspar, xanax and celexa during last appointment  -Pt states she is much better since last appointment -Pt is not experiencing any negative SE, or fatigue -States she no longer feels stressed all the time or "going into that place I don't need to be" -States she is still experiencing issues in her family and life but no longer feels like she has to carry all their problems -States she is using the xanax only when she absolutely can't get any other  relief -Has used roughly 32 since July  [place] she recently went to the beach and really got the opportunity to relax -States she was walking along the beach every day  Insomnia -Began Ambien last appointment  -States she is sleeping much better -Denies experiencing grogginess or hangover after ambien use  HTN -pt stopped taking losartan of own volition -Pt has been experiencing hives  -Discontinued progesterone; will see OBGYN for further management -States she read that losartan can cause hives and stopped taking it -States she has been using triamterene to help with hives and has noticed some improvement  -States her bp is usually around 140/80 or less -Pt has been taking "her old bp medication" instead of stopping altogether -States her bp has remained at normal levels in ambulatory testing  -States she knows that she needs bp medication and didn't want to go without  BMI -Pt has lost 10 lbs since her appointment in July  -States she hasn't been eating  out at all and believes that has positively impacted her weight    Depression screen [facility] 2/9 01/20/2018 11/11/2017 09/04/2017  Decreased Interest 1 2 0  Down, Depressed, Hopeless 1 1 0  PHQ - 2 Score 2 3 0  Altered sleeping 1 3 -  Tired, decreased energy 1 2 -  Change in appetite 1 2 -  Feeling bad or failure about yourself  1 0 -  Trouble concentrating 1 1 -  Moving slowly or fidgety/restless 0 0 -  Suicidal thoughts 0 0 -  PHQ-9 Score 7 11 -  Difficult doing work/chores Not difficult at all Not difficult at all -     GAD 7 : Generalized Anxiety Score 01/20/2018 05/28/2017 01/21/2017 11/26/2016  Nervous, Anxious, on Edge 1 1 3 1   Control/stop worrying 1 1 3 1   Worry too much - different things 1 1 3 1   Trouble relaxing 1 1 3 1   Restless 0 1 2 1   Easily annoyed or irritable 1 1 1 1   Afraid - awful might happen 0 1 0 0  Total GAD 7 Score 5 7 15 6   Anxiety Difficulty - Somewhat difficult Somewhat difficult  Somewhat difficult     Wt Readings from Last 3 Encounters:  01/20/18 191 lb 13.6 oz (87 kg)  11/11/17 201 lb (91.2 kg)  09/04/17 196 lb 8 oz (89.1 kg)   BP Readings from Last 3 Encounters:  01/20/18 138/84  11/11/17 128/81  09/04/17 (!) 143/83   Pulse Readings from Last 3 Encounters:  01/20/18 67  11/11/17 81  09/04/17 87   BMI Readings from Last 3 Encounters:  01/20/18 30.05 kg/m  11/11/17 31.48 kg/m  09/04/17 30.78 kg/m         Patient Care Team    Relationship Specialty Notifications Start End  Mellody Dance, DO PCP - General Family Medicine  11/26/16   Defrancesco, Alanda Slim, MD Consulting Physician Obstetrics and Gynecology  11/12/16   Joylene Igo, CNM Midwife Obstetrics and Gynecology  11/12/16   Danella Sensing, MD Consulting Physician Dermatology  11/12/16   Richmond Campbell, MD Consulting Physician Gastroenterology  11/26/16   Carol Ada, MD Consulting Physician Gastroenterology  11/26/16   Keene Breath., MD  Ophthalmology  11/26/16   Joylene Igo, CNM Midwife Obstetrics and Gynecology  11/26/16    Comment: Prescribes her Seroquel     Patient Active Problem List   Diagnosis Date Noted  . Obesity, Class I, BMI 30-34.9 06/10/2017    Priority: High  . Primary insomnia 11/12/2016    Priority: High  . Adjustment disorder with mixed anxiety and depressed mood 11/12/2016    Priority: High  . Elevated cholesterol with elevated triglycerides 09/28/2016    Priority: High  . Essential hypertension 01/31/2016    Priority: High  . Generalized anxiety disorder 01/31/2016    Priority: High  . Postmenopausal- 30 yrs ago TAH "yrs ago" 11/12/2016    Priority: Medium  . Hormone imbalance- txed by gyn 11/12/2016    Priority: Medium  . Mixed hyperlipidemia 11/12/2016    Priority: Medium  . Hypokalemia 02/01/2016    Priority: Medium  . Vitamin D deficiency 09/28/2016    Priority: Low  . Status post abdominal hysterectomy 12/23/2014    Priority: Low   . Mood disorder ([place])- mixed anxiety and depression 01/20/2018  . Panic attack 01/20/2018  . Excessive sweating- likely due to hormonal 11/11/2017  . Inactivity 11/11/2017  . Elevated serum creatinine 09/04/2017  . Elevated  ALT measurement 09/04/2017  . Acute biliary pancreatitis 01/31/2016  . Overweight 12/23/2014  . Surgical menopause 12/23/2014  . History of cervical dysplasia 12/23/2014  . SUI (stress urinary incontinence, female) 12/23/2014    Past Medical history, Surgical history, Family history, Social history, Allergies and Medications have been entered into the medical record, reviewed and changed as needed.    Current Meds  Medication Sig  . ALPRAZolam (XANAX) 1 MG tablet Take 1 tablet (1 mg total) by mouth as needed (PANIC ONLY).  . Ascorbic Acid (VITAMIN C) 1000 MG tablet Take 1,000 mg by mouth daily.  Marland Kitchen aspirin EC 81 MG tablet Take 1 tablet (81 mg total) by mouth daily.  Marland Kitchen atorvastatin (LIPITOR) 10 MG tablet Take 1 tablet (10 mg total) by mouth Nightly.  . busPIRone (BUSPAR) 10 MG tablet Take 1 tablet (10 mg total) by mouth 3 (three) times daily.  . Calcium Carbonate (CALTRATE 600 PO) Take 1 tablet by mouth daily.  . citalopram (CELEXA) 20 MG tablet 20 mg PO qd  . estradiol (ESTRACE) 0.5 MG tablet TAKE 1 TABLET BY MOUTH  DAILY  . Melatonin (MELATONIN MAXIMUM STRENGTH) 5 MG TABS Take 5 mg by mouth at bedtime as needed (sleep).   . Multiple Vitamins-Minerals (CENTRUM SILVER PO) Take 1 tablet by mouth daily.  . Omega-3 Fatty Acids (FISH OIL) 1000 MG CAPS Take 1 capsule by mouth daily.  . progesterone (PROMETRIUM) 200 MG capsule TAKE 1 CAPSULE BY MOUTH  DAILY  . triamterene-hydrochlorothiazide (MAXZIDE-25) 37.5-25 MG tablet Take 1 tablet by mouth daily.  . valACYclovir (VALTREX) 500 MG tablet Take 1 tablet (500 mg total) by mouth daily.  . Vitamin D, Ergocalciferol, (DRISDOL) 50000 units CAPS capsule Take 1 capsule (50,000 Units total) by mouth every 7 (seven) days.  Marland Kitchen  zolpidem (AMBIEN CR) 12.5 MG CR tablet Take 1 tablet (12.5 mg total) by mouth at bedtime as needed for sleep.  . [DISCONTINUED] ALPRAZolam (XANAX) 1 MG tablet Take 1 tablet (1 mg total) by mouth 3 (three) times daily as needed for anxiety.  . [DISCONTINUED] aspirin EC 81 MG tablet Take 1 tablet (81 mg total) by mouth daily.  . [DISCONTINUED] atorvastatin (LIPITOR) 10 MG tablet Take 1 tablet (10 mg total) by mouth Nightly.  . [DISCONTINUED] citalopram (CELEXA) 10 MG tablet 5 mg daily x2 weeks then increase to 1 tab daily  . [DISCONTINUED] triamterene-hydrochlorothiazide (MAXZIDE-25) 37.5-25 MG tablet Take 1 tablet by mouth daily.  . [DISCONTINUED] Vitamin D, Ergocalciferol, (DRISDOL) 50000 units CAPS capsule Take 1 capsule (50,000 Units total) by mouth every 7 (seven) days.  . [DISCONTINUED] zolpidem (AMBIEN CR) 12.5 MG CR tablet Take 1 tablet (12.5 mg total) by mouth at bedtime as needed for sleep.    Allergies:  Allergies  Allergen Reactions  . Prozac [Fluoxetine Hcl] Other (See Comments)    sweating     Review of Systems: Review of Systems: General:   No F/C, wt loss Pulm:   No DIB, SOB, pleuritic chest pain Card:  No CP, palpitations Abd:  No n/v/d or pain Ext:  No inc edema from baseline Psych: no SI/ HI    Objective:   Blood pressure 138/84, pulse 67, height 5\' 7"  (1.702 m), weight 191 lb 13.6 oz (87 kg), SpO2 98 %. Body mass index is 30.05 kg/m. General:  Well Developed, well nourished, appropriate for stated age.  Neuro:  Alert and oriented,  extra-ocular muscles intact  HEENT:  Normocephalic, atraumatic, neck supple, no carotid bruits appreciated  Skin:  no gross rash, warm, pink. Cardiac:  RRR, S1 S2 Respiratory:  ECTA B/L and A/P, Not using accessory muscles, speaking in full sentences- unlabored. Vascular:  Ext warm, no cyanosis apprec.; cap RF less 2 sec. Psych:  No HI/SI, judgement and insight good, Euthymic mood. Full Affect.

## 2018-02-16 ENCOUNTER — Other Ambulatory Visit: Payer: Self-pay | Admitting: Family Medicine

## 2018-03-05 ENCOUNTER — Ambulatory Visit (INDEPENDENT_AMBULATORY_CARE_PROVIDER_SITE_OTHER): Payer: BLUE CROSS/BLUE SHIELD

## 2018-03-05 DIAGNOSIS — Z23 Encounter for immunization: Secondary | ICD-10-CM | POA: Diagnosis not present

## 2018-03-05 NOTE — Progress Notes (Signed)
Patient came in for flu vaccine.  Patient tolerated injection well.

## 2018-03-11 ENCOUNTER — Telehealth: Payer: Self-pay | Admitting: Family Medicine

## 2018-03-11 NOTE — Telephone Encounter (Signed)
Noted MPulliam, CMA/RT(R)  

## 2018-03-11 NOTE — Telephone Encounter (Signed)
Patient called and cancelled upcoming lab and OV with Dr. Jenetta Downer, she wanted to send a message stating that meds and everything is going well and with this time of the year it hard for her to get in for an OV. She states she will f/u at first of the year

## 2018-03-21 ENCOUNTER — Other Ambulatory Visit: Payer: 59

## 2018-03-21 ENCOUNTER — Other Ambulatory Visit: Payer: Self-pay | Admitting: Family Medicine

## 2018-03-24 ENCOUNTER — Ambulatory Visit: Payer: 59 | Admitting: Family Medicine

## 2018-03-24 ENCOUNTER — Telehealth: Payer: Self-pay

## 2018-03-24 NOTE — Telephone Encounter (Signed)
Please call pt and have her schedule follow up OV with Dr. Raliegh Scarlet.  No further refills until pt is seen.  [name], CMA

## 2018-04-24 ENCOUNTER — Encounter: Payer: Self-pay | Admitting: Family Medicine

## 2018-04-24 ENCOUNTER — Ambulatory Visit (INDEPENDENT_AMBULATORY_CARE_PROVIDER_SITE_OTHER): Payer: BLUE CROSS/BLUE SHIELD | Admitting: Family Medicine

## 2018-04-24 VITALS — BP 128/76 | HR 82 | Temp 98.6°F | Ht 67.0 in | Wt 208.0 lb

## 2018-04-24 DIAGNOSIS — B009 Herpesviral infection, unspecified: Secondary | ICD-10-CM

## 2018-04-24 DIAGNOSIS — E349 Endocrine disorder, unspecified: Secondary | ICD-10-CM

## 2018-04-24 DIAGNOSIS — F39 Unspecified mood [affective] disorder: Secondary | ICD-10-CM | POA: Diagnosis not present

## 2018-04-24 DIAGNOSIS — E782 Mixed hyperlipidemia: Secondary | ICD-10-CM

## 2018-04-24 DIAGNOSIS — F410 Panic disorder [episodic paroxysmal anxiety] without agoraphobia: Secondary | ICD-10-CM | POA: Diagnosis not present

## 2018-04-24 DIAGNOSIS — F5101 Primary insomnia: Secondary | ICD-10-CM | POA: Diagnosis not present

## 2018-04-24 DIAGNOSIS — F411 Generalized anxiety disorder: Secondary | ICD-10-CM

## 2018-04-24 DIAGNOSIS — E66811 Obesity, class 1: Secondary | ICD-10-CM

## 2018-04-24 DIAGNOSIS — R740 Nonspecific elevation of levels of transaminase and lactic acid dehydrogenase [LDH]: Secondary | ICD-10-CM

## 2018-04-24 DIAGNOSIS — E669 Obesity, unspecified: Secondary | ICD-10-CM

## 2018-04-24 DIAGNOSIS — R7401 Elevation of levels of liver transaminase levels: Secondary | ICD-10-CM

## 2018-04-24 DIAGNOSIS — I10 Essential (primary) hypertension: Secondary | ICD-10-CM

## 2018-04-24 MED ORDER — VALACYCLOVIR HCL 500 MG PO TABS: 500.0000 mg | ORAL_TABLET | Freq: Every day | ORAL | 1 refills | Status: DC

## 2018-04-24 MED ORDER — ZOLPIDEM TARTRATE ER 6.25 MG PO TBCR: 6.2500 mg | EXTENDED_RELEASE_TABLET | Freq: Every evening | ORAL | 0 refills | Status: DC | PRN

## 2018-04-24 MED ORDER — OMEGA-3-ACID ETHYL ESTERS 1 G PO CAPS: 2.0000 g | ORAL_CAPSULE | Freq: Two times a day (BID) | ORAL | 3 refills | Status: DC

## 2018-04-24 MED ORDER — ATORVASTATIN CALCIUM 10 MG PO TABS: 10.0000 mg | ORAL_TABLET | Freq: Every day | ORAL | 1 refills | Status: DC

## 2018-04-24 MED ORDER — TRIAMTERENE-HCTZ 37.5-25 MG PO TABS: 1.0000 | ORAL_TABLET | Freq: Every day | ORAL | 1 refills | Status: DC

## 2018-04-24 NOTE — Progress Notes (Signed)
Impression and Recommendations:    1. Mood disorder ([place])- mixed anxiety and depression   2. Generalized anxiety disorder   3. Panic attack   4. Primary insomnia   5. Essential hypertension   6. Obesity, Class I, BMI 30-34.9   7. Hormone imbalance- txed by gyn   8. Mixed hyperlipidemia   9. HSV (herpes simplex virus) infection   10. Elevated ALT measurement     - Med list reviewed in office today with patient at length.  Mood Management - Mixed Anxiety & Depression - Symptoms remain controlled at this time. - Continue treatment plan as prescribed.  See med list below. - Pt tolerating meds well without complication.  - Per pt, reports success managing mood with extra Buspar PRN.  Per pt, she has not taken the xanax at all since her last visit.  Discussed the critical importance of continued reduction of xanax use.  - Reviewed the "spokes of the wheel" of mood and health management.  Stressed the importance of ongoing prudent habits, including regular exercise, appropriate sleep hygiene, healthful dietary habits, and prayer/meditation to relax.  - Will continue to monitor.  Sleep - Primary Insomnia - Managed on Ambien.  Dose decreased to 6.25 mg sustained release. - Pt is able to sleep well on management with Ambien. - Continue treatment plan as prescribed.  See med list below. - Patient tolerating meds well without complication.  Denies S-E  - Discussed importance of attempting to discontinue use of sleep aid Ambien. - Will continue to monitor.  Cholesterol Management - Mixed Hyperlipidemia - Start Lovaza fish oil.  Prescription provided today. - Otherwise, continue management as prescribed.  See med list today. - Will continue to monitor.  Supplementation - Continue supplementation as prescribed; Vitamin D, Calcium, Potassium, Multivitamin.  See med list. - Will continue to monitor.  BMI Counseling - Obesity Explained to patient what BMI refers to, and what it  means medically.    Told patient to think about it as a "medical risk stratification measurement" and how increasing BMI is associated with increasing risk/ or worsening state of various diseases such as hypertension, hyperlipidemia, diabetes, premature OA, depression etc.  American Heart Association guidelines for healthy diet, basically Mediterranean diet, and exercise guidelines of 30 minutes 5 days per week or more discussed in detail.  Health counseling performed.  All questions answered.  Lifestyle & Preventative Health Maintenance - Advised patient to continue working toward exercising to improve overall mental, physical, and emotional health.    - Encouraged patient to engage in daily physical activity, especially a formal exercise routine.  Recommended that the patient eventually strive for at least 150 minutes of moderate cardiovascular activity per week according to guidelines established by the [facility].   - Healthy dietary habits encouraged, including low-carb, and high amounts of lean protein in diet.   - Patient should also consume adequate amounts of water.   Orders Placed This Encounter  Procedures  . Comprehensive metabolic panel    Meds ordered this encounter  Medications  . valACYclovir (VALTREX) 500 MG tablet    Sig: Take 1 tablet (500 mg total) by mouth daily.    Dispense:  90 tablet    Refill:  1  . omega-3 acid ethyl esters (LOVAZA) 1 g capsule    Sig: Take 2 capsules (2 g total) by mouth 2 (two) times daily.    Dispense:  180 capsule    Refill:  3  . triamterene-hydrochlorothiazide (MAXZIDE-25)  37.5-25 MG tablet    Sig: Take 1 tablet by mouth daily.    Dispense:  90 tablet    Refill:  1  . zolpidem (AMBIEN CR) 6.25 MG CR tablet    Sig: Take 1 tablet (6.25 mg total) by mouth at bedtime as needed for sleep.    Dispense:  90 tablet    Refill:  0    Medications Discontinued During This Encounter  Medication Reason  . valACYclovir (VALTREX) 500 MG tablet  Reorder  . triamterene-hydrochlorothiazide (MAXZIDE-25) 37.5-25 MG tablet Reorder  . zolpidem (AMBIEN CR) 12.5 MG CR tablet   . losartan-hydrochlorothiazide (HYZAAR) 100-12.5 MG tablet Change in therapy  . Melatonin (MELATONIN MAXIMUM STRENGTH) 5 MG TABS Change in therapy  . Omega-3 Fatty Acids (FISH OIL) 1000 MG CAPS Change in therapy  . progesterone (PROMETRIUM) 200 MG capsule      Gross side effects, risk and benefits, and alternatives of medications and treatment plan in general discussed with patient.  Patient is aware that all medications have potential side effects and we are unable to predict every side effect or drug-drug interaction that may occur.   Patient will call with any questions prior to using medication if they have concerns.    Expresses verbal understanding and consents to current therapy and treatment regimen.  No barriers to understanding were identified.  Red flag symptoms and signs discussed in detail.  Patient expressed understanding regarding what to do in case of emergency\urgent symptoms  Please see AVS handed out to patient at the end of our visit for further patient instructions/ counseling done pertaining to today's office visit.   Return in about 4 months (around 08/24/2018) for Hypertension follow up every 4 mo.     Note:  This note was prepared with assistance of Dragon voice recognition software. Occasional wrong-word or sound-a-like substitutions may have occurred due to the inherent limitations of voice recognition software.   This document serves as a record of services personally performed by Mellody Dance, DO. It was created on her behalf by [name], a trained medical scribe. The creation of this record is based on the scribe's personal observations and the provider's statements to them.   I have reviewed the above medical documentation for accuracy and completeness and I concur.  Mellody Dance, DO 04/27/2018 7:20  PM        ---------------------------------------------------------------------------------------------------------------------------------------    Subjective:     HPI: Mercedes Webb is a 62 y.o. female who presents to [facility] today for issues as discussed below.  Continues on Calcium and Vitamin D supplementation, along with fish oil and multivitamins.  Patient notes she's been "eating like a pig" with the holidays.  Mood & Sleep Notes that she is sleeping well.  She continues on management as prescribed, and specifically mentions that she had to make changes in her sleep patterns "that I didn't know I needed to make."  Has to take her night medicine at a certain time, has to sleep in pitch black and chill down for a certain amount of time before sleeping.  She is not taking melatonin to help with sleep.  Feels that the change to citalopram has "been the kicker."  Notes she hasn't had a xanax since she was here last.    Notes she is doing well on the Buspar; takes it 2-3 times per day, but occasionally 4 PRN.     Wt Readings from Last 3 Encounters:  04/24/18 208  lb (94.3 kg)  01/20/18 191 lb 13.6 oz (87 kg)  11/11/17 201 lb (91.2 kg)   BP Readings from Last 3 Encounters:  04/24/18 128/76  01/20/18 138/84  11/11/17 128/81   Pulse Readings from Last 3 Encounters:  04/24/18 82  01/20/18 67  11/11/17 81   BMI Readings from Last 3 Encounters:  04/24/18 32.58 kg/m  01/20/18 30.05 kg/m  11/11/17 31.48 kg/m     Patient Care Team    Relationship Specialty Notifications Start End  Mellody Dance, DO PCP - General Family Medicine  11/26/16   Defrancesco, Alanda Slim, MD Consulting Physician Obstetrics and Gynecology  11/12/16   Joylene Igo, CNM Midwife Obstetrics and Gynecology  11/12/16   Danella Sensing, MD Consulting Physician Dermatology  11/12/16   Richmond Campbell, MD Consulting Physician Gastroenterology  11/26/16   Carol Ada, MD Consulting Physician Gastroenterology  11/26/16   Keene Breath., MD  Ophthalmology  11/26/16   Joylene Igo, CNM Midwife Obstetrics and Gynecology  11/26/16    Comment: Prescribes her Seroquel     Patient Active Problem List   Diagnosis Date Noted  . Mood disorder ([place])- mixed anxiety and depression 01/20/2018  . Panic attack 01/20/2018  . Excessive sweating- likely due to hormonal 11/11/2017  . Inactivity 11/11/2017  . Elevated serum creatinine 09/04/2017  . Elevated ALT measurement 09/04/2017  . Obesity, Class I, BMI 30-34.9 06/10/2017  . Postmenopausal- 30 yrs ago TAH "yrs ago" 11/12/2016  . Hormone imbalance- txed by gyn 11/12/2016  . Mixed hyperlipidemia 11/12/2016  . Primary insomnia 11/12/2016  . Adjustment disorder with mixed anxiety and depressed mood 11/12/2016  . Elevated cholesterol with elevated triglycerides 09/28/2016  . Vitamin D deficiency 09/28/2016  . Hypokalemia 02/01/2016  . Acute biliary pancreatitis 01/31/2016  . Essential hypertension 01/31/2016  . Generalized anxiety disorder 01/31/2016  . Overweight 12/23/2014  . Surgical menopause 12/23/2014  . Status post abdominal hysterectomy 12/23/2014  . History of cervical dysplasia 12/23/2014  . SUI (stress urinary incontinence, female) 12/23/2014    Past Medical history, Surgical history, Family history, Social history, Allergies and Medications have been entered into the medical record, reviewed and changed as needed.    Current Meds  Medication Sig  . ALPRAZolam (XANAX) 1 MG tablet Take 1 tablet (1 mg total) by mouth as needed (PANIC ONLY).  . Ascorbic Acid (VITAMIN C) 1000 MG tablet Take 1,000 mg by mouth daily.  Marland Kitchen aspirin EC 81 MG tablet Take 1 tablet (81 mg total) by mouth daily.  Marland Kitchen atorvastatin (LIPITOR) 10 MG tablet TAKE 1 TABLET BY MOUTH  NIGHTLY  . busPIRone (BUSPAR) 10 MG tablet Take 1 tablet (10 mg total) by mouth 3 (three) times daily.  . Calcium Carbonate (CALTRATE 600 PO)  Take 1 tablet by mouth daily.  . citalopram (CELEXA) 20 MG tablet 20 mg PO qd  . estradiol (ESTRACE) 0.5 MG tablet TAKE 1 TABLET BY MOUTH  DAILY  . Multiple Vitamins-Minerals (CENTRUM SILVER PO) Take 1 tablet by mouth daily.  . potassium chloride (K-DUR,KLOR-CON) 10 MEQ tablet Take 1 tablet (10 mEq total) by mouth daily.  Marland Kitchen triamterene-hydrochlorothiazide (MAXZIDE-25) 37.5-25 MG tablet Take 1 tablet by mouth daily.  . valACYclovir (VALTREX) 500 MG tablet Take 1 tablet (500 mg total) by mouth daily.  . Vitamin D, Ergocalciferol, (DRISDOL) 50000 units CAPS capsule Take 1 capsule (50,000 Units total) by mouth every 7 (seven) days.  Marland Kitchen zolpidem (AMBIEN CR) 6.25 MG CR tablet Take 1 tablet (6.25 mg  total) by mouth at bedtime as needed for sleep.  . [DISCONTINUED] triamterene-hydrochlorothiazide (MAXZIDE-25) 37.5-25 MG tablet Take 1 tablet by mouth daily.  . [DISCONTINUED] valACYclovir (VALTREX) 500 MG tablet Take 1 tablet (500 mg total) by mouth daily. PATIENT MUST HAVE OFFICE VISIT PRIOR TO ANY FURTHER REFILLS  . [DISCONTINUED] zolpidem (AMBIEN CR) 12.5 MG CR tablet Take 1 tablet (12.5 mg total) by mouth at bedtime as needed for sleep.    Allergies:  Allergies  Allergen Reactions  . Prozac [Fluoxetine Hcl] Other (See Comments)    sweating     Review of Systems:  A fourteen system review of systems was performed and found to be positive as per HPI.   Objective:   Blood pressure 128/76, pulse 82, temperature 98.6 F (37 C), height 5\' 7"  (1.702 m), weight 208 lb (94.3 kg), SpO2 97 %. Body mass index is 32.58 kg/m. General:  Well Developed, well nourished, appropriate for stated age.  Neuro:  Alert and oriented,  extra-ocular muscles intact  HEENT:  Normocephalic, atraumatic, neck supple, no carotid bruits appreciated  Skin:  no gross rash, warm, pink. Cardiac:  RRR, S1 S2 Respiratory:  ECTA B/L and A/P, Not using accessory muscles, speaking in full sentences- unlabored. Vascular:  Ext  warm, no cyanosis apprec.; cap RF less 2 sec. Psych:  No HI/SI, judgement and insight good, Euthymic mood. Full Affect.

## 2018-04-24 NOTE — Patient Instructions (Addendum)
PLEASE NOTE:  Thank you for your time and patience today.  I hope you found your office visit with me encouraging and educational, as my goal is to help my patients become healthier and happier.  We will call or contact you via MyChart in the next couple of days, if labs are critically abnormal and need to be addressed immediately.  Otherwise you will usually hear from [place] within 1 week or so via phone call or MyChart.  Lastly, please note, if you have an active MyChart account, even if you are not using it and checking it regularly, this is how we will be contacting you regarding lab results, test results etc. So please remember to check your account 1 to 2 weeks after blood work or tests that we ordered is completed.    If you do not want to be emailed results and wish to be called instead, you will need to cancel the MyChart account.   Tyler at our front desk can do that for you.  If you need medication refills, please be sure to call your pharmacy to request them.   This is the fastest and easiest way for you to obtain refills.   However, if a refill is not appropriate, one of our staff members or the pharmacy should contact you to let you know why it was denied.  Also, once you receive your after visit summary at the end of your appointment, please CAREFULLY and THOROUGHLY read your current medication list.    If you find inaccurate information, or if you take your medications differently than what is documented (even over-the-counter ones) please be sure to call the office and speak to one of our medical assistants about it so this can be corrected in your chart.    Please note that you may receive a survey via email or snail mail about your experience today.  Please feel free to be open and honest about your experiences with [place], as your input is valuable to [place] and will help [place] to improve our care and services.  Otherwise, please feel free to do a google review on [place] or go to  [email or web address] and click on the FACEBOOK link to leave your thoughts.  If you have any further questions or concerns please do not hesitate to contact us.    Thank you and we appreciate you choosing [place] as your primary care provider!    - Dr. Raliegh Scarlet and 'the Team' at [facility]  Weight management involves adopting a healthy lifestyle that includes a knowledge of nutrition and exercise, a positive attitude and the right kind of motivation. Internal motives such as better health, increased energy, self-esteem and personal control increase your chances of lifelong weight management success.  Remember to have realistic goals and think long-term success. Believe in yourself and you can do it. The following information will give you ideas to help you meet your goals.  Control Your Home Environment  Eat only while sitting down at the kitchen or dining room table. Do not eat while watching television, reading, cooking, talking on the phone, standing at the refrigerator or working on the computer. Keep tempting foods out of the house -- don't buy them. Keep tempting foods out of sight. Have low-calorie foods ready to eat. Unless you are preparing a meal, stay out of the kitchen. Have healthy snacks at your disposal, such as small pieces of  fruit, vegetables, canned fruit, pretzels, low-fat string cheese and nonfat cottage cheese.  Control Your Work Environment  Do not eat at [employer] or keep tempting snacks at your desk. If you get hungry between meals, plan healthy snacks and bring them with you to work. During your breaks, go for a walk instead of eating. If you work around food, plan in advance the one item you will eat at mealtime. Make it inconvenient to nibble on food by chewing gum, sugarless candy or drinking water or another low-calorie beverage. Do not work through meals. Skipping  meals slows down metabolism and may result in overeating at the next meal. If food is available for special occasions, either pick the healthiest item, nibble on low-fat snacks brought from home, don't have anything offered, choose one option and have a small amount, or have only a beverage.  Control Your Mealtime Environment  Serve your plate of food at the stove or kitchen counter. Do not put the serving dishes on the table. If you do put dishes on the table, remove them immediately when finished eating. Fill half of your plate with vegetables, a quarter with lean protein and a quarter with starch. Use smaller plates, bowls and glasses. A smaller portion will look large when it is in a little dish. Politely refuse second helpings. When fixing your plate, limit portions of food to one scoop/serving or less.   Daily Food Management  Replace eating with another activity that you will not associate with food. Wait 20 minutes before eating something you are craving. Drink a large glass of water or diet soda before eating. Always have a big glass or bottle of water to drink throughout the day. Avoid high-calorie add-ons such as cream with your coffee, butter, mayonnaise and salad dressings.  Shopping: Do not shop when hungry or tired. Shop from a list and avoid buying anything that is not on your list. If you must have tempting foods, buy individual-sized packages and try to find a lower-calorie alternative. Don't taste test in the store. Read food labels. Compare products to help you make the healthiest choices.  Preparation: Chew a piece of gum while cooking meals. Use a quarter teaspoon if you taste test your food. Try to only fix what you are going to eat, leaving yourself no chance for seconds. If you have prepared more food than you need, portion it into individual containers and freeze or refrigerate immediately. Don't snack while cooking meals.  Eating: Eat slowly. Remember it  takes about 20 minutes for your stomach to send a message to your brain that it is full. Don't let fake hunger make you think you need more. The ideal way to eat is to take a bite, put your utensil down, take a sip of water, cut your next bite, take a bit, put your utensil down and so on. Do not cut your food all at one time. Cut only as needed. Take small bites and chew your food well. Stop eating for a minute or two at least once during a meal or snack. Take breaks to reflect and have conversation.  Cleanup and Leftovers: Label leftovers for a specific meal or snack. Freeze or refrigerate individual portions of leftovers. Do not clean up if you are still hungry.  Eating Out and Social Eating  Do not arrive hungry. Eat something light before the meal. Try to fill up on low-calorie foods, such as vegetables and fruit, and eat smaller portions of the high-calorie foods.  Eat foods that you like, but choose small portions. If you want seconds, wait at least 20 minutes after you have eaten to see if you are actually hungry or if your eyes are bigger than your stomach. Limit alcoholic beverages. Try a soda water with a twist of lime. Do not skip other meals in the day to save room for the special event.  At Restaurants: Order  la carte rather than buffet style. Order some vegetables or a salad for an appetizer instead of eating bread. If you order a high-calorie dish, share it with someone. Try an after-dinner mint with your coffee. If you do have dessert, share it with two or more people. Don't overeat because you do not want to waste food. Ask for a doggie bag to take extra food home. Tell the server to put half of your entree in a to go bag before the meal is served to you. Ask for salad dressing, gravy or high-fat sauces on the side. Dip the tip of your fork in the dressing before each bite. If bread is served, ask for only one piece. Try it plain without butter or oil. At [employer] where oil and vinegar is served with bread, use only a small amount of oil and a lot of vinegar for dipping.  At a Friend's House: Offer to bring a dish, appetizer or dessert that is low in calories. Serve yourself small portions or tell the host that you only want a small amount. Stand or sit away from the snack table. Stay away from the kitchen or stay busy if you are near the food. Limit your alcohol intake.  At [employer] and Cafeterias: Cover most of your plate with lettuce and/or vegetables. Use a salad plate instead of a dinner plate. After eating, clear away your dishes before having coffee or tea.  Entertaining at Home: Explore low-fat, low-cholesterol cookbooks. Use single-serving foods like chicken breasts or hamburger patties. Prepare low-calorie appetizers and desserts.   Holidays: Keep tempting foods out of sight. Decorate the house without using food. Have low-calorie beverages and foods on hand for guests. Allow yourself one planned treat a day. Don't skip meals to save up for the holiday feast. Eat regular, planned meals.   Exercise Well  Make exercise a priority and a planned activity in the day. If possible, walk the entire or part of the distance to work. Get an exercise buddy. Go for a walk with a colleague during one of your breaks, go to the gym, run or take a walk with a friend, walk in the mall with a shopping companion. Park at the end of the parking lot and walk to the store or office entrance. Always take the stairs all of the way or at least part of the way to your floor. If you have a desk job, walk around the office frequently. Do leg lifts while sitting at your desk. Do something outside on the weekends like going for a hike or a bike ride.   Have a Healthy Attitude  Make health your weight management priority. Be realistic. Have a goal to achieve a healthier you, not necessarily the lowest weight or ideal weight based on calculations  or tables. Focus on a healthy eating style, not on dieting. Dieting usually lasts for a short amount of time and rarely produces long-term success. Think long term. You are developing new healthy behaviors to follow next month, in a year and in a decade.    This  information is for educational purposes only and is not intended to replace the advice of your doctor or health care provider. We encourage you to discuss with your doctor any questions or concerns you may have.        Guidelines for Losing Weight   We want weight loss that will last so you should lose 1-2 pounds a week.  THAT IS IT! Please pick THREE things a month to change. Once it is a habit check off the item. Then pick another three items off the list to become habits.  If you are already doing a habit on the list GREAT!  Cross that item off!  Dont drink your calories. Ie, alcohol, soda, fruit juice, and sweet tea.   Drink more water. Drink a glass when you feel hungry or before each meal.   Eat breakfast - Complex carb and protein (likeDannon light and fit yogurt, oatmeal, fruit, eggs, [place] bacon).  Measure your cereal.  Eat no more than one cup a day. (ie Kashi)  Eat an apple a day.  Add a vegetable a day.  Try a new vegetable a month.  Use Pam! Stop using oil or butter to cook.  Dont finish your plate or use smaller plates.  Share your dessert.  Eat sugar free Jello for dessert or frozen grapes.  Dont eat 2-3 hours before bed.  Switch to whole wheat bread, pasta, and brown rice.  Make healthier choices when you eat out. No fries!  Pick baked chicken, NOT fried.  Dont forget to SLOW DOWN when you eat. It is not going anywhere.   Take the stairs.  Park far away in the parking lot  Lift soup cans (or weights) for 10 minutes while watching TV.  Walk at work for 10 minutes during break.  Walk outside 1 time a week with your friend, kids, dog, or significant other.  Start a walking group at  church.  Walk the mall as much as you can tolerate.   Keep a food diary.  Weigh yourself daily.  Walk for 15 minutes 3 days per week.  Cook at home more often and eat out less. If life happens and you go back to old habits, it is okay.  Just start over. You can do it!  If you experience chest pain, get short of breath, or tired during the exercise, please stop immediately and inform your doctor.    Before you even begin to attack a weight-loss plan, it pays to remember this: You are not fat. You have fat. Losing weight isn't about blame or shame; it's simply another achievement to accomplish. Dieting is like any other skill--you have to buckle down and work at it. As long as you act in a smart, reasonable way, you'll ultimately get where you want to be. Here are some weight loss pearls for you.   1. It's Not a Diet. It's a Lifestyle Thinking of a diet as something you're on and suffering through only for the short term doesn't work. To shed weight and keep it off, you need to make permanent changes to the way you eat. It's OK to indulge occasionally, of course, but if you cut calories temporarily and then revert to your old way of eating, you'll gain back the weight quicker than you can say yo-yo. Use it to lose it. Research shows that one of the best predictors of long-term weight loss is how many pounds you drop in the first month. For that reason,  nutritionists often suggest being stricter for the first two weeks of your new eating strategy to build momentum. Cut out added sugar and alcohol and avoid unrefined carbs. After that, figure out how you can reincorporate them in a way that's healthy and maintainable.  2. There's a Right Way to Exercise Working out burns calories and fat and boosts your metabolism by building muscle. But those trying to lose weight are notorious for overestimating the number of calories they burn and underestimating the amount they take in. Unfortunately, your  system is biologically programmed to hold on to extra pounds and that means when you start exercising, your body senses the deficit and ramps up its hunger signals. If you're not diligent, you'll eat everything you burn and then some. Use it, to lose it. Cardio gets all the exercise glory, but strength and interval training are the real heroes. They help you build lean muscle, which in turn increases your metabolism and calorie-burning ability 3. Don't Overreact to Mild Hunger Some people have a hard time losing weight because of hunger anxiety. To them, being hungry is bad--something to be avoided at all costs--so they carry snacks with them and eat when they don't need to. Others eat because they're stressed out or bored. While you never want to get to the point of being ravenous (that's when bingeing is likely to happen), a hunger pang, a craving, or the fact that it's 3:00 p.m. should not send you racing for the vending machine or obsessing about the energy bar in your purse. Ideally, you should put off eating until your stomach is growling and it's difficult to concentrate.  Use it to lose it. When you feel the urge to eat, use the HALT method. Ask yourself, Am I really hungry? Or am I angry or anxious, lonely or bored, or tired? If you're still not certain, try the apple test. If you're truly hungry, an apple should seem delicious; if it doesn't, something else is going on. Or you can try drinking water and making yourself busy, if you are still hungry try a healthy snack.  4. Not All Calories Are Created Equal The mechanics of weight loss are pretty simple: Take in fewer calories than you use for energy. But the kind of food you eat makes all the difference. Processed food that's high in saturated fat and refined starch or sugar can cause inflammation that disrupts the hormone signals that tell your brain you're full. The result: You eat a lot more.  Use it to lose it. Clean up your diet. Swap in  whole, unprocessed foods, including vegetables, lean protein, and healthy fats that will fill you up and give you the biggest nutritional bang for your calorie buck. In a few weeks, as your brain starts receiving regular hunger and fullness signals once again, you'll notice that you feel less hungry overall and naturally start cutting back on the amount you eat.  5. Protein, Produce, and Plant-Based Fats Are Your Weight-Loss Trinity Here's why eating the three Ps regularly will help you drop pounds. Protein fills you up. You need it to build lean muscle, which keeps your metabolism humming so that you can torch more fat. People in a weight-loss program who ate double the recommended daily allowance for protein (about 110 grams for a 150-pound woman) lost 70 percent of their weight from fat, while people who ate the RDA lost only about 40 percent, one study found. Produce is packed with filling fiber. "It's very difficult  to consume too many calories if you're eating a lot of vegetables. Example: Three cups of broccoli is a lot of food, yet only 93 calories. (Fruit is another story. It can be easy to overeat and can contain a lot of calories from sugar, so be sure to monitor your intake.) Plant-based fats like olive oil and those in avocados and nuts are healthy and extra satiating.  Use it to lose it. Aim to incorporate each of the three Ps into every meal and snack. People who eat protein throughout the day are able to keep weight off, according to a study in the [employer]. In addition to meat, poultry and seafood, good sources are beans, lentils, eggs, tofu, and yogurt. As for fat, keep portion sizes in check by measuring out salad dressing, oil, and nut butters (shoot for one to two tablespoons). Finally, eat veggies or a little fruit at every meal. People who did that consumed 308 fewer calories but didn't feel any hungrier than when they didn't eat more produce.  7. How You  Eat Is As Important As What You Eat In order for your brain to register that you're full, you need to focus on what you're eating. Sit down whenever you eat, preferably at a table. Turn off the TV or computer, put down your phone, and look at your food. Smell it. Chew slowly, and don't put another bite on your fork until you swallow. When women ate lunch this attentively, they consumed 30 percent less when snacking later than those who listened to an audiobook at lunchtime, according to a study in the [place] of Nutrition. 8. Weighing Yourself Really Works The scale provides the best evidence about whether your efforts are paying off. Seeing the numbers tick up or down or stagnate is motivation to keep going--or to rethink your approach. A 2015 study at [facility] found that daily weigh-ins helped people lose more weight, keep it off, and maintain that loss, even after two years. Use it to lose it. Step on the scale at the same time every day for the best results. If your weight shoots up several pounds from one weigh-in to the next, don't freak out. Eating a lot of salt the night before or having your period is the likely culprit. The number should return to normal in a day or two. It's a steady climb that you need to do something about. 9. Too Much Stress and Too Little Sleep Are Your Enemies When you're tired and frazzled, your body cranks up the production of cortisol, the stress hormone that can cause carb cravings. Not getting enough sleep also boosts your levels of ghrelin, a hormone associated with hunger, while suppressing leptin, a hormone that signals fullness and satiety. People on a diet who slept only five and a half hours a night for two weeks lost 55 percent less fat and were hungrier than those who slept eight and a half hours, according to a study in the [place]. Use it to lose it. Prioritize sleep, aiming for seven hours or more a night, which  research shows helps lower stress. And make sure you're getting quality zzz's. If a snoring spouse or a fidgety cat wakes you up frequently throughout the night, you may end up getting the equivalent of just four hours of sleep, according to a study from [facility]. Keep pets out of the bedroom, and use a white-noise app to drown out snoring. 10.  You Will Hit a plateau--And You Can Bust Through It As you slim down, your body releases much less leptin, the fullness hormone.  If you're not strength training, start right now. Building muscle can raise your metabolism to help you overcome a plateau. To keep your body challenged and burning calories, incorporate new moves and more intense intervals into your workouts or add another sweat session to your weekly routine. Alternatively, cut an extra 100 calories or so a day from your diet. Now that you've lost weight, your body simply doesn't need as much fuel.    Since food equals calories, in order to lose weight you must either eat fewer calories, exercise more to burn off calories with activity, or both. Food that is not used to fuel the body is stored as fat. A major component of losing weight is to make smarter food choices. Here's how:  1)   Limit non-nutritious foods, such as: Sugar, honey, syrups and candy Pastries, donuts, pies, cakes and cookies Soft drinks, sweetened juices and alcoholic beverages  2)  Cut down on high-fat foods by: - Choosing poultry, fish or lean red meat - Choosing low-fat cooking methods, such as baking, broiling, steaming, grilling and boiling - Using low-fat or non-fat dairy products - Using vinaigrette, herbs, lemon or fat-free salad dressings - Avoiding fatty meats, such as bacon, sausage, franks, ribs and luncheon meats - Avoiding high-fat snacks like nuts, chips and chocolate - Avoiding fried foods - Using less butter, margarine, oil and mayonnaise - Avoiding high-fat gravies, cream sauces and  cream-based soups  3) Eat a variety of foods, including: - Fruit and vegetables that are raw, steamed or baked - Whole grains, breads, cereal, rice and pasta - Dairy products, such as low-fat or non-fat milk or yogurt, low-fat cottage cheese and low-fat cheese - Protein-rich foods like chicken, [place], fish, lean meat and legumes, or beans  4) Change your eating habits by: - Eat three balanced meals a day to help control your hunger - Watch portion sizes and eat small servings of a variety of foods - Choose low-calorie snacks - Eat only when you are hungry and stop when you are satisfied - Eat slowly and try not to perform other tasks while eating - Find other activities to distract you from food, such as walking, taking up a hobby or being involved in the community - Include regular exercise in your daily routine ( minimum of 20 min of moderate-intensity exercise at least 5 days/week)  - Find a support group, if necessary, for emotional support in your weight loss journey           Easy ways to cut 100 calories   1. Eat your eggs with hot sauce OR salsa instead of cheese.  Eggs are great for breakfast, but many people consider eggs and cheese to be BFFs. Instead of cheese--1 oz. of cheddar has 114 calories--top your eggs with hot sauce, which contains no calories and helps with satiety and metabolism. Salsa is also a great option!!  2. Top your toast, waffles or pancakes with fresh berries instead of jelly or syrup. Half a cup of berries--fresh, frozen or thawed--has about 40 calories, compared with 2 tbsp. of maple syrup or jelly, which both have about 100 calories. The berries will also give you a good punch of fiber, which helps keep you full and satisfied and wont spike blood sugar quickly like the jelly or syrup. 3. Swap the non-fat latte for black coffee with  a splash of half-and-half. Contrary to its name, that non-fat latte has 130 calories and a startling 19g of  carbohydrates per 16 oz. serving. Replacing that light drinkable dessert with a black coffee with a splash of half-and-half saves you more than 100 calories per 16 oz. serving. 4. Sprinkle salads with freeze-dried raspberries instead of dried cranberries. If you want a sweet addition to your nutritious salad, stay away from dried cranberries. They have a whopping 130 calories per  cup and 30g carbohydrates. Instead, sprinkle freeze-dried raspberries guilt-free and save more than 100 calories per  cup serving, adding 3g of belly-filling fiber. 5. Go for mustard in place of mayo on your sandwich. Mustard can add really nice flavor to any sandwich, and there are tons of varieties, from spicy to honey. A serving of mayo is 95 calories, versus 10 calories in a serving of mustard.  Or try an avocado mayo spread: You can find the recipe few click this link: [email or web address]-container/[email or web address]-mayo 6. Choose a DIY salad dressing instead of the store-bought kind. Mix Dijon or whole grain mustard with low-fat Kefir or red wine vinegar and garlic. 7. Use hummus as a spread instead of a dip. Use hummus as a spread on a high-fiber cracker or tortilla with a sandwich and save on calories without sacrificing taste. 8. Pick just one salad accessory. Salad isnt automatically a calorie winner. Its easy to over-accessorize with toppings. Instead of topping your salad with nuts, avocado and cranberries (all three will clock in at 313 calories), just pick one. The next day, choose a different accessory, which will also keep your salad interesting. You dont wear all your jewelry every day, right? 9. Ditch the white pasta in favor of spaghetti squash. One cup of cooked spaghetti squash has about 40 calories, compared with traditional spaghetti, which comes with more than 200. Spaghetti squash is also nutrient-dense. Its a good source of fiber and Vitamins A and C, and it  can be eaten just like you would eat pasta--with a great tomato sauce and [place] meatballs or with pesto, tofu and spinach, for example. 10. Dress up your chili, soups and stews with non-fat [place] yogurt instead of sour cream. Just a dollop of sour cream can set you back 115 calories and a whopping 12g of fat--seven of which are of the artery-clogging variety. Added bonus: [place] yogurt is packed with muscle-building protein, calcium and B Vitamins. 11. Mash cauliflower instead of mashed potatoes. One cup of traditional mashed potatoes--in all their creamy goodness--has more than 200 calories, compared to mashed cauliflower, which you can typically eat for less than 100 calories per 1 cup serving. Cauliflower is a great source of the antioxidant indole-3-carbinol (I3C), which may help reduce the risk of some cancers, like breast cancer. 12. Ditch the ice cream sundae in favor of a [place] yogurt parfait. Instead of a cup of ice cream or fro-yo for dessert, try 1 cup of nonfat Greek yogurt topped with fresh berries and a sprinkle of cacao nibs. Both toppings are packed with antioxidants, which can help reduce cellular inflammation and oxidative damage. And the comparison is a no-brainer: One cup of ice cream has about 275 calories; one cup of frozen yogurt has about 230; and a cup of Greek yogurt has just 130, plus twice the protein, so youre less likely to return to the freezer for a second helping. 13. Put olive oil in a spray container instead of using it directly from the bottle. Each tablespoon of olive  oil is 120 calories and 15g of fat. Use a mister instead of pouring it straight into the pan or onto a salad. This allows for portion control and will save you more than 100 calories. 14. When baking, substitute canned pumpkin for butter or oil. Canned pumpkin--not pumpkin pie mix--is loaded with Vitamin A, which is important for skin and eye health, as well as immunity. And the comparisons are pretty  crazy:  cup of canned pumpkin has about 40 calories, compared to butter or oil, which has more than 800 calories. Yes, 800 calories. Applesauce and mashed banana can also serve as good substitutions for butter or oil, usually in a 1:1 ratio. 15. Top casseroles with high-fiber cereal instead of breadcrumbs. Breadcrumbs are typically made with white bread, while breakfast cereals contain 5-9g of fiber per serving. Not only will you save more than 150 calories per  cup serving, the swap will also keep you more full and youll get a metabolism boost from the added fiber. 16. Snack on pistachios instead of macadamia nuts. Believe it or not, you get the same amount of calories from 35 pistachios (100 calories) as you would from only five macadamia nuts. 17. Chow down on kale chips rather than potato chips. This is my favorite dont knock it till you try it swap. Kale chips are so easy to make at home, and you can spice them up with a little grated parmesan or chili powder. Plus, theyre a mere fraction of the calories of potato chips, but with the same crunch factor we crave so often. 18. Add seltzer and some fruit slices to your cocktail instead of soda or fruit juice. One cup of soda or fruit juice can pack on as much as 140 calories. Instead, use seltzer and fruit slices. The fruit provides valuable phytochemicals, such as flavonoids and anthocyanins, which help to combat cancer and stave off the aging process.

## 2018-04-25 LAB — COMPREHENSIVE METABOLIC PANEL
ALBUMIN: 4.5 g/dL (ref 3.6–4.8)
ALT: 67 IU/L — ABNORMAL HIGH (ref 0–32)
AST: 54 IU/L — ABNORMAL HIGH (ref 0–40)
Albumin/Globulin Ratio: 1.6 (ref 1.2–2.2)
Alkaline Phosphatase: 106 IU/L (ref 39–117)
BILIRUBIN TOTAL: 0.3 mg/dL (ref 0.0–1.2)
BUN / CREAT RATIO: 26 (ref 12–28)
BUN: 24 mg/dL (ref 8–27)
CHLORIDE: 101 mmol/L (ref 96–106)
CO2: 24 mmol/L (ref 20–29)
Calcium: 9.9 mg/dL (ref 8.7–10.3)
Creatinine, Ser: 0.92 mg/dL (ref 0.57–1.00)
GFR calc non Af Amer: 66 mL/min/{1.73_m2} (ref >59)
GFR, EST AFRICAN AMERICAN: 76 mL/min/{1.73_m2} (ref >59)
GLOBULIN, TOTAL: 2.8 g/dL (ref 1.5–4.5)
GLUCOSE: 139 mg/dL — AB (ref 65–99)
Potassium: 4.4 mmol/L (ref 3.5–5.2)
Sodium: 140 mmol/L (ref 134–144)
TOTAL PROTEIN: 7.3 g/dL (ref 6.0–8.5)

## 2018-05-28 ENCOUNTER — Telehealth: Payer: Self-pay | Admitting: Obstetrics and Gynecology

## 2018-05-28 ENCOUNTER — Encounter: Payer: Self-pay | Admitting: *Deleted

## 2018-05-28 NOTE — Telephone Encounter (Signed)
The patient called and stated that she would like Amy to place orders for a lab appointment she made tomorrow morning 05/28/2018 at 8:15 am. The patient mentioned that the appointment is in regards to hormone imbalance and if her nurse/provider has any questions to please reach out to the patient using her preferred form of contact. [phone].  Please advise.

## 2018-05-28 NOTE — Telephone Encounter (Signed)
Sent pt mychart message

## 2018-05-29 ENCOUNTER — Encounter: Payer: Self-pay | Admitting: Obstetrics and Gynecology

## 2018-05-29 ENCOUNTER — Other Ambulatory Visit: Payer: BLUE CROSS/BLUE SHIELD

## 2018-05-29 ENCOUNTER — Ambulatory Visit: Payer: BLUE CROSS/BLUE SHIELD | Admitting: Obstetrics and Gynecology

## 2018-05-29 VITALS — BP 130/80 | HR 105 | Ht 67.0 in | Wt 202.9 lb

## 2018-05-29 DIAGNOSIS — Z780 Asymptomatic menopausal state: Secondary | ICD-10-CM

## 2018-05-29 DIAGNOSIS — N951 Menopausal and female climacteric states: Secondary | ICD-10-CM

## 2018-05-30 ENCOUNTER — Other Ambulatory Visit: Payer: Self-pay | Admitting: Obstetrics and Gynecology

## 2018-05-30 DIAGNOSIS — R7989 Other specified abnormal findings of blood chemistry: Secondary | ICD-10-CM

## 2018-05-30 LAB — DHEA-SULFATE: DHEA SO4: 72.5 ug/dL (ref 29.4–220.5)

## 2018-05-30 LAB — THYROID PANEL WITH TSH
FREE THYROXINE INDEX: 1.8 (ref 1.2–4.9)
T3 Uptake Ratio: 24 % (ref 24–39)
T4, Total: 7.7 ug/dL (ref 4.5–12.0)
TSH: 2.53 u[IU]/mL (ref 0.450–4.500)

## 2018-05-30 LAB — PROGESTERONE: Progesterone: 0.1 ng/mL

## 2018-05-30 LAB — TESTOSTERONE: Testosterone: 4 ng/dL (ref 3–41)

## 2018-05-30 LAB — ESTRADIOL: Estradiol: <5 pg/mL

## 2018-05-30 MED ORDER — PROGESTERONE MICRONIZED 200 MG PO CAPS: 200.0000 mg | ORAL_CAPSULE | Freq: Every day | ORAL | 4 refills | Status: DC

## 2018-06-04 ENCOUNTER — Other Ambulatory Visit: Payer: Self-pay | Admitting: Family Medicine

## 2018-06-12 ENCOUNTER — Encounter: Payer: Self-pay | Admitting: Obstetrics and Gynecology

## 2018-06-12 ENCOUNTER — Ambulatory Visit (INDEPENDENT_AMBULATORY_CARE_PROVIDER_SITE_OTHER): Payer: BLUE CROSS/BLUE SHIELD | Admitting: Obstetrics and Gynecology

## 2018-06-12 VITALS — BP 126/74 | HR 98 | Ht 67.0 in | Wt 202.1 lb

## 2018-06-12 DIAGNOSIS — N951 Menopausal and female climacteric states: Secondary | ICD-10-CM

## 2018-06-12 DIAGNOSIS — Z79899 Other long term (current) drug therapy: Secondary | ICD-10-CM | POA: Diagnosis not present

## 2018-06-12 MED ORDER — ESTRADIOL 0.5 MG PO TABS: 0.5000 mg | ORAL_TABLET | Freq: Every day | ORAL | 4 refills | Status: DC

## 2018-06-12 NOTE — Progress Notes (Signed)
  Subjective:     Patient ID: Mercedes Webb, female   DOB: May 30, 1955, 63 y.o.   MRN: [id number]  HPI Here to review labs and follow up on previous visit. Restarted progesterone and is feeling markedly better. No concerns at this time. Needs estrogen refilled.  Review of Systems  Psychiatric/Behavioral: Positive for sleep disturbance.  All other systems reviewed and are negative.      Objective:   Physical Exam A&Ocx4 Well groomed female in no distress Blood pressure 126/74, pulse 98, height 5\' 7"  (1.702 m), weight 202 lb 1.6 oz (91.7 kg). Body mass index is 31.65 kg/m.     Assessment:     Menopausal hot flashes Medication management    Plan:     Refilled estrogen, will stay on current dose.  Reviewed labs at length and answered questions.  RTC in 3 months for annual exam or as needed.  Melody shambley,CNM

## 2018-06-25 ENCOUNTER — Telehealth: Payer: Self-pay | Admitting: Family Medicine

## 2018-06-25 NOTE — Telephone Encounter (Signed)
Patient called states provider reduced Ambien Rx to 6.25 from ( 12.5 MG) but she states the lower dosage is NOT working & she wishes to return to the Higher dose of 12.5 MG:  Zolpidem (AMBIEN CR) 12.5 MG CR tablet [[id number]] DISCONTINUED   Order Details  Dose: 12.5 mg Route: Oral Frequency: At bedtime PRN for sleep  Indications of Use: Insomnia  Dispense Quantity: 90 tablet Refills: 0 Fills remaining: --        Sig: Take 1 tablet (12.5 mg total) by mouth at bedtime as needed for sleep.      --Forwarding request to medical assistant for review w/provider & to contact patient if questions.  If approved Pt needs a refill as she has been taking (2)of :    zolpidem (AMBIEN CR) 6.25 MG CR tablet [[id number]]   Order Details  Dose: 6.25 mg Route: Oral Frequency: At bedtime PRN for sleep  Indications of Use: Insomnia  Dispense Quantity: 90 tablet Refills: 0 Fills remaining: --        Sig: Take 1 tablet (6.25 mg total) by mouth at bedtime as needed for sleep.     ---Send refill order to  :   [facility], [place] - [place] [phone] (Phone) [phone] (Fax)   --glh

## 2018-06-26 ENCOUNTER — Other Ambulatory Visit: Payer: Self-pay

## 2018-06-26 DIAGNOSIS — F39 Unspecified mood [affective] disorder: Secondary | ICD-10-CM

## 2018-06-26 DIAGNOSIS — F5101 Primary insomnia: Secondary | ICD-10-CM

## 2018-06-26 MED ORDER — ZOLPIDEM TARTRATE ER 12.5 MG PO TBCR: 12.5000 mg | EXTENDED_RELEASE_TABLET | Freq: Every evening | ORAL | 0 refills | Status: DC | PRN

## 2018-06-26 NOTE — Telephone Encounter (Signed)
Patient is requesting a refill on Ambien.  Patient was last seen on 04/24/2018 at which time Ambien was decreased from 12.5 mg to 6.25 mg. RX written for 6.25 mg tablets #90 no refills.  Patient states that the 6.25 mg was not working for her and she has had to start taking 2 tablets nightly and this has caused her to run out of medication early.  Patient is requesting to go back on the 12.5 mg.  Please review and advise. [place] controlled substance database reviewed and no aberrancies noted.  MPulliam, CMA/RT(R)

## 2018-06-26 NOTE — Telephone Encounter (Signed)
Sent request to Dr. Raliegh Scarlet to review and advise on the requested dose change. MPulliam, CMA/RT(R)

## 2018-07-14 ENCOUNTER — Other Ambulatory Visit: Payer: Self-pay | Admitting: Family Medicine

## 2018-07-14 DIAGNOSIS — F410 Panic disorder [episodic paroxysmal anxiety] without agoraphobia: Secondary | ICD-10-CM

## 2018-07-15 NOTE — Telephone Encounter (Signed)
coreection medication last filled on 01/20/2018

## 2018-07-15 NOTE — Telephone Encounter (Signed)
Med last filled 05/19/18 # 30 no refills.  Patient last seen for chronic care on 03/17/2018.  Controlled substance database reviewed no aberrancies noted. MPulliam, CMA/RT(R)

## 2018-08-20 ENCOUNTER — Other Ambulatory Visit: Payer: Self-pay

## 2018-08-20 ENCOUNTER — Ambulatory Visit (INDEPENDENT_AMBULATORY_CARE_PROVIDER_SITE_OTHER): Payer: BLUE CROSS/BLUE SHIELD | Admitting: Family Medicine

## 2018-08-20 ENCOUNTER — Encounter: Payer: Self-pay | Admitting: Family Medicine

## 2018-08-20 VITALS — BP 127/84 | Ht 67.0 in | Wt 201.4 lb

## 2018-08-20 DIAGNOSIS — F411 Generalized anxiety disorder: Secondary | ICD-10-CM | POA: Diagnosis not present

## 2018-08-20 DIAGNOSIS — F430 Acute stress reaction: Secondary | ICD-10-CM | POA: Diagnosis not present

## 2018-08-20 DIAGNOSIS — R61 Generalized hyperhidrosis: Secondary | ICD-10-CM

## 2018-08-20 NOTE — Progress Notes (Signed)
Virtual Visit via Telephone/ Video Note for Mellody Dance, D.O- Primary Care Physician at [facility]   I connected with current patient today by telephone/ video and verified that I am speaking with the correct person using two identifiers.    This visit type was conducted due to national recommendations for restrictions regarding the COVID-19 Pandemic (e.g. social distancing) in an effort to limit this patient's exposure and mitigate transmission in our community.  Due to her co-morbid illnesses, this patient is at least at moderate risk for complications without adequate follow up.  This format is felt to be most appropriate for this patient at this time.  The patient did not have access to video technology/had technical difficulties with video requiring transitioning to audio format only (telephone).  No physical exam could be performed with this format, beyond that communicated to [place] by the patient/ family members as noted.  Additionally my staff members also discussed with the patient that there may be a patient charge related to this service.   The patient expressed understanding, and agreed to proceed.      History of Present Illness:   Patient is followed regularly by her certified nurse midwife Melody [place].  Recently patient had full set of blood work with them on 05/29/2018 which included progesterone, estradiol, DHEA, testosterone, CMP and thyroid panel.   Review of these labs showed essentially all within normal limits.   GAD:  In the past we had patient on Prozac- spring 2019 for a while and then switch to Celexa on 11/11/17 when she said prozac made her sweat too much;  In Sept -we inc dose to 20 and added buspar.    I did have past discussions with her that SSRIs can cause sweating.  Pt states that she saw a show that oxybutrin ceases sweating  --> Pt's GYN provider put her on progesterone in addition to estrodial - thinks that will help her mood.  - pt weened  herself off celexa and buspar last doses March 6th.  SHE IS NOT SWEATING LESS since taking her self of the meds.     Sweating can sometimes wake her from sleep  She is tearful during OV today talking about caring for someone with cancer - niece; under a lot of stress with COVID etc.  Etc.    Last ov started fish oil- Lovaza.    Pt retired Jan 1st; put on 25-30lbs. this makes her very distressed sad and cried today      Wt Readings from Last 3 Encounters:  08/20/18 201 lb 6.4 oz (91.4 kg)  06/12/18 202 lb 1.6 oz (91.7 kg)  05/29/18 202 lb 14.4 oz (92 kg)    BP Readings from Last 3 Encounters:  08/20/18 127/84  06/12/18 126/74  05/29/18 130/80    Pulse Readings from Last 3 Encounters:  06/12/18 98  05/29/18 (!) 105  04/24/18 82    BMI Readings from Last 3 Encounters:  08/20/18 31.54 kg/m  06/12/18 31.65 kg/m  05/29/18 31.78 kg/m      -Vitals obtained; Medications, allergies reconciled;  personal medical, social, Sx etc. etc. histories were updated by Lanier Prude the medical assistant today and are reflected in below chart   Patient Care Team    Relationship Specialty Notifications Start End  Mellody Dance, DO PCP - General Family Medicine  11/26/16   Defrancesco, Alanda Slim, MD Consulting Physician Obstetrics and Gynecology  11/12/16   Joylene Igo, Altha Midwife Obstetrics  and Gynecology  11/12/16   Danella Sensing, MD Consulting Physician Dermatology  11/12/16   Richmond Campbell, MD Consulting Physician Gastroenterology  11/26/16   Carol Ada, MD Consulting Physician Gastroenterology  11/26/16   Keene Breath., MD  Ophthalmology  11/26/16   Joylene Igo, CNM Midwife Obstetrics and Gynecology  11/26/16    Comment: Prescribes her Seroquel     Patient Active Problem List   Diagnosis Date Noted  . Obesity, Class I, BMI 30-34.9 06/10/2017    Priority: High  . Primary insomnia 11/12/2016    Priority: High  . Adjustment disorder with mixed anxiety  and depressed mood 11/12/2016    Priority: High  . Elevated cholesterol with elevated triglycerides 09/28/2016    Priority: High  . Essential hypertension 01/31/2016    Priority: High  . Generalized anxiety disorder 01/31/2016    Priority: High  . Postmenopausal- 30 yrs ago TAH "yrs ago" 11/12/2016    Priority: Medium  . Hormone imbalance- txed by gyn 11/12/2016    Priority: Medium  . Mixed hyperlipidemia 11/12/2016    Priority: Medium  . Hypokalemia 02/01/2016    Priority: Medium  . Vitamin D deficiency 09/28/2016    Priority: Low  . Status post abdominal hysterectomy 12/23/2014    Priority: Low  . Acute stress reaction 08/20/2018  . Excess, secretion, sweat 08/20/2018  . Low serum progesterone 05/30/2018  . Mood disorder ([place])- mixed anxiety and depression 01/20/2018  . Panic attack 01/20/2018  . Excessive sweating- likely due to hormonal 11/11/2017  . Inactivity 11/11/2017  . Elevated serum creatinine 09/04/2017  . Elevated ALT measurement 09/04/2017  . Acute biliary pancreatitis 01/31/2016  . Overweight 12/23/2014  . Surgical menopause 12/23/2014  . History of cervical dysplasia 12/23/2014  . SUI (stress urinary incontinence, female) 12/23/2014     Current Meds  Medication Sig  . ALPRAZolam (XANAX) 1 MG tablet TAKE 1 TABLET BY MOUTH AS NEEDED (PANIC ONLY)  . Ascorbic Acid (VITAMIN C) 1000 MG tablet Take 1,000 mg by mouth daily.  Marland Kitchen aspirin EC 81 MG tablet Take 1 tablet (81 mg total) by mouth daily.  Marland Kitchen atorvastatin (LIPITOR) 10 MG tablet Take 1 tablet (10 mg total) by mouth at bedtime.  . Calcium Carbonate (CALTRATE 600 PO) Take 1 tablet by mouth daily.  Marland Kitchen estradiol (ESTRACE) 0.5 MG tablet Take 1 tablet (0.5 mg total) by mouth daily.  . Multiple Vitamins-Minerals (CENTRUM SILVER PO) Take 1 tablet by mouth daily.  Marland Kitchen omega-3 acid ethyl esters (LOVAZA) 1 g capsule Take 2 capsules (2 g total) by mouth 2 (two) times daily.  . potassium chloride (K-DUR,KLOR-CON) 10 MEQ  tablet Take 1 tablet (10 mEq total) by mouth daily.  . progesterone (PROMETRIUM) 200 MG capsule Take 1 capsule (200 mg total) by mouth daily.  Marland Kitchen triamterene-hydrochlorothiazide (MAXZIDE-25) 37.5-25 MG tablet Take 1 tablet by mouth daily.  . valACYclovir (VALTREX) 500 MG tablet Take 1 tablet (500 mg total) by mouth daily.  . Vitamin D, Ergocalciferol, (DRISDOL) 50000 units CAPS capsule Take 1 capsule (50,000 Units total) by mouth every 7 (seven) days.  Marland Kitchen zolpidem (AMBIEN CR) 12.5 MG CR tablet Take 1 tablet (12.5 mg total) by mouth at bedtime as needed for sleep.     Allergies:  Allergies  Allergen Reactions  . Prozac [Fluoxetine Hcl] Other (See Comments)    sweating     ROS:  See above HPI for pertinent positives and negatives   Objective:   Blood pressure 127/84, height 5\' 7"  (  1.702 m), weight 201 lb 6.4 oz (91.4 kg). (if some vitals are omitted, this means that patient was UNABLE to obtain them even though asked to get them prior to OV today) General: sounds in no acute distress.  Skin: Pt confirms warm and dry  extremities and pink fingertips Respiratory: speaking in full sentences, no conversational dyspnea Psych: A and O *3, appears insight good, mood- full      Impression and Recommendations:      ICD-10-CM   1. GAD (generalized anxiety disorder) F41.1   2. Excess, secretion, sweat R61    Does not appear to be primary focal hyperhidrosis but more of a generalized sweating including night sweats etc.   3. Acute stress reaction F43.0     Discussed possible medications, treatments for anxiety/stress as well as sweating.  --> We will restart BuSpar.  She does not need a prescription as she has the medicine already at home.   - She was instructed to start low dose and slowly we will wean that up.   - We will have close follow-up in 10 days since she is more emotionally distraught.   ((Per up to date: The norepinephrine and serotonin antagonist mirtazapine reduced  diaphoresis within 2 weeks of initiation at 15 mg/d with no adverse effects.5 Sweating resolved after mirtazapine was titrated to 60 mg/d. In addition to excess serotonin activity, diaphoresis may result from decreased dopaminergic tone in the hypothalamus. Centrally acting dopamine agonists-even partial agonists-may restore homeostasis and decrease sweating. Aripiprazole, 10 to 20 mg/d, reduced sweating in 2 patients; no adverse effects were reported.))  ---> After discussion, patient would not like to start any new medicines at this time.  She wishes to hold off and really try to work on diet, exercise, healthy habits such as meditation, good sleep hygiene etc. Etc. --> Recommended CBT/counseling; patient declines referral and patient states she will look into it  As part of my medical decision making, I reviewed the following data within the [place] History obtained from pt/family, CMA notes reviewed and incorporated, Labs reviewed, Radiograph/ tests reviewed if applicable and OV notes from prior OV's with me, as well as other specialists he has seen since seeing me last, were all reviewed and used in my medical decision making process today. Additionally, discussion had with patient regarding txmnt plan, their biases about that plan etc were used in my medical decision making today.  I discussed the assessment and treatment plan with the patient. The patient was provided an opportunity to ask questions and all were answered.  The patient agreed with the plan and demonstrated an understanding of the instructions.   No barriers to understanding were identified.  Red flag symptoms and signs discussed in detail.  Patient expressed understanding regarding what to do in case of emergency\urgent symptoms   The patient was advised to call back or seek an in-person evaluation if the symptoms worsen or if the condition fails to improve as anticipated.   Return for 10 days.  Patient will  have log of temperatures twice daily, restart BuSpar.    *Gross side effects, risk and benefits, and alternatives of medications and treatment plan in general discussed with patient.  Patient is aware that all medications have potential side effects and we are unable to predict every side effect or drug-drug interaction that may occur.   Patient was strongly encouraged to call with any questions or concerns they may have concerns.     I provided 29++  minutes of non-face-to-face time during this encounter,with over 50% of the time in direct counseling on patients medical conditions/ concerns.  Additional time was spent with charting and coordination of care after the actual visit commenced.    Mellody Dance, DO

## 2018-08-28 ENCOUNTER — Encounter: Payer: Self-pay | Admitting: Family Medicine

## 2018-08-28 ENCOUNTER — Other Ambulatory Visit: Payer: Self-pay

## 2018-08-28 ENCOUNTER — Ambulatory Visit (INDEPENDENT_AMBULATORY_CARE_PROVIDER_SITE_OTHER): Payer: BLUE CROSS/BLUE SHIELD | Admitting: Family Medicine

## 2018-08-28 VITALS — BP 133/77 | HR 90 | Temp 97.4°F

## 2018-08-28 DIAGNOSIS — F411 Generalized anxiety disorder: Secondary | ICD-10-CM | POA: Diagnosis not present

## 2018-08-28 DIAGNOSIS — L749 Eccrine sweat disorder, unspecified: Secondary | ICD-10-CM | POA: Diagnosis not present

## 2018-08-28 DIAGNOSIS — R61 Generalized hyperhidrosis: Secondary | ICD-10-CM | POA: Diagnosis not present

## 2018-08-28 MED ORDER — BUSPIRONE HCL 15 MG PO TABS: 15.0000 mg | ORAL_TABLET | Freq: Three times a day (TID) | ORAL | 5 refills | Status: DC

## 2018-08-28 NOTE — Progress Notes (Addendum)
Telehealth office visit note for Mercedes Webb, D.O- at [facility] at [facility]   I connected with current patient today and verified that I am speaking with the correct person using two identifiers.   . Location of the patient: Home . Location of the provider: Office Only the patient (+/- their family members at pt's discretion) and myself were participating in the encounter    - This visit type was conducted due to national recommendations for restrictions regarding the COVID-19 Pandemic (e.g. social distancing) in an effort to limit this patient's exposure and mitigate transmission in our community.  This format is felt to be most appropriate for this patient at this time.   - The patient did not have access to video technology or had technical difficulties with video requiring transitioning to audio format only. - No physical exam could be performed with this format, beyond that communicated to [place] by the patient/ family members as noted.   - Additionally my office staff/ schedulers discussed with the patient that there may be a monetary charge related to this service, depending on their medical insurance.   The patient expressed understanding, and agreed to proceed.       History of Present Illness:  GAD:  Pt was told to restart Buspar last week for her anxiety.  Some days forgets to take all 3 tabs but most days she is.   No Celexa- as pt did not want to start   For her hot flashes/ excess sweating and feeling like she was burning up at times-   Was told to check temps daily last OV. Nothing higher than 98.5.  She was taking it 2-3 times per day.    - Seen by her GYN and partial Hyst in early 30's- then total hyst done 3 yrs later due to benign mass in ovaries.  Recently saw GYN again and she was told that it was likely not from a gynecological reason. -  No sweating in childhood -  sweating Started in 60's and has gotten worse.  She cannot relate any specific medication,  event etc. associated with it. -  occurs in the middle of the night and wakes her up.  It is not localized to palms of hands, axilla etc but rather all over but most significantly from the waist up per patient.  Thus, not consistent with primary focal hyperhidrosis. -The symptoms did not start just after starting a certain medicine and also;  since discontinuing the Celexa- her symptoms have not improved one bit- thus not med related. -Patient is very distressed by this and states that it is negatively affecting her life.  She is looking for answers and wants to be referred to another specialist who could maybe rule out other possibilities.     Impression and Recommendations:    1. GAD (generalized anxiety disorder)   2. Sweating abnormality   3. Secondary hyperhidrosis- not focal in nature but primarily from waist up is the worst    -Mood improved some on BuSpar.  She will continue that medicine and dose increased today.  See below. -At this time many of more common reasons such as medications, female hormonal abnormalities etc. have been ruled out.  Will order HIV, but patient is extremely low dose.  She denies TB test and states that this is not consistent with night sweats but rather all day long and she has no increased risks -Discussed with patient I can try to see  if endocrinology will see her to rule out very unusual etiologies for her hyperhidrosis.  Told her things that these things are extremely rare such as pheochromocytoma or other endocrine issues, but I certainly will refer her per patient's request. -Advised to use anti-push parent and deodorant before bedtime as well as throughout the day.  Explained to her I prefer her to get the clinical anti-per sprint deodorant that is available over-the-counter.   - She declines prescription antiperspirant medication today  - As part of my medical decision making, I reviewed the following data within the [place] History  obtained from pt /family, CMA notes reviewed and incorporated if applicable, Labs reviewed, Radiograph/ tests reviewed if applicable and OV notes from prior OV's with me, as well as other specialists she/he has seen since seeing me last, were all reviewed and used in my medical decision making process today.   - Additionally, discussion had with patient regarding txmnt plan, and their biases/concerns about that plan were used in my medical decision making today.   - The patient agreed with the plan and demonstrated an understanding of the instructions.   No barriers to understanding were identified.   - Red flag symptoms and signs discussed in detail.  Patient expressed understanding regarding what to do in case of emergency\ urgent symptoms.  The patient was advised to call back or seek an in-person evaluation if the symptoms worsen or if the condition fails to improve as anticipated.   Return for 2M0- inc buspar- GAD, also refered to Endo for excess sweat; needs Fast bldwrk.    Orders Placed This Encounter  Procedures  . HIV Antibody (routine testing w rflx)  . Ambulatory referral to Endocrinology    Meds ordered this encounter  Medications  . DISCONTD: busPIRone (BUSPAR) 15 MG tablet    Sig: Take 1 tablet (15 mg total) by mouth 3 (three) times daily.    Dispense:  90 tablet    Refill:  5    Medications Discontinued During This Encounter  Medication Reason  . citalopram (CELEXA) 20 MG tablet Side effect (s)  . busPIRone (BUSPAR) 10 MG tablet Dose change      I provided 26+ minutes of non-face-to-face time during this encounter,with over 50% of the time in direct counseling on patients medical conditions/ medical concerns.  Additional time was spent with charting and coordination of care after the actual visit commenced.   Note:  This note was prepared with assistance of Dragon voice recognition software. Occasional wrong-word or sound-a-like substitutions may have occurred due to  the inherent limitations of voice recognition software.  Mercedes Dance, DO    -Vitals obtained; medications/ allergies reconciled;  personal medical, social, Sx etc.histories were updated by CMA, reviewed by me and are reflected in chart   Patient Care Team    Relationship Specialty Notifications Start End  Mercedes Dance, DO PCP - General Family Medicine  11/26/16   Defrancesco, Alanda Slim, MD Consulting Physician Obstetrics and Gynecology  11/12/16   Joylene Igo, CNM Midwife Obstetrics and Gynecology  11/12/16   Danella Sensing, MD Consulting Physician Dermatology  11/12/16   Richmond Campbell, MD Consulting Physician Gastroenterology  11/26/16   Carol Ada, MD Consulting Physician Gastroenterology  11/26/16   Keene Breath., MD  Ophthalmology  11/26/16   Joylene Igo, CNM Midwife Obstetrics and Gynecology  11/26/16    Comment: Prescribes her Seroquel     Patient Active Problem List   Diagnosis Date Noted  .  Obesity, Class I, BMI 30-34.9 06/10/2017    Priority: High  . Primary insomnia 11/12/2016    Priority: High  . Adjustment disorder with mixed anxiety and depressed mood 11/12/2016    Priority: High  . Elevated cholesterol with elevated triglycerides 09/28/2016    Priority: High  . Essential hypertension 01/31/2016    Priority: High  . Generalized anxiety disorder 01/31/2016    Priority: High  . Postmenopausal- 30 yrs ago TAH "yrs ago" 11/12/2016    Priority: Medium  . Hormone imbalance- txed by gyn 11/12/2016    Priority: Medium  . Mixed hyperlipidemia 11/12/2016    Priority: Medium  . Hypokalemia 02/01/2016    Priority: Medium  . Vitamin D deficiency 09/28/2016    Priority: Low  . Status post abdominal hysterectomy 12/23/2014    Priority: Low  . Acute stress reaction 08/20/2018  . Excess, secretion, sweat 08/20/2018  . Low serum progesterone 05/30/2018  . Mood disorder ([place])- mixed anxiety and depression 01/20/2018  . Panic attack 01/20/2018  .  Excessive sweating- likely due to hormonal 11/11/2017  . Inactivity 11/11/2017  . Elevated serum creatinine 09/04/2017  . Elevated ALT measurement 09/04/2017  . Acute biliary pancreatitis 01/31/2016  . Overweight 12/23/2014  . Surgical menopause 12/23/2014  . History of cervical dysplasia 12/23/2014  . SUI (stress urinary incontinence, female) 12/23/2014     Current Meds  Medication Sig  . ALPRAZolam (XANAX) 1 MG tablet TAKE 1 TABLET BY MOUTH AS NEEDED (PANIC ONLY)  . Ascorbic Acid (VITAMIN C) 1000 MG tablet Take 1,000 mg by mouth daily.  Marland Kitchen aspirin EC 81 MG tablet Take 1 tablet (81 mg total) by mouth daily.  Marland Kitchen atorvastatin (LIPITOR) 10 MG tablet Take 1 tablet (10 mg total) by mouth at bedtime.  . Calcium Carbonate (CALTRATE 600 PO) Take 1 tablet by mouth daily.  Marland Kitchen estradiol (ESTRACE) 0.5 MG tablet Take 1 tablet (0.5 mg total) by mouth daily.  . Multiple Vitamins-Minerals (CENTRUM SILVER PO) Take 1 tablet by mouth daily.  Marland Kitchen omega-3 acid ethyl esters (LOVAZA) 1 g capsule Take 2 capsules (2 g total) by mouth 2 (two) times daily.  . potassium chloride (K-DUR,KLOR-CON) 10 MEQ tablet Take 1 tablet (10 mEq total) by mouth daily.  . progesterone (PROMETRIUM) 200 MG capsule Take 1 capsule (200 mg total) by mouth daily.  Marland Kitchen triamterene-hydrochlorothiazide (MAXZIDE-25) 37.5-25 MG tablet Take 1 tablet by mouth daily.  . valACYclovir (VALTREX) 500 MG tablet Take 1 tablet (500 mg total) by mouth daily.  . Vitamin D, Ergocalciferol, (DRISDOL) 50000 units CAPS capsule Take 1 capsule (50,000 Units total) by mouth every 7 (seven) days.  Marland Kitchen zolpidem (AMBIEN CR) 12.5 MG CR tablet Take 1 tablet (12.5 mg total) by mouth at bedtime as needed for sleep.  . [DISCONTINUED] busPIRone (BUSPAR) 10 MG tablet Take 1 tablet (10 mg total) by mouth 3 (three) times daily.     Allergies:  Allergies  Allergen Reactions  . Prozac [Fluoxetine Hcl] Other (See Comments)    sweating     ROS:  See above HPI for  pertinent positives and negatives   Objective:   Blood pressure 133/77, pulse 90, temperature (!) 97.4 F (36.3 C), temperature source Oral.  (if some vitals are omitted, this means that patient was UNABLE to obtain them even though they were asked to get them prior to OV today.  They were asked to call us at their earliest convenience with these once obtained. )  General: A & O * 3;  sounds in no acute distress; in usual state of health.  Skin: Pt confirms warm and dry extremities and pink fingertips HEENT: Pt confirms lips non-cyanotic Chest: Patient confirms normal chest excursion and movement Respiratory: speaking in full sentences, no conversational dyspnea; patient confirms no use of accessory muscles Psych: insight appears good, mood- appears full

## 2018-09-10 ENCOUNTER — Other Ambulatory Visit: Payer: Self-pay

## 2018-09-12 ENCOUNTER — Other Ambulatory Visit: Payer: Self-pay

## 2018-09-12 ENCOUNTER — Encounter: Payer: Self-pay | Admitting: Endocrinology

## 2018-09-12 ENCOUNTER — Ambulatory Visit: Payer: BLUE CROSS/BLUE SHIELD | Admitting: Endocrinology

## 2018-09-12 VITALS — BP 144/80 | HR 104 | Ht 67.0 in | Wt 201.6 lb

## 2018-09-12 DIAGNOSIS — R61 Generalized hyperhidrosis: Secondary | ICD-10-CM

## 2018-09-12 NOTE — Progress Notes (Signed)
Subjective:    Patient ID: Mercedes Webb, female    DOB: [birth year], 63 y.o.   MRN: [id number]  HPI Pt is referred by Dr Raliegh Scarlet, for excessive diaphoresis.  she reports 2 years of intermittent moderate diaphoresis throughout the body, and assoc anxiety.  She is unable to cite precip or relieving factor.  sxs last 15 minutes at a time. She gets sxs daily.  she has h/o HTN.  she has no h/o adrenal disease, diabetes, migraine headache, MTC, alcohol withdrawal, neurofibromas, hemangiomas, brain aneurysm, cocaine use, recent menopause, or thyroid disease.   Past Medical History:  Diagnosis Date  . Anxiety   . Cancer ([place]) 2016   Skin CA squamous cell on bilateral feet removed  . Cervical dysplasia    h/o  . Colon polyps    polyps on first scope ~ 2006, no recurrent polyps ~ 2001 and in 2016.   . Endometriosis   . Family history of adverse reaction to anesthesia    Mother had difficulty waking & nausea  . Headache   . Hypertension   . Insomnia   . Pancreatitis 01/2016  . Recurrent cold sores     Past Surgical History:  Procedure Laterality Date  . ABDOMINAL HYSTERECTOMY     tah/bso- endometriosis  . CHOLECYSTECTOMY    . ERCP N/A 02/03/2016   Procedure: ENDOSCOPIC RETROGRADE CHOLANGIOPANCREATOGRAPHY (ERCP);  Surgeon: Carol Ada, MD;  Location: [facility] ENDOSCOPY;  Service: Endoscopy;  Laterality: N/A;  . KNEE ARTHROSCOPY    . tonsillectomy    . TONSILLECTOMY    . TUBAL LIGATION      Social History   Socioeconomic History  . Marital status: Divorced    Spouse name: Not on file  . Number of children: Not on file  . Years of education: Not on file  . Highest education level: Not on file  Occupational History  . Not on file  Social Needs  . Financial resource strain: Not on file  . Food insecurity:    Worry: Not on file    Inability: Not on file  . Transportation needs:    Medical: Not on file    Non-medical: Not on file  Tobacco Use  . Smoking status: Former Smoker     Last attempt to quit: 04/30/1988    Years since quitting: 30.3  . Smokeless tobacco: Never Used  Substance and Sexual Activity  . Alcohol use: Yes    Comment: occas  . Drug use: No  . Sexual activity: Yes    Birth control/protection: Surgical  Lifestyle  . Physical activity:    Days per week: Not on file    Minutes per session: Not on file  . Stress: Not on file  Relationships  . Social connections:    Talks on phone: Not on file    Gets together: Not on file    Attends religious service: Not on file    Active member of club or organization: Not on file    Attends meetings of clubs or organizations: Not on file    Relationship status: Not on file  . Intimate partner violence:    Fear of current or ex partner: Not on file    Emotionally abused: Not on file    Physically abused: Not on file    Forced sexual activity: Not on file  Other Topics Concern  . Not on file  Social History Narrative  . Not on file    Current Outpatient Medications on File Prior  to Visit  Medication Sig Dispense Refill  . ALPRAZolam (XANAX) 1 MG tablet TAKE 1 TABLET BY MOUTH AS NEEDED (PANIC ONLY) 30 tablet 0  . Ascorbic Acid (VITAMIN C) 1000 MG tablet Take 1,000 mg by mouth daily.    Marland Kitchen aspirin EC 81 MG tablet Take 1 tablet (81 mg total) by mouth daily. 90 tablet 3  . atorvastatin (LIPITOR) 10 MG tablet Take 1 tablet (10 mg total) by mouth at bedtime. 90 tablet 1  . Calcium Carbonate (CALTRATE 600 PO) Take 1 tablet by mouth daily.    Marland Kitchen estradiol (ESTRACE) 0.5 MG tablet Take 1 tablet (0.5 mg total) by mouth daily. 90 tablet 4  . Multiple Vitamins-Minerals (CENTRUM SILVER PO) Take 1 tablet by mouth daily.    Marland Kitchen omega-3 acid ethyl esters (LOVAZA) 1 g capsule Take 2 capsules (2 g total) by mouth 2 (two) times daily. 180 capsule 3  . potassium chloride (K-DUR,KLOR-CON) 10 MEQ tablet Take 1 tablet (10 mEq total) by mouth daily. 90 tablet 1  . progesterone (PROMETRIUM) 200 MG capsule Take 1 capsule (200 mg  total) by mouth daily. 60 capsule 4  . triamterene-hydrochlorothiazide (MAXZIDE-25) 37.5-25 MG tablet Take 1 tablet by mouth daily. 90 tablet 1  . valACYclovir (VALTREX) 500 MG tablet Take 1 tablet (500 mg total) by mouth daily. 90 tablet 1  . Vitamin D, Ergocalciferol, (DRISDOL) 50000 units CAPS capsule Take 1 capsule (50,000 Units total) by mouth every 7 (seven) days. 12 capsule 3  . zolpidem (AMBIEN CR) 12.5 MG CR tablet Take 1 tablet (12.5 mg total) by mouth at bedtime as needed for sleep. 90 tablet 0   No current facility-administered medications on file prior to visit.     Allergies  Allergen Reactions  . Prozac [Fluoxetine Hcl] Other (See Comments)    sweating    Family History  Problem Relation Age of Onset  . Diabetes Father   . Heart disease Father   . Breast cancer Sister 80  . Diabetes Brother   . Colon cancer Neg Hx   . Ovarian cancer Neg Hx     BP (!) 144/80 (BP Location: Left Arm, Patient Position: Sitting, Cuff Size: Large)   Pulse (!) 104   Ht 5\' 7"  (1.702 m)   Wt 201 lb 9.6 oz (91.4 kg)   SpO2 94%   BMI 31.58 kg/m    Review of Systems Denies pallor, n/v, syncope, diarrhea, chest pain, sob, visual loss, fever.  With episodes of sweating, she also gets palpitations and flushing.  She has intermitt headache, easy bruising, rhinorrhea, and arthralgias, but not along with other sxs.  She has chronic weight gain.     Objective:   Physical Exam VS: see vs page GEN: no distress HEAD: head: no deformity eyes: no periorbital swelling, no proptosis external nose and ears are normal mouth: no lesion seen.  No neurofibromata. NECK: supple, thyroid is not enlarged CHEST WALL: no deformity LUNGS: clear to auscultation CV: reg rate and rhythm, no murmur ABD: abdomen is soft, nontender.  no hepatosplenomegaly.  not distended.  no hernia MUSCULOSKELETAL: muscle bulk and strength are grossly normal.  no obvious joint swelling.  gait is normal and steady EXTEMITIES:  no deformity.  no ulcer on the feet.  feet are of normal color and temp.  no edema PULSES: dorsalis pedis intact bilat.  no carotid bruit NEURO:  cn 2-12 grossly intact.   readily moves all 4's.  sensation is intact to touch on the feet  SKIN:  Normal texture and temperature.  No rash or suspicious lesion is visible.   NODES:  None palpable at the neck PSYCH: alert, well-oriented.  Does not appear anxious nor depressed.   Lab Results  Component Value Date   TSH 2.530 05/29/2018   T3TOTAL 126 08/29/2017   T4TOTAL 7.7 05/29/2018   I have reviewed outside records, and summarized: Pt was noted to have excessive duiaphoresis, and referred here.  She was rx'ed celexa and buspar.  However, she declined celexa, due to side-effects of diaphoresis.      Assessment & Plan:  Excessive diaphoresis, new to me, uncertain etiology.   Patient Instructions  Blood tests, and a 24-HR urine test, are requested for you today.  We'll let you know about the results.  If these are normal, that would mean we can't find any condition in this field to explain your symptoms.

## 2018-09-12 NOTE — Patient Instructions (Addendum)
Blood tests, and a 24-HR urine test, are requested for you today.  We'll let you know about the results.  If these are normal, that would mean we can't find any condition in this field to explain your symptoms.

## 2018-09-15 ENCOUNTER — Other Ambulatory Visit: Payer: BLUE CROSS/BLUE SHIELD

## 2018-09-15 ENCOUNTER — Other Ambulatory Visit: Payer: Self-pay

## 2018-09-15 DIAGNOSIS — R61 Generalized hyperhidrosis: Secondary | ICD-10-CM

## 2018-09-17 LAB — CATECHOLAMINES, FRACTIONATED, PLASMA
Dopamine: 41 pg/mL — ABNORMAL HIGH
Epinephrine: <20 pg/mL
Norepinephrine: 1196 pg/mL — ABNORMAL HIGH
Total Catecholamines: 1237 pg/mL — ABNORMAL HIGH

## 2018-09-17 LAB — METANEPHRINES, PLASMA
Metanephrine, Free: <25 pg/mL (ref <=57)
Normetanephrine, Free: 160 pg/mL — ABNORMAL HIGH (ref <=148)
Total Metanephrines-Plasma: 160 pg/mL (ref <=205)

## 2018-09-17 LAB — CALCITONIN: Calcitonin: <2 pg/mL (ref <=5)

## 2018-09-18 LAB — METANEPHRINES, URINE, 24 HOUR
Metaneph Total, Ur: 594 mcg/24 h (ref 224–832)
Metanephrines, Ur: 106 mcg/24 h (ref 90–315)
Normetanephrine, 24H Ur: 488 mcg/24 h (ref 122–676)
Volume, Urine-VMAUR: 2600 mL

## 2018-09-18 LAB — CATECHOLAMINES, FRACTIONATED, URINE, 24 HOUR
Calculated Total (E+NE): 52 mcg/24 h (ref 26–121)
Creatinine, Urine mg/day-CATEUR: 1.71 g/(24.h) (ref 0.50–2.15)
Dopamine, 24 hr Urine: 359 mcg/24 h (ref 52–480)
Norepinephrine, 24 hr Ur: 52 mcg/24 h (ref 15–100)
Volume, Urine-VMAUR: 2600 mL

## 2018-09-19 ENCOUNTER — Telehealth: Payer: Self-pay | Admitting: Endocrinology

## 2018-09-19 LAB — 5 HIAA W/CREATININE, 24 HR
5-HIAA: 3.4 mg/24 h (ref <=6.0)
Creatinine: 1.74 g/(24.h) (ref 0.50–2.15)
Volume, Urine-VMAUR: 2600 mL

## 2018-09-19 NOTE — Telephone Encounter (Signed)
Please call patient at ph# [phone] for urine test results. Patient sees blood test results but not the urine test results.

## 2018-09-19 NOTE — Telephone Encounter (Signed)
5 HIAA w/Creatinine, 24 hr. (Order [id number])   Did she return her 24 hour urine for it to be resulted?

## 2018-09-23 ENCOUNTER — Other Ambulatory Visit: Payer: Self-pay

## 2018-09-23 ENCOUNTER — Other Ambulatory Visit: Payer: Self-pay | Admitting: Family Medicine

## 2018-09-23 DIAGNOSIS — F5101 Primary insomnia: Secondary | ICD-10-CM

## 2018-09-23 DIAGNOSIS — F39 Unspecified mood [affective] disorder: Secondary | ICD-10-CM

## 2018-09-23 NOTE — Telephone Encounter (Signed)
Last filled 06/26/18 for #90 LOV 08/01/2018 Please review and advise. MPulliam, CMA/RT(R)

## 2018-10-08 ENCOUNTER — Telehealth: Payer: Self-pay | Admitting: Family Medicine

## 2018-10-08 NOTE — Telephone Encounter (Signed)
Patient called say she is not taking Rx provider prescribed & has been to her Endocrinologist Appt ( wonders if PCP wants a F/U appt & labs as listed on 4/30 AVS )   ---Forwarding message to medical assistant for chart review w/provider & to contact patient with decision.  --glh

## 2018-10-09 NOTE — Telephone Encounter (Signed)
Spoke to patient and reviewed chart and advised that patient is due for follow up and full set of labs.  Appointments made for the patient. MPulliam, CMA/RT(R)'

## 2018-10-13 ENCOUNTER — Other Ambulatory Visit: Payer: BLUE CROSS/BLUE SHIELD

## 2018-10-17 ENCOUNTER — Other Ambulatory Visit: Payer: Self-pay | Admitting: Family Medicine

## 2018-10-17 ENCOUNTER — Other Ambulatory Visit: Payer: Self-pay | Admitting: Adult Health

## 2018-10-17 DIAGNOSIS — F410 Panic disorder [episodic paroxysmal anxiety] without agoraphobia: Secondary | ICD-10-CM

## 2018-10-17 DIAGNOSIS — B009 Herpesviral infection, unspecified: Secondary | ICD-10-CM

## 2018-10-20 ENCOUNTER — Other Ambulatory Visit: Payer: Self-pay

## 2018-10-20 ENCOUNTER — Other Ambulatory Visit (INDEPENDENT_AMBULATORY_CARE_PROVIDER_SITE_OTHER): Payer: BC Managed Care – PPO

## 2018-10-20 DIAGNOSIS — R7989 Other specified abnormal findings of blood chemistry: Secondary | ICD-10-CM

## 2018-10-20 DIAGNOSIS — L749 Eccrine sweat disorder, unspecified: Secondary | ICD-10-CM

## 2018-10-20 DIAGNOSIS — R740 Nonspecific elevation of levels of transaminase and lactic acid dehydrogenase [LDH]: Secondary | ICD-10-CM

## 2018-10-20 DIAGNOSIS — R7401 Elevation of levels of liver transaminase levels: Secondary | ICD-10-CM

## 2018-10-20 DIAGNOSIS — E876 Hypokalemia: Secondary | ICD-10-CM

## 2018-10-20 DIAGNOSIS — E782 Mixed hyperlipidemia: Secondary | ICD-10-CM

## 2018-10-20 DIAGNOSIS — I10 Essential (primary) hypertension: Secondary | ICD-10-CM

## 2018-10-20 DIAGNOSIS — E559 Vitamin D deficiency, unspecified: Secondary | ICD-10-CM

## 2018-10-20 DIAGNOSIS — E349 Endocrine disorder, unspecified: Secondary | ICD-10-CM

## 2018-10-20 DIAGNOSIS — E669 Obesity, unspecified: Secondary | ICD-10-CM

## 2018-10-21 ENCOUNTER — Other Ambulatory Visit: Payer: Self-pay

## 2018-10-21 DIAGNOSIS — F410 Panic disorder [episodic paroxysmal anxiety] without agoraphobia: Secondary | ICD-10-CM

## 2018-10-21 LAB — CBC WITH DIFFERENTIAL/PLATELET
Basophils Absolute: 0.1 10*3/uL (ref 0.0–0.2)
Basos: 1 %
EOS (ABSOLUTE): 0.2 10*3/uL (ref 0.0–0.4)
Eos: 3 %
Hematocrit: 41.3 % (ref 34.0–46.6)
Hemoglobin: 13.9 g/dL (ref 11.1–15.9)
Immature Grans (Abs): 0 10*3/uL (ref 0.0–0.1)
Immature Granulocytes: 0 %
Lymphocytes Absolute: 1.4 10*3/uL (ref 0.7–3.1)
Lymphs: 21 %
MCH: 29.9 pg (ref 26.6–33.0)
MCHC: 33.7 g/dL (ref 31.5–35.7)
MCV: 89 fL (ref 79–97)
Monocytes Absolute: 0.4 10*3/uL (ref 0.1–0.9)
Monocytes: 6 %
Neutrophils Absolute: 4.8 10*3/uL (ref 1.4–7.0)
Neutrophils: 69 %
Platelets: 232 10*3/uL (ref 150–450)
RBC: 4.65 x10E6/uL (ref 3.77–5.28)
RDW: 13.3 % (ref 11.7–15.4)
WBC: 7 10*3/uL (ref 3.4–10.8)

## 2018-10-21 LAB — COMPREHENSIVE METABOLIC PANEL
ALT: 70 IU/L — ABNORMAL HIGH (ref 0–32)
AST: 58 IU/L — ABNORMAL HIGH (ref 0–40)
Albumin/Globulin Ratio: 1.4 (ref 1.2–2.2)
Albumin: 4.2 g/dL (ref 3.8–4.8)
Alkaline Phosphatase: 126 IU/L — ABNORMAL HIGH (ref 39–117)
BUN/Creatinine Ratio: 26 (ref 12–28)
BUN: 25 mg/dL (ref 8–27)
Bilirubin Total: 0.4 mg/dL (ref 0.0–1.2)
CO2: 22 mmol/L (ref 20–29)
Calcium: 9.6 mg/dL (ref 8.7–10.3)
Chloride: 101 mmol/L (ref 96–106)
Creatinine, Ser: 0.98 mg/dL (ref 0.57–1.00)
GFR calc Af Amer: 71 mL/min/{1.73_m2} (ref >59)
GFR calc non Af Amer: 61 mL/min/{1.73_m2} (ref >59)
Globulin, Total: 3.1 g/dL (ref 1.5–4.5)
Glucose: 209 mg/dL — ABNORMAL HIGH (ref 65–99)
Potassium: 3.8 mmol/L (ref 3.5–5.2)
Sodium: 141 mmol/L (ref 134–144)
Total Protein: 7.3 g/dL (ref 6.0–8.5)

## 2018-10-21 LAB — TSH: TSH: 3.6 u[IU]/mL (ref 0.450–4.500)

## 2018-10-21 LAB — HEMOGLOBIN A1C
Est. average glucose Bld gHb Est-mCnc: 146 mg/dL
Hgb A1c MFr Bld: 6.7 % — ABNORMAL HIGH (ref 4.8–5.6)

## 2018-10-21 LAB — VITAMIN D 25 HYDROXY (VIT D DEFICIENCY, FRACTURES): Vit D, 25-Hydroxy: 54.9 ng/mL (ref 30.0–100.0)

## 2018-10-21 LAB — LIPID PANEL
Chol/HDL Ratio: 3.8 ratio (ref 0.0–4.4)
Cholesterol, Total: 137 mg/dL (ref 100–199)
HDL: 36 mg/dL — ABNORMAL LOW (ref >39)
LDL Calculated: 55 mg/dL (ref 0–99)
Triglycerides: 231 mg/dL — ABNORMAL HIGH (ref 0–149)
VLDL Cholesterol Cal: 46 mg/dL — ABNORMAL HIGH (ref 5–40)

## 2018-10-21 LAB — HIV ANTIBODY (ROUTINE TESTING W REFLEX): HIV Screen 4th Generation wRfx: NONREACTIVE

## 2018-10-21 LAB — T4, FREE: Free T4: 1.01 ng/dL (ref 0.82–1.77)

## 2018-10-21 NOTE — Telephone Encounter (Signed)
Will need office visit and discussion prior to refills given

## 2018-10-21 NOTE — Telephone Encounter (Signed)
Pharmacy sent over refill request for Alprazolam.  Medication last fill 07/15/18 for # 30 no refills.  LOV 08/28/2018 and patient has f/u on 10/23/2018.  Please review and advise. MPulliam, CMA/RT(R)

## 2018-10-23 ENCOUNTER — Ambulatory Visit (INDEPENDENT_AMBULATORY_CARE_PROVIDER_SITE_OTHER): Payer: BC Managed Care – PPO | Admitting: Family Medicine

## 2018-10-23 ENCOUNTER — Other Ambulatory Visit: Payer: Self-pay

## 2018-10-23 ENCOUNTER — Encounter: Payer: Self-pay | Admitting: Family Medicine

## 2018-10-23 VITALS — BP 134/85 | HR 95 | Temp 97.1°F | Ht 67.0 in | Wt 199.6 lb

## 2018-10-23 DIAGNOSIS — E1169 Type 2 diabetes mellitus with other specified complication: Secondary | ICD-10-CM | POA: Diagnosis not present

## 2018-10-23 DIAGNOSIS — F411 Generalized anxiety disorder: Secondary | ICD-10-CM

## 2018-10-23 DIAGNOSIS — F39 Unspecified mood [affective] disorder: Secondary | ICD-10-CM

## 2018-10-23 DIAGNOSIS — I10 Essential (primary) hypertension: Secondary | ICD-10-CM

## 2018-10-23 DIAGNOSIS — Z719 Counseling, unspecified: Secondary | ICD-10-CM

## 2018-10-23 DIAGNOSIS — F410 Panic disorder [episodic paroxysmal anxiety] without agoraphobia: Secondary | ICD-10-CM

## 2018-10-23 DIAGNOSIS — E782 Mixed hyperlipidemia: Secondary | ICD-10-CM

## 2018-10-23 DIAGNOSIS — E119 Type 2 diabetes mellitus without complications: Secondary | ICD-10-CM | POA: Insufficient documentation

## 2018-10-23 DIAGNOSIS — E1159 Type 2 diabetes mellitus with other circulatory complications: Secondary | ICD-10-CM | POA: Diagnosis not present

## 2018-10-23 DIAGNOSIS — I152 Hypertension secondary to endocrine disorders: Secondary | ICD-10-CM

## 2018-10-23 DIAGNOSIS — F430 Acute stress reaction: Secondary | ICD-10-CM

## 2018-10-23 DIAGNOSIS — R61 Generalized hyperhidrosis: Secondary | ICD-10-CM

## 2018-10-23 DIAGNOSIS — L749 Eccrine sweat disorder, unspecified: Secondary | ICD-10-CM

## 2018-10-23 MED ORDER — ALPRAZOLAM 1 MG PO TABS: 1.0000 mg | ORAL_TABLET | ORAL | 0 refills | Status: DC | PRN

## 2018-10-23 NOTE — Progress Notes (Signed)
Telehealth office visit note for Mercedes Webb, D.O- at Primary Care at [facility]   I connected with current patient today and verified that I am speaking with the correct person using two identifiers.    Location of the patient: Home  Location of the provider: Office Only the patient (+/- their family members at pt's discretion) and myself were participating in the encounter    - This visit type was conducted due to national recommendations for restrictions regarding the COVID-19 Pandemic (e.g. social distancing) in an effort to limit this patient's exposure and mitigate transmission in our community.  This format is felt to be most appropriate for this patient at this time.   - The patient did not have access to video technology or had technical difficulties with video requiring transitioning to audio format only. - No physical exam could be performed with this format, beyond that communicated to [place] by the patient/ family members as noted.   - Additionally my office staff/ schedulers discussed with the patient that there may be a monetary charge related to this service, depending on their medical insurance.   The patient expressed understanding, and agreed to proceed.       History of Present Illness:  Pt saw Dr Loanne Drilling for Endo referral about her concerns for hormonal abn- she was not happy about it.      Creatinine, Urine mg/day-CATEUR 1.71 0.50 - 2.15 g/24 h  5 HIAA w/Creatinine, 24 hr.     Status: None   Collection Time: 09/15/18 11:38 AM  Result Value Ref Range   Volume, Urine-VMAUR 2,600 mL   5-HIAA 3.4 <=6.0 mg/24 h    Comment: . This specimen was submitted with a pH greater than 6.0. Optimum pH for this assay is 1.0-6.0. Improper preservation may compromise the validity of the assay. . . This test was developed and its analytical performance characteristics have been determined by [place], [place]. It has not been cleared  or approved by the U.S. Food and Drug Administration. This assay has been validated pursuant to the CLIA regulations and is used for clinical purposes. .    Creatinine 1.74 0.50 - 2.15 g/24 h  HIV Antibody (routine testing w rflx)     Status: None   Collection Time: 10/20/18  9:20 AM  Result Value Ref Range   HIV Screen 4th Generation wRfx Non Reactive Non Reactive  CBC with Differential/Platelet     Status: None   Collection Time: 10/20/18  9:20 AM  Result Value Ref Range   WBC 7.0 3.4 - 10.8 x10E3/uL   RBC 4.65 3.77 - 5.28 x10E6/uL   Hemoglobin 13.9 11.1 - 15.9 g/dL   Hematocrit 41.3 34.0 - 46.6 %   MCV 89 79 - 97 fL   MCH 29.9 26.6 - 33.0 pg   MCHC 33.7 31.5 - 35.7 g/dL   RDW 13.3 11.7 - 15.4 %   Platelets 232 150 - 450 x10E3/uL   Neutrophils 69 Not Estab. %   Lymphs 21 Not Estab. %   Monocytes 6 Not Estab. %   Eos 3 Not Estab. %   Basos 1 Not Estab. %   Neutrophils Absolute 4.8 1.4 - 7.0 x10E3/uL   Lymphocytes Absolute 1.4 0.7 - 3.1 x10E3/uL   Monocytes Absolute 0.4 0.1 - 0.9 x10E3/uL   EOS (ABSOLUTE) 0.2 0.0 - 0.4 x10E3/uL   Basophils Absolute 0.1 0.0 - 0.2 x10E3/uL   Immature Granulocytes 0 Not Estab. %  Immature Grans (Abs) 0.0 0.0 - 0.1 x10E3/uL  Comprehensive metabolic panel     Status: Abnormal   Collection Time: 10/20/18  9:20 AM  Result Value Ref Range   Glucose 209 (H) 65 - 99 mg/dL   BUN 25 8 - 27 mg/dL   Creatinine, Ser 0.98 0.57 - 1.00 mg/dL   GFR calc non Af Amer 61 >59 mL/min/1.73   GFR calc Af Amer 71 >59 mL/min/1.73   BUN/Creatinine Ratio 26 12 - 28   Sodium 141 134 - 144 mmol/L   Potassium 3.8 3.5 - 5.2 mmol/L   Chloride 101 96 - 106 mmol/L   CO2 22 20 - 29 mmol/L   Calcium 9.6 8.7 - 10.3 mg/dL   Total Protein 7.3 6.0 - 8.5 g/dL   Albumin 4.2 3.8 - 4.8 g/dL   Globulin, Total 3.1 1.5 - 4.5 g/dL   Albumin/Globulin Ratio 1.4 1.2 - 2.2   Bilirubin Total 0.4 0.0 - 1.2 mg/dL   Alkaline Phosphatase 126 (H) 39 - 117 IU/L   AST 58 (H) 0 - 40 IU/L    ALT 70 (H) 0 - 32 IU/L  Hemoglobin A1c     Status: Abnormal   Collection Time: 10/20/18  9:20 AM  Result Value Ref Range   Hgb A1c MFr Bld 6.7 (H) 4.8 - 5.6 %    Comment:          Prediabetes: 5.7 - 6.4          Diabetes: >6.4          Glycemic control for adults with diabetes: <7.0    Est. average glucose Bld gHb Est-mCnc 146 mg/dL  Lipid panel     Status: Abnormal   Collection Time: 10/20/18  9:20 AM  Result Value Ref Range   Cholesterol, Total 137 100 - 199 mg/dL   Triglycerides 231 (H) 0 - 149 mg/dL   HDL 36 (L) >39 mg/dL   VLDL Cholesterol Cal 46 (H) 5 - 40 mg/dL   LDL Calculated 55 0 - 99 mg/dL   Chol/HDL Ratio 3.8 0.0 - 4.4 ratio    Comment:                                   T. Chol/HDL Ratio                                             Men  Women                               1/2 Avg.Risk  3.4    3.3                                   Avg.Risk  5.0    4.4                                2X Avg.Risk  9.6    7.1                                3X Avg.Risk  23.4   11.0   VITAMIN D 25 Hydroxy (Vit-D Deficiency, Fractures)     Status: None   Collection Time: 10/20/18  9:20 AM  Result Value Ref Range   Vit D, 25-Hydroxy 54.9 30.0 - 100.0 ng/mL    Comment: Vitamin D deficiency has been defined by the [place] practice guideline as a level of serum 25-OH vitamin D less than 20 ng/mL (1,2). The Endocrine Society went on to further define vitamin D insufficiency as a level between 21 and 29 ng/mL (2). 1. IOM (Institute of Medicine). 2010. Dietary reference    intakes for calcium and D. [place]: The    [employer]. 2. Holick MF, Binkley [place], Bischoff-Ferrari HA, et al.    Evaluation, treatment, and prevention of vitamin D    deficiency: an Endocrine Society clinical practice    guideline. JCEM. 2011 Jul; 96(7):1911-30.   TSH     Status: None   Collection Time: 10/20/18  9:20 AM  Result Value Ref Range   TSH 3.600 0.450 -  4.500 uIU/mL  T4, free     Status: None   Collection Time: 10/20/18  9:20 AM  Result Value Ref Range   Free T4 1.01 0.82 - 1.77 ng/dL    Mood:  Not taking buspar at all.  Made her feel wiped out and groggy and druged out.  Pt is tearful and crying on phone from frustration.    -      Impression and Recommendations:    1. Type 2 diabetes mellitus with other specified complication, without long-term current use of insulin ([place])   2. Mixed diabetic hyperlipidemia associated with type 2 diabetes mellitus ([place])   3. Hypertension associated with diabetes ([place])   4. Mood disorder ([place])- mixed anxiety and depression   5. Generalized anxiety disorder   6. Acute stress reaction   7. Panic attack   8. GAD (generalized anxiety disorder)   9. Health education/counseling   10. Sweating abnormality   11. Secondary hyperhidrosis- not focal in nature but primarily from waist up is the worst     -New onset diabetes.  Extensively discussed with patient how she needs to change her diet, increased exercise and weight loss.  Explained her pathophysiology of disease and how excess carbohydrates and sugars can cause this as well as excess adipose tissue on the body. -Patient declined going to a diabetic nutrition specialist today. -Explained her will need to recheck in 3 months-recheck A1c. -Also patient is established with endocrinology and she is going to follow-up with Dr. Cruzita Lederer a in the near future.  Discussed with patient she can mention this to Dr. Cathren Laine and if they wish to follow her for this, they are more than welcome to.  Patient remains on Lipitor.  LDL at 55 and at goal.  Triglycerides elevated likely due to the increased sugar intake etc.  HDL went down since patient has been more sedentary, stressed etc.  Reminded patient of dietary lifestyle changes needed to help control these conditions.  Blood pressure at goal today.  She is on triamterene hydrochlorothiazide and since the new onset  diabetes, we will consider starting an ACE inhibitor or ARB in the near future.  Mood disorder: Patient is extremely wound up today.  I was on the phone for with her for almost 35 minutes.  She declines going on an SSRI or SNRI at this time.  She last had a refill of Xanax back in  March and prior to that in September and I explained I will give her 1 more refill but hence, she cannot have more than 230-day supplies in a years time.  Or else she will have to go elsewhere to get them. -Patient will let me know once she discusses with endocrinology and all, if she would like to try a medicine for her mood.  She is been on several in the past by her GYN and they will cause increased sweating so patient is very opposed to starting 1 -Discussed with patient counseling, meditation, exercise and prudent diet to help improve her stress levels. -She declined referral to a counselor today.  - As part of my medical decision making, I reviewed the following data within the [place] History obtained from pt /family, CMA notes reviewed and incorporated if applicable, Labs reviewed, Radiograph/ tests reviewed if applicable and OV notes from prior OV's with me, as well as other specialists she/he has seen since seeing me last, were all reviewed and used in my medical decision making process today.   - Additionally, discussion had with patient regarding txmnt plan, and their biases/concerns about that plan were used in my medical decision making today.   - The patient agreed with the plan and demonstrated an understanding of the instructions.   No barriers to understanding were identified.   - Red flag symptoms and signs discussed in detail.  Patient expressed understanding regarding what to do in case of emergency\ urgent symptoms.  The patient was advised to call back or seek an in-person evaluation if the symptoms worsen or if the condition fails to improve as anticipated.   No follow-ups on file.      No orders of the defined types were placed in this encounter.   Meds ordered this encounter  Medications   ALPRAZolam (XANAX) 1 MG tablet    Sig: Take 1 tablet (1 mg total) by mouth as needed for anxiety.    Dispense:  30 tablet    Refill:  0    Medications Discontinued During This Encounter  Medication Reason   ALPRAZolam (XANAX) 1 MG tablet Reorder      I provided 35 minutes of non-face-to-face time during this encounter,with over 50% of the time in direct counseling on patients medical conditions/ medical concerns.  Additional time was spent with charting and coordination of care after the actual visit commenced.   Note:  This note was prepared with assistance of Dragon voice recognition software. Occasional wrong-word or sound-a-like substitutions may have occurred due to the inherent limitations of voice recognition software.  Mercedes Dance, DO 10/23/2018 10:21 AM    Patient Care Team    Relationship Specialty Notifications Start End  Mercedes Dance, DO PCP - General Family Medicine  11/26/16   Defrancesco, Alanda Slim, MD Consulting Physician Obstetrics and Gynecology  11/12/16   Joylene Igo, CNM Midwife Obstetrics and Gynecology  11/12/16   Danella Sensing, MD Consulting Physician Dermatology  11/12/16   Richmond Campbell, MD Consulting Physician Gastroenterology  11/26/16   Carol Ada, MD Consulting Physician Gastroenterology  11/26/16   Keene Breath., MD  Ophthalmology  11/26/16   Joylene Igo, CNM Midwife Obstetrics and Gynecology  11/26/16    Comment: Prescribes her Seroquel     -Vitals obtained; medications/ allergies reconciled;  personal medical, social, Sx etc.histories were updated by CMA, reviewed by me and are reflected in chart   Patient Active Problem List   Diagnosis Date Noted  Obesity, Class I, BMI 30-34.9 06/10/2017    Priority: High   Primary insomnia 11/12/2016    Priority: High   Adjustment disorder with mixed anxiety and  depressed mood 11/12/2016    Priority: High   Elevated cholesterol with elevated triglycerides 09/28/2016    Priority: High   Hypertension associated with diabetes ([place]) 01/31/2016    Priority: High   Generalized anxiety disorder 01/31/2016    Priority: High   Postmenopausal- 30 yrs ago TAH "yrs ago" 11/12/2016    Priority: Medium   Hormone imbalance- txed by gyn 11/12/2016    Priority: Medium   Mixed hyperlipidemia 11/12/2016    Priority: Medium   Hypokalemia 02/01/2016    Priority: Medium   Vitamin D deficiency 09/28/2016    Priority: Low   Status post abdominal hysterectomy 12/23/2014    Priority: Low   Mixed diabetic hyperlipidemia associated with type 2 diabetes mellitus ([place]) 10/23/2018   Diabetes mellitus ([place]) 10/23/2018   Secondary hyperhidrosis- not focal in nature but primarily from waist up is the worst 10/23/2018   Acute stress reaction 08/20/2018   Excess, secretion, sweat 08/20/2018   Low serum progesterone 05/30/2018   Mood disorder ([place])- mixed anxiety and depression 01/20/2018   Panic attack 01/20/2018   Excessive sweating- likely due to hormonal 11/11/2017   Inactivity 11/11/2017   Elevated serum creatinine 09/04/2017   Elevated ALT measurement 09/04/2017   Acute biliary pancreatitis 01/31/2016   Overweight 12/23/2014   Surgical menopause 12/23/2014   History of cervical dysplasia 12/23/2014   SUI (stress urinary incontinence, female) 12/23/2014     Current Meds  Medication Sig   ALPRAZolam (XANAX) 1 MG tablet Take 1 tablet (1 mg total) by mouth as needed for anxiety.   Ascorbic Acid (VITAMIN C) 1000 MG tablet Take 1,000 mg by mouth daily.   aspirin EC 81 MG tablet Take 1 tablet (81 mg total) by mouth daily.   atorvastatin (LIPITOR) 10 MG tablet Take 1 tablet (10 mg total) by mouth at bedtime.   Calcium Carbonate (CALTRATE 600 PO) Take 1 tablet by mouth daily.   estradiol (ESTRACE) 0.5 MG tablet Take 1 tablet (0.5 mg  total) by mouth daily.   Multiple Vitamins-Minerals (CENTRUM SILVER PO) Take 1 tablet by mouth daily.   omega-3 acid ethyl esters (LOVAZA) 1 g capsule Take 2 capsules (2 g total) by mouth 2 (two) times daily.   potassium chloride (K-DUR,KLOR-CON) 10 MEQ tablet Take 1 tablet (10 mEq total) by mouth daily.   progesterone (PROMETRIUM) 200 MG capsule Take 1 capsule (200 mg total) by mouth daily.   triamterene-hydrochlorothiazide (MAXZIDE-25) 37.5-25 MG tablet Take 1 tablet by mouth daily.   valACYclovir (VALTREX) 500 MG tablet TAKE 1 TABLET (500 MG TOTAL) BY MOUTH DAILY.   Vitamin D, Ergocalciferol, (DRISDOL) 50000 units CAPS capsule Take 1 capsule (50,000 Units total) by mouth every 7 (seven) days.   zolpidem (AMBIEN CR) 12.5 MG CR tablet TAKE 1 TABLET BY MOUTH AT BEDTIME AS NEEDED FOR SLEEP.   [DISCONTINUED] ALPRAZolam (XANAX) 1 MG tablet TAKE 1 TABLET BY MOUTH AS NEEDED (PANIC ONLY)     Allergies:  Allergies  Allergen Reactions   Prozac [Fluoxetine Hcl] Other (See Comments)    sweating     ROS:  See above HPI for pertinent positives and negatives   Objective:   Blood pressure 134/85, pulse 95, temperature (!) 97.1 F (36.2 C), height 5\' 7"  (1.702 m), weight 199 lb 9.6 oz (90.5 kg).  (if some vitals are  omitted, this means that patient was UNABLE to obtain them even though they were asked to get them prior to OV today.  They were asked to call us at their earliest convenience with these once obtained. )  General: A & O * 3; sounds in no acute distress; in usual state of health.  Skin: Pt confirms warm and dry extremities and pink fingertips HEENT: Pt confirms lips non-cyanotic Chest: Patient confirms normal chest excursion and movement Respiratory: speaking in full sentences, no conversational dyspnea; patient confirms no use of accessory muscles Psych: insight appears good, mood- appears full

## 2018-11-03 ENCOUNTER — Other Ambulatory Visit: Payer: Self-pay | Admitting: Family Medicine

## 2018-11-03 DIAGNOSIS — E876 Hypokalemia: Secondary | ICD-10-CM

## 2018-11-04 ENCOUNTER — Telehealth: Payer: Self-pay | Admitting: Family Medicine

## 2018-11-04 NOTE — Telephone Encounter (Signed)
Patient states Mercedes Webb left her a message to call office.  --glh

## 2018-12-18 ENCOUNTER — Other Ambulatory Visit: Payer: Self-pay | Admitting: Family Medicine

## 2018-12-18 DIAGNOSIS — F39 Unspecified mood [affective] disorder: Secondary | ICD-10-CM

## 2018-12-18 DIAGNOSIS — F5101 Primary insomnia: Secondary | ICD-10-CM

## 2019-01-20 ENCOUNTER — Other Ambulatory Visit: Payer: Self-pay | Admitting: Family Medicine

## 2019-01-20 DIAGNOSIS — E669 Obesity, unspecified: Secondary | ICD-10-CM

## 2019-01-20 DIAGNOSIS — I10 Essential (primary) hypertension: Secondary | ICD-10-CM

## 2019-01-26 ENCOUNTER — Other Ambulatory Visit: Payer: Self-pay | Admitting: Family Medicine

## 2019-01-26 DIAGNOSIS — E559 Vitamin D deficiency, unspecified: Secondary | ICD-10-CM

## 2019-01-26 DIAGNOSIS — I10 Essential (primary) hypertension: Secondary | ICD-10-CM

## 2019-01-26 DIAGNOSIS — E782 Mixed hyperlipidemia: Secondary | ICD-10-CM

## 2019-02-24 ENCOUNTER — Other Ambulatory Visit: Payer: Self-pay

## 2019-02-24 ENCOUNTER — Ambulatory Visit (INDEPENDENT_AMBULATORY_CARE_PROVIDER_SITE_OTHER): Payer: BC Managed Care – PPO

## 2019-02-24 DIAGNOSIS — Z23 Encounter for immunization: Secondary | ICD-10-CM | POA: Diagnosis not present

## 2019-02-24 NOTE — Progress Notes (Signed)
Pt here for influenza vaccine.  Screening questionnaire reviewed, VIS provided to patient, and any/all patient questions answered.  T. Nelson, CMA  

## 2019-03-05 IMAGING — MG DIGITAL SCREENING BILATERAL MAMMOGRAM WITH TOMO AND CAD
8 series · 8 of 24 positions shown · non-contrast
Comparison: Previous exam(s).

CLINICAL DATA: Screening.

EXAM:
DIGITAL SCREENING BILATERAL MAMMOGRAM WITH TOMO AND CAD

[L CC synth-2D]
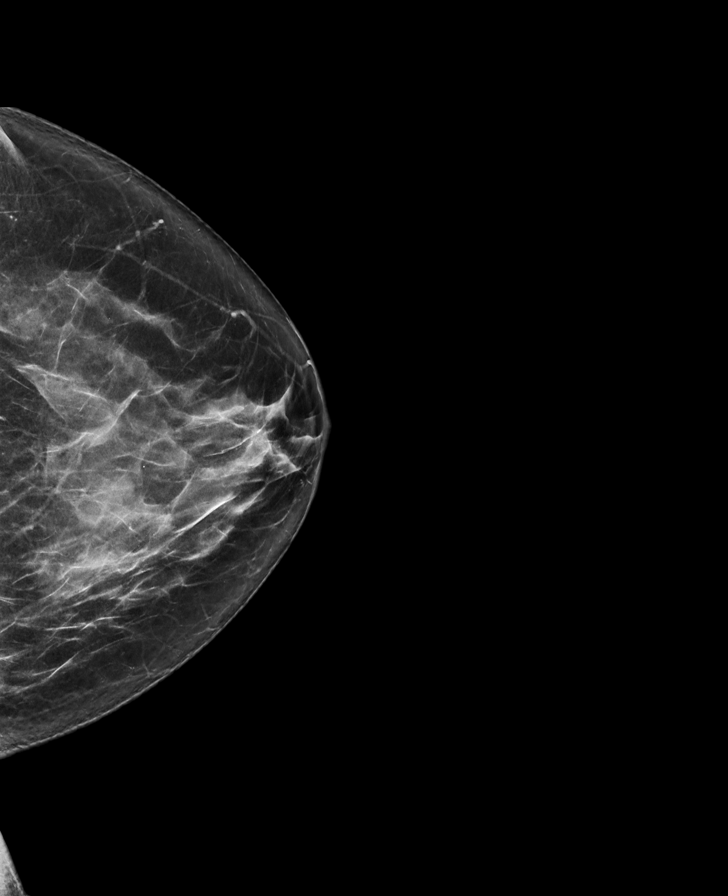

[R MLO synth-2D]
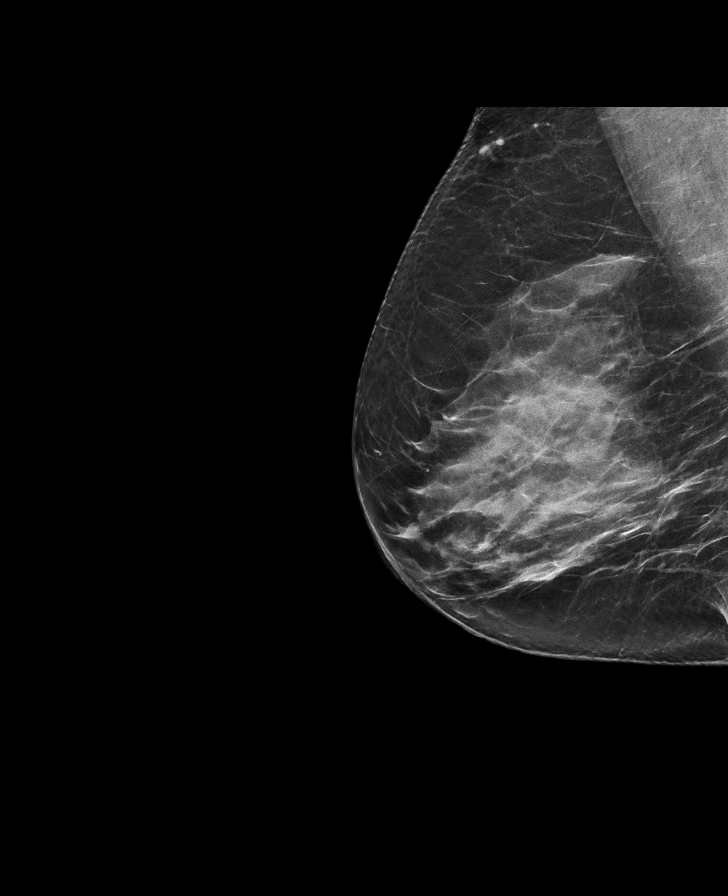

[L MLO synth-2D]
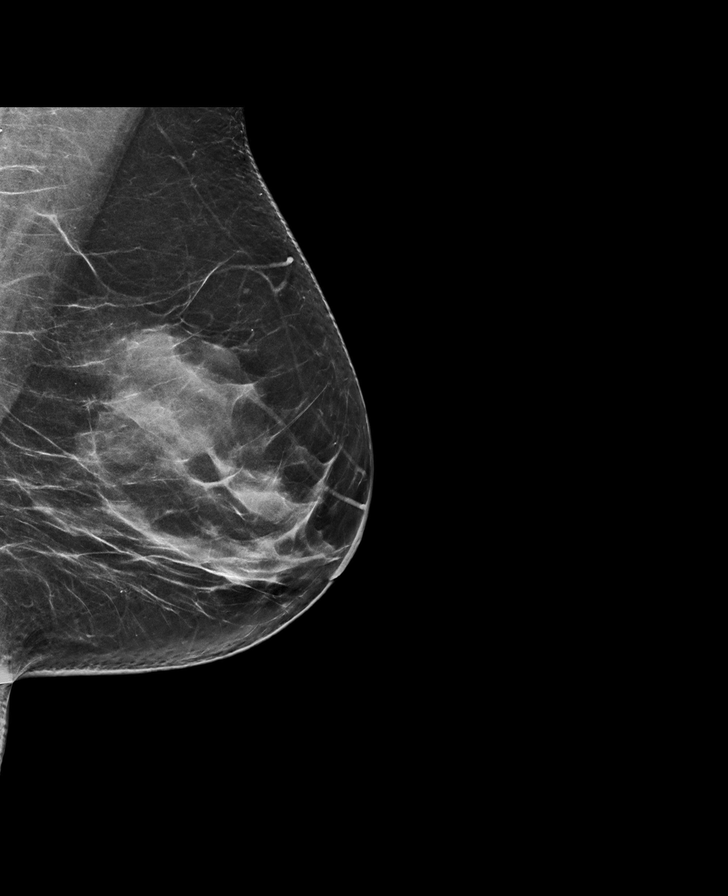

[R CC synth-2D]
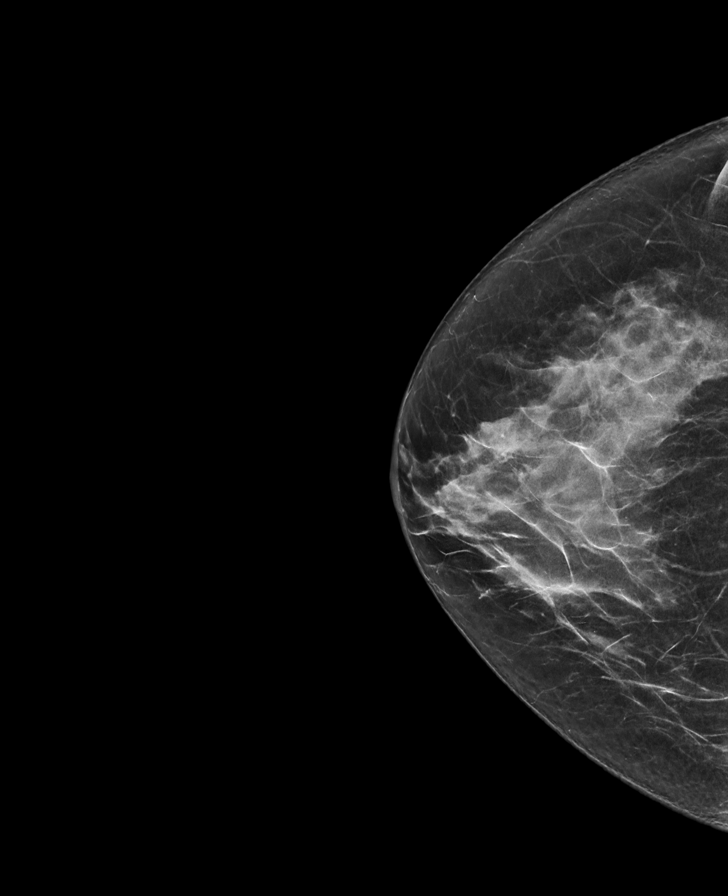

[R CC tomo · tomo slice 35/70.0]
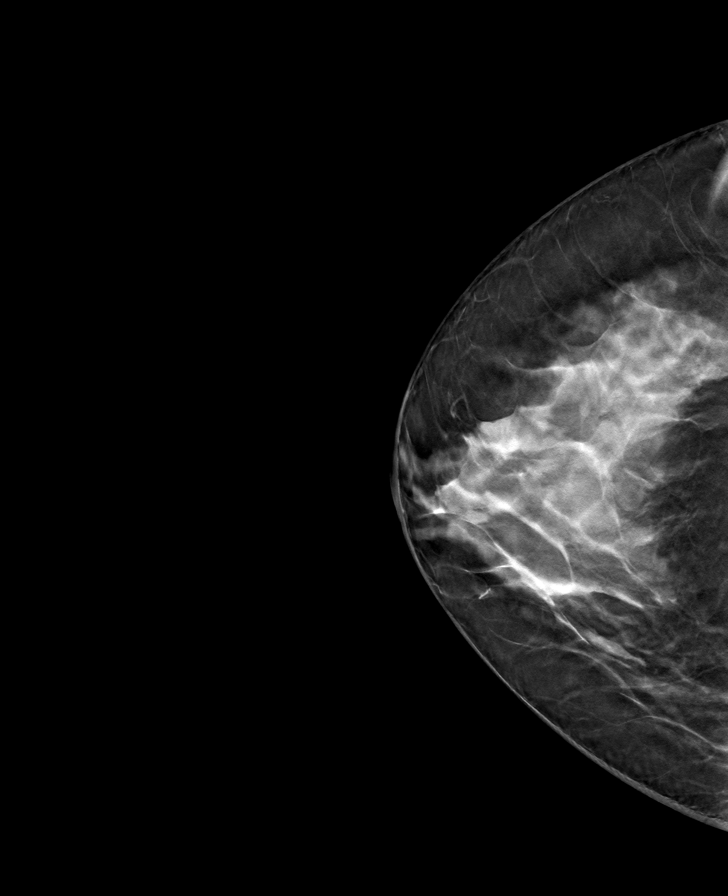

[L CC tomo · tomo slice 37/73.0]
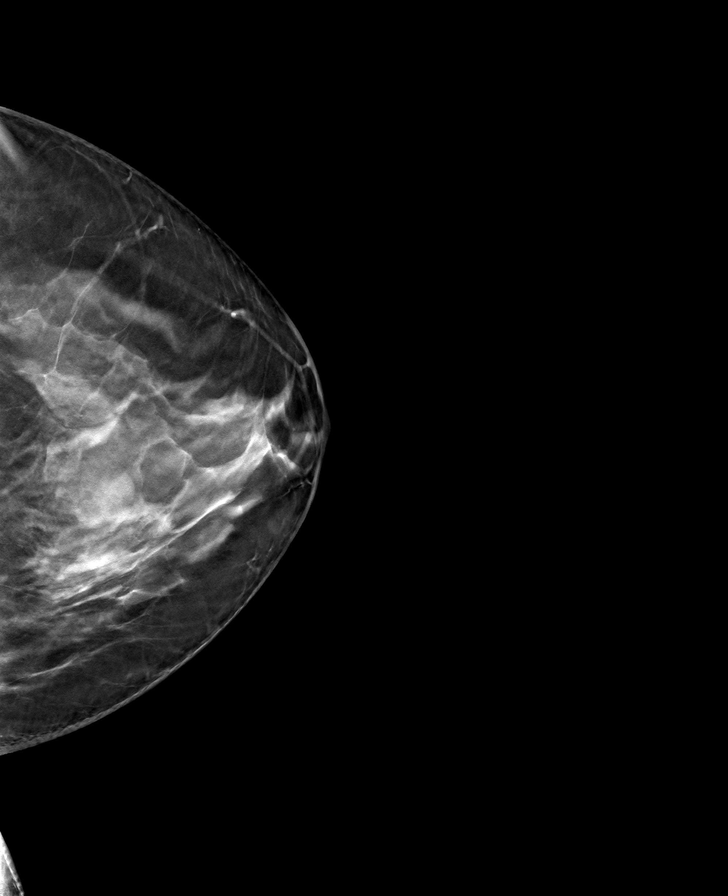

[R MLO tomo · tomo slice 40/79.0]
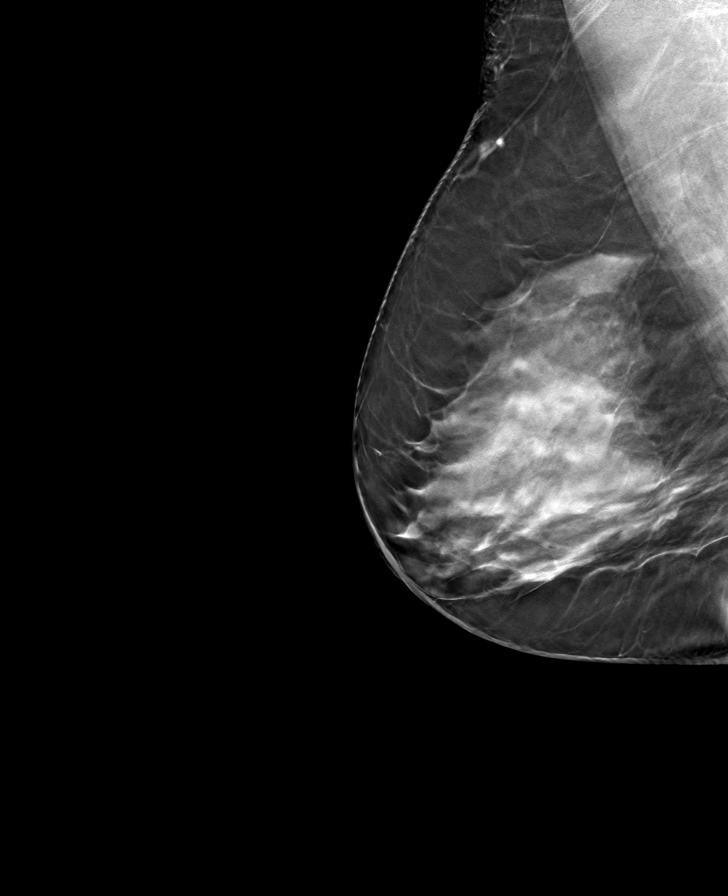

[L MLO tomo · tomo slice 38/75.0]
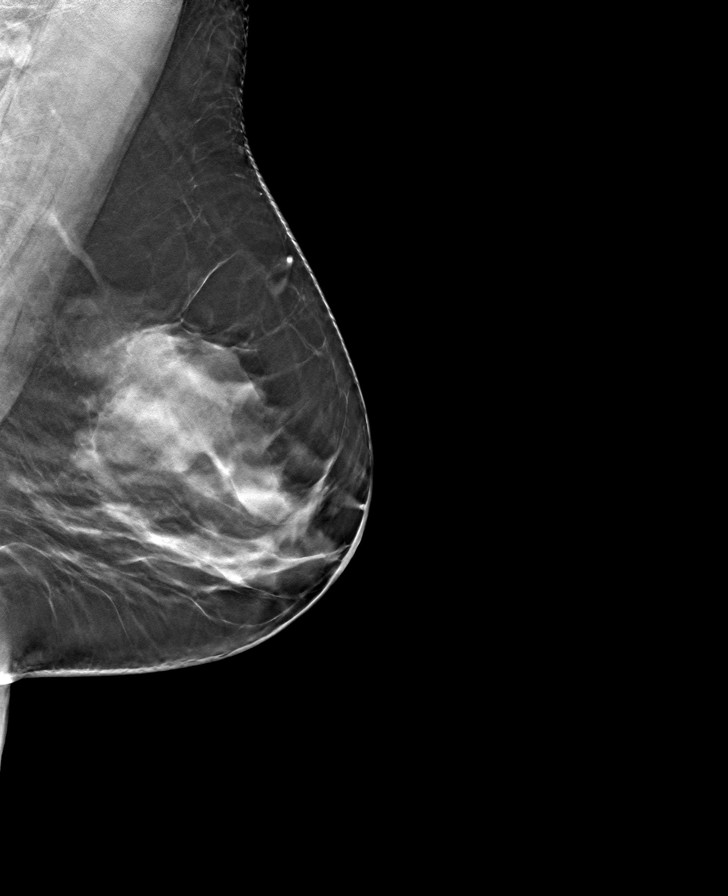

[8 of 24 positions shown; findings below may reference images not displayed]

ACR Breast Density Category c: The breast tissue is heterogeneously
dense, which may obscure small masses.
FINDINGS: There are no findings suspicious for malignancy. Images were
processed with CAD.
IMPRESSION: No mammographic evidence of malignancy. A result letter of this
screening mammogram will be mailed directly to the patient.

RECOMMENDATION:
Screening mammogram in one year. (Code:[id number])

BI-RADS CATEGORY  1: Negative.

## 2019-03-17 ENCOUNTER — Telehealth: Payer: Self-pay | Admitting: Family Medicine

## 2019-03-17 DIAGNOSIS — F39 Unspecified mood [affective] disorder: Secondary | ICD-10-CM

## 2019-03-17 DIAGNOSIS — F5101 Primary insomnia: Secondary | ICD-10-CM

## 2019-03-17 MED ORDER — ZOLPIDEM TARTRATE ER 12.5 MG PO TBCR: 12.5000 mg | EXTENDED_RELEASE_TABLET | Freq: Every evening | ORAL | 0 refills | Status: DC | PRN

## 2019-03-17 NOTE — Telephone Encounter (Signed)
Patient requesting that medication be sent to [facility] on [address] because her insurance has changed and she can not afford script at [facility]. I called [facility] to cancel script but patient didn't have any refills there.

## 2019-03-17 NOTE — Telephone Encounter (Signed)
Per patient she was notified by Ins Co they " DO NOT COVER" this Rx :   zolpidem (AMBIEN CR) 12.5 MG CR tablet [id number]   Order Details Dose, Route, Frequency: As Directed  Dispense Quantity: 90 tablet Refills: 0 Fills remaining: --        Sig: TAKE 1 TABLET BY MOUTH AT BEDTIME AS NEEDED FOR SLEEP.     ---Patient is requesting a paper script so she can take it to pharmacy(of her choice) to fill when it's needed.   ---Pt has retired as of 03/20/2019 (turning 63yrs old).  Pt has selected [employer] insurance cov[employer] & has been given list of meds they cover.  --Forwarding request to medical asst for review approval of provider.  --glh

## 2019-03-18 ENCOUNTER — Other Ambulatory Visit: Payer: Self-pay | Admitting: Family Medicine

## 2019-03-18 DIAGNOSIS — F5101 Primary insomnia: Secondary | ICD-10-CM

## 2019-03-18 DIAGNOSIS — F39 Unspecified mood [affective] disorder: Secondary | ICD-10-CM

## 2019-03-18 MED ORDER — ZOLPIDEM TARTRATE ER 12.5 MG PO TBCR: 12.5000 mg | EXTENDED_RELEASE_TABLET | Freq: Every evening | ORAL | 1 refills | Status: DC | PRN

## 2019-03-18 NOTE — Telephone Encounter (Signed)
Thanks for letting me know.  I just sent that in for her to the [facility]  per her request.

## 2019-03-18 NOTE — Progress Notes (Signed)
Ambien refilled per patient request.  Please see note created 1117 at 4:49 PM by Anders Simmonds

## 2019-04-18 ENCOUNTER — Other Ambulatory Visit: Payer: Self-pay | Admitting: Family Medicine

## 2019-04-18 DIAGNOSIS — B009 Herpesviral infection, unspecified: Secondary | ICD-10-CM

## 2019-04-21 NOTE — Telephone Encounter (Signed)
Patient was last seen 10/23/2018 and per the note, see below, she is due for 44-month follow-up due to new onset diabetes.  She is overdue.  Please call patient for an appointment and let her know we will only give her a 30-day supply of her chronic meds until she is seen.  This is very important due to her new onset of diabetes diagnosis   "-New onset diabetes.  Extensively discussed with patient how she needs to change her diet, increased exercise and weight loss.  Explained her pathophysiology of disease and how excess carbohydrates and sugars can cause this as well as excess adipose tissue on the body. -Patient declined going to a diabetic nutrition specialist today. -Explained her will need to recheck in 3 months-recheck A1c."

## 2019-04-21 NOTE — Telephone Encounter (Signed)
Last OV in 09/2018.  No follow up documented.  I am unsure of appropriateness of refill.  Please review and refill if appropriate.  [name], CMA

## 2019-04-27 ENCOUNTER — Other Ambulatory Visit: Payer: Self-pay | Admitting: Family Medicine

## 2019-04-27 DIAGNOSIS — E876 Hypokalemia: Secondary | ICD-10-CM

## 2019-04-27 NOTE — Telephone Encounter (Signed)
Patient has not been seen since 10/23/18 with no indication noted for follow up. Patient is taking potassium depleting diuretic Triamterene-HCTZ 37.5mg -25mg . Patient has not had potassium lab checked since 01/14/2017. Please advise if refill appropriate. AS, CMA

## 2019-04-28 ENCOUNTER — Telehealth: Payer: Self-pay | Admitting: Family Medicine

## 2019-04-28 ENCOUNTER — Other Ambulatory Visit: Payer: Self-pay

## 2019-04-28 ENCOUNTER — Ambulatory Visit (INDEPENDENT_AMBULATORY_CARE_PROVIDER_SITE_OTHER): Payer: Medicare Other | Admitting: Family Medicine

## 2019-04-28 ENCOUNTER — Encounter: Payer: Self-pay | Admitting: Family Medicine

## 2019-04-28 DIAGNOSIS — E1169 Type 2 diabetes mellitus with other specified complication: Secondary | ICD-10-CM

## 2019-04-28 DIAGNOSIS — T148XXA Other injury of unspecified body region, initial encounter: Secondary | ICD-10-CM

## 2019-04-28 DIAGNOSIS — M545 Low back pain, unspecified: Secondary | ICD-10-CM

## 2019-04-28 MED ORDER — CYCLOBENZAPRINE HCL 10 MG PO TABS: 10.0000 mg | ORAL_TABLET | Freq: Three times a day (TID) | ORAL | 0 refills | Status: DC | PRN

## 2019-04-28 MED ORDER — NAPROXEN 500 MG PO TABS: 500.0000 mg | ORAL_TABLET | Freq: Two times a day (BID) | ORAL | 0 refills | Status: DC

## 2019-04-28 NOTE — Telephone Encounter (Signed)
Patient called seeking appt w/ PCP (none available) offered NP/Katy D 4:00 Acute spot  --Per patient unsure if if pulled muscle but having severe Lower back pain-- Please call and advise if she needs to have X-ray or go to Urgent Care rather than do a Telehealth w/Katy   -ph# [phone]  --glh

## 2019-04-28 NOTE — Telephone Encounter (Signed)
Please call patient and see if she can do the 9am time slot that is available with Dr. Raliegh Scarlet

## 2019-04-28 NOTE — Telephone Encounter (Signed)
Patient's potassium was recently checked on 10/20/2018 and found to be 3.8 and within normal limits.  No medication dose changes have been made since then.   It is okay to refill #90 with 1 refill since we have been monitoring it as instructed.

## 2019-04-28 NOTE — Progress Notes (Signed)
Virtual / live video office visit note for [employer], D.O- Primary Care Physician at [facility]   I connected with current patient today and beyond visually recognizing the correct individual, I verified that I am speaking with the correct person using two identifiers.  . Location of the patient: Home . Location of the provider: Office Only the patient (+/- their family members at pt's discretion) and myself were participating in the encounter    - This visit type was conducted due to national recommendations for restrictions regarding the COVID-19 Pandemic (e.g. social distancing) in an effort to limit this patient's exposure and mitigate transmission in our community.  This format is felt to be most appropriate for this patient at this time.   - The patient did have access to video technology today  - No physical exam could be performed with this format, beyond that communicated to [place] by the patient/ family members as noted.   - Additionally my office staff/ schedulers discussed with the patient that there may be a monetary charge related to this service, depending on patient's medical insurance.   The patient expressed understanding, and agreed to proceed.      History of Present Illness: Back Pain   I, [name], am serving as scribe for Dr. Mellody Dance.  Notes she's unsure what happened; "if I pulled a muscle, I'm not sure when I did it."  Notes she hasn't entertained, and even recently cancelled plans with her daughter because she was feeling sick.  Thought she was maybe getting sick on Friday, four days ago, because she was feeling bad, "but Saturday it's like it went right into the lower back."    She fought the pain over the weekend, but "the last two nights, it's hard for me to sit, hard for me to lay."  To relieve the pain, she has tried moist heat, dry heat, and cold pack, has been taking Alleve OTC back and muscle, put bi freeze and mineral ice on it,  and has tried lots of hot showers.  Says "it's almost like in the mornings ,when I toss all night, I get in the shower at five AM and just stand in there with the water pounding."  The pain is located in the lower back, and more on the right side.  The pain doesn't radiate; denies radiation to her knee or beyond.  Notes "mostly in the lower back."  Her right side typically hurts more than the left.  Notes "I'm game for anything, I just need some relief."  Denies loss of bowel or bladder.  Denies loss of function in her legs.  - Endocrine Health Notes she went recently for some biochemical hormone testing.  Not taking estrogen or progesterone right now.  Notes "I'll fill you in on that when I next see you, it's not a big thing."    Depression screen [facility] 2/9 04/28/2019 10/23/2018 08/20/2018 04/24/2018 01/20/2018  Decreased Interest 0 0 1 1 1   Down, Depressed, Hopeless 1 0 1 1 1   PHQ - 2 Score 1 0 2 2 2   Altered sleeping 2 1 0 0 1  Tired, decreased energy 2 3 1 1 1   Change in appetite 1 1 1 1 1   Feeling bad or failure about yourself  0 2 1 0 1  Trouble concentrating 0 1 0 0 1  Moving slowly or fidgety/restless 0 0 0 0 0  Suicidal thoughts 0 0 0 0 0  PHQ-9 Score 6 8 5 4 7   Difficult doing work/chores Not difficult at all Somewhat difficult - Not difficult at all Not difficult at all  Some recent data might be hidden    GAD 7 : Generalized Anxiety Score 04/28/2019 10/23/2018 08/20/2018 04/24/2018  Nervous, Anxious, on Edge 1 2 1 1   Control/stop worrying 0 2 0 0  Worry too much - different things 1 2 0 1  Trouble relaxing 1 1 1 1   Restless 1 0 0 0  Easily annoyed or irritable 1 0 2 0  Afraid - awful might happen 0 0 0 0  Total GAD 7 Score 5 7 4 3   Anxiety Difficulty Not difficult at all Very difficult - -     Impression and Recommendations:    1. Acute right-sided low back pain without sciatica   2. Muscle strain   3. Type 2 diabetes mellitus with other specified complication,  without long-term current use of insulin (HCC)    Acute Right>er Left -Sided Low Back Pain w/out Sciatica, & Muscle Strain - Given the patient's more axial pain, discussed beginning with prudent conservative treatment using NSAID's, muscle relaxers, and physical therapy.  - Discussed that if the patient develops radicular pain, which she does not currently report, further evaluation and different treatment will be indicated.  - Patient has taken muscle relaxers in the past, a long time ago.  Per patient, "I have a really high tolerance."  - Discussed risks, benefits, and alternatives to muscle relaxers. - Flexeril prescribed today.  See med list. - Told patient she may take this every eight hours, and to watch for S-E of drowsiness.  - Naprosyn 500 mg prescribed today.  See med list. - Advised patient to take naprosyn every 12 hours for 3-4 days to break the pain cycle, along with the flexeril.  Told patient to stop taking after 3-4 days, to reduce risk of side-effects such as ulcer. - Discussed importance of avoiding chronic use of NSAID's.  - Discussed prudent use of Lidoderm patches OTC with patient today.  - Encouraged patient to continue walking, remaining active, and moving to help alleviate pain.  - Patient will call with any concerns and return for additional care PRN.  Type 2 DM - Told patient to come in for appointment near future as she is overdue.  She was meant to come in September for regular follow-up.  Patient knows to call and schedule OV near future.  - Explained to patient that steroids are not indicated at this time due to lack of radicular symptoms, and given her history of diabetes.   Recommendations - Return in VERY near future for chronic care of her diabetes, hyperlipidemia, hypertension etc.  Last A1c was 6.7 and that was over 6 months ago now.  Patient was told to follow-up in 3-43mo.   - As part of my medical decision making, I reviewed the following data  within the [place] History obtained from pt /family, CMA notes reviewed and incorporated if applicable, Labs reviewed, Radiograph/ tests reviewed if applicable and OV notes from prior OV's with me, as well as other specialists she/he has seen since seeing me last, were all reviewed and used in my medical decision making process today.   - Additionally, discussion had with patient regarding txmnt plan, their biases about that plan etc were used in my medical decision making today.   - The patient agreed with the plan and demonstrated an understanding of the instructions.  No barriers to understanding were identified.    -  Red flag symptoms and signs discussed in detail.  Patient expressed understanding regarding what to do in case of emergency\ urgent symptoms.  The patient was advised to call back or seek an in-person evaluation if the symptoms worsen or if the condition fails to improve as anticipated.   Return for chronic care near future, as patient was due in September 2020.    Meds ordered this encounter  Medications  . cyclobenzaprine (FLEXERIL) 10 MG tablet    Sig: Take 1 tablet (10 mg total) by mouth 3 (three) times daily as needed for muscle spasms.    Dispense:  30 tablet    Refill:  0  . naproxen (NAPROSYN) 500 MG tablet    Sig: Take 1 tablet (500 mg total) by mouth 2 (two) times daily with a meal. Prn severe pain    Dispense:  30 tablet    Refill:  0    Medications Discontinued by CMA During This Encounter due to pt stating no longer on them  Medication Reason  . ALPRAZolam (XANAX) 1 MG tablet Error  . estradiol (ESTRACE) 0.5 MG tablet Error  . progesterone (PROMETRIUM) 200 MG capsule Error      Note:  This note was prepared with assistance of Dragon voice recognition software. Occasional wrong-word or sound-a-like substitutions may have occurred due to the inherent limitations of voice recognition software.  This document serves as a record of  services personally performed by Mellody Dance, DO. It was created on her behalf by [name], a trained medical scribe. The creation of this record is based on the scribe's personal observations and the provider's statements to them.   This case required medical decision making of at least moderate complexity. The above documentation has been reviewed to be accurate and was completed by Marjory Sneddon, D.O.       Patient Care Team    Relationship Specialty Notifications Start End  Mellody Dance, DO PCP - General Family Medicine  11/26/16   Defrancesco, Alanda Slim, MD Consulting Physician Obstetrics and Gynecology  11/12/16   Joylene Igo, CNM Midwife Obstetrics and Gynecology  11/12/16   Danella Sensing, MD Consulting Physician Dermatology  11/12/16   Richmond Campbell, MD Consulting Physician Gastroenterology  11/26/16   Carol Ada, MD Consulting Physician Gastroenterology  11/26/16   Keene Breath., MD  Ophthalmology  11/26/16   Joylene Igo, CNM Midwife Obstetrics and Gynecology  11/26/16    Comment: Prescribes her Seroquel    -Vitals obtained; medications/ allergies reconciled;  personal medical, social, Sx etc.histories were updated by CMA, reviewed by me and are reflected in chart  Patient Active Problem List   Diagnosis Date Noted  . Mood disorder ([place])- mixed anxiety and depression 01/20/2018  . Obesity, Class I, BMI 30-34.9 06/10/2017  . Primary insomnia 11/12/2016  . Adjustment disorder with mixed anxiety and depressed mood 11/12/2016  . Elevated cholesterol with elevated triglycerides 09/28/2016  . Hypertension associated with diabetes ([place]) 01/31/2016  . Generalized anxiety disorder 01/31/2016  . Postmenopausal- 30 yrs ago TAH "yrs ago" 11/12/2016  . Hormone imbalance- txed by gyn 11/12/2016  . Mixed hyperlipidemia 11/12/2016  . Hypokalemia 02/01/2016  . Vitamin D deficiency 09/28/2016  . Status post abdominal hysterectomy 12/23/2014  . Mixed  diabetic hyperlipidemia associated with type 2 diabetes mellitus ([place]) 10/23/2018  . Diabetes mellitus ([place]) 10/23/2018  . Secondary hyperhidrosis- not focal in nature but primarily from waist up  is the worst 10/23/2018  . Acute stress reaction 08/20/2018  . Excess, secretion, sweat 08/20/2018  . Low serum progesterone 05/30/2018  . Panic attack 01/20/2018  . Excessive sweating- likely due to hormonal 11/11/2017  . Inactivity 11/11/2017  . Elevated serum creatinine 09/04/2017  . Elevated ALT measurement 09/04/2017  . Acute biliary pancreatitis 01/31/2016  . Overweight 12/23/2014  . Surgical menopause 12/23/2014  . History of cervical dysplasia 12/23/2014  . SUI (stress urinary incontinence, female) 12/23/2014     Current Meds  Medication Sig  . Ascorbic Acid (VITAMIN C) 1000 MG tablet Take 1,000 mg by mouth daily.  Marland Kitchen atorvastatin (LIPITOR) 10 MG tablet TAKE 1 TABLET (10 MG TOTAL) BY MOUTH NIGHTLY.  . Calcium Carbonate (CALTRATE 600 PO) Take 1 tablet by mouth daily.  . Multiple Vitamins-Minerals (CENTRUM SILVER PO) Take 1 tablet by mouth daily.  Marland Kitchen omega-3 acid ethyl esters (LOVAZA) 1 g capsule Take 2 capsules (2 g total) by mouth 2 (two) times daily.  . potassium chloride (K-DUR) 10 MEQ tablet TAKE 1 TABLET BY MOUTH DAILY.  . QC LO-DOSE ASPIRIN 81 MG EC tablet TAKE 1 TABLET (81 MG TOTAL) BY MOUTH DAILY.  Marland Kitchen triamterene-hydrochlorothiazide (MAXZIDE-25) 37.5-25 MG tablet TAKE 1 TABLET BY MOUTH DAILY.  . valACYclovir (VALTREX) 500 MG tablet TAKE 1 TABLET BY MOUTH DAILY.  Marland Kitchen Vitamin D, Ergocalciferol, (DRISDOL) 1.25 MG (50000 UT) CAPS capsule TAKE 1 CAPSULE (50,000 UNITS TOTAL) BY MOUTH EVERY 7 (SEVEN) DAYS.  Marland Kitchen zolpidem (AMBIEN CR) 12.5 MG CR tablet Take 1 tablet (12.5 mg total) by mouth at bedtime as needed. for sleep     Allergies  Allergen Reactions  . Prozac [Fluoxetine Hcl] Other (See Comments)    sweating     ROS:  See above HPI for pertinent positives and  negatives   Objective:   There were no vitals taken for this visit.  (if some vitals are omitted, this means that patient was UNABLE to obtain them even though they were asked to get them prior to OV today.  They were asked to call us at their earliest convenience with these once obtained.)  General: A & O * 3; visually in no acute distress; in usual state of health.  Skin: Visible skin appears normal and pt's usual skin color HEENT:  EOMI, head is normocephalic and atraumatic.  Sclera are anicteric. Neck has a good range of motion.  Lips are noncyanotic Chest: normal chest excursion and movement Respiratory: speaking in full sentences, no conversational dyspnea; no use of accessory muscles Psych: insight good, mood- appears full

## 2019-05-25 ENCOUNTER — Other Ambulatory Visit: Payer: Self-pay | Admitting: Family Medicine

## 2019-05-25 DIAGNOSIS — B009 Herpesviral infection, unspecified: Secondary | ICD-10-CM

## 2019-06-01 ENCOUNTER — Other Ambulatory Visit: Payer: Medicare Other

## 2019-06-01 ENCOUNTER — Ambulatory Visit: Payer: Medicare Other | Admitting: Family Medicine

## 2019-06-03 ENCOUNTER — Other Ambulatory Visit: Payer: Self-pay | Admitting: Family Medicine

## 2019-06-03 DIAGNOSIS — E559 Vitamin D deficiency, unspecified: Secondary | ICD-10-CM

## 2019-06-04 ENCOUNTER — Other Ambulatory Visit: Payer: Self-pay

## 2019-06-04 ENCOUNTER — Ambulatory Visit (INDEPENDENT_AMBULATORY_CARE_PROVIDER_SITE_OTHER): Payer: Medicare Other | Admitting: Family Medicine

## 2019-06-04 ENCOUNTER — Encounter: Payer: Self-pay | Admitting: Family Medicine

## 2019-06-04 VITALS — BP 139/84 | HR 101 | Temp 98.2°F | Resp 14 | Ht 67.0 in | Wt 195.8 lb

## 2019-06-04 DIAGNOSIS — F39 Unspecified mood [affective] disorder: Secondary | ICD-10-CM

## 2019-06-04 DIAGNOSIS — E1169 Type 2 diabetes mellitus with other specified complication: Secondary | ICD-10-CM

## 2019-06-04 DIAGNOSIS — E559 Vitamin D deficiency, unspecified: Secondary | ICD-10-CM

## 2019-06-04 DIAGNOSIS — I10 Essential (primary) hypertension: Secondary | ICD-10-CM

## 2019-06-04 DIAGNOSIS — E1159 Type 2 diabetes mellitus with other circulatory complications: Secondary | ICD-10-CM

## 2019-06-04 DIAGNOSIS — I152 Hypertension secondary to endocrine disorders: Secondary | ICD-10-CM

## 2019-06-04 DIAGNOSIS — E782 Mixed hyperlipidemia: Secondary | ICD-10-CM

## 2019-06-04 LAB — POCT GLYCOSYLATED HEMOGLOBIN (HGB A1C): Hemoglobin A1C: 7.5 % — AB (ref 4.0–5.6)

## 2019-06-04 LAB — POCT UA - MICROALBUMIN
Creatinine, POC: 300 mg/dL
Microalbumin Ur, POC: 80 mg/L

## 2019-06-04 MED ORDER — VITAMIN D (ERGOCALCIFEROL) 1.25 MG (50000 UNIT) PO CAPS: 50000.0000 [IU] | ORAL_CAPSULE | ORAL | 1 refills | Status: DC

## 2019-06-04 MED ORDER — LISINOPRIL 5 MG PO TABS: 5.0000 mg | ORAL_TABLET | Freq: Every day | ORAL | 0 refills | Status: DC

## 2019-06-04 MED ORDER — BLOOD GLUCOSE METER KIT: PACK | 0 refills | Status: DC

## 2019-06-04 MED ORDER — BUPROPION HCL ER (XL) 150 MG PO TB24: 150.0000 mg | ORAL_TABLET | Freq: Every day | ORAL | 1 refills | Status: DC

## 2019-06-04 MED ORDER — METFORMIN HCL 500 MG PO TABS: 500.0000 mg | ORAL_TABLET | Freq: Two times a day (BID) | ORAL | 0 refills | Status: DC

## 2019-06-04 NOTE — Patient Instructions (Signed)

## 2019-06-04 NOTE — Progress Notes (Signed)
Impression and Recommendations:    1. Type 2 diabetes mellitus with other specified complication, without long-term current use of insulin ([place])   2. Hypertension associated with diabetes ([place])   3. Mixed diabetic hyperlipidemia associated with type 2 diabetes mellitus ([place])   4. Vitamin D deficiency   5. Mood disorder ([facility])     Type 2 Diabetes Mellitus-   NEW ONSET of meds  - A1c is up to 7.5 from 6.7 prior, worsened, unstable.  - Discussed critical need to begin medication management today. - Discussed beginning metformin with patient today.  - Begin Metformin BID today.  See med list. - Patient knows to try to work on eating healthier / reducing carb intake on Metformin. - Advised patient to never take metformin on an empty stomach.  - Blood glucose monitoring supplies ordered today. - Advised patient that she may come in to the clinic any time to review how to use these supplies with staff.  - Counseled patient on pathophysiology of disease and discussed various treatment options, which always includes dietary and lifestyle modification as first line.    - Importance of low carb, heart-healthy diet discussed with patient in addition to regular aerobic exercise of 72mn 5d/week or more.   - Check FBS and 2 hours after the biggest meal of your day.  Keep log and bring in next OV for my review.     - Also told patient if you ever feel poorly, please check your blood pressure and blood sugar, as one or the other could be the cause of your symptoms.  - Pt reminded about need for yearly eye and foot exams.  Told patient to make appt.for diabetic eye exam, CMAs here will do foot exams  - Handouts provided at patient's desire and or told to go online at the American Diabetes Association website for further information  - We will continue to monitor and re-check as discussed.   Mixed Diabetic Hyperlipidemia associated with DM - Cholesterol levels last checked 7 months  ago: - Triglycerides = 231, elevated. - HDL = 36, low. - LDL = 55, WNL.  - Continue Lipitor nightly before bed.  See med list. - To protect liver health, advised patient to continue to avoid use of alcohol.  - Dietary changes such as low saturated & trans fat diets for hyperlipidemia and low carb diets for hypertriglyceridemia discussed with patient.    - Encouraged patient to follow AHA guidelines for regular exercise and also engage in weight loss if BMI above 25.   - We will continue to monitor and-recheck as discussed.   Hypertension associated with DM - Per patient, blood pressure at home is at goal of under 135/85.  - Discussed concurrent diagnosis of diabetes with hypertension, and importance of beginning medication to help protect the kidneys.    - Begin 5 mg of lisinopril today.  See med list.  - Patient knows to monitor for signs of symptomatic low blood pressure and call in to clinic if she experiences concerns.  - Counseled patient on pathophysiology of disease and discussed various treatment options, which always includes dietary and lifestyle modification as first line.   - Lifestyle changes such as dash and heart healthy diets and engaging in a regular exercise program discussed extensively with patient.   - Ambulatory blood pressure monitoring encouraged at least 3 times weekly.  Keep log and bring in every office visit.  Reminded patient that if they ever feel poorly  in any way, to check their blood pressure and pulse.  - We will continue to monitor.   Mood Disorder - Per patient, mood worsening over 2020. - Reviewed patient's symptoms extensively during appointment. - Discussed options for prescription intervention.  - Per patient, has never been managed on Wellbutrin in the past. - Risks, benefits, and alternatives to mood medications discussed with patient. -  Will begin mood medication with patient today- due to her strong desire to NOT gain wt, we will go  with wellbutrin  - In addition to prescription intervention and the option of therapy/counseling, reviewed the "spokes of the wheel" of mood and health management.  Stressed the importance of ongoing prudent habits, including regular exercise, appropriate sleep hygiene, healthful dietary habits, and prayer/meditation to relax.  - If patient's symptoms do not improve on management as prescribed, discussed next step would be referral to psychiatry specialist.  - Will continue to monitor.  Vitamin D Deficiency - 54.9 last check seven months ago, at goal. - Continue supplementation as prescribed.  - Will continue to monitor and re-check as discussed.  BMI Counseling - Body mass index is 30.67 kg/m Explained to patient what BMI refers to, and what it means medically.    Told patient to think about it as a "medical risk stratification measurement" and how increasing BMI is associated with increasing risk/ or worsening state of various diseases such as hypertension, hyperlipidemia, diabetes, premature OA, depression etc.  American Heart Association guidelines for healthy diet, basically Mediterranean diet, and exercise guidelines of 30 minutes 5 days per week or more discussed in detail.  Health counseling performed.  All questions answered.  Lifestyle & Preventative Health Maintenance - Advised patient to continue working toward exercising and prudent weight loss to improve overall mental, physical, and emotional health.    - Encouraged patient to engage in daily physical activity as tolerated, especially a formal exercise routine.  Recommended that the patient eventually strive for at least 150 minutes of moderate cardiovascular activity per week according to guidelines established by the [facility].   - Healthy dietary habits encouraged, including low-carb, and high amounts of lean protein in diet.   - Patient should also consume adequate amounts of water.      Orders Placed This Encounter   Procedures  . Comprehensive metabolic panel  . Lipid panel  . Direct LDL  . POCT glycosylated hemoglobin (Hb A1C)  . POCT UA - Microalbumin    Meds ordered this encounter  Medications  . Vitamin D, Ergocalciferol, (DRISDOL) 1.25 MG (50000 UNIT) CAPS capsule    Sig: Take 1 capsule (50,000 Units total) by mouth every 7 (seven) days.    Dispense:  12 capsule    Refill:  1  . lisinopril (ZESTRIL) 5 MG tablet    Sig: Take 1 tablet (5 mg total) by mouth daily.    Dispense:  90 tablet    Refill:  0  . metFORMIN (GLUCOPHAGE) 500 MG tablet    Sig: Take 1 tablet (500 mg total) by mouth 2 (two) times daily with a meal.    Dispense:  180 tablet    Refill:  0  . buPROPion (WELLBUTRIN XL) 150 MG 24 hr tablet    Sig: Take 1 tablet (150 mg total) by mouth daily.    Dispense:  90 tablet    Refill:  1  . DISCONTD: blood glucose meter kit and supplies    Sig: Dispense based on patient and insurance preference. Use up to  four times daily as directed. (FOR ICD-10 E10.9, E11.9).    Dispense:  1 each    Refill:  0    Order Specific Question:   Number of strips    Answer:   100    Order Specific Question:   Number of lancets    Answer:   100  . DISCONTD: blood glucose meter kit and supplies    Sig: Dispense based on patient and insurance preference. Use up to four times daily as directed. (FOR ICD-10 E10.9, E11.9).    Dispense:  1 each    Refill:  0    Order Specific Question:   Number of strips    Answer:   100    Order Specific Question:   Number of lancets    Answer:   100  . blood glucose meter kit and supplies    Sig: Dispense based on patient and insurance preference. Use up to four times daily as directed. (FOR ICD-10 E10.9, E11.9).    Dispense:  1 each    Refill:  0    Order Specific Question:   Number of strips    Answer:   100    Order Specific Question:   Number of lancets    Answer:   100    Medications Discontinued During This Encounter  Medication Reason  . Vitamin D,  Ergocalciferol, (DRISDOL) 1.25 MG (50000 UNIT) CAPS capsule   . blood glucose meter kit and supplies Reorder  . blood glucose meter kit and supplies Reorder     Gross side effects, risk and benefits, and alternatives of medications and treatment plan in general discussed with patient.  Patient is aware that all medications have potential side effects and we are unable to predict every side effect or drug-drug interaction that may occur.   Patient will call with any questions prior to using medication if they have concerns.    Expresses verbal understanding and consents to current therapy and treatment regimen.  No barriers to understanding were identified.  Red flag symptoms and signs discussed in detail.  Patient expressed understanding regarding what to do in case of emergency\urgent symptoms  Please see AVS handed out to patient at the end of our visit for further patient instructions/ counseling done pertaining to today's office visit.   Return for f/up in 6 wks- started metformin, lisinopril, wellbutrin.     Note:  This note was prepared with assistance of Dragon voice recognition software. Occasional wrong-word or sound-a-like substitutions may have occurred due to the inherent limitations of voice recognition software.  This document serves as a record of services personally performed by Mellody Dance, DO. It was created on her behalf by [name], a trained medical scribe. The creation of this record is based on the scribe's personal observations and the provider's statements to them.   This case required medical decision making of at least moderate complexity. The above documentation from [name], medical scribe, has been reviewed by Marjory Sneddon,  D.O.       --------------------------------------------------------------------------------------------------------------------------------------------------------------------------------------------------------------------------------------------    Subjective:     HPI: Mercedes Webb is a 64 y.o. female who presents to [place] [facility] today for issues as discussed below.   Pt reports she lost her OBGYN and needs to establish with a new one.   - Mood Concerns Hasn't seen anyone for mood management, no counseling as recommended in past.  Notes 2020 has been a lot to handle.  She saw a "hormonal specialist" ( I believe a CAM provider)  and notes "I stopped taking all of that stuff."  Notes she still cries easily, and "I have less tolerance (cares less)."  Says the "sweating is not as bad, but still there."  Says she thinks Prozac made her feel bad, but it's been a long time.   She was managed on citalopram and buspar in the past.   Regarding the citalopram, "I didn't think I was getting the benefit from it that I should; it was like I was taking something for nothing."  Notes experiencing both depression and anxiety.  Says sometimes she feels focus is difficult for her.  Says she used to "just be able to read," but now sometimes she "couldn't read if her life depended on it."  "I think last year was so all over the place that I just didn't give a shit; like 'I'll do it when I do it.'"  Denies history of big mood swings feeling anxious one moment and depressed the next.   - Visual Changes, Worsening Has had vision changes in the last few months; decreased vision.   -  She does wear glasses for assistance, but has not had eval by her optho provider in a very long time.  Notes "some days my vision is blurry," and notes her eyes are very dry.  Says "when my eyes are like that, I don't even try to read anything."  Has been using a lubricant for her eyes  for assistance she bought OTC and helping.  She will f/up with    - Eating Habits Notes she's started trying to eat more vegetables with leaner meat, and "I don't eat a lot of meat."  She fixes a lot of stir fry and notes she "doesn't use a lot of oil."   - Chest Indicates concern that her xyphoid process of chest feels protruding and she wonders if it is normal   Says it's been bothering her for about a month.  Says "I've never noticed it protruding like that, and it feels like a nodule.  I don't know if that's normal."    HPI:   Diabetes Mellitus:  Home glucose readings:  She has not been checking her blood sugar at home.   - Patient reports good compliance with therapy plan: medication and/or lifestyle modification.  - Her denies acute concerns or problems related to treatment plan  - She denies new concerns.  Denies polyuria/polydipsia, hypo/ hyperglycemia symptoms.  Denies new onset of: chest pain, exercise intolerance, shortness of breath, dizziness, visual changes, headache, lower extremity swelling or claudication.   Last A1C in the office was:  Lab Results  Component Value Date   HGBA1C 7.5 (A) 06/04/2019   HGBA1C 6.7 (H) 10/20/2018   HGBA1C 5.6 08/29/2017   Lab Results  Component Value Date   MICROALBUR 80 06/04/2019   LDLCALC 68 06/04/2019   CREATININE 0.96 06/04/2019   BP Readings from Last 3 Encounters:  06/04/19 139/84  10/23/18 134/85  09/12/18 (!) 144/80   Wt Readings from Last 3 Encounters:  06/04/19 195 lb 12.8 oz (88.8 kg)  10/23/18 199 lb 9.6 oz (90.5 kg)  09/12/18 201 lb 9.6 oz (91.4 kg)     HPI:  Hypertension:  -  Her blood pressure at home has been running: 120/78; notes "nothing over 130/80."  - Patient reports good compliance with medication and/or lifestyle modification  - Her denies acute concerns or problems related to treatment  plan  - She denies new onset of: chest pain, exercise intolerance, shortness of breath, dizziness, visual  changes, headache, lower extremity swelling or claudication.   Last 3 blood pressure readings in our office are as follows: BP Readings from Last 3 Encounters:  06/04/19 139/84  10/23/18 134/85  09/12/18 (!) 144/80   Filed Weights   06/04/19 1005  Weight: 195 lb 12.8 oz (88.8 kg)     HPI:  Hyperlipidemia:  64 y.o. female here for cholesterol follow-up.   - Patient reports good compliance with treatment plan of:  medication and/ or lifestyle management.    - Patient denies any acute concerns or problems with management plan   - She denies new onset of: myalgias, arthralgias, increased fatigue more than normal, chest pains, exercise intolerance, shortness of breath, dizziness, visual changes, headache, lower extremity swelling or claudication.   Most recent cholesterol panel was:  Lab Results  Component Value Date   CHOL 149 06/04/2019   HDL 45 06/04/2019   LDLCALC 68 06/04/2019   LDLDIRECT 76 06/04/2019   TRIG 223 (H) 06/04/2019   CHOLHDL 3.3 06/04/2019   Hepatic Function Latest Ref Rng & Units 06/04/2019 10/20/2018 04/24/2018  Total Protein 6.0 - 8.5 g/dL 7.9 7.3 7.3  Albumin 3.8 - 4.8 g/dL 4.9(H) 4.2 4.5  AST 0 - 40 IU/L 43(H) 58(H) 54(H)  ALT 0 - 32 IU/L 59(H) 70(H) 67(H)  Alk Phosphatase 39 - 117 IU/L 130(H) 126(H) 106  Total Bilirubin 0.0 - 1.2 mg/dL 0.5 0.4 0.3  Bilirubin, Direct 0.1 - 0.5 mg/dL - - -        Wt Readings from Last 3 Encounters:  06/04/19 195 lb 12.8 oz (88.8 kg)  10/23/18 199 lb 9.6 oz (90.5 kg)  09/12/18 201 lb 9.6 oz (91.4 kg)   BP Readings from Last 3 Encounters:  06/04/19 139/84  10/23/18 134/85  09/12/18 (!) 144/80   Pulse Readings from Last 3 Encounters:  06/04/19 (!) 101  10/23/18 95  09/12/18 (!) 104   BMI Readings from Last 3 Encounters:  06/04/19 30.67 kg/m  10/23/18 31.26 kg/m  09/12/18 31.58 kg/m     Patient Care Team    Relationship Specialty Notifications Start End  Mellody Dance, DO PCP - General Family  Medicine  11/26/16   Defrancesco, Alanda Slim, MD Consulting Physician Obstetrics and Gynecology  11/12/16   Joylene Igo, CNM Midwife Obstetrics and Gynecology  11/12/16   Danella Sensing, MD Consulting Physician Dermatology  11/12/16   Richmond Campbell, MD Consulting Physician Gastroenterology  11/26/16   Carol Ada, MD Consulting Physician Gastroenterology  11/26/16   Keene Breath., MD  Ophthalmology  11/26/16   Joylene Igo, CNM Midwife Obstetrics and Gynecology  11/26/16    Comment: Prescribes her Seroquel     Patient Active Problem List   Diagnosis Date Noted  . Mood disorder ([place])- mixed anxiety and depression 01/20/2018  . Obesity, Class I, BMI 30-34.9 06/10/2017  . Primary insomnia 11/12/2016  . Adjustment disorder with mixed anxiety and depressed mood 11/12/2016  . Elevated cholesterol with elevated triglycerides 09/28/2016  . Hypertension associated with diabetes ([place]) 01/31/2016  . Generalized anxiety disorder 01/31/2016  . Postmenopausal- 30 yrs ago TAH "yrs ago" 11/12/2016  . Hormone imbalance- txed by gyn 11/12/2016  . Mixed hyperlipidemia 11/12/2016  . Hypokalemia 02/01/2016  . Vitamin D deficiency 09/28/2016  . Status post abdominal hysterectomy 12/23/2014  . Mixed diabetic hyperlipidemia associated with type 2 diabetes mellitus ([place]) 10/23/2018  .  Diabetes mellitus ([place]) 10/23/2018  . Secondary hyperhidrosis- not focal in nature but primarily from waist up is the worst 10/23/2018  . Acute stress reaction 08/20/2018  . Excess, secretion, sweat 08/20/2018  . Low serum progesterone 05/30/2018  . Panic attack 01/20/2018  . Excessive sweating- likely due to hormonal 11/11/2017  . Inactivity 11/11/2017  . Elevated serum creatinine 09/04/2017  . Elevated ALT measurement 09/04/2017  . Acute biliary pancreatitis 01/31/2016  . Overweight 12/23/2014  . Surgical menopause 12/23/2014  . History of cervical dysplasia 12/23/2014  . SUI (stress urinary incontinence,  female) 12/23/2014    Past Medical history, Surgical history, Family history, Social history, Allergies and Medications have been entered into the medical record, reviewed and changed as needed.    Current Meds  Medication Sig  . Ascorbic Acid (VITAMIN C) 1000 MG tablet Take 1,000 mg by mouth daily.  Marland Kitchen atorvastatin (LIPITOR) 10 MG tablet TAKE 1 TABLET (10 MG TOTAL) BY MOUTH NIGHTLY.  . Calcium Carbonate (CALTRATE 600 PO) Take 1 tablet by mouth daily.  . Multiple Vitamins-Minerals (CENTRUM SILVER PO) Take 1 tablet by mouth daily.  Marland Kitchen omega-3 acid ethyl esters (LOVAZA) 1 g capsule Take 2 capsules (2 g total) by mouth 2 (two) times daily.  . potassium chloride (KLOR-CON) 10 MEQ tablet TAKE 1 TABLET BY MOUTH DAILY.  . QC LO-DOSE ASPIRIN 81 MG EC tablet TAKE 1 TABLET (81 MG TOTAL) BY MOUTH DAILY.  Marland Kitchen triamterene-hydrochlorothiazide (MAXZIDE-25) 37.5-25 MG tablet TAKE 1 TABLET BY MOUTH DAILY.  . valACYclovir (VALTREX) 500 MG tablet TAKE 1 TABLET BY MOUTH DAILY. *NEED OFFICE VISIT FOR FURTHER REFILLS*  . Vitamin D, Ergocalciferol, (DRISDOL) 1.25 MG (50000 UNIT) CAPS capsule Take 1 capsule (50,000 Units total) by mouth every 7 (seven) days.  Marland Kitchen zolpidem (AMBIEN CR) 12.5 MG CR tablet Take 1 tablet (12.5 mg total) by mouth at bedtime as needed. for sleep  . [DISCONTINUED] Vitamin D, Ergocalciferol, (DRISDOL) 1.25 MG (50000 UNIT) CAPS capsule TAKE 1 CAPSULE BY MOUTH EVERY 7 DAYS.    Allergies:  Allergies  Allergen Reactions  . Prozac [Fluoxetine Hcl] Other (See Comments)    sweating     Review of Systems:  A fourteen system review of systems was performed and found to be positive as per HPI.   Objective:   Blood pressure 139/84, pulse (!) 101, temperature 98.2 F (36.8 C), temperature source Oral, resp. rate 14, height 5' 7"  (1.702 m), weight 195 lb 12.8 oz (88.8 kg), SpO2 96 %. Body mass index is 30.67 kg/m. General:  Well Developed, well nourished, appropriate for stated age.  Neuro:   Alert and oriented,  extra-ocular muscles intact  HEENT:  Normocephalic, atraumatic, neck supple, no carotid bruits appreciated  Skin:  no gross rash, warm, pink. Cardiac:  RRR, S1 S2 Respiratory:  ECTA B/L and A/P, Not using accessory muscles, speaking in full sentences- unlabored. Vascular:  Ext warm, no cyanosis apprec.; cap RF less 2 sec. Psych:  No HI/SI, judgement and insight good, Euthymic mood. Full Affect.

## 2019-06-05 ENCOUNTER — Telehealth: Payer: Self-pay | Admitting: Family Medicine

## 2019-06-05 DIAGNOSIS — E1169 Type 2 diabetes mellitus with other specified complication: Secondary | ICD-10-CM

## 2019-06-05 LAB — LIPID PANEL
Chol/HDL Ratio: 3.3 ratio (ref 0.0–4.4)
Cholesterol, Total: 149 mg/dL (ref 100–199)
HDL: 45 mg/dL (ref >39)
LDL Chol Calc (NIH): 68 mg/dL (ref 0–99)
Triglycerides: 223 mg/dL — ABNORMAL HIGH (ref 0–149)
VLDL Cholesterol Cal: 36 mg/dL (ref 5–40)

## 2019-06-05 LAB — COMPREHENSIVE METABOLIC PANEL
ALT: 59 IU/L — ABNORMAL HIGH (ref 0–32)
AST: 43 IU/L — ABNORMAL HIGH (ref 0–40)
Albumin/Globulin Ratio: 1.6 (ref 1.2–2.2)
Albumin: 4.9 g/dL — ABNORMAL HIGH (ref 3.8–4.8)
Alkaline Phosphatase: 130 IU/L — ABNORMAL HIGH (ref 39–117)
BUN/Creatinine Ratio: 15 (ref 12–28)
BUN: 14 mg/dL (ref 8–27)
Bilirubin Total: 0.5 mg/dL (ref 0.0–1.2)
CO2: 24 mmol/L (ref 20–29)
Calcium: 10.2 mg/dL (ref 8.7–10.3)
Chloride: 97 mmol/L (ref 96–106)
Creatinine, Ser: 0.96 mg/dL (ref 0.57–1.00)
GFR calc Af Amer: 72 mL/min/{1.73_m2} (ref >59)
GFR calc non Af Amer: 62 mL/min/{1.73_m2} (ref >59)
Globulin, Total: 3 g/dL (ref 1.5–4.5)
Glucose: 291 mg/dL — ABNORMAL HIGH (ref 65–99)
Potassium: 4.9 mmol/L (ref 3.5–5.2)
Sodium: 137 mmol/L (ref 134–144)
Total Protein: 7.9 g/dL (ref 6.0–8.5)

## 2019-06-05 LAB — LDL CHOLESTEROL, DIRECT: LDL Direct: 76 mg/dL (ref 0–99)

## 2019-06-05 NOTE — Telephone Encounter (Signed)
Patient called states she was unable to get Glucose monitor @ [facility] had it transferred to [facility] but when went to pick up was told they were Unable to fill due to the number of test strips written on Rx-- Medicare part BD only allow 1 test strip per day?? Per them new script needs to be sent in order to fill Rx :  blood glucose meter kit and supplies [[id number]]   Order Details Dose, Route, Frequency: As Directed  Dispense Quantity: 1 each Refills: 0       Sig: Dispense based on patient and insurance preference. Use up to four times daily as directed. (FOR ICD-10 E10.9, E11.9).   --advised pt medical staff leaves @ 12 noon on Fridays & would forwarding but would be address on next business day (Monday).  FYI to medical staff please resubmit amended Accu-check kit & supplies (INCLUDING Test Strips) order to :    [facility] ([address] [place] [address] [phone] (Phone) [phone] (Fax)   --glh

## 2019-06-05 NOTE — Telephone Encounter (Signed)
Patient is having issues with the glucose meter order that PCP placed yesterday, she would like to discuss how she can fix this with the clinic staff. Please call when available.

## 2019-06-08 MED ORDER — BLOOD GLUCOSE METER KIT: PACK | 0 refills | Status: AC

## 2019-06-08 MED ORDER — BLOOD GLUCOSE MONITOR KIT: PACK | 0 refills | Status: DC

## 2019-06-08 NOTE — Addendum Note (Signed)
Addended by: Mickel Crow on: 06/08/2019 09:57 AM   Modules accepted: Orders

## 2019-06-08 NOTE — Telephone Encounter (Signed)
Issue has been fixed. RX printed and faxed to [facility] on [address]. Patient is aware of this and verbalized understanding. AS, CMA

## 2019-06-08 NOTE — Telephone Encounter (Signed)
See other note. AS, CMA 

## 2019-06-11 ENCOUNTER — Telehealth: Payer: Self-pay | Admitting: Family Medicine

## 2019-06-11 NOTE — Telephone Encounter (Signed)
Out of all of those would recommend she stop the Wellbutrin.  This 1 is most likely to be causing those symptoms.  Please have her discontinue the Wellbutrin and we will send her to psychiatry for further management of her mood disorder if she wishes.  Please place a referral to psychiatry if this is what she prefers for management of her mood.   Also, what have her blood sugars and blood pressures been running?   Told her to check FBS and 2 hr PP.   If her blood sugars are running all consistently less than 130 fasting and less than 182 hours postprandial and her blood pressures are all running under 130/80 which is her goal, I would recommend she take the lisinopril and Metformin as prescribed but go to 1/2 tablet instead of 1 tablet.   However, if her blood pressures and blood sugars are not well controlled, we would need to change medications.  If this is the case, ask her if she would like me to just rec changes to meds or if she would like to discuss - then an OV would be needed.   Thnx

## 2019-06-11 NOTE — Telephone Encounter (Signed)
Patient states she thinks this reaction is from the Metformin and not the Wellbutrin but is questioning whether or not she should start taking Metformin again. Patient has not been able to pick up glucometer and supplies because of insurance issues at pharmacy but states her bp have been okay.  I spoke with Dr. Raliegh Scarlet who states at this point it is best for patient to d/c all new meds and schedule telehealth apt to discuss reactions, medications and possible referrals. Patient is aware of this and apt scheduled for 06/17/19. AS, CMA

## 2019-06-11 NOTE — Telephone Encounter (Signed)
Patient called stating she was started on 3 new medications recently (Metformin, Buspar and Lisinopril) and that she is having an allergic reaction. She thinks the reaction is from the metformin. Her symptoms are as follows:   Heartburn/ Heavy feeling in chest (some SOB when chest felt heavy) Sneezing / Nasal congestion Itching / rash on face Sweating  Patient states symptoms are better now and that she has taken Benadryl and d/c metformin since 06/10/19.  Would like to know what to do?   AS, CMA

## 2019-06-17 ENCOUNTER — Ambulatory Visit (INDEPENDENT_AMBULATORY_CARE_PROVIDER_SITE_OTHER): Payer: Medicare Other | Admitting: Family Medicine

## 2019-06-17 ENCOUNTER — Other Ambulatory Visit: Payer: Self-pay

## 2019-06-17 ENCOUNTER — Encounter: Payer: Self-pay | Admitting: Family Medicine

## 2019-06-17 VITALS — BP 136/78 | Ht 67.0 in | Wt 195.0 lb

## 2019-06-17 DIAGNOSIS — F5101 Primary insomnia: Secondary | ICD-10-CM

## 2019-06-17 DIAGNOSIS — I10 Essential (primary) hypertension: Secondary | ICD-10-CM

## 2019-06-17 DIAGNOSIS — E1159 Type 2 diabetes mellitus with other circulatory complications: Secondary | ICD-10-CM

## 2019-06-17 DIAGNOSIS — E782 Mixed hyperlipidemia: Secondary | ICD-10-CM

## 2019-06-17 DIAGNOSIS — R809 Proteinuria, unspecified: Secondary | ICD-10-CM | POA: Insufficient documentation

## 2019-06-17 DIAGNOSIS — I152 Hypertension secondary to endocrine disorders: Secondary | ICD-10-CM

## 2019-06-17 DIAGNOSIS — E1129 Type 2 diabetes mellitus with other diabetic kidney complication: Secondary | ICD-10-CM | POA: Diagnosis not present

## 2019-06-17 DIAGNOSIS — F39 Unspecified mood [affective] disorder: Secondary | ICD-10-CM | POA: Diagnosis not present

## 2019-06-17 DIAGNOSIS — E1169 Type 2 diabetes mellitus with other specified complication: Secondary | ICD-10-CM | POA: Diagnosis not present

## 2019-06-17 LAB — BASIC METABOLIC PANEL: Glucose: 221

## 2019-06-17 MED ORDER — ZOLPIDEM TARTRATE ER 12.5 MG PO TBCR: 12.5000 mg | EXTENDED_RELEASE_TABLET | Freq: Every evening | ORAL | 1 refills | Status: DC | PRN

## 2019-06-17 MED ORDER — METFORMIN HCL 500 MG PO TABS: ORAL_TABLET | ORAL | 0 refills | Status: DC

## 2019-06-17 MED ORDER — LOSARTAN POTASSIUM 25 MG PO TABS: ORAL_TABLET | ORAL | 0 refills | Status: DC

## 2019-06-17 NOTE — Progress Notes (Signed)
Telehealth office visit note for Mercedes Webb, D.O- at Primary Care at [facility]   I connected with current patient today and verified that I am speaking with the correct person using two identifiers.   . Location of the patient: Home . Location of the provider: Office Only the patient (+/- their family members at pt's discretion) and myself were participating in the encounter - This visit type was conducted due to national recommendations for restrictions regarding the COVID-19 Pandemic (e.g. social distancing) in an effort to limit this patient's exposure and mitigate transmission in our community.  This format is felt to be most appropriate for this patient at this time.   - No physical exam could be performed with this format, beyond that communicated to [place] by the patient/ family members as noted.   - Additionally my office staff/ schedulers discussed with the patient that there may be a monetary charge related to this service, depending on their medical insurance.   The patient expressed understanding, and agreed to proceed.       History of Present Illness: No chief complaint on file.    I, Mercedes Webb, am serving as Education administrator for [employer].  Notes she's doing well today.  Per patient, discontinued all medications started last OV (Metformin, Wellbutrin, Lisinopril).  Took her last metformin last Wednesday (a week ago), and her last Wellbutrin last Thursday (six days ago).  Regarding the side effects on medication, she gets indigestion and heartburn sometimes regardless, but while on her new prescriptions notes "it was worse during the day in the morning, and worse during the evening."  She was convinced it was the metformin due to this, because she took metformin twice daily "and it seemed like the heartburn got worse at those times."  These symptoms seemed to subside once she quit taking metformin.  Had congestion and drainage in the back of her throat, and began  to have itching on her face and a rash on one side of her face.  Says she saved her papers to check side-effects, and noted she need to stop this and call in to the clinic for assistance.  HPI:   Diabetes Mellitus:  Home glucose readings: Blood sugar this morning was in the low 120's.  Today was the first time she checked her blood sugar because prior to now, she was having issues obtaining a glucometer / supplies through insurance.   - Patient reports good compliance with therapy plan: medication and/or lifestyle modification  - Her denies acute concerns or problems related to treatment plan  - She denies new concerns.  Denies polyuria/polydipsia, hypo/ hyperglycemia symptoms.  Denies new onset of: chest pain, exercise intolerance, shortness of breath, dizziness, visual changes, headache, lower extremity swelling or claudication.   Last A1C in the office was:  Lab Results  Component Value Date   HGBA1C 7.5 (A) 06/04/2019   HGBA1C 6.7 (H) 10/20/2018   HGBA1C 5.6 08/29/2017   Lab Results  Component Value Date   MICROALBUR 80 06/04/2019   LDLCALC 68 06/04/2019   CREATININE 0.96 06/04/2019   BP Readings from Last 3 Encounters:  06/17/19 136/78  06/04/19 139/84  10/23/18 134/85   Wt Readings from Last 3 Encounters:  06/17/19 195 lb (88.5 kg)  06/04/19 195 lb 12.8 oz (88.8 kg)  10/23/18 199 lb 9.6 oz (90.5 kg)   HPI:  Hypertension:  -  Her blood pressure at home has been running: "always been good if I sit  there for a little bit."  138/40; 140/78.  Says "it's always running within ten of either number.  - Patient reports good compliance with medication and/or lifestyle modification  - Her denies acute concerns or problems related to treatment plan  - She denies new onset of: chest pain, exercise intolerance, shortness of breath, dizziness, visual changes, headache, lower extremity swelling or claudication.   Last 3 blood pressure readings in our office are as follows: BP  Readings from Last 3 Encounters:  06/17/19 136/78  06/04/19 139/84  10/23/18 134/85   Filed Weights   06/17/19 1308  Weight: 195 lb (88.5 kg)      GAD 7 : Generalized Anxiety Score 04/28/2019 10/23/2018 08/20/2018 04/24/2018  Nervous, Anxious, on Edge _0 Control/stop worrying 0 2 0 0  Worry too much - different things 1 2 0 1  Trouble relaxing _1 Restless 1 0 0 0  Easily annoyed or irritable 1 0 2 0  Afraid - awful might happen 0 0 0 0  Total GAD 7 Score _2 Anxiety Difficulty Not difficult at all Very difficult - -    Depression screen [facility] 2/9 06/04/2019 04/28/2019 10/23/2018 08/20/2018 04/24/2018  Decreased Interest 0 0 0 1 1  Down, Depressed, Hopeless 1 1 0 1 1  PHQ - 2 Score 1 1 0 2 2  Altered sleeping _3 0 0  Tired, decreased energy _4 Change in appetite _5 Feeling bad or failure about yourself  0 0 2 1 0  Trouble concentrating 0 0 1 0 0  Moving slowly or fidgety/restless 0 0 0 0 0  Suicidal thoughts 0 0 0 0 0  PHQ-9 Score _6 Difficult doing work/chores Not difficult at all Not difficult at all Somewhat difficult - Not difficult at all  Some recent data might be hidden      Impression and Recommendations:    1. Mood disorder ([place])- mixed anxiety and depression   2. Type 2 diabetes mellitus with other specified complication, without long-term current use of insulin ([place])   3. Hypertension associated with diabetes ([place])   4. Mixed diabetic hyperlipidemia associated with type 2 diabetes mellitus ([place])   5. Microalbuminuria due to type 2 diabetes mellitus ([place])   6. Primary insomnia      - Last OV, patient began Metformin, Wellbutrin, Lisinopril, and although we discussed that, she did not stagger starting the medicines. -  Per patient, reports having an allergic reaction; she thinks the reaction was primarily from Metformin, but possibly any of the new meds.  Patient felt several symptoms including heartburn, chest  tightness, sneezing, nasal congestion, itching, inc sweating.   Mood Disorder - Mixed Anxiety & Depression, Primary Insomnia - Reviewed patient's symptoms during appointment today.  - Discussed that sweating can potentially be caused by mood medication such as Wellbutrin. - Discontinue Wellbutrin today. - Patient desires to put prescription mood management on hold while she works on her blood pressure and blood sugar.    - Advised patient to follow up with psychiatrist for specialized management and mood medication testing. - Ambulatory referral to psychiatry placed today.  See orders.  - Per patient, desires to establish with therapy/counseling. - Ambulatory referral to psychologist placed today.  See orders.  - Patient knows to call Dorothea Ogle of front desk if a week goes by without hearing about  her referrals.  - Reviewed the "spokes of the wheel" of mood and health management.  Stressed the importance of ongoing prudent habits, including regular exercise, appropriate sleep hygiene, healthful dietary habits, and prayer/meditation to relax.  - Will continue to monitor alongside specialist.   Type 2 Diabetes Mellitus - A1c 7.5 last check thirteen days ago, up from 6.7 eight months ago.  - Discussed that metformin can cause nausea and GI upset, but less likely to cause GERD. - Discussed importance of need for prescription management of diabetes at this time.  Patient understands risks and benefits and agrees to re-start.  - Re-start metformin at a half-tablet in the morning, and half-tablet in the evening. - increase to full tablet if tolerating well after a week or two.  - Do not take metformin on an empty stomach; always eat before taking this medication!   - Discussed that often the main side-effect is nausea/ GI upset as a side effect from this medicine  - Advised patient to begin each new medicine separately.  - If she is tolerating one full tablet of metformin daily, she may  begin losartan.    - She understands not to start any two meds at same time.   - Counseled patient on pathophysiology of disease and discussed various treatment options, which always includes dietary and lifestyle modification as first line.    - Importance of low carb, heart-healthy diet discussed with patient in addition to regular aerobic exercise of 61mn 5d/week or more.   - Check FBS and 2 hours after the biggest meal of your day.  Keep log and bring in next OV for my review.     - Also told patient if you ever feel poorly, please check your blood pressure and blood sugar, as one or the other could be the cause of your symptoms.   - Pt reminded about need for yearly eye and foot exams.  Told patient to make appt.for diabetic eye exam, CMAs here will do foot exams    - We will continue to monitor and re-check A1c as recommended.   Hypertension associated with DM - Blood pressure currently is suboptimally controlled. - Discussed goal BP of 135/85 or less.  - Explained critical need to control blood pressure given patient's diagnosis of diabetes.  - Extensively discussed possible reactions with patient during appointment today. - Reviewed that nasal congestion / sneezing could be caused by lisinopril.  - Pt has already D/Ced lisinopril.   Begin low-dose losartan today.  See med list.  - Told patient to begin with one-half tablet per day. - Wean up slowly on the dose for 1-2 weeks, looking for goal BP. - If tolerating well after a week or two, increase dose to one full tablet.  - Continue triamterene-HCTZ as established.  - Counseled patient on pathophysiology of disease and discussed various treatment options, which always includes dietary and lifestyle modification as first line.   - Lifestyle changes such as dash and heart healthy diets and engaging in a regular exercise program discussed extensively with patient.   - Ambulatory blood pressure monitoring encouraged at  least 3 times weekly.  Keep log and bring in every office visit.  Reminded patient that if they ever feel poorly in any way, to check their blood pressure and pulse.  - We will continue to monitor.   Mixed Diabetic Hyperlipidemia - Dietary changes such as low saturated & trans fat diets for hyperlipidemia and low carb diets for hypertriglyceridemia discussed  with patient.    - Encouraged patient to follow AHA guidelines for regular exercise and also engage in weight loss if BMI above 25.   - We will continue to monitor and re-check as discussed.   Health Counseling & Preventative Maintenance - Advised patient to continue working toward exercising to improve overall mental, physical, and emotional health.    - Encouraged patient to engage in daily physical activity as tolerated, especially a formal exercise routine.  Recommended that the patient eventually strive for at least 150 minutes of moderate cardiovascular activity per week according to guidelines established by the [facility].   - Healthy dietary habits encouraged, including low-carb, and high amounts of lean protein in diet.   - Patient should also consume adequate amounts of water.  - Health counseling performed.  All questions answered.  - As part of my medical decision making, I reviewed the following data within the [place] History obtained from pt /family, CMA notes reviewed and incorporated if applicable, Labs reviewed, Radiograph/ tests reviewed if applicable and OV notes from prior OV's with me, as well as other specialists she/he has seen since seeing me last, were all reviewed and used in my medical decision making process today.    - Additionally, discussion had with patient regarding our treatment plan, and their biases/concerns about that plan were used in my medical decision making today.    - The patient agreed with the plan and demonstrated an understanding of the instructions.   No barriers to  understanding were identified.    - Red flag symptoms and signs discussed in detail.  Patient expressed understanding regarding what to do in case of emergency\ urgent symptoms.   - The patient was advised to call back or seek an in-person evaluation if the symptoms worsen or if the condition fails to improve as anticipated.   No follow-ups on file.    Orders Placed This Encounter  Procedures  . Ambulatory referral to Psychiatry  . Ambulatory referral to Psychology    Meds ordered this encounter  Medications  . zolpidem (AMBIEN CR) 12.5 MG CR tablet    Sig: Take 1 tablet (12.5 mg total) by mouth at bedtime as needed. for sleep    Dispense:  90 tablet    Refill:  1  . losartan (COZAAR) 25 MG tablet    Sig: 1 po qd    Dispense:  90 tablet    Refill:  0  . metFORMIN (GLUCOPHAGE) 500 MG tablet    Sig: Start with 1/2 tab bid for 1-2 wks, then inc as tol to 1 po bid    Dispense:  180 tablet    Refill:  0    Medications Discontinued During This Encounter  Medication Reason  . naproxen (NAPROSYN) 500 MG tablet Error  . cyclobenzaprine (FLEXERIL) 10 MG tablet Error  . zolpidem (AMBIEN CR) 12.5 MG CR tablet Reorder  . lisinopril (ZESTRIL) 5 MG tablet Change in therapy  . buPROPion (WELLBUTRIN XL) 150 MG 24 hr tablet Side effect (s)  . metFORMIN (GLUCOPHAGE) 500 MG tablet      I provided 26 minutes of non face-to-face time during this encounter.  Additional time was spent with charting and coordination of care before and after the actual visit commenced.   Note:  This note was prepared with assistance of Dragon voice recognition software. Occasional wrong-word or sound-a-like substitutions may have occurred due to the inherent limitations of voice recognition software.   This document serves  as a record of services personally performed by Mercedes Dance, DO. It was created on her behalf by Mercedes Webb, a trained medical scribe. The creation of this record is based on the  scribe's personal observations and the provider's statements to them.   This case required medical decision making of at least moderate complexity.  This document serves as a record of services personally performed by Mercedes Dance, DO. It was created on her behalf by Mercedes Webb, a trained medical scribe. The creation of this record is based on the scribe's personal observations and the provider's statements to them.   The above documentation from Mercedes Webb, medical scribe, has been reviewed by Marjory Sneddon, D.O.        Patient Care Team    Relationship Specialty Notifications Start End  Mercedes Dance, DO PCP - General Family Medicine  11/26/16   Defrancesco, Alanda Slim, MD Consulting Physician Obstetrics and Gynecology  11/12/16   Joylene Igo, CNM Midwife Obstetrics and Gynecology  11/12/16   Danella Sensing, MD Consulting Physician Dermatology  11/12/16   Richmond Campbell, MD Consulting Physician Gastroenterology  11/26/16   Carol Ada, MD Consulting Physician Gastroenterology  11/26/16   Keene Breath., MD  Ophthalmology  11/26/16   Joylene Igo, CNM Midwife Obstetrics and Gynecology  11/26/16    Comment: Prescribes her Seroquel     -Vitals obtained; medications/ allergies reconciled;  personal medical, social, Sx etc.histories were updated by CMA, reviewed by me and are reflected in chart   Patient Active Problem List   Diagnosis Date Noted  . Mood disorder ([place])- mixed anxiety and depression 01/20/2018  . Obesity, Class I, BMI 30-34.9 06/10/2017  . Primary insomnia 11/12/2016  . Adjustment disorder with mixed anxiety and depressed mood 11/12/2016  . Elevated cholesterol with elevated triglycerides 09/28/2016  . Hypertension associated with diabetes ([place]) 01/31/2016  . Generalized anxiety disorder 01/31/2016  . Postmenopausal- 30 yrs ago TAH "yrs ago" 11/12/2016  . Hormone imbalance- txed by gyn 11/12/2016  . Mixed hyperlipidemia 11/12/2016   . Hypokalemia 02/01/2016  . Vitamin D deficiency 09/28/2016  . Status post abdominal hysterectomy 12/23/2014  . Microalbuminuria due to type 2 diabetes mellitus ([place]) 06/17/2019  . Mixed diabetic hyperlipidemia associated with type 2 diabetes mellitus ([place]) 10/23/2018  . Diabetes mellitus ([place]) 10/23/2018  . Secondary hyperhidrosis- not focal in nature but primarily from waist up is the worst 10/23/2018  . Acute stress reaction 08/20/2018  . Excess, secretion, sweat 08/20/2018  . Low serum progesterone 05/30/2018  . Panic attack 01/20/2018  . Excessive sweating- likely due to hormonal 11/11/2017  . Inactivity 11/11/2017  . Elevated serum creatinine 09/04/2017  . Elevated ALT measurement 09/04/2017  . Acute biliary pancreatitis 01/31/2016  . Overweight 12/23/2014  . Surgical menopause 12/23/2014  . History of cervical dysplasia 12/23/2014  . SUI (stress urinary incontinence, female) 12/23/2014     Current Meds  Medication Sig  . Ascorbic Acid (VITAMIN C) 1000 MG tablet Take 1,000 mg by mouth daily.  Marland Kitchen atorvastatin (LIPITOR) 10 MG tablet TAKE 1 TABLET (10 MG TOTAL) BY MOUTH NIGHTLY.  . blood glucose meter kit and supplies Dispense based on patient and insurance preference. Use to check fasting blood sugar once daily.  . Calcium Carbonate (CALTRATE 600 PO) Take 1 tablet by mouth daily.  . Multiple Vitamins-Minerals (CENTRUM SILVER PO) Take 1 tablet by mouth daily.  Marland Kitchen omega-3 acid ethyl esters (LOVAZA) 1 g capsule Take 2 capsules (2 g total) by mouth  2 (two) times daily.  . potassium chloride (KLOR-CON) 10 MEQ tablet TAKE 1 TABLET BY MOUTH DAILY.  . QC LO-DOSE ASPIRIN 81 MG EC tablet TAKE 1 TABLET (81 MG TOTAL) BY MOUTH DAILY.  Marland Kitchen triamterene-hydrochlorothiazide (MAXZIDE-25) 37.5-25 MG tablet TAKE 1 TABLET BY MOUTH DAILY.  . valACYclovir (VALTREX) 500 MG tablet TAKE 1 TABLET BY MOUTH DAILY. **NEED OFFICE VISIT FOR FURTHER REFILLS**  . Vitamin D, Ergocalciferol, (DRISDOL) 1.25 MG  (50000 UNIT) CAPS capsule Take 1 capsule (50,000 Units total) by mouth every 7 (seven) days.  Marland Kitchen zolpidem (AMBIEN CR) 12.5 MG CR tablet Take 1 tablet (12.5 mg total) by mouth at bedtime as needed. for sleep  . [DISCONTINUED] zolpidem (AMBIEN CR) 12.5 MG CR tablet Take 1 tablet (12.5 mg total) by mouth at bedtime as needed. for sleep     Allergies:  Allergies  Allergen Reactions  . Prozac [Fluoxetine Hcl] Other (See Comments)    sweating     ROS:  See above HPI for pertinent positives and negatives   Objective:   Blood pressure 136/78, height _0  (1.702 m), weight 195 lb (88.5 kg).  (if some vitals are omitted, this means that patient was UNABLE to obtain them even though they were asked to get them prior to OV today.  They were asked to call us at their earliest convenience with these once obtained. )  General: A & O * 3; sounds in no acute distress; in usual state of health.  Skin: Pt confirms warm and dry extremities and pink fingertips HEENT: Pt confirms lips non-cyanotic Chest: Patient confirms normal chest excursion and movement Respiratory: speaking in full sentences, no conversational dyspnea; patient confirms no use of accessory muscles Psych: insight appears good, mood- appears full

## 2019-06-24 ENCOUNTER — Other Ambulatory Visit: Payer: Self-pay | Admitting: Family Medicine

## 2019-06-24 DIAGNOSIS — B009 Herpesviral infection, unspecified: Secondary | ICD-10-CM

## 2019-06-29 ENCOUNTER — Telehealth: Payer: Self-pay | Admitting: Family Medicine

## 2019-06-29 NOTE — Telephone Encounter (Signed)
Called [facility]- the issue is that patient had med filled recently and insurance isnt going to cover until 07/02/19. Patient is aware med was sent and received at pharmacy and that she needs to contact [facility] to discuss issues with insurance with them. Patient verbalized understanding. AS< CMA

## 2019-06-29 NOTE — Telephone Encounter (Signed)
Patient is requesting a call back from clinic staff to discuss medications. She said that at last OV a few weeks ago Dr. Jenetta Downer changed her meds up but has not heard from [place] about any new orders pending. She would like to discuss her medications to make sure everything is accurate and that she has the meds she is supposed to be taking. Please advise

## 2019-07-08 ENCOUNTER — Ambulatory Visit (INDEPENDENT_AMBULATORY_CARE_PROVIDER_SITE_OTHER): Payer: Medicare Other | Admitting: Psychology

## 2019-07-08 DIAGNOSIS — F331 Major depressive disorder, recurrent, moderate: Secondary | ICD-10-CM

## 2019-07-16 ENCOUNTER — Ambulatory Visit (HOSPITAL_COMMUNITY): Payer: Medicare Other | Admitting: Psychiatry

## 2019-07-21 ENCOUNTER — Other Ambulatory Visit: Payer: Self-pay

## 2019-07-21 ENCOUNTER — Encounter: Payer: Self-pay | Admitting: Family Medicine

## 2019-07-21 ENCOUNTER — Ambulatory Visit (INDEPENDENT_AMBULATORY_CARE_PROVIDER_SITE_OTHER): Payer: Medicare Other | Admitting: Family Medicine

## 2019-07-21 ENCOUNTER — Other Ambulatory Visit: Payer: Self-pay | Admitting: Family Medicine

## 2019-07-21 ENCOUNTER — Telehealth: Payer: Self-pay | Admitting: Family Medicine

## 2019-07-21 VITALS — BP 112/71 | HR 97 | Temp 98.2°F | Resp 12 | Ht 67.0 in | Wt 193.8 lb

## 2019-07-21 DIAGNOSIS — E1169 Type 2 diabetes mellitus with other specified complication: Secondary | ICD-10-CM | POA: Diagnosis not present

## 2019-07-21 DIAGNOSIS — R809 Proteinuria, unspecified: Secondary | ICD-10-CM

## 2019-07-21 DIAGNOSIS — F39 Unspecified mood [affective] disorder: Secondary | ICD-10-CM

## 2019-07-21 DIAGNOSIS — E1129 Type 2 diabetes mellitus with other diabetic kidney complication: Secondary | ICD-10-CM | POA: Diagnosis not present

## 2019-07-21 DIAGNOSIS — E782 Mixed hyperlipidemia: Secondary | ICD-10-CM

## 2019-07-21 DIAGNOSIS — I152 Hypertension secondary to endocrine disorders: Secondary | ICD-10-CM

## 2019-07-21 DIAGNOSIS — E1159 Type 2 diabetes mellitus with other circulatory complications: Secondary | ICD-10-CM | POA: Diagnosis not present

## 2019-07-21 DIAGNOSIS — I10 Essential (primary) hypertension: Secondary | ICD-10-CM

## 2019-07-21 DIAGNOSIS — F5101 Primary insomnia: Secondary | ICD-10-CM

## 2019-07-21 DIAGNOSIS — B009 Herpesviral infection, unspecified: Secondary | ICD-10-CM

## 2019-07-21 MED ORDER — METFORMIN HCL 1000 MG PO TABS: ORAL_TABLET | ORAL | 0 refills | Status: DC

## 2019-07-21 MED ORDER — TRAZODONE HCL 100 MG PO TABS: 50.0000 mg | ORAL_TABLET | Freq: Every evening | ORAL | 0 refills | Status: DC | PRN

## 2019-07-21 MED ORDER — VALACYCLOVIR HCL 500 MG PO TABS: ORAL_TABLET | ORAL | 0 refills | Status: DC

## 2019-07-21 NOTE — Patient Instructions (Signed)
Let me know about tradodone.     Diabetes Mellitus and Standards of Medical Care  Managing diabetes (diabetes mellitus) can be complicated. Your diabetes treatment may be managed by a team of health care providers, including:  A diet and nutrition specialist (registered dietitian).  A nurse.  A certified diabetes educator (CDE).  A diabetes specialist (endocrinologist).  An eye doctor.  A primary care provider.  A dentist.  Your health care providers follow a schedule in order to help you get the best quality of care. The following schedule is a general guideline for your diabetes management plan. Your health care providers may also give you more specific instructions.  HbA1c (hemoglobin A1c) test This test provides information about blood sugar (glucose) control over the previous 2-3 months. It is used to check whether your diabetes management plan needs to be adjusted.  If you are meeting your treatment goals, this test is done at least 2 times a year.  If you are not meeting treatment goals or if your treatment goals have changed, this test is done 4 times a year.  Blood pressure test  This test is done at every routine medical visit. For most people, the goal is less than 130/80. Ask your health care provider what your goal blood pressure should be.  Dental and eye exams  Visit your dentist two times a year.  If you have type 1 diabetes, get an eye exam 3-5 years after you are diagnosed, and then once a year after your first exam. ? If you were diagnosed with type 1 diabetes as a child, get an eye exam when you are age 78 or older and have had diabetes for 3-5 years. After the first exam, you should get an eye exam once a year.  If you have type 2 diabetes, have an eye exam as soon as you are diagnosed, and then once a year after your first exam.  Foot care exam  Visual foot exams are done at every routine medical visit. The exams check for cuts, bruises, redness,  blisters, sores, or other problems with the feet.  A complete foot exam is done by your health care provider once a year. This exam includes an inspection of the structure and skin of your feet, and a check of the pulses and sensation in your feet. ? Type 1 diabetes: Get your first exam 3-5 years after diagnosis. ? Type 2 diabetes: Get your first exam as soon as you are diagnosed.  Check your feet every day for cuts, bruises, redness, blisters, or sores. If you have any of these or other problems that are not healing, contact your health care provider.  Kidney function test (urine microalbumin)  This test is done once a year. ? Type 1 diabetes: Get your first test 5 years after diagnosis. ? Type 2 diabetes: Get your first test as soon as you are diagnosed._  If you have chronic kidney disease (CKD), get a serum creatinine and estimated glomerular filtration rate (eGFR) test once a year.  Lipid profile (cholesterol, HDL, LDL, triglycerides)  This test should be done when you are diagnosed with diabetes, and every 5 years after the first test. If you are on medicines to lower your cholesterol, you may need to get this test done every year. ? The goal for LDL is less than 100 mg/dL (5.5 mmol/L). If you are at high risk, the goal is less than 70 mg/dL (3.9 mmol/L). ? The goal for HDL is 40  mg/dL (2.2 mmol/L) for men and 50 mg/dL(2.8 mmol/L) for women. An HDL cholesterol of 60 mg/dL (3.3 mmol/L) or higher gives some protection against heart disease. ? The goal for triglycerides is less than 150 mg/dL (8.3 mmol/L).  Immunizations  The yearly flu (influenza) vaccine is recommended for everyone 6 months or older who has diabetes.  The pneumonia (pneumococcal) vaccine is recommended for everyone 2 years or older who has diabetes. If you are 21 or older, you may get the pneumonia vaccine as a series of two separate shots.  The hepatitis B vaccine is recommended for adults shortly after they have  been diagnosed with diabetes.  The Tdap (tetanus, diphtheria, and pertussis) vaccine should be given: ? According to normal childhood vaccination schedules, for children. ? Every 10 years, for adults who have diabetes.  The shingles vaccine is recommended for people who have had chicken pox and are 50 years or older.  Mental and emotional health  Screening for symptoms of eating disorders, anxiety, and depression is recommended at the time of diagnosis and afterward as needed. If your screening shows that you have symptoms (you have a positive screening result), you may need further evaluation and be referred to a mental health care provider.  Diabetes self-management education  Education about how to manage your diabetes is recommended at diagnosis and ongoing as needed.  Treatment plan  Your treatment plan will be reviewed at every medical visit.  Summary  Managing diabetes (diabetes mellitus) can be complicated. Your diabetes treatment may be managed by a team of health care providers.  Your health care providers follow a schedule in order to help you get the best quality of care.  Standards of care including having regular physical exams, blood tests, blood pressure monitoring, immunizations, screening tests, and education about how to manage your diabetes.  Your health care providers may also give you more specific instructions based on your individual health.      Type 2 Diabetes Mellitus, Self Care, Adult Caring for yourself after you have been diagnosed with type 2 diabetes (type 2 diabetes mellitus) means keeping your blood sugar (glucose) under control with a balance of:  Nutrition.  Exercise.  Lifestyle changes.  Medicines or insulin, if necessary.  Support from your team of health care providers and others.  The following information explains what you need to know to manage your diabetes at home. What do I need to do to manage my blood glucose?  Check  your blood glucose every day, as often as told by your health care provider.  Contact your health care provider if your blood glucose is above your target for 2 tests in a row.  Have your A1c (hemoglobin A1c) level checked at least two times a year, or as often as told by your health care provider. Your health care provider will set individualized treatment goals for you. Generally, the goal of treatment is to maintain the following blood glucose levels:  Before meals (preprandial): 80-130 mg/dL (4.4-7.2 mmol/L).  After meals (postprandial): below 180 mg/dL (10 mmol/L).  A1c level: less than 7%.  What do I need to know about hyperglycemia and hypoglycemia? What is hyperglycemia? Hyperglycemia, also called high blood glucose, occurs when blood glucose is too high.Make sure you know the early signs of hyperglycemia, such as:  Increased thirst.  Hunger.  Feeling very tired.  Needing to urinate more often than usual.  Blurry vision.  What is hypoglycemia? Hypoglycemia, also called low blood glucose, occurswith a blood  glucose level at or below 70 mg/dL (3.9 mmol/L). The risk for hypoglycemia increases during or after exercise, during sleep, during illness, and when skipping meals or not eating for a long time (fasting). It is important to know the symptoms of hypoglycemia and treat it right away. Always have a 15-gram rapid-acting carbohydrate snack with you to treat low blood glucose. Family members and close friends should also know the symptoms and should understand how to treat hypoglycemia, in case you are not able to treat yourself. What are the symptoms of hypoglycemia? Hypoglycemia symptoms can include:  Hunger.  Anxiety.  Sweating and feeling clammy.  Confusion.  Dizziness or feeling light-headed.  Sleepiness.  Nausea.  Increased heart rate.  Headache.  Blurry vision.  Seizure.  Nightmares.  Tingling or numbness around the mouth, lips, or tongue.  A  change in speech.  Decreased ability to concentrate.  A change in coordination.  Restless sleep.  Tremors or shakes.  Fainting.  Irritability.  How do I treat hypoglycemia?  If you are alert and able to swallow safely, follow the 15:15 rule:  Take 15 grams of a rapid-acting carbohydrate. Rapid-acting options include: ? 1 tube of glucose gel. ? 3 glucose pills. ? 6-8 pieces of hard candy. ? 4 oz (120 mL) of fruit juice. ? 4 oz (120 mL) of regular (not diet) soda.  Check your blood glucose 15 minutes after you take the carbohydrate.  If the repeat blood glucose level is still at or below 70 mg/dL (3.9 mmol/L), take 15 grams of a carbohydrate again.  If your blood glucose level does not increase above 70 mg/dL (3.9 mmol/L) after 3 tries, seek emergency medical care.  After your blood glucose level returns to normal, eat a meal or a snack within 1 hour.  How do I treat severe hypoglycemia? Severe hypoglycemia is when your blood glucose level is at or below 54 mg/dL (3 mmol/L). Severe hypoglycemia is an emergency. Do not wait to see if the symptoms will go away. Get medical help right away. Call your local emergency services (911 in the [place] Do not drive yourself to the hospital. If you have severe hypoglycemia and you cannot eat or drink, you may need an injection of glucagon. A family member or close friend should learn how to check your blood glucose and how to give you a glucagon injection. Ask your health care provider if you need to have an emergency glucagon injection kit available. Severe hypoglycemia may need to be treated in a hospital. The treatment may include getting glucose through an IV tube. You may also need treatment for the cause of your hypoglycemia. Can having diabetes put me at risk for other conditions? Having diabetes can put you at risk for other long-term (chronic) conditions, such as heart disease and kidney disease. Your health care provider may  prescribe medicines to help prevent complications from diabetes. These medicines may include:  Aspirin.  Medicine to lower cholesterol.  Medicine to control blood pressure.  What else can I do to manage my diabetes? Take your diabetes medicines as told  If your health care provider prescribed insulin or diabetes medicines, take them every day.  Do not run out of insulin or other diabetes medicines that you take. Plan ahead so you always have these available.  If you use insulin, adjust your dosage based on how physically active you are and what foods you eat. Your health care provider will tell you how to adjust your dosage. Make  healthy food choices  The things that you eat and drink affect your blood glucose and your insulin dosage. Making good choices helps to control your diabetes and prevent other health problems. A healthy meal plan includes eating lean proteins, complex carbohydrates, fresh fruits and vegetables, low-fat dairy products, and healthy fats. Make an appointment to see a diet and nutrition specialist (registered dietitian) to help you create an eating plan that is right for you. Make sure that you:  Follow instructions from your health care provider about eating or drinking restrictions.  Drink enough fluid to keep your urine clear or pale yellow.  Eat healthy snacks between nutritious meals.  Track the carbohydrates that you eat. Do this by reading food labels and learning the standard serving sizes of foods.  Follow your sick day plan whenever you cannot eat or drink as usual. Make this plan in advance with your health care provider.  Stay active  Exercise regularly, as told by your health care provider. This may include:  Stretching and doing strength exercises, such as yoga or weightlifting, at least 2 times a week.  Doing at least 150 minutes of moderate-intensity or vigorous-intensity exercise each week. This could be brisk walking, biking, or water  aerobics. ? Spread out your activity over at least 3 days of the week. ? Do not go more than 2 days in a row without doing some kind of physical activity.  When you start a new exercise or activity, work with your health care provider to adjust your insulin, medicines, or food intake as needed. Make healthy lifestyle choices  Do not use any tobacco products, such as cigarettes, chewing tobacco, and e-cigarettes. If you need help quitting, ask your health care provider.  If your health care provider says that alcohol is safe for you, limit alcohol intake to no more than 1 drink per day for nonpregnant women and 2 drinks per day for men. One drink equals 12 oz of beer, 5 oz of wine, or 1 oz of hard liquor.  Learn to manage stress. If you need help with this, ask your health care provider. Care for your body   Keep your immunizations up to date. In addition to getting vaccinations as told by your health care provider, it is recommended that you get vaccinated against the following illnesses: ? The flu (influenza). Get a flu shot every year. ? Pneumonia. ? Hepatitis B.  Schedule an eye exam soon after your diagnosis, and then one time every year after that.  Check your skin and feet every day for cuts, bruises, redness, blisters, or sores. Schedule a foot exam with your health care provider once every year.  Brush your teeth and gums two times a day, and floss at least one time a day. Visit your dentist at least once every 6 months.  Maintain a healthy weight. General instructions  Take over-the-counter and prescription medicines only as told by your health care provider.  Share your diabetes management plan with people in your workplace, school, and household.  Check your urine for ketones when you are ill and as told by your health care provider.  Ask your health care provider: ? Do I need to meet with a diabetes educator? ? Where can I find a support group for people with  diabetes?  Carry a medical alert card or wear medical alert jewelry.  Keep all follow-up visits as told by your health care provider. This is important. Where to find more information: For more  information about diabetes, visit:  American Diabetes Association (ADA): www.diabetes.org  American Association of Diabetes Educators (AADE): www.diabeteseducator.org/patient-resources  This information is not intended to replace advice given to you by your health care provider. Make sure you discuss any questions you have with your health care provider. Document Released: 08/08/2015 Document Revised: 09/22/2015 Document Reviewed: 05/20/2015 Elsevier Interactive Patient Education  2017 [place].      Blood Glucose Monitoring, Adult Monitoring your blood sugar (glucose) helps you manage your diabetes. It also helps you and your health care provider determine how well your diabetes management plan is working. Blood glucose monitoring involves checking your blood glucose as often as directed, and keeping a record (log) of your results over time. Why should I monitor my blood glucose? Checking your blood glucose regularly can:  Help you understand how food, exercise, illnesses, and medicines affect your blood glucose.  Let you know what your blood glucose is at any time. You can quickly tell if you are having low blood glucose (hypoglycemia) or high blood glucose (hyperglycemia).  Help you and your health care provider adjust your medicines as needed.  When should I check my blood glucose? Follow instructions from your health care provider about how often to check your blood glucose.   This may depend on:  The type of diabetes you have.  How well-controlled your diabetes is.  Medicines you are taking.  If you have type 1 diabetes:  Check your blood glucose at least 2 times a day.  Also check your blood glucose: ? Before every insulin injection. ? Before and after  exercise. ? Between meals. ? 2 hours after a meal. ? Occasionally between 2:00 a.m. and 3:00 a.m., as directed. ? Before potentially dangerous tasks, like driving or using heavy machinery. ? At bedtime.  You may need to check your blood glucose more often, up to 6-10 times a day: ? If you use an insulin pump. ? If you need multiple daily injections (MDI). ? If your diabetes is not well-controlled. ? If you are ill. ? If you have a history of severe hypoglycemia. ? If you have a history of not knowing when your blood glucose is getting low (hypoglycemia unawareness).  If you have type 2 diabetes:  If you take insulin or other diabetes medicines, check your blood glucose at least 2 times a day.  If you are on intensive insulin therapy, check your blood glucose at least 4 times a day. Occasionally, you may also need to check between 2:00 a.m. and 3:00 a.m., as directed.  Also check your blood glucose: ? Before and after exercise. ? Before potentially dangerous tasks, like driving or using heavy machinery.  You may need to check your blood glucose more often if: ? Your medicine is being adjusted. ? Your diabetes is not well-controlled. ? You are ill.  What is a blood glucose log?  A blood glucose log is a record of your blood glucose readings. It helps you and your health care provider: ? Look for patterns in your blood glucose over time. ? Adjust your diabetes management plan as needed.  Every time you check your blood glucose, write down your result and notes about things that may be affecting your blood glucose, such as your diet and exercise for the day.  Most glucose meters store a record of glucose readings in the meter. Some meters allow you to download your records to a computer. How do I check my blood glucose? Follow these steps  to get accurate readings of your blood glucose: Supplies needed   Blood glucose meter.  Test strips for your meter. Each meter has its own  strips. You must use the strips that come with your meter.  A needle to prick your finger (lancet). Do not use lancets more than once.  A device that holds the lancet (lancing device).  A journal or log book to write down your results.  Procedure  Wash your hands with soap and water.  Prick the side of your finger (not the tip) with the lancet. Use a different finger each time.  Gently rub the finger until a small drop of blood appears.  Follow instructions that come with your meter for inserting the test strip, applying blood to the strip, and using your blood glucose meter.  Write down your result and any notes.  Alternative testing sites  Some meters allow you to use areas of your body other than your finger (alternative sites) to test your blood.  If you think you may have hypoglycemia, or if you have hypoglycemia unawareness, do not use alternative sites. Use your finger instead.  Alternative sites may not be as accurate as the fingers, because blood flow is slower in these areas. This means that the result you get may be delayed, and it may be different from the result that you would get from your finger.  The most common alternative sites are: ? Forearm. ? Thigh. ? Palm of the hand.  Additional tips  Always keep your supplies with you.  If you have questions or need help, all blood glucose meters have a 24-hour "hotline" number that you can call. You may also contact your health care provider.  After you use a few boxes of test strips, adjust (calibrate) your blood glucose meter by following instructions that came with your meter.    The American Diabetes Association suggests the following targets for most nonpregnant adults with diabetes.  More or less stringent glycemic goals may be appropriate for each individual.  A1C: Less than 7% A1C may also be reported as eAG: Less than 154 mg/dl Before a meal (preprandial plasma glucose): 80-130 mg/dl 1-2 hours after  beginning of the meal (Postprandial plasma glucose)*: Less than 180 mg/dl  *Postprandial glucose may be targeted if A1C goals are not met despite reaching preprandial glucose goals.   GOALS in short:  The goals are for the Hgb A1C to be less than 7.0 & blood pressure to be less than 130/80.    It is recommended that all diabetics are educated on and follow a healthy diabetic diet, exercise for 30 minutes 3-4 times per week (walking, biking, swimming, or machine), monitor blood glucose readings and bring that record with you to be reviewed at your next office visit.     You should be checking fasting blood sugars- especially after you eat poorly or eat really healthy, and also check 2 hour postprandial blood sugars after largest meal of the day.    Write these down and bring in your log at each office visit.    You will need to be seen every 3 months by the provider managing your Diabetes unless told otherwise by that provider.   You will need yearly eye exams from an eye specialist and foot exams to check the nerves of your feet.  Also, your urine should be checked yearly as well to make sure excess protein is not present.   If you are checking your blood pressure  at home, please record it and bring it to your next office visit.    Follow the Dietary Approaches to Stop Hypertension (DASH) diet (3 servings of fruit and vegetables daily, whole grains, low sodium, low-fat proteins).  See below.    Lastly, when it comes to your cholesterol, the goal is to have the HDL (good cholesterol) >40, and the LDL (bad cholesterol) <100.   It is recommended that you follow a heart healthy, low saturated and trans-fat diet and exercise for 30 minutes at least 5 times a week.     (( Check out the DASH diet = 1.5 Gram Low Sodium Diet   A 1.5 gram sodium diet restricts the amount of sodium in the diet to no more than 1.5 g or 1500 mg daily.  The American Heart Association recommends Americans over the age of  76 to consume no more than 1500 mg of sodium each day to reduce the risk of developing high blood pressure.  Research also shows that limiting sodium may reduce heart attack and stroke risk.  Many foods contain sodium for flavor and sometimes as a preservative.  When the amount of sodium in a diet needs to be low, it is important to know what to look for when choosing foods and drinks.  The following includes some information and guidelines to help make it easier for you to adapt to a low sodium diet.    QUICK TIPS  Do not add salt to food.  Avoid convenience items and fast food.  Choose unsalted snack foods.  Buy lower sodium products, often labeled as "lower sodium" or "no salt added."  Check food labels to learn how much sodium is in 1 serving.  When eating at a restaurant, ask that your food be prepared with less salt or none, if possible.    READING FOOD LABELS FOR SODIUM INFORMATION  The nutrition facts label is a good place to find how much sodium is in foods. Look for products with no more than 400 mg of sodium per serving.  Remember that 1.5 g = 1500 mg.  The food label may also list foods as:  Sodium-free: Less than 5 mg in a serving.  Very low sodium: 35 mg or less in a serving.  Low-sodium: 140 mg or less in a serving.  Light in sodium: 50% less sodium in a serving. For example, if a food that usually has 300 mg of sodium is changed to become light in sodium, it will have 150 mg of sodium.  Reduced sodium: 25% less sodium in a serving. For example, if a food that usually has 400 mg of sodium is changed to reduced sodium, it will have 300 mg of sodium.    CHOOSING FOODS  Grains  Avoid: Salted crackers and snack items. Some cereals, including instant hot cereals. Bread stuffing and biscuit mixes. Seasoned rice or pasta mixes.  Choose: Unsalted snack items. Low-sodium cereals, oats, puffed wheat and rice, shredded wheat. English muffins and bread. Pasta.  Meats  Avoid: Salted,  canned, smoked, spiced, pickled meats, including fish and poultry. Bacon, ham, sausage, cold cuts, hot dogs, anchovies.  Choose: Low-sodium canned tuna and salmon. Fresh or frozen meat, poultry, and fish.  Dairy  Avoid: Processed cheese and spreads. Cottage cheese. Buttermilk and condensed milk. Regular cheese.  Choose: Milk. Low-sodium cottage cheese. Yogurt. Sour cream. Low-sodium cheese.  Fruits and Vegetables  Avoid: Regular canned vegetables. Regular canned tomato sauce and paste. Frozen vegetables in sauces. Olives.  Angie Fava. Relishes. Sauerkraut.  Choose: Low-sodium canned vegetables. Low-sodium tomato sauce and paste. Frozen or fresh vegetables. Fresh and frozen fruit.  Condiments  Avoid: Canned and packaged gravies. Worcestershire sauce. Tartar sauce. Barbecue sauce. Soy sauce. Steak sauce. Ketchup. Onion, garlic, and table salt. Meat flavorings and tenderizers.  Choose: Fresh and dried herbs and spices. Low-sodium varieties of mustard and ketchup. Lemon juice. Tabasco sauce. Horseradish.    SAMPLE 1.5 GRAM SODIUM MEAL PLAN:   Breakfast / Sodium (mg)  1 cup low-fat milk / 143 mg  1 whole-wheat English muffin / 240 mg  1 tbs heart-healthy margarine / 153 mg  1 hard-boiled egg / 139 mg  1 small orange / 0 mg  Lunch / Sodium (mg)  1 cup raw carrots / 76 mg  2 tbs no salt added peanut butter / 5 mg  2 slices whole-wheat bread / 270 mg  1 tbs jelly / 6 mg   cup red grapes / 2 mg  Dinner / Sodium (mg)  1 cup whole-wheat pasta / 2 mg  1 cup low-sodium tomato sauce / 73 mg  3 oz lean ground beef / 57 mg  1 small side salad (1 cup raw spinach leaves,  cup cucumber,  cup yellow bell pepper) with 1 tsp olive oil and 1 tsp red wine vinegar / 25 mg  Snack / Sodium (mg)  1 container low-fat vanilla yogurt / 107 mg  3 graham cracker squares / 127 mg  Nutrient Analysis  Calories: 1745  Protein: 75 g  Carbohydrate: 237 g  Fat: 57 g  Sodium: 1425 mg  Document Released: 04/16/2005  Document Revised: 12/27/2010 Document Reviewed: 07/18/2009  ExitCare Patient Information 2012 [place].))    This information is not intended to replace advice given to you by your health care provider. Make sure you discuss any questions you have with your health care provider. Document Released: 04/19/2003 Document Revised: 11/04/2015 Document Reviewed: 09/26/2015 Elsevier Interactive Patient Education  2017 [employer].

## 2019-07-21 NOTE — Telephone Encounter (Signed)
Tell patient to discontinue Ambien at this time and to start the trazodone as prescribed.  Tell her to follow-up sooner than planned if this does not work well and if she has any concerns or questions

## 2019-07-21 NOTE — Progress Notes (Signed)
Impression and Recommendations:    1. Type 2 diabetes mellitus with other specified complication, without long-term current use of insulin ([place])   2. Hypertension associated with diabetes ([place])   3. Mixed diabetic hyperlipidemia associated with type 2 diabetes mellitus ([place])   4. Mood disorder ([place])- mixed anxiety and depression   5. Microalbuminuria due to type 2 diabetes mellitus ([place])   6. HSV (herpes simplex virus) infection      Type 2 diabetes mellitus with other specified complication, without long-term current use of insulin - Last A1c done on 06/04/2019, measured at 7.5.  - Discussed that patient's home blood sugars are currently suboptimally controlled.  - Counseled patient on pathophysiology of disease and discussed various treatment options, which always includes dietary and lifestyle modification as first line.    - Referral to diabetic nutritionist provided today.  See orders. - Encouraged patient to eat a high fiber and high protein component with each meal.  - Recommended increasing patient's dose of metformin to 1000 mg BID  - Importance of low carb, heart-healthy diet discussed with patient in addition to regular aerobic exercise of 70mn 5d/week or more.   - Check FBS and postprandial sugars 2 hours after the biggest meal of your day.  Keep log and bring in next OV for my review.     - Also told patient if you ever feel poorly, please check your blood pressure and blood sugar, as one or the other could be the cause of your symptoms.  - Pt reminded about need for yearly eye and foot exams.  Told patient to make appt.for diabetic eye exam, CMAs here will do foot exams  - We will continue to monitor.    Microalbuminuria due to type 2 DM - Last urine check 06/04/2019 positive for microalbuminuria.  - Encouraged patient to work on controlling her blood sugar and blood pressure to help prevent kidney damage leading to excess protein in the  urine.    Hypertension associated with type 2 DM - Blood pressure currently is at goal, controlled.  - Patient will continue current treatment regimen.  See med list.  - Counseled patient on pathophysiology of disease and discussed various treatment options, which always includes dietary and lifestyle modification as first line.   - Lifestyle changes such as dash and heart healthy diets and engaging in a regular exercise program discussed extensively with patient.   - Ambulatory blood pressure monitoring encouraged at least 3 times weekly.  Keep log and bring in every office visit.  Reminded patient that if they ever feel poorly in any way, to check their blood pressure and pulse.  - We will continue to monitor.    Mixed diabetic hyperlipidemia associated with type 2 DM - Triglycerides elevated last check.  - Pt will continue current treatment regimen.  See med list.  - Ongoing prudent dietary changes such as low saturated & trans fat diets for hyperlipidemia and low carb diets for hypertriglyceridemia discussed with patient.    - Encouraged patient to follow AHA guidelines for regular exercise and also engage in weight loss if BMI above 25.   - We will continue to monitor.    Mood disorder - mixed anxiety and depression  - Patient discontinued Wellbutrin due to side-effects since last OV.  - Encouraged patient to continue to follow up with Behavioral Health as established. - If desired, encouraged patient to follow up with psychiatrist as recommended.  - Reviewed the "  spokes of the wheel" of mood and health management.  Stressed the importance of ongoing prudent habits, including regular exercise, appropriate sleep hygiene, healthful dietary habits, and prayer/meditation to relax.  - If patient desires in the future, will consider beginning trazodone for sleep assistance.  Patient knows to call her pharmacy and ask if she has ever tried this in the past, and update the  clinic of her findings via [place].  - Will continue to monitor.     Orders Placed This Encounter  Procedures  . Hemoglobin A1c  . Comprehensive metabolic panel  . Ambulatory referral to diabetic education    Meds ordered this encounter  Medications  . metFORMIN (GLUCOPHAGE) 1000 MG tablet    Sig: Take 1 tab q 12 hrs with meals    Dispense:  180 tablet    Refill:  0  . valACYclovir (VALTREX) 500 MG tablet    Sig: TAKE 1 TABLET BY MOUTH DAILY.    Dispense:  90 tablet    Refill:  0    Medications Discontinued During This Encounter  Medication Reason  . metFORMIN (GLUCOPHAGE) 500 MG tablet Reorder  . valACYclovir (VALTREX) 500 MG tablet Reorder      Please see AVS handed out to patient at the end of our visit for further patient instructions/ counseling done pertaining to today's office visit.   Return for repeat A1c and blood work end of April at [facility] with me.     Note:  This note was prepared with assistance of Dragon voice recognition software. Occasional wrong-word or sound-a-like substitutions may have occurred due to the inherent limitations of voice recognition software.   The [place] was signed into law in 2016 which includes the topic of electronic health records.  This provides immediate access to information in MyChart.  This includes consultation notes, operative notes, office notes, lab results and pathology reports.  If you have any questions about what you read please let us know at your next visit or call us at the office.  We are right here with you.   This case required medical decision making of at least moderate complexity.  This document serves as a record of services personally performed by Mellody Dance, DO. It was created on her behalf by [name], a trained medical scribe. The creation of this record is based on the scribe's personal observations and the provider's statements to them.    The above documentation from  [name], medical scribe, has been reviewed by Marjory Sneddon, D.O.       --------------------------------------------------------------------------------------------------------------------------------------------------------------------------------------------------------------------------------------------    Subjective:     Mercedes Webb, am serving as scribe for Dr.Mandolin Falwell.   HPI: Mercedes Webb is a 64 y.o. female who presents to [place] at [facility] today for issues as discussed below.      Last OV 06/17/19: pt told to- "Return 4-5 weeks after starting losartan ( d/c lisinopril), restarting Metformin and referred to psychiatry and psychology."   She is tolerating all of her current medications well.   Says "I do feel better."   - Mood Management Patient stopped Wellbutrin completely, as she thought it was causing side effects.    She has seen a psychologist, Dr. Rexene Edison, through [facility], and was also recommended to Dr. Geanie Kenning of Psychiatry in [facility] who specializes in patients over 60.  ( ? )  She recently discussed some options for prescription medication with her psychologist,(?) and is  seeing her psychologist every two weeks.  "She really made me stop and think about a lot of things, and of course I cried part of the time."  Patient has been working outside in her yard on days with good weather, and told her psychologist that "Dr. Jenetta Downer wants me to walk 20 minutes per day."    Patient was told to "you have to start looking at not just the bad that has happened," and "you need to start thinking about you, what was good today, what made me laugh today, what made me smile today."  Patient was encouraged to focus more on the good than the bad, and feels this is helping.   - Sleep Pt started on ambien in 10/2017 ( which she had been on prior and it worked well for her.  Pt reminded continuously that that  med just for temporary use to restore sleep cycles).    - per the recommendation of her therapist (who does not prescribe medications), pt interested in trying trazodone.   HPI:   Diabetes Mellitus:  First diagnosed 10/20/18 w A1c 6.7 then.  - Has seen Dr Renne Crigler in recent past for hormonal concerns  Home glucose readings:   Says she is struggling with her sugars a bit right now, measuring 137-157 over the last two weeks, 157 this morning.  Says she sometimes eats a little something when she takes her medicine.   - Patient reports good compliance with medication  Patient agrees to consult with a diabetic nutritionist in the near future.  - Her denies acute concerns or problems related to treatment plan  - She denies new concerns.  Denies polyuria/polydipsia, hypo/ hyperglycemia symptoms.  Denies new onset of: chest pain, exercise intolerance, shortness of breath, dizziness, visual changes, headache, lower extremity swelling or claudication.   Last A1C in the office was:  Lab Results  Component Value Date   HGBA1C 7.5 (A) 06/04/2019   HGBA1C 6.7 (H) 10/20/2018   HGBA1C 5.6 08/29/2017   Lab Results  Component Value Date   MICROALBUR 80 06/04/2019   LDLCALC 68 06/04/2019   CREATININE 0.96 06/04/2019   BP Readings from Last 3 Encounters:  07/21/19 112/71  06/17/19 136/78  06/04/19 139/84   Wt Readings from Last 3 Encounters:  07/21/19 193 lb 12.8 oz (87.9 kg)  06/17/19 195 lb (88.5 kg)  06/04/19 195 lb 12.8 oz (88.8 kg)     HPI:  Hypertension:  -  Her blood pressure at home has been running: says it's "right on point," as long as she sits for a moment before she measures it, running around the 120's/70's-80's.  - Patient reports good compliance with medication and/or lifestyle modification  - Her denies acute concerns or problems related to treatment plan  - She denies new onset of: chest pain, exercise intolerance, shortness of breath, dizziness, visual changes,  headache, lower extremity swelling or claudication.   Last 3 blood pressure readings in our office are as follows: BP Readings from Last 3 Encounters:  07/21/19 112/71  06/17/19 136/78  06/04/19 139/84   Filed Weights   07/21/19 0816  Weight: 193 lb 12.8 oz (87.9 kg)     HPI:  Hyperlipidemia:  64 y.o. female here for cholesterol follow-up.   - Patient reports good compliance with treatment plan of:  medication and/ or lifestyle management.    - Patient denies any acute concerns or problems with management plan   - She denies new onset of: myalgias, arthralgias, increased fatigue  more than normal, chest pains, exercise intolerance, shortness of breath, dizziness, visual changes, headache, lower extremity swelling or claudication.   Most recent cholesterol panel was:  Lab Results  Component Value Date   CHOL 149 06/04/2019   HDL 45 06/04/2019   LDLCALC 68 06/04/2019   LDLDIRECT 76 06/04/2019   TRIG 223 (H) 06/04/2019   CHOLHDL 3.3 06/04/2019   Hepatic Function Latest Ref Rng & Units 06/04/2019 10/20/2018 04/24/2018  Total Protein 6.0 - 8.5 g/dL 7.9 7.3 7.3  Albumin 3.8 - 4.8 g/dL 4.9(H) 4.2 4.5  AST 0 - 40 IU/L 43(H) 58(H) 54(H)  ALT 0 - 32 IU/L 59(H) 70(H) 67(H)  Alk Phosphatase 39 - 117 IU/L 130(H) 126(H) 106  Total Bilirubin 0.0 - 1.2 mg/dL 0.5 0.4 0.3  Bilirubin, Direct 0.1 - 0.5 mg/dL - - -    Depression screen [facility] 2/9 07/21/2019 06/04/2019 04/28/2019  Decreased Interest 2 0 0  Down, Depressed, Hopeless 2 1 1   PHQ - 2 Score 4 1 1   Altered sleeping 2 1 2   Tired, decreased energy 2 1 2   Change in appetite 2 1 1   Feeling bad or failure about yourself  1 0 0  Trouble concentrating 1 0 0  Moving slowly or fidgety/restless 0 0 0  Suicidal thoughts 0 0 0  PHQ-9 Score 12 4 6   Difficult doing work/chores Somewhat difficult Not difficult at all Not difficult at all  Some recent data might be hidden   GAD 7 : Generalized Anxiety Score 07/21/2019 04/28/2019 10/23/2018  08/20/2018  Nervous, Anxious, on Edge 0 1 2 1   Control/stop worrying 1 0 2 0  Worry too much - different things 0 1 2 0  Trouble relaxing 1 1 1 1   Restless 1 1 0 0  Easily annoyed or irritable 1 1 0 2  Afraid - awful might happen 0 0 0 0  Total GAD 7 Score 4 5 7 4   Anxiety Difficulty Somewhat difficult Not difficult at all Very difficult -     Wt Readings from Last 3 Encounters:  07/21/19 193 lb 12.8 oz (87.9 kg)  06/17/19 195 lb (88.5 kg)  06/04/19 195 lb 12.8 oz (88.8 kg)   BP Readings from Last 3 Encounters:  07/21/19 112/71  06/17/19 136/78  06/04/19 139/84   Pulse Readings from Last 3 Encounters:  07/21/19 97  06/04/19 (!) 101  10/23/18 95   BMI Readings from Last 3 Encounters:  07/21/19 30.35 kg/m  06/17/19 30.54 kg/m  06/04/19 30.67 kg/m     Patient Care Team    Relationship Specialty Notifications Start End  Mellody Dance, DO PCP - General Family Medicine  11/26/16   Defrancesco, Alanda Slim, MD Consulting Physician Obstetrics and Gynecology  11/12/16   Joylene Igo, CNM Midwife Obstetrics and Gynecology  11/12/16   Danella Sensing, MD Consulting Physician Dermatology  11/12/16   Richmond Campbell, MD Consulting Physician Gastroenterology  11/26/16   Carol Ada, MD Consulting Physician Gastroenterology  11/26/16   Keene Breath., MD  Ophthalmology  11/26/16   Joylene Igo, CNM Midwife Obstetrics and Gynecology  11/26/16    Comment: Prescribes her Seroquel  Doree Fudge, PhD Consulting Physician Psychology  07/21/19    Comment: counseling     Patient Active Problem List   Diagnosis Date Noted  . Mood disorder ([place])- mixed anxiety and depression 01/20/2018  . Obesity, Class I, BMI 30-34.9 06/10/2017  . Primary insomnia 11/12/2016  . Adjustment disorder with mixed anxiety  and depressed mood 11/12/2016  . Elevated cholesterol with elevated triglycerides 09/28/2016  . Hypertension associated with diabetes ([place]) 01/31/2016  . Generalized anxiety  disorder 01/31/2016  . Postmenopausal- 30 yrs ago TAH "yrs ago" 11/12/2016  . Hormone imbalance- txed by gyn 11/12/2016  . Mixed hyperlipidemia 11/12/2016  . Hypokalemia 02/01/2016  . Vitamin D deficiency 09/28/2016  . Status post abdominal hysterectomy 12/23/2014  . Microalbuminuria due to type 2 diabetes mellitus ([place]) 06/17/2019  . Mixed diabetic hyperlipidemia associated with type 2 diabetes mellitus ([place]) 10/23/2018  . Diabetes mellitus ([place]) 10/23/2018  . Secondary hyperhidrosis- not focal in nature but primarily from waist up is the worst 10/23/2018  . Acute stress reaction 08/20/2018  . Excess, secretion, sweat 08/20/2018  . Low serum progesterone 05/30/2018  . Panic attack 01/20/2018  . Excessive sweating- likely due to hormonal 11/11/2017  . Inactivity 11/11/2017  . Elevated serum creatinine 09/04/2017  . Elevated ALT measurement 09/04/2017  . Acute biliary pancreatitis 01/31/2016  . Overweight 12/23/2014  . Surgical menopause 12/23/2014  . History of cervical dysplasia 12/23/2014  . SUI (stress urinary incontinence, female) 12/23/2014    Past Medical history, Surgical history, Family history, Social history, Allergies and Medications have been entered into the medical record, reviewed and changed as needed.    Current Meds  Medication Sig  . Ascorbic Acid (VITAMIN C) 1000 MG tablet Take 1,000 mg by mouth daily.  Marland Kitchen atorvastatin (LIPITOR) 10 MG tablet TAKE 1 TABLET (10 MG TOTAL) BY MOUTH NIGHTLY.  . blood glucose meter kit and supplies Dispense based on patient and insurance preference. Use to check fasting blood sugar once daily.  . Calcium Carbonate (CALTRATE 600 PO) Take 1 tablet by mouth daily.  Marland Kitchen losartan (COZAAR) 25 MG tablet 1 po qd  . metFORMIN (GLUCOPHAGE) 1000 MG tablet Take 1 tab q 12 hrs with meals  . Multiple Vitamins-Minerals (CENTRUM SILVER PO) Take 1 tablet by mouth daily.  Marland Kitchen omega-3 acid ethyl esters (LOVAZA) 1 g capsule Take 2 capsules (2 g total) by  mouth 2 (two) times daily.  . potassium chloride (KLOR-CON) 10 MEQ tablet TAKE 1 TABLET BY MOUTH DAILY.  . QC LO-DOSE ASPIRIN 81 MG EC tablet TAKE 1 TABLET (81 MG TOTAL) BY MOUTH DAILY.  Marland Kitchen triamterene-hydrochlorothiazide (MAXZIDE-25) 37.5-25 MG tablet TAKE 1 TABLET BY MOUTH DAILY.  . valACYclovir (VALTREX) 500 MG tablet TAKE 1 TABLET BY MOUTH DAILY. **NEED OFFICE VISIT FOR FURTHER REFILLS**  . Vitamin D, Ergocalciferol, (DRISDOL) 1.25 MG (50000 UNIT) CAPS capsule Take 1 capsule (50,000 Units total) by mouth every 7 (seven) days.  Marland Kitchen zolpidem (AMBIEN CR) 12.5 MG CR tablet Take 1 tablet (12.5 mg total) by mouth at bedtime as needed. for sleep  . [DISCONTINUED] metFORMIN (GLUCOPHAGE) 500 MG tablet Start with 1/2 tab bid for 1-2 wks, then inc as tol to 1 po bid    Allergies:  Allergies  Allergen Reactions  . Prozac [Fluoxetine Hcl] Other (See Comments)    sweating     Review of Systems:  A fourteen system review of systems was performed and found to be positive as per HPI.   Objective:   Blood pressure 112/71, pulse 97, temperature 98.2 F (36.8 C), temperature source Oral, resp. rate 12, height 5' 7"  (1.702 m), weight 193 lb 12.8 oz (87.9 kg), SpO2 96 %. Body mass index is 30.35 kg/m. General:  Well Developed, well nourished, appropriate for stated age.  Neuro:  Alert and oriented,  extra-ocular muscles intact  HEENT:  Normocephalic, atraumatic, neck supple, no carotid bruits appreciated  Skin:  no gross rash, warm, pink. Cardiac:  RRR, S1 S2 Respiratory:  ECTA B/L and A/P, Not using accessory muscles, speaking in full sentences- unlabored. Vascular:  Ext warm, no cyanosis apprec.; cap RF less 2 sec. Psych:  No HI/SI, judgement and insight good, Euthymic mood. Full Affect.

## 2019-07-21 NOTE — Telephone Encounter (Signed)
Per patient when she was in office this morning you advised her to call her pharmacy and see if you had ever prescribed Trazodone. Patient said she did this and that you have not prescribed this medication before. She said if you want her to take it please send it into [place] [facility]. AS, CMA

## 2019-07-22 NOTE — Telephone Encounter (Signed)
Patient advised of the below and verbalized understanding. AS, CMA

## 2019-07-28 ENCOUNTER — Other Ambulatory Visit: Payer: Self-pay | Admitting: Family Medicine

## 2019-07-28 ENCOUNTER — Ambulatory Visit (INDEPENDENT_AMBULATORY_CARE_PROVIDER_SITE_OTHER): Payer: Medicare Other | Admitting: Psychology

## 2019-07-28 DIAGNOSIS — E782 Mixed hyperlipidemia: Secondary | ICD-10-CM

## 2019-07-28 DIAGNOSIS — F331 Major depressive disorder, recurrent, moderate: Secondary | ICD-10-CM | POA: Diagnosis not present

## 2019-08-05 ENCOUNTER — Other Ambulatory Visit: Payer: Self-pay | Admitting: Family Medicine

## 2019-08-05 DIAGNOSIS — I10 Essential (primary) hypertension: Secondary | ICD-10-CM

## 2019-08-07 ENCOUNTER — Ambulatory Visit: Payer: Medicare Other | Attending: Internal Medicine

## 2019-08-07 DIAGNOSIS — Z23 Encounter for immunization: Secondary | ICD-10-CM

## 2019-08-07 NOTE — Progress Notes (Signed)
   Covid-19 Vaccination Clinic  Name:  Mercedes Webb    MRN: [id number] DOB: 11-16-1955  08/07/2019  [name] was observed post Covid-19 immunization for 15 minutes without incident. She was provided with Vaccine Information Sheet and instruction to access the V-Safe system.   [name] was instructed to call 911 with any severe reactions post vaccine: Marland Kitchen Difficulty breathing  . Swelling of face and throat  . A fast heartbeat  . A bad rash all over body  . Dizziness and weakness   Immunizations Administered    Name Date Dose VIS Date Route   Pfizer COVID-19 Vaccine 08/07/2019  8:48 AM 0.3 mL 04/10/2019 Intramuscular   Manufacturer: [place]   Lot: [id number]   [place]: [id number]

## 2019-08-11 ENCOUNTER — Encounter: Payer: Medicare Other | Attending: Family Medicine | Admitting: Dietician

## 2019-08-11 ENCOUNTER — Encounter: Payer: Self-pay | Admitting: Dietician

## 2019-08-11 ENCOUNTER — Other Ambulatory Visit: Payer: Self-pay

## 2019-08-11 DIAGNOSIS — E119 Type 2 diabetes mellitus without complications: Secondary | ICD-10-CM | POA: Insufficient documentation

## 2019-08-11 DIAGNOSIS — E1169 Type 2 diabetes mellitus with other specified complication: Secondary | ICD-10-CM

## 2019-08-11 NOTE — Progress Notes (Signed)
Patient was seen on 08/11/2019 for the first of a series of three diabetes self-management courses at [facility].  Patient Education Plan per assessed needs and concerns is to attend three course education program for Diabetes Self Management Education.  The following learning objectives were met by the patient during this class:  Describe diabetes, types of diabetes and pathophysiology  State some common risk factors for diabetes  Defines the role of glucose and insulin  Describe the relationship between diabetes and cardiovascular and other risks  State the members of the Healthcare Team  States the rationale for glucose monitoring and when to test  State their individual Target Range  State the importance of logging glucose readings and how to interpret the readings  Identifies A1C target  Explain the correlation between A1c and eAG values  State symptoms and treatment of high blood glucose and low blood glucose  Explain proper technique for glucose testing and identify proper sharps disposal  Handouts given during class include:  How to Thrive:  A Guide for Your Journey with Diabetes by the ADA  Meal Plan Card and carbohydrate content list  Dietary intake form  Low Sodium Flavoring Tips  Types of Fats  Dining Out  Label reading  Snack list  Planning a balanced meal  The diabetes portion plate  Diabetes Resources  A1c to eAG Conversion Chart  Blood Glucose Log  Diabetes Recommended Care Schedule  Support Group  Diabetes Success Plan  Core Class Satisfaction Survey   Follow-Up Plan:  Attend core 2   

## 2019-08-12 ENCOUNTER — Ambulatory Visit (INDEPENDENT_AMBULATORY_CARE_PROVIDER_SITE_OTHER): Payer: Medicare Other | Admitting: Psychology

## 2019-08-12 DIAGNOSIS — F331 Major depressive disorder, recurrent, moderate: Secondary | ICD-10-CM

## 2019-08-18 ENCOUNTER — Encounter: Payer: Medicare Other | Admitting: Dietician

## 2019-08-18 ENCOUNTER — Other Ambulatory Visit: Payer: Self-pay

## 2019-08-18 ENCOUNTER — Encounter: Payer: Self-pay | Admitting: Dietician

## 2019-08-18 DIAGNOSIS — E119 Type 2 diabetes mellitus without complications: Secondary | ICD-10-CM

## 2019-08-18 DIAGNOSIS — E1169 Type 2 diabetes mellitus with other specified complication: Secondary | ICD-10-CM | POA: Diagnosis not present

## 2019-08-18 NOTE — Progress Notes (Signed)
Patient was seen on 08/18/2019 for the second of a series of three diabetes self-management courses at the [facility]. The following learning objectives were met by the patient during this class:   Describe the role of different macronutrients on glucose  Explain how carbohydrates affect blood glucose  State what foods contain the most carbohydrates  Demonstrate carbohydrate counting  Demonstrate how to read Nutrition Facts food label  Describe effects of various fats on heart health  Describe the importance of good nutrition for health and healthy eating strategies  Describe techniques for managing your shopping, cooking and meal planning  List strategies to follow meal plan when dining out  Describe the effects of alcohol on glucose and how to use it safely  Goals:  Follow Diabetes Meal Plan as instructed  Aim to spread carbs evenly throughout the day  Aim for 3 meals per day and snacks as needed Include lean protein foods to meals/snacks  Monitor glucose levels as instructed by your doctor   Follow-Up Plan:  Attend Core 3  Work towards following your personal food plan.   

## 2019-08-25 ENCOUNTER — Other Ambulatory Visit: Payer: Self-pay

## 2019-08-25 ENCOUNTER — Encounter: Payer: Self-pay | Admitting: Dietician

## 2019-08-25 ENCOUNTER — Encounter: Payer: Medicare Other | Admitting: Dietician

## 2019-08-25 DIAGNOSIS — E1169 Type 2 diabetes mellitus with other specified complication: Secondary | ICD-10-CM

## 2019-08-25 NOTE — Progress Notes (Signed)
Patient was seen on 08/25/2019 for the third of a series of three diabetes self-management courses at [facility].   [place] the amount of activity recommended for healthy living . Describe activities suitable for individual needs . Identify ways to regularly incorporate activity into daily life . Identify barriers to activity and ways to over come these barriers  Identify diabetes medications being personally used and their primary action for lowering glucose and possible side effects . Describe role of stress on blood glucose and develop strategies to address psychosocial issues . Identify diabetes complications and ways to prevent them  Explain how to manage diabetes during illness . Evaluate success in meeting personal goal . Establish 2-3 goals that they will plan to diligently work on  Goals:   I will count my carb choices at most meals and snacks  Eat more fresh vegetables  I will be active 30 minutes or more 5 times a week  I will take my diabetes medications as scheduled  I will eat less unhealthy fats by eating less bacon  I will test my glucose at least 1 times a day, 7 days a week  I will look at patterns in my record book at least all days a month  To help manage stress I will  Be more physically active at least 5 times a week  Your patient has identified these potential barriers to change:  None noted  Your patient has identified their diabetes self-care support plan as  On-line Resources  [facility] Support Group    Plan:  Attend Support Group as desired

## 2019-08-26 ENCOUNTER — Ambulatory Visit: Payer: Medicare Other | Admitting: Psychology

## 2019-08-27 ENCOUNTER — Ambulatory Visit (INDEPENDENT_AMBULATORY_CARE_PROVIDER_SITE_OTHER): Payer: Medicare Other | Admitting: Family Medicine

## 2019-08-27 ENCOUNTER — Encounter: Payer: Self-pay | Admitting: Family Medicine

## 2019-08-27 ENCOUNTER — Other Ambulatory Visit: Payer: Self-pay

## 2019-08-27 VITALS — BP 139/74 | HR 97 | Temp 97.7°F | Ht 67.0 in | Wt 192.5 lb

## 2019-08-27 DIAGNOSIS — E1169 Type 2 diabetes mellitus with other specified complication: Secondary | ICD-10-CM | POA: Diagnosis not present

## 2019-08-27 DIAGNOSIS — I10 Essential (primary) hypertension: Secondary | ICD-10-CM

## 2019-08-27 DIAGNOSIS — R809 Proteinuria, unspecified: Secondary | ICD-10-CM

## 2019-08-27 DIAGNOSIS — E1159 Type 2 diabetes mellitus with other circulatory complications: Secondary | ICD-10-CM

## 2019-08-27 DIAGNOSIS — E1129 Type 2 diabetes mellitus with other diabetic kidney complication: Secondary | ICD-10-CM

## 2019-08-27 DIAGNOSIS — E782 Mixed hyperlipidemia: Secondary | ICD-10-CM

## 2019-08-27 DIAGNOSIS — I152 Hypertension secondary to endocrine disorders: Secondary | ICD-10-CM

## 2019-08-27 DIAGNOSIS — F39 Unspecified mood [affective] disorder: Secondary | ICD-10-CM | POA: Diagnosis not present

## 2019-08-27 DIAGNOSIS — F5101 Primary insomnia: Secondary | ICD-10-CM

## 2019-08-27 DIAGNOSIS — F411 Generalized anxiety disorder: Secondary | ICD-10-CM

## 2019-08-27 NOTE — Patient Instructions (Addendum)
 -  Also we need to transition your brain into thinking more positively.  These tasks below are some things I want you to do every day 1)  write 3 new things that you are grateful for every day for 21 days  2)  exercise daily- walk for 15 minutes twice a day every day 3)  you are going to journal every day about one positive experience that you had 4)  meditate every day.  You can go on YouTube and look for 15-minute relaxation meditation or what ever.  But we need to make sure that you are in the moment and relaxing and deep breathing every day 5)  Write 1 positive email every day to praise someone in your life     - If you have insomnia or difficulty sleeping, this information is for you:  - Avoid caffeinated beverages after lunch,  no alcoholic beverages,  no eating within 2-3 hours of lying down,  avoid exposure to blue light before bed,  avoid daytime naps, and  needs to maintain a regular sleep schedule- go to sleep and wake up around the same time every night.   - Resolve concerns or worries before entering bedroom:  Discussed relaxation techniques with patient and to keep a journal to write down fears\ worries.  I suggested seeing a counselor for CBT.   - Recommend patient meditate or do deep breathing exercises to help relax.   Incorporate the use of white noise machines or listen to "sleep meditation music", or recordings of guided meditations for sleep from YouTube which are free, such as  "guided meditation for detachment from over thinking"  by Mayford Knife.

## 2019-08-27 NOTE — Progress Notes (Signed)
Impression and Recommendations:    1. Type 2 diabetes mellitus with other specified complication, without long-term current use of insulin ([place])   2. Hypertension associated with diabetes ([place])   3. Mixed diabetic hyperlipidemia associated with type 2 diabetes mellitus ([place])   4. Microalbuminuria due to type 2 diabetes mellitus ([place])   5. Mood disorder ([place])   6. GAD (generalized anxiety disorder)   7. Primary insomnia      Type 2 diabetes mellitus w/ other specified complication, without long-term current use of insulin - Last OV, increased patient's dose of metformin. - Last OV, referral placed to diabetic nutrition.  - Per patient, has been taking metformin as prescribed and visiting nutritionist as established.  - A1c 1.5 months ago was 7.5, up from 6.7 ten months ago. - Will re-check A1c after three consistent months of treatment on metformin. - Return end of June for A1c and chronic f/up if needed.  - Pt will continue current treatment regimen.  See med list.  - Counseled patient on pathophysiology of disease and discussed various treatment options, which always includes dietary and lifestyle modification as first line.    - Importance of low carb, heart-healthy diet discussed with patient in addition to regular aerobic exercise of 94mn 5d/week or more.   - Check FBS and 2 hours after the biggest meal of your day.  Keep log and bring in next OV for my review.     - Also told patient if you ever feel poorly, please check your blood pressure and blood sugar, as one or the other could be the cause of your symptoms.  - Pt reminded about need for yearly eye and foot exams.  Told patient to make appt.for diabetic eye exam, CMAs here will do foot exams  - We will continue to monitor.   Microalbuminuria due to type 2 DM - Last checked at 30-300.  - To help preserve kidney health, advised patient to maintain control over her blood sugar and blood pressure, avoid  nephrotoxic substances, continue to hydrate adequately, and engage in regular physical activity, especially a formal exercise routine.  - Will continue to monitor and re-check as discussed.   Hypertension associated with DM - Blood pressure currently is stable, at goal. - Patient will continue current treatment regimen.  See med list.  - Counseled patient on pathophysiology of disease and discussed various treatment options, which always includes dietary and lifestyle modification as first line.   - Lifestyle changes such as dash and heart healthy diets and engaging in a regular exercise program discussed extensively with patient.   - Ambulatory blood pressure monitoring encouraged at least 3 times weekly.  Keep log and bring in every office visit.  Reminded patient that if she ever feels poorly in any way, to check her blood pressure and pulse.  - We will continue to monitor.   Mixed diabetic hyperlipidemia associated with type 2 DM - Liver enzymes stable last check: AST = 43, elevated, but down from 58 ten months ago. ALT = 59, elevated, but down from 70 ten months ago.  - Cholesterol levels at goal last check. - Pt will continue current treatment regimen.  See med list.  - Dietary changes such as low saturated & trans fat diets for hyperlipidemia and low carb diets for hypertriglyceridemia discussed with patient.    - Encouraged patient to follow AHA guidelines for regular exercise and also engage in weight loss if BMI above 25.   -  We will continue to monitor and re-check as discussed.   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 - Per patient, working intensively with her care team.  - Reviewed the "spokes of the wheel" of mood and health management.  In addition to behavioral therapy and prescription intervention, stressed the importance of ongoing prudent habits, including regular exercise, appropriate sleep hygiene, healthful  dietary habits, and prayer/meditation to relax.  - Will continue to monitor alongside specialists.   Primary Insomnia - Patient uses trazodone to aid with sleep at night. - Per patient, symptoms suboptimally controlled. - Patient declines increase in trazodone at this time.  - Continue management as established.  See med list. - Advised patient to utilize a meditation app to help calm down at bedtime.  - Reviewed sleep hygiene extensively with patient.  - Will continue to monitor alongside behavioral health specialists.   Health Counseling & Preventative Maintenance - Advised patient to continue working toward exercising to improve overall mental, physical, and emotional health.    - Encouraged patient to engage in daily physical activity as tolerated, especially a formal exercise routine.  Recommended that the patient eventually strive for at least 150 minutes of moderate cardiovascular activity per week according to guidelines established by the [facility].   - Healthy dietary habits encouraged, including low-carb, and high amounts of lean protein in diet.   - Patient should also consume adequate amounts of water.  - Health counseling performed.  All questions answered.   Please see AVS handed out to patient at the end of our visit for further patient instructions/ counseling done pertaining to today's office visit.   Return for re-check A1c in June of 2021, three months after starting metformin,-needs cmp+ Mag.     Note:  This note was prepared with assistance of Systems analyst. Occasional wrong-word or sound-a-like substitutions may have occurred due to the inherent limitations of voice recognition software.   The [place] was signed into law in 2016 which includes the topic of electronic health records.  This provides immediate access to information in MyChart.  This includes consultation notes, operative notes, office notes, lab results and  pathology reports.  If you have any questions about what you read please let us know at your next visit or call us at the office.  We are right here with you.  This case required medical decision making of at least moderate complexity.  This document serves as a record of services personally performed by Mellody Dance, DO. It was created on her behalf by [name], a trained medical scribe. The creation of this record is based on the scribe's personal observations and the provider's statements to them.    The above documentation from [name], medical scribe, has been reviewed by Marjory Sneddon, D.O. -----------------------------------------------------------------------------------------------------------------------------------------------------------------------------------------------------------------------------------    Subjective:     Mercedes Webb, am serving as scribe for Dr.Gurshan Settlemire.   HPI: Mercedes Webb is a 64 y.o. female who presents to [place] at [facility] today for issues as discussed below.  - History of Pancreatitis Patient knows to advise all of her future providers about her history of pancreatitis, especially prior to prescribing additional diabetes medications.  - Environmental & Seasonal Allergies States "I have had more trouble with pollen this year than ever."  - [place] a psychologist in [place], and the [place] psychologist recommended that the patient go to see Dr. Geanie Kenning  of psychiatry.  She is excited about having a test done to evaluate which medication is best for her.  Notes "there's so many things they can check you for."  She's hoping to get her sleeping and depression under control.  Was told by her therapist that "I think you're more depression-driven" as opposed to anxiety.  Thinks that she will feel better once she properly addresses the "funk I've been in."   Was doing journalling, but notes "kind of backed off from it."  Believes what she was journalling was "not the right direction."  She needed to turn her mindset away from the bad things that have happened, or the things that haven't happened, but the things she is grateful for instead.  - Insomnia She continues taking Trazodone 100 mg q hs for aid with sleep.  Reports that she still wrestles with going to sleep.  Notes "when I get to sleep, I don't sleep a long period, and I wake up."  Hoping to find a depression medicine that works better for her to "get my calm back."  Says when she doesn't sleep, she has no energy, feels tired, etc.  She does not desire to change her dosage of Trazodone at this time.  - Exercise Habits Pushing herself to exercise every day.  Says "it helps everything."   HPI:   Diabetes Mellitus:  Home glucose readings:  Between 130-140.  Feels "a lot of it is what I eat the night before."  She is trying to learn how to do better in general, snacking better with pieces of celery or carrots after dinner.  Sometimes she wants "that crunch."  Says she's learning that she has to eat all of her food out of her house.   - Patient reports good compliance with therapy plan: medication and/or lifestyle modification.  Taking 1000 metformin BID.  Says she's been "fluctuating still some" in terms of blood sugar, but going to nutrition classes.  Feels the classes have been helpful.  Says "so much came up in the class that wasn't even in what they were teaching," what people had tried, what worked for them, etc.  Says everyone in the class was a newly diagnosed diabetic.  "It was really helpful, and I'm still absorbing stuff."  She has access to a support group of fellow diabetics, etc., from the class now as well.  Says she's putting everything she's learned from her classes into her diet.  - Her denies acute concerns or problems related to treatment plan  - She denies new  concerns.  Denies polyuria/polydipsia, hypo/ hyperglycemia symptoms.  Denies new onset of: chest pain, exercise intolerance, shortness of breath, dizziness, visual changes, headache, lower extremity swelling or claudication.   Last A1C in the office was:  Lab Results  Component Value Date   HGBA1C 7.5 (A) 06/04/2019   HGBA1C 6.7 (H) 10/20/2018   HGBA1C 5.6 08/29/2017   Lab Results  Component Value Date   MICROALBUR 80 06/04/2019   [place] 68 06/04/2019   CREATININE 0.96 06/04/2019   HPI:  Hyperlipidemia:  64 y.o. female here for cholesterol follow-up.   - Patient reports good compliance with treatment plan of:  medication and/ or lifestyle management.    - Patient denies any acute concerns or problems with management plan   - She denies new onset of: myalgias, arthralgias, increased fatigue more than normal, chest pains, exercise intolerance, shortness of breath, dizziness, visual changes, headache, lower extremity swelling or claudication.   Most recent  cholesterol panel was:  Lab Results  Component Value Date   CHOL 149 06/04/2019   HDL 45 06/04/2019   LDLCALC 68 06/04/2019   LDLDIRECT 76 06/04/2019   TRIG 223 (H) 06/04/2019   CHOLHDL 3.3 06/04/2019   Hepatic Function Latest Ref Rng & Units 06/04/2019 10/20/2018 04/24/2018  Total Protein 6.0 - 8.5 g/dL 7.9 7.3 7.3  Albumin 3.8 - 4.8 g/dL 4.9(H) 4.2 4.5  AST 0 - 40 IU/L 43(H) 58(H) 54(H)  ALT 0 - 32 IU/L 59(H) 70(H) 67(H)  Alk Phosphatase 39 - 117 IU/L 130(H) 126(H) 106  Total Bilirubin 0.0 - 1.2 mg/dL 0.5 0.4 0.3  Bilirubin, Direct 0.1 - 0.5 mg/dL - - -    HPI:  Hypertension:  -  Her blood pressure at home has been running: 130-135/80, "within a couple of numbers here and there."  She has learned that she needs to sit down for a moment before checking her BP.  - Patient reports good compliance with medication and/or lifestyle modification  - Her denies acute concerns or problems related to treatment plan  - She  denies new onset of: chest pain, exercise intolerance, shortness of breath, dizziness, visual changes, headache, lower extremity swelling or claudication.   Last 3 blood pressure readings in our office are as follows: BP Readings from Last 3 Encounters:  08/27/19 139/74  07/21/19 112/71  06/17/19 136/78   Filed Weights   08/27/19 0816  Weight: 192 lb 8 oz (87.3 kg)      Wt Readings from Last 3 Encounters:  08/27/19 192 lb 8 oz (87.3 kg)  08/11/19 194 lb (88 kg)  07/21/19 193 lb 12.8 oz (87.9 kg)   Pulse Readings from Last 3 Encounters:  08/27/19 97  07/21/19 97  06/04/19 (!) 101   BMI Readings from Last 3 Encounters:  08/27/19 30.15 kg/m  08/11/19 30.38 kg/m  07/21/19 30.35 kg/m   Depression screen [facility] 2/9 08/27/2019 08/11/2019 07/21/2019  Decreased Interest 1 0 2  Down, Depressed, Hopeless 1 1 2   PHQ - 2 Score 2 1 4   Altered sleeping 3 - 2  Tired, decreased energy 2 - 2  Change in appetite 2 - 2  Feeling bad or failure about yourself  0 - 1  Trouble concentrating 1 - 1  Moving slowly or fidgety/restless 0 - 0  Suicidal thoughts 0 - 0  PHQ-9 Score 10 - 12  Difficult doing work/chores Not difficult at all - Somewhat difficult  Some recent data might be hidden   GAD 7 : Generalized Anxiety Score 08/27/2019 07/21/2019 04/28/2019 10/23/2018  Nervous, Anxious, on Edge 1 0 1 2  Control/stop worrying 0 1 0 2  Worry too much - different things 1 0 1 2  Trouble relaxing 1 1 1 1   Restless 0 1 1 0  Easily annoyed or irritable 0 1 1 0  Afraid - awful might happen 0 0 0 0  Total GAD 7 Score 3 4 5 7   Anxiety Difficulty Not difficult at all Somewhat difficult Not difficult at all Very difficult      Patient Care Team    Relationship Specialty Notifications Start End  Mellody Dance, DO PCP - General Family Medicine  11/26/16   Defrancesco, Alanda Slim, MD Consulting Physician Obstetrics and Gynecology  11/12/16   Joylene Igo, CNM Midwife Obstetrics and Gynecology   11/12/16   Danella Sensing, MD Consulting Physician Dermatology  11/12/16   Richmond Campbell, MD Consulting Physician Gastroenterology  11/26/16  Carol Ada, MD Consulting Physician Gastroenterology  11/26/16   Keene Breath., MD  Ophthalmology  11/26/16   Joylene Igo, CNM Midwife Obstetrics and Gynecology  11/26/16    Comment: Prescribes her Seroquel  Doree Fudge, PhD Consulting Physician Psychology  07/21/19    Comment: counseling     Patient Active Problem List   Diagnosis Date Noted  . Mood disorder ([place])- mixed anxiety and depression 01/20/2018  . Obesity, Class I, BMI 30-34.9 06/10/2017  . Primary insomnia 11/12/2016  . Adjustment disorder with mixed anxiety and depressed mood 11/12/2016  . Elevated cholesterol with elevated triglycerides 09/28/2016  . Hypertension associated with diabetes ([place]) 01/31/2016  . Generalized anxiety disorder 01/31/2016  . Postmenopausal- 30 yrs ago TAH "yrs ago" 11/12/2016  . Hormone imbalance- txed by gyn 11/12/2016  . Mixed hyperlipidemia 11/12/2016  . Hypokalemia 02/01/2016  . Vitamin D deficiency 09/28/2016  . Status post abdominal hysterectomy 12/23/2014  . Microalbuminuria due to type 2 diabetes mellitus ([place]) 06/17/2019  . Mixed diabetic hyperlipidemia associated with type 2 diabetes mellitus ([place]) 10/23/2018  . Diabetes mellitus ([place]) 10/23/2018  . Secondary hyperhidrosis- not focal in nature but primarily from waist up is the worst 10/23/2018  . Acute stress reaction 08/20/2018  . Excess, secretion, sweat 08/20/2018  . Low serum progesterone 05/30/2018  . Panic attack 01/20/2018  . Excessive sweating- likely due to hormonal 11/11/2017  . Inactivity 11/11/2017  . Elevated serum creatinine 09/04/2017  . Elevated ALT measurement 09/04/2017  . Acute biliary pancreatitis 01/31/2016  . Overweight 12/23/2014  . Surgical menopause 12/23/2014  . History of cervical dysplasia 12/23/2014  . SUI (stress urinary incontinence, female)  12/23/2014    Past Medical history, Surgical history, Family history, Social history, Allergies and Medications have been entered into the medical record, reviewed and changed as needed.    Current Meds  Medication Sig  . Ascorbic Acid (VITAMIN C) 1000 MG tablet Take 1,000 mg by mouth daily.  Marland Kitchen atorvastatin (LIPITOR) 10 MG tablet TAKE 1 TABLET (10 MG TOTAL) BY MOUTH NIGHTLY.  . blood glucose meter kit and supplies Dispense based on patient and insurance preference. Use to check fasting blood sugar once daily.  . Calcium Carbonate (CALTRATE 600 PO) Take 1 tablet by mouth daily.  Marland Kitchen losartan (COZAAR) 25 MG tablet 1 po qd  . metFORMIN (GLUCOPHAGE) 1000 MG tablet Take 1 tab q 12 hrs with meals  . Multiple Vitamins-Minerals (CENTRUM SILVER PO) Take 1 tablet by mouth daily.  Marland Kitchen omega-3 acid ethyl esters (LOVAZA) 1 g capsule Take 2 capsules (2 g total) by mouth 2 (two) times daily.  . potassium chloride (KLOR-CON) 10 MEQ tablet TAKE 1 TABLET BY MOUTH DAILY.  . QC LO-DOSE ASPIRIN 81 MG EC tablet TAKE 1 TABLET (81 MG TOTAL) BY MOUTH DAILY.  . traZODone (DESYREL) 100 MG tablet Take 0.5-1 tablets (50-100 mg total) by mouth at bedtime as needed for sleep.  Marland Kitchen triamterene-hydrochlorothiazide (MAXZIDE-25) 37.5-25 MG tablet TAKE 1 TABLET BY MOUTH DAILY.  . valACYclovir (VALTREX) 500 MG tablet TAKE 1 TABLET BY MOUTH DAILY.  Marland Kitchen Vitamin D, Ergocalciferol, (DRISDOL) 1.25 MG (50000 UNIT) CAPS capsule Take 1 capsule (50,000 Units total) by mouth every 7 (seven) days.    Allergies:  Allergies  Allergen Reactions  . Prozac [Fluoxetine Hcl] Other (See Comments)    sweating     Review of Systems:  A fourteen system review of systems was performed and found to be positive as per HPI.   Objective:  Blood pressure 139/74, pulse 97, temperature 97.7 F (36.5 C), temperature source Oral, height 5' 7"  (1.702 m), weight 192 lb 8 oz (87.3 kg), SpO2 94 %. Body mass index is 30.15 kg/m. General:  Well Developed,  well nourished, appropriate for stated age.  Neuro:  Alert and oriented,  extra-ocular muscles intact  HEENT:  Normocephalic, atraumatic, neck supple, no carotid bruits appreciated  Skin:  no gross rash, warm, pink. Cardiac:  RRR, S1 S2 Respiratory:  ECTA B/L and A/P, Not using accessory muscles, speaking in full sentences- unlabored. Vascular:  Ext warm, no cyanosis apprec.; cap RF less 2 sec. Psych:  No HI/SI, judgement and insight good, Euthymic mood. Full Affect.

## 2019-08-31 ENCOUNTER — Ambulatory Visit: Payer: Medicare Other | Attending: Internal Medicine

## 2019-08-31 DIAGNOSIS — Z23 Encounter for immunization: Secondary | ICD-10-CM

## 2019-08-31 NOTE — Progress Notes (Signed)
   Covid-19 Vaccination Clinic  Name:  JUSTIS ATEN    MRN: [id number] DOB: [birth year]  08/31/2019  Ms. Mcclain was observed post Covid-19 immunization for 15 minutes without incident. She was provided with Vaccine Information Sheet and instruction to access the V-Safe system.   Ms. Waldock was instructed to call 911 with any severe reactions post vaccine: Marland Kitchen Difficulty breathing  . Swelling of face and throat  . A fast heartbeat  . A bad rash all over body  . Dizziness and weakness   Immunizations Administered    Name Date Dose VIS Date Route   Pfizer COVID-19 Vaccine 08/31/2019  9:04 AM 0.3 mL 06/24/2018 Intramuscular   Manufacturer: [place]   Lot: [id number]   [place]: [id number]

## 2019-09-09 ENCOUNTER — Ambulatory Visit: Payer: Medicare Other | Admitting: Psychology

## 2019-09-16 ENCOUNTER — Other Ambulatory Visit: Payer: Self-pay | Admitting: Physician Assistant

## 2019-09-16 DIAGNOSIS — I152 Hypertension secondary to endocrine disorders: Secondary | ICD-10-CM

## 2019-09-16 DIAGNOSIS — R809 Proteinuria, unspecified: Secondary | ICD-10-CM

## 2019-09-16 DIAGNOSIS — E1159 Type 2 diabetes mellitus with other circulatory complications: Secondary | ICD-10-CM

## 2019-09-29 ENCOUNTER — Ambulatory Visit (INDEPENDENT_AMBULATORY_CARE_PROVIDER_SITE_OTHER): Payer: Medicare Other | Admitting: Psychology

## 2019-09-29 DIAGNOSIS — F331 Major depressive disorder, recurrent, moderate: Secondary | ICD-10-CM

## 2019-10-20 ENCOUNTER — Ambulatory Visit (INDEPENDENT_AMBULATORY_CARE_PROVIDER_SITE_OTHER): Payer: Medicare Other | Admitting: Psychology

## 2019-10-20 DIAGNOSIS — F331 Major depressive disorder, recurrent, moderate: Secondary | ICD-10-CM | POA: Diagnosis not present

## 2019-10-21 ENCOUNTER — Other Ambulatory Visit: Payer: Self-pay | Admitting: Family Medicine

## 2019-10-21 DIAGNOSIS — I10 Essential (primary) hypertension: Secondary | ICD-10-CM

## 2019-10-21 DIAGNOSIS — B009 Herpesviral infection, unspecified: Secondary | ICD-10-CM

## 2019-10-21 DIAGNOSIS — E876 Hypokalemia: Secondary | ICD-10-CM

## 2019-10-21 DIAGNOSIS — E559 Vitamin D deficiency, unspecified: Secondary | ICD-10-CM

## 2019-10-21 DIAGNOSIS — E782 Mixed hyperlipidemia: Secondary | ICD-10-CM

## 2019-10-22 ENCOUNTER — Other Ambulatory Visit: Payer: Self-pay | Admitting: Physician Assistant

## 2019-10-22 DIAGNOSIS — E782 Mixed hyperlipidemia: Secondary | ICD-10-CM

## 2019-10-22 DIAGNOSIS — E1169 Type 2 diabetes mellitus with other specified complication: Secondary | ICD-10-CM

## 2019-10-22 DIAGNOSIS — E876 Hypokalemia: Secondary | ICD-10-CM

## 2019-10-22 DIAGNOSIS — I10 Essential (primary) hypertension: Secondary | ICD-10-CM

## 2019-10-22 DIAGNOSIS — E559 Vitamin D deficiency, unspecified: Secondary | ICD-10-CM

## 2019-10-22 DIAGNOSIS — B009 Herpesviral infection, unspecified: Secondary | ICD-10-CM

## 2019-10-22 NOTE — Telephone Encounter (Signed)
Patient called says pharmacy waiting on approval from [place] to fill several Rxs ( advised her they are probably sending refills request electronically to Dr. Hershal Coria basket & since she is no longer @ [facility]  , no response)   --Forwarding refill request to med asst for :   1)--- atorvastatin (LIPITOR) 10 MG tablet [[id number]]   Order Details Dose, Route, Frequency: As Directed  Dispense Quantity: 90 tablet Refills: 0       Sig: TAKE 1 TABLET (10 MG TOTAL) BY MOUTH NIGHTLY.      Start Date: 07/28/19 End Date: --   2)--- metFORMIN (GLUCOPHAGE) 1000 MG tablet [[id number]]   Order Details Dose, Route, Frequency: As Directed  Dispense Quantity: 180 tablet Refills: 0       Sig: Take 1 tab q 12 hrs with meals       3)--- potassium chloride (KLOR-CON) 10 MEQ tablet [[id number]]   Order Details Dose, Route, Frequency: As Directed  Dispense Quantity: 90 tablet Refills: 1       Sig: TAKE 1 TABLET BY MOUTH DAILY.       4)--- triamterene-hydrochlorothiazide (MAXZIDE-25) 37.5-25 MG tablet [[id number]]   Order Details Dose: 1 tablet Route: Oral Frequency: Daily  Dispense Quantity: 90 tablet Refills: 0       Sig: TAKE 1 TABLET BY MOUTH DAILY.   5)---Vitamin D, Ergocalciferol, (DRISDOL) 1.25 MG (50000 UNIT) CAPS capsule [[id number]]   Order Details Dose: 50,000 Units Route: Oral Frequency: Every 7 days  Dispense Quantity: 12 capsule Refills: 1       Sig: Take 1 capsule (50,000 Units total) by mouth every 7 (seven) days.   6)---valACYclovir (VALTREX) 500 MG tablet [[id number]]   Order Details Dose, Route, Frequency: As Directed  Dispense Quantity: 90 tablet Refills: 0       Sig: TAKE 1 TABLET BY MOUTH DAILY.    Forwarding request to med asst to send refill order to :   [facility], [address] Phone:  [phone]  Fax:  [phone]     --Dion Body

## 2019-10-23 MED ORDER — TRIAMTERENE-HCTZ 37.5-25 MG PO TABS: 1.0000 | ORAL_TABLET | Freq: Every day | ORAL | 0 refills | Status: DC

## 2019-10-23 MED ORDER — POTASSIUM CHLORIDE ER 10 MEQ PO TBCR: 10.0000 meq | EXTENDED_RELEASE_TABLET | Freq: Every day | ORAL | 0 refills | Status: DC

## 2019-10-23 MED ORDER — VITAMIN D (ERGOCALCIFEROL) 1.25 MG (50000 UNIT) PO CAPS: 50000.0000 [IU] | ORAL_CAPSULE | ORAL | 0 refills | Status: DC

## 2019-10-23 MED ORDER — VALACYCLOVIR HCL 500 MG PO TABS: ORAL_TABLET | ORAL | 0 refills | Status: DC

## 2019-10-23 MED ORDER — METFORMIN HCL 1000 MG PO TABS: ORAL_TABLET | ORAL | 0 refills | Status: DC

## 2019-10-23 MED ORDER — ATORVASTATIN CALCIUM 10 MG PO TABS: ORAL_TABLET | ORAL | 0 refills | Status: DC

## 2019-11-13 ENCOUNTER — Ambulatory Visit: Payer: Medicare Other | Admitting: Physician Assistant

## 2019-11-19 ENCOUNTER — Ambulatory Visit: Payer: Medicare Other | Admitting: Psychology

## 2019-12-08 ENCOUNTER — Ambulatory Visit: Payer: Medicare Other | Admitting: Physician Assistant

## 2019-12-24 ENCOUNTER — Other Ambulatory Visit: Payer: Self-pay | Admitting: Physician Assistant

## 2019-12-24 DIAGNOSIS — R809 Proteinuria, unspecified: Secondary | ICD-10-CM

## 2019-12-24 DIAGNOSIS — I152 Hypertension secondary to endocrine disorders: Secondary | ICD-10-CM

## 2019-12-24 DIAGNOSIS — E1129 Type 2 diabetes mellitus with other diabetic kidney complication: Secondary | ICD-10-CM

## 2019-12-31 ENCOUNTER — Encounter: Payer: Self-pay | Admitting: Physician Assistant

## 2019-12-31 ENCOUNTER — Ambulatory Visit (INDEPENDENT_AMBULATORY_CARE_PROVIDER_SITE_OTHER): Payer: Medicare Other | Admitting: Physician Assistant

## 2019-12-31 ENCOUNTER — Other Ambulatory Visit: Payer: Self-pay

## 2019-12-31 VITALS — BP 138/83 | HR 102 | Ht 67.0 in | Wt 180.7 lb

## 2019-12-31 DIAGNOSIS — E1159 Type 2 diabetes mellitus with other circulatory complications: Secondary | ICD-10-CM | POA: Diagnosis not present

## 2019-12-31 DIAGNOSIS — E1169 Type 2 diabetes mellitus with other specified complication: Secondary | ICD-10-CM | POA: Diagnosis not present

## 2019-12-31 DIAGNOSIS — E782 Mixed hyperlipidemia: Secondary | ICD-10-CM

## 2019-12-31 DIAGNOSIS — E663 Overweight: Secondary | ICD-10-CM

## 2019-12-31 DIAGNOSIS — Z23 Encounter for immunization: Secondary | ICD-10-CM | POA: Diagnosis not present

## 2019-12-31 DIAGNOSIS — I10 Essential (primary) hypertension: Secondary | ICD-10-CM

## 2019-12-31 DIAGNOSIS — F5101 Primary insomnia: Secondary | ICD-10-CM | POA: Diagnosis not present

## 2019-12-31 DIAGNOSIS — Z6828 Body mass index (BMI) 28.0-28.9, adult: Secondary | ICD-10-CM

## 2019-12-31 DIAGNOSIS — F39 Unspecified mood [affective] disorder: Secondary | ICD-10-CM

## 2019-12-31 DIAGNOSIS — I152 Hypertension secondary to endocrine disorders: Secondary | ICD-10-CM

## 2019-12-31 LAB — POCT GLYCOSYLATED HEMOGLOBIN (HGB A1C): Hemoglobin A1C: 5.5 % (ref 4.0–5.6)

## 2019-12-31 MED ORDER — METFORMIN HCL 1000 MG PO TABS: ORAL_TABLET | ORAL | 0 refills | Status: DC

## 2019-12-31 NOTE — Patient Instructions (Signed)

## 2019-12-31 NOTE — Assessment & Plan Note (Signed)
-  BP stable -Continue current medication regimen. -Follow low-sodium diet. -Checking CMP today for medication monitoring.

## 2019-12-31 NOTE — Assessment & Plan Note (Signed)
-  A1c today is 5.5, significantly improved from 7.5 -Discussed with patient decreasing Metformin from 1000 mg twice daily to once daily to reduce risk of hypoglycemia and A1c is well controlled.  Patient verbalized understanding and is agreeable. -Encouraged patient to continue with dietary changes.  -Continue ambulatory glucose monitoring. -Will continue to monitor.

## 2019-12-31 NOTE — Assessment & Plan Note (Signed)
-  Last lipid panel triglycerides elevated -Continue current medication regimen. -Follow a heart healthy diet. -Rechecking lipid panel hepatic function today.

## 2019-12-31 NOTE — Progress Notes (Signed)
Established Patient Office Visit  Subjective:  Patient ID: Mercedes Webb, female    DOB: Apr 13, 1956  Age: 64 y.o. MRN: [id number]  CC:  Chief Complaint  Patient presents with  . Diabetes  . Hypertension    HPI Mercedes Webb presents for follow-up on diabetes mellitus and hypertension.  Diabetes: Pt denies increased urination or thirst. Pt reports medication compliance. No hypoglycemic events. Checking glucose at home. FBS range 120-130. She has made significant dietary changes and reduced carbohydrates and glucose.  HTN: Pt denies chest pain, palpitations, dizziness or leg swelling. Taking medication as directed without side effects. Checks BP at home and readings range in 130s/80s.   HLD: Pt taking medication as directed without issues. Denies side effects including myalgias and RUQ pain.  Insomnia, Mood: Reports she sees psychiatry in [facility] and they have been working with her on a regimen. She is currently on Effexor and Ambien. She is no longer taking trazodone.  Past Medical History:  Diagnosis Date  . Anxiety   . Cancer ([place]) 2016   Skin CA squamous cell on bilateral feet removed  . Cervical dysplasia    h/o  . Colon polyps    polyps on first scope ~ 2006, no recurrent polyps ~ 2001 and in 2016.   . Diabetes mellitus without complication ([place])   . Endometriosis   . Family history of adverse reaction to anesthesia    Mother had difficulty waking & nausea  . Headache   . Hypertension   . Insomnia   . Pancreatitis 01/2016  . Recurrent cold sores     Past Surgical History:  Procedure Laterality Date  . ABDOMINAL HYSTERECTOMY     tah/bso- endometriosis  . CHOLECYSTECTOMY    . ERCP N/A 02/03/2016   Procedure: ENDOSCOPIC RETROGRADE CHOLANGIOPANCREATOGRAPHY (ERCP);  Surgeon: Carol Ada, MD;  Location: [facility] ENDOSCOPY;  Service: Endoscopy;  Laterality: N/A;  . KNEE ARTHROSCOPY    . tonsillectomy    . TONSILLECTOMY    . TUBAL LIGATION      Family  History  Problem Relation Age of Onset  . Diabetes Father   . Heart disease Father   . Breast cancer Sister 19  . Diabetes Brother   . Colon cancer Neg Hx   . Ovarian cancer Neg Hx     Social History   Socioeconomic History  . Marital status: Divorced    Spouse name: Not on file  . Number of children: Not on file  . Years of education: Not on file  . Highest education level: Not on file  Occupational History  . Not on file  Tobacco Use  . Smoking status: Former Smoker    Quit date: 04/30/1988    Years since quitting: 31.6  . Smokeless tobacco: Never Used  Vaping Use  . Vaping Use: Never used  Substance and Sexual Activity  . Alcohol use: Yes    Comment: occas  . Drug use: No  . Sexual activity: Yes    Birth control/protection: Surgical  Other Topics Concern  . Not on file  Social History Narrative  . Not on file   Social Determinants of Health   Financial Resource Strain:   . Difficulty of Paying Living Expenses: Not on file  Food Insecurity:   . Worried About Charity fundraiser in the Last Year: Not on file  . Ran Out of Food in the Last Year: Not on file  Transportation Needs:   . Lack of Transportation (Medical):  Not on file  . Lack of Transportation (Non-Medical): Not on file  Physical Activity:   . Days of Exercise per Week: Not on file  . Minutes of Exercise per Session: Not on file  Stress:   . Feeling of Stress : Not on file  Social Connections:   . Frequency of Communication with Friends and Family: Not on file  . Frequency of Social Gatherings with Friends and Family: Not on file  . Attends Religious Services: Not on file  . Active Member of Clubs or Organizations: Not on file  . Attends Archivist Meetings: Not on file  . Marital Status: Not on file  Intimate Partner Violence:   . Fear of Current or Ex-Partner: Not on file  . Emotionally Abused: Not on file  . Physically Abused: Not on file  . Sexually Abused: Not on file     Outpatient Medications Prior to Visit  Medication Sig Dispense Refill  . Ascorbic Acid (VITAMIN C) 1000 MG tablet Take 1,000 mg by mouth daily.    Marland Kitchen atorvastatin (LIPITOR) 10 MG tablet TAKE 1 TABLET (10 MG TOTAL) BY MOUTH NIGHTLY. 90 tablet 0  . blood glucose meter kit and supplies Dispense based on patient and insurance preference. Use to check fasting blood sugar once daily. 1 each 0  . Calcium Carbonate (CALTRATE 600 PO) Take 1 tablet by mouth daily.    Marland Kitchen losartan (COZAAR) 25 MG tablet TAKE 1 TABLET BY MOUTH DAILY 90 tablet 0  . Multiple Vitamins-Minerals (CENTRUM SILVER PO) Take 1 tablet by mouth daily.    Marland Kitchen omega-3 acid ethyl esters (LOVAZA) 1 g capsule Take 2 capsules (2 g total) by mouth 2 (two) times daily. 180 capsule 3  . potassium chloride (KLOR-CON) 10 MEQ tablet Take 1 tablet (10 mEq total) by mouth daily. 90 tablet 0  . QC LO-DOSE ASPIRIN 81 MG EC tablet TAKE 1 TABLET (81 MG TOTAL) BY MOUTH DAILY. 90 tablet 3  . triamterene-hydrochlorothiazide (MAXZIDE-25) 37.5-25 MG tablet Take 1 tablet by mouth daily. 90 tablet 0  . valACYclovir (VALTREX) 500 MG tablet TAKE 1 TABLET BY MOUTH DAILY. 90 tablet 0  . Vitamin D, Ergocalciferol, (DRISDOL) 1.25 MG (50000 UNIT) CAPS capsule Take 1 capsule (50,000 Units total) by mouth every 7 (seven) days. 12 capsule 0  . metFORMIN (GLUCOPHAGE) 1000 MG tablet Take 1 tab q 12 hrs with meals 180 tablet 0  . traZODone (DESYREL) 100 MG tablet Take 0.5-1 tablets (50-100 mg total) by mouth at bedtime as needed for sleep. (Patient not taking: Reported on 12/31/2019) 90 tablet 0   No facility-administered medications prior to visit.    Allergies  Allergen Reactions  . Prozac [Fluoxetine Hcl] Other (See Comments)    sweating    ROS Review of Systems A fourteen system review of systems was performed and found to be positive as per HPI.  Objective:    Physical Exam General:  Well Developed, well nourished, appropriate for stated age.  Neuro:  Alert  and oriented,  extra-ocular muscles intact  HEENT:  Normocephalic, atraumatic, neck supple Skin:  no gross rash, warm, pink. Cardiac:  RRR, S1 S2 Respiratory:  ECTA B/L and A/P, Not using accessory muscles, speaking in full sentences- unlabored. Vascular:  Ext warm, no cyanosis apprec.; cap RF less 2 sec. No gross edema Psych:  No HI/SI, judgement and insight good, Euthymic mood. Full Affect.  BP 138/83   Pulse (!) 102   Ht 5' 7"  (1.702 m)  Wt 180 lb 11.2 oz (82 kg)   SpO2 99%   BMI 28.30 kg/m  Wt Readings from Last 3 Encounters:  12/31/19 180 lb 11.2 oz (82 kg)  08/27/19 192 lb 8 oz (87.3 kg)  08/11/19 194 lb (88 kg)     Health Maintenance Due  Topic Date Due  . Hepatitis C Screening  Never done  . FOOT EXAM  Never done  . OPHTHALMOLOGY EXAM  Never done  . TETANUS/TDAP  Never done  . DEXA SCAN  Never done  . PNA vac Low Risk Adult (1 of 2 - PCV13) Never done  . PAP SMEAR-Modifier  09/21/2019  . MAMMOGRAM  11/22/2019  . INFLUENZA VACCINE  11/29/2019    There are no preventive care reminders to display for this patient.  Lab Results  Component Value Date   TSH 3.600 10/20/2018   Lab Results  Component Value Date   WBC 7.0 10/20/2018   HGB 13.9 10/20/2018   HCT 41.3 10/20/2018   MCV 89 10/20/2018   PLT 232 10/20/2018   Lab Results  Component Value Date   NA 137 06/04/2019   K 4.9 06/04/2019   CO2 24 06/04/2019   GLUCOSE 291 (H) 06/04/2019   BUN 14 06/04/2019   CREATININE 0.96 06/04/2019   BILITOT 0.5 06/04/2019   ALKPHOS 130 (H) 06/04/2019   AST 43 (H) 06/04/2019   ALT 59 (H) 06/04/2019   PROT 7.9 06/04/2019   ALBUMIN 4.9 (H) 06/04/2019   CALCIUM 10.2 06/04/2019   ANIONGAP 7 02/02/2016   Lab Results  Component Value Date   CHOL 149 06/04/2019   Lab Results  Component Value Date   HDL 45 06/04/2019   Lab Results  Component Value Date   LDLCALC 68 06/04/2019   Lab Results  Component Value Date   TRIG 223 (H) 06/04/2019   Lab Results   Component Value Date   CHOLHDL 3.3 06/04/2019   Lab Results  Component Value Date   HGBA1C 5.5 12/31/2019      Assessment & Plan:   Problem List Items Addressed This Visit      Cardiovascular and Mediastinum   Hypertension associated with diabetes ([place])    -BP stable -Continue current medication regimen. -Follow low-sodium diet. -Checking CMP today for medication monitoring.      Relevant Medications   metFORMIN (GLUCOPHAGE) 1000 MG tablet   Other Relevant Orders   Comp Met (CMET)   CBC w/Diff     Endocrine   Mixed diabetic hyperlipidemia associated with type 2 diabetes mellitus (HCC)    -Last lipid panel triglycerides elevated -Continue current medication regimen. -Follow a heart healthy diet. -Rechecking lipid panel hepatic function today.      Relevant Medications   metFORMIN (GLUCOPHAGE) 1000 MG tablet   Other Relevant Orders   Comp Met (CMET)   Lipid Profile   CBC w/Diff   Magnesium   Diabetes mellitus (HCC)    -A1c today is 5.5, significantly improved from 7.5 -Discussed with patient decreasing Metformin from 1000 mg twice daily to once daily to reduce risk of hypoglycemia and A1c is well controlled.  Patient verbalized understanding and is agreeable. -Encouraged patient to continue with dietary changes.  -Continue ambulatory glucose monitoring. -Will continue to monitor.      Relevant Medications   metFORMIN (GLUCOPHAGE) 1000 MG tablet   Other Relevant Orders   POCT glycosylated hemoglobin (Hb A1C) (Completed)   Comp Met (CMET)   CBC w/Diff   Magnesium  Other   Mood disorder ([place])- mixed anxiety and depression (Chronic)   Primary insomnia    Other Visit Diagnoses    Need for pneumococcal vaccination    -  Primary   Relevant Orders   Pneumococcal conjugate vaccine 13-valent IM     Mood disorder mixed anxiety and depression, Primary Insomnia: -Followed by Psychiatry and Psychology. -Continue current medication  regimen.  Overweight: -Praised patient on weight loss.  She has lost 12 pounds since last OV. -Encouraged to continue with dietary and lifestyle changes. -Recommend to use my fitness pal or lose it app as adjunct therapy.  Meds ordered this encounter  Medications  . metFORMIN (GLUCOPHAGE) 1000 MG tablet    Sig: Take 1 tab once daily with a meal    Dispense:  180 tablet    Refill:  0    Order Specific Question:   Supervising Provider    Answer:   Beatrice Lecher D [[id number]]    Follow-up: Return in about 4 months (around 05/01/2020) for HTN, HLD, DM.   Note:  This note was prepared with assistance of Dragon voice recognition software. Occasional wrong-word or sound-a-like substitutions may have occurred due to the inherent limitations of voice recognition software.  Mercedes Reid, PA-C

## 2020-01-01 LAB — LIPID PANEL
Chol/HDL Ratio: 4.6 ratio — ABNORMAL HIGH (ref 0.0–4.4)
Cholesterol, Total: 179 mg/dL (ref 100–199)
HDL: 39 mg/dL — ABNORMAL LOW (ref >39)
LDL Chol Calc (NIH): 96 mg/dL (ref 0–99)
Triglycerides: 262 mg/dL — ABNORMAL HIGH (ref 0–149)
VLDL Cholesterol Cal: 44 mg/dL — ABNORMAL HIGH (ref 5–40)

## 2020-01-01 LAB — CBC WITH DIFFERENTIAL/PLATELET
Basophils Absolute: 0.1 10*3/uL (ref 0.0–0.2)
Basos: 1 %
EOS (ABSOLUTE): 0.2 10*3/uL (ref 0.0–0.4)
Eos: 2 %
Hematocrit: 44 % (ref 34.0–46.6)
Hemoglobin: 15.2 g/dL (ref 11.1–15.9)
Immature Grans (Abs): 0 10*3/uL (ref 0.0–0.1)
Immature Granulocytes: 1 %
Lymphocytes Absolute: 1.5 10*3/uL (ref 0.7–3.1)
Lymphs: 20 %
MCH: 30.8 pg (ref 26.6–33.0)
MCHC: 34.5 g/dL (ref 31.5–35.7)
MCV: 89 fL (ref 79–97)
Monocytes Absolute: 0.4 10*3/uL (ref 0.1–0.9)
Monocytes: 6 %
Neutrophils Absolute: 5.5 10*3/uL (ref 1.4–7.0)
Neutrophils: 70 %
Platelets: 333 10*3/uL (ref 150–450)
RBC: 4.94 x10E6/uL (ref 3.77–5.28)
RDW: 13.7 % (ref 11.7–15.4)
WBC: 7.7 10*3/uL (ref 3.4–10.8)

## 2020-01-01 LAB — COMPREHENSIVE METABOLIC PANEL
ALT: 32 IU/L (ref 0–32)
AST: 28 IU/L (ref 0–40)
Albumin/Globulin Ratio: 1.5 (ref 1.2–2.2)
Albumin: 5 g/dL — ABNORMAL HIGH (ref 3.8–4.8)
Alkaline Phosphatase: 115 IU/L (ref 48–121)
BUN/Creatinine Ratio: 16 (ref 12–28)
BUN: 16 mg/dL (ref 8–27)
Bilirubin Total: 0.5 mg/dL (ref 0.0–1.2)
CO2: 25 mmol/L (ref 20–29)
Calcium: 10.5 mg/dL — ABNORMAL HIGH (ref 8.7–10.3)
Chloride: 99 mmol/L (ref 96–106)
Creatinine, Ser: 0.98 mg/dL (ref 0.57–1.00)
GFR calc Af Amer: 70 mL/min/{1.73_m2} (ref >59)
GFR calc non Af Amer: 61 mL/min/{1.73_m2} (ref >59)
Globulin, Total: 3.4 g/dL (ref 1.5–4.5)
Glucose: 119 mg/dL — ABNORMAL HIGH (ref 65–99)
Potassium: 5.2 mmol/L (ref 3.5–5.2)
Sodium: 142 mmol/L (ref 134–144)
Total Protein: 8.4 g/dL (ref 6.0–8.5)

## 2020-01-01 LAB — MAGNESIUM: Magnesium: 1.9 mg/dL (ref 1.6–2.3)

## 2020-01-14 ENCOUNTER — Other Ambulatory Visit: Payer: Self-pay | Admitting: Physician Assistant

## 2020-01-14 DIAGNOSIS — I10 Essential (primary) hypertension: Secondary | ICD-10-CM

## 2020-01-14 DIAGNOSIS — E669 Obesity, unspecified: Secondary | ICD-10-CM

## 2020-01-14 DIAGNOSIS — B009 Herpesviral infection, unspecified: Secondary | ICD-10-CM

## 2020-01-14 DIAGNOSIS — E559 Vitamin D deficiency, unspecified: Secondary | ICD-10-CM

## 2020-01-26 ENCOUNTER — Other Ambulatory Visit: Payer: Self-pay | Admitting: Physician Assistant

## 2020-01-26 DIAGNOSIS — E876 Hypokalemia: Secondary | ICD-10-CM

## 2020-01-26 DIAGNOSIS — E782 Mixed hyperlipidemia: Secondary | ICD-10-CM

## 2020-02-01 ENCOUNTER — Other Ambulatory Visit: Payer: Self-pay | Admitting: Physician Assistant

## 2020-02-01 DIAGNOSIS — E1169 Type 2 diabetes mellitus with other specified complication: Secondary | ICD-10-CM

## 2020-02-01 DIAGNOSIS — I10 Essential (primary) hypertension: Secondary | ICD-10-CM

## 2020-02-04 ENCOUNTER — Other Ambulatory Visit: Payer: Medicare Other

## 2020-02-19 ENCOUNTER — Other Ambulatory Visit: Payer: Self-pay

## 2020-02-19 ENCOUNTER — Ambulatory Visit (INDEPENDENT_AMBULATORY_CARE_PROVIDER_SITE_OTHER): Payer: Medicare Other

## 2020-02-19 DIAGNOSIS — E876 Hypokalemia: Secondary | ICD-10-CM

## 2020-02-19 DIAGNOSIS — E559 Vitamin D deficiency, unspecified: Secondary | ICD-10-CM

## 2020-02-19 DIAGNOSIS — I152 Hypertension secondary to endocrine disorders: Secondary | ICD-10-CM

## 2020-02-19 DIAGNOSIS — Z23 Encounter for immunization: Secondary | ICD-10-CM | POA: Diagnosis not present

## 2020-02-19 DIAGNOSIS — E1159 Type 2 diabetes mellitus with other circulatory complications: Secondary | ICD-10-CM

## 2020-02-19 NOTE — Progress Notes (Signed)
Pt here for influenza vaccine.  Screening questionnaire reviewed, VIS provided to patient, and any/all patient questions answered.  T. Zahraa Bhargava, CMA  

## 2020-02-20 LAB — COMPREHENSIVE METABOLIC PANEL
ALT: 37 IU/L — ABNORMAL HIGH (ref 0–32)
AST: 25 IU/L (ref 0–40)
Albumin/Globulin Ratio: 1.5 (ref 1.2–2.2)
Albumin: 4.7 g/dL (ref 3.8–4.8)
Alkaline Phosphatase: 126 IU/L — ABNORMAL HIGH (ref 44–121)
BUN/Creatinine Ratio: 15 (ref 12–28)
BUN: 14 mg/dL (ref 8–27)
Bilirubin Total: 0.5 mg/dL (ref 0.0–1.2)
CO2: 26 mmol/L (ref 20–29)
Calcium: 10.4 mg/dL — ABNORMAL HIGH (ref 8.7–10.3)
Chloride: 96 mmol/L (ref 96–106)
Creatinine, Ser: 0.93 mg/dL (ref 0.57–1.00)
GFR calc Af Amer: 75 mL/min/{1.73_m2} (ref >59)
GFR calc non Af Amer: 65 mL/min/{1.73_m2} (ref >59)
Globulin, Total: 3.2 g/dL (ref 1.5–4.5)
Glucose: 192 mg/dL — ABNORMAL HIGH (ref 65–99)
Potassium: 4.2 mmol/L (ref 3.5–5.2)
Sodium: 136 mmol/L (ref 134–144)
Total Protein: 7.9 g/dL (ref 6.0–8.5)

## 2020-02-20 LAB — VITAMIN D 25 HYDROXY (VIT D DEFICIENCY, FRACTURES): Vit D, 25-Hydroxy: 70.4 ng/mL (ref 30.0–100.0)

## 2020-03-16 ENCOUNTER — Other Ambulatory Visit: Payer: Self-pay | Admitting: Physician Assistant

## 2020-03-16 DIAGNOSIS — E1129 Type 2 diabetes mellitus with other diabetic kidney complication: Secondary | ICD-10-CM

## 2020-03-16 DIAGNOSIS — R809 Proteinuria, unspecified: Secondary | ICD-10-CM

## 2020-03-16 DIAGNOSIS — I152 Hypertension secondary to endocrine disorders: Secondary | ICD-10-CM

## 2020-03-31 ENCOUNTER — Other Ambulatory Visit: Payer: Self-pay | Admitting: Physician Assistant

## 2020-03-31 DIAGNOSIS — E559 Vitamin D deficiency, unspecified: Secondary | ICD-10-CM

## 2020-04-15 ENCOUNTER — Ambulatory Visit: Payer: Medicare Other | Attending: Internal Medicine

## 2020-04-15 DIAGNOSIS — Z23 Encounter for immunization: Secondary | ICD-10-CM

## 2020-04-15 NOTE — Progress Notes (Signed)
   Covid-19 Vaccination Clinic  Name:  Mercedes Webb    MRN: [id number] DOB: 08-07-1955  04/15/2020  [name] was observed post Covid-19 immunization for 15 minutes without incident. She was provided with Vaccine Information Sheet and instruction to access the V-Safe system.   [name] was instructed to call 911 with any severe reactions post vaccine: Marland Kitchen Difficulty breathing  . Swelling of face and throat  . A fast heartbeat  . A bad rash all over body  . Dizziness and weakness   Immunizations Administered    Name Date Dose VIS Date Route   Pfizer COVID-19 Vaccine 04/15/2020  3:29 PM 0.3 mL 02/17/2020 Intramuscular   Manufacturer: [employer]   Lot: [id number]   [place]: [id number]

## 2020-04-21 ENCOUNTER — Other Ambulatory Visit: Payer: Self-pay | Admitting: Physician Assistant

## 2020-04-21 DIAGNOSIS — B009 Herpesviral infection, unspecified: Secondary | ICD-10-CM

## 2020-04-21 DIAGNOSIS — E782 Mixed hyperlipidemia: Secondary | ICD-10-CM

## 2020-04-21 DIAGNOSIS — I10 Essential (primary) hypertension: Secondary | ICD-10-CM

## 2020-04-21 DIAGNOSIS — E876 Hypokalemia: Secondary | ICD-10-CM

## 2020-05-05 ENCOUNTER — Other Ambulatory Visit: Payer: Self-pay

## 2020-05-05 ENCOUNTER — Ambulatory Visit (INDEPENDENT_AMBULATORY_CARE_PROVIDER_SITE_OTHER): Payer: Medicare Other | Admitting: Physician Assistant

## 2020-05-05 ENCOUNTER — Encounter: Payer: Self-pay | Admitting: Physician Assistant

## 2020-05-05 VITALS — BP 111/61 | HR 95 | Ht 67.0 in | Wt 182.7 lb

## 2020-05-05 DIAGNOSIS — E559 Vitamin D deficiency, unspecified: Secondary | ICD-10-CM

## 2020-05-05 DIAGNOSIS — E1159 Type 2 diabetes mellitus with other circulatory complications: Secondary | ICD-10-CM | POA: Diagnosis not present

## 2020-05-05 DIAGNOSIS — I152 Hypertension secondary to endocrine disorders: Secondary | ICD-10-CM

## 2020-05-05 DIAGNOSIS — E782 Mixed hyperlipidemia: Secondary | ICD-10-CM

## 2020-05-05 DIAGNOSIS — E1169 Type 2 diabetes mellitus with other specified complication: Secondary | ICD-10-CM | POA: Diagnosis not present

## 2020-05-05 LAB — POCT GLUCOSE (DEVICE FOR HOME USE): Glucose Fasting, POC: 144 mg/dL — AB (ref 70–99)

## 2020-05-05 NOTE — Patient Instructions (Signed)

## 2020-05-05 NOTE — Progress Notes (Signed)
Established Patient Office Visit  Subjective:  Patient ID: Mercedes Webb, female    DOB: [birth year]  Age: 65 y.o. MRN: [id number]  CC:  Chief Complaint  Patient presents with  . Hypertension  . Hyperlipidemia  . Diabetes    HPI Mercedes Webb presents for follow up on hypertension, hyperlipidemia and diabetes mellitus.  Diabetes: Pt denies increased urination or thirst. Pt reports medication compliance. No hypoglycemic events. Checking glucose at home. FBS range 155-165. Reports noticed an increase in glucose readings so started taking metformin 1000 mg in the morning and half a tablet (500 mg) in the evenings.  HTN: Pt denies new onset chest pain, palpitations, dizziness or lower extremity swelling. Taking medication as directed without side effects. Checks BP at home and readings range <130/80. Pt tries to stay hydrated  HLD: Pt taking medication as directed without issues. Denies side effects. Tries to monitor saturated and trans fats.   Past Medical History:  Diagnosis Date  . Anxiety   . Cancer ([place]) 2016   Skin CA squamous cell on bilateral feet removed  . Cervical dysplasia    h/o  . Colon polyps    polyps on first scope ~ 2006, no recurrent polyps ~ 2001 and in 2016.   . Diabetes mellitus without complication ([place])   . Endometriosis   . Family history of adverse reaction to anesthesia    Mother had difficulty waking & nausea  . Headache   . Hypertension   . Insomnia   . Pancreatitis 01/2016  . Recurrent cold sores     Past Surgical History:  Procedure Laterality Date  . ABDOMINAL HYSTERECTOMY     tah/bso- endometriosis  . CHOLECYSTECTOMY    . ERCP N/A 02/03/2016   Procedure: ENDOSCOPIC RETROGRADE CHOLANGIOPANCREATOGRAPHY (ERCP);  Surgeon: Carol Ada, MD;  Location: [facility] ENDOSCOPY;  Service: Endoscopy;  Laterality: N/A;  . KNEE ARTHROSCOPY    . tonsillectomy    . TONSILLECTOMY    . TUBAL LIGATION      Family History  Problem Relation Age of  Onset  . Diabetes Father   . Heart disease Father   . Breast cancer Sister 42  . Diabetes Brother   . Colon cancer Neg Hx   . Ovarian cancer Neg Hx     Social History   Socioeconomic History  . Marital status: Divorced    Spouse name: Not on file  . Number of children: Not on file  . Years of education: Not on file  . Highest education level: Not on file  Occupational History  . Not on file  Tobacco Use  . Smoking status: Former Smoker    Quit date: 04/30/1988    Years since quitting: 32.0  . Smokeless tobacco: Never Used  Vaping Use  . Vaping Use: Never used  Substance and Sexual Activity  . Alcohol use: Yes    Comment: occas  . Drug use: No  . Sexual activity: Yes    Birth control/protection: Surgical  Other Topics Concern  . Not on file  Social History Narrative  . Not on file   Social Determinants of Health   Financial Resource Strain: Not on file  Food Insecurity: Not on file  Transportation Needs: Not on file  Physical Activity: Not on file  Stress: Not on file  Social Connections: Not on file  Intimate Partner Violence: Not on file    Outpatient Medications Prior to Visit  Medication Sig Dispense Refill  . AMBIEN CR 12.5 MG  CR tablet Take 12.5 mg by mouth at bedtime.    . Ascorbic Acid (VITAMIN C) 1000 MG tablet Take 1,000 mg by mouth daily.    Marland Kitchen atorvastatin (LIPITOR) 10 MG tablet TAKE 1 TABLET (10 MG TOTAL) BY MOUTH NIGHTLY. 90 tablet 0  . blood glucose meter kit and supplies Dispense based on patient and insurance preference. Use to check fasting blood sugar once daily. 1 each 0  . Calcium Carbonate (CALTRATE 600 PO) Take 1 tablet by mouth daily.    Marland Kitchen losartan (COZAAR) 25 MG tablet TAKE 1 TABLET BY MOUTH DAILY 90 tablet 0  . Multiple Vitamins-Minerals (CENTRUM SILVER PO) Take 1 tablet by mouth daily.    Marland Kitchen omega-3 acid ethyl esters (LOVAZA) 1 g capsule Take 2 capsules (2 g total) by mouth 2 (two) times daily. 180 capsule 3  . potassium chloride  (KLOR-CON) 10 MEQ tablet TAKE 1 TABLET (10 MEQ TOTAL) BY MOUTH DAILY. 90 tablet 0  . QC LO-DOSE ASPIRIN 81 MG EC tablet TAKE 1 TABLET (81 MG TOTAL) BY MOUTH DAILY. 90 tablet 3  . triamterene-hydrochlorothiazide (MAXZIDE-25) 37.5-25 MG tablet TAKE 1 TABLET BY MOUTH DAILY. 90 tablet 0  . valACYclovir (VALTREX) 500 MG tablet TAKE 1 TABLET BY MOUTH DAILY. 90 tablet 0  . venlafaxine XR (EFFEXOR-XR) 150 MG 24 hr capsule     . Vitamin D, Ergocalciferol, (DRISDOL) 1.25 MG (50000 UNIT) CAPS capsule TAKE 1 CAPSULE BY MOUTH EVERY 7 DAYS. 12 capsule 0  . metFORMIN (GLUCOPHAGE) 1000 MG tablet TAKE 1 TABLET BY MOUTH EVERY 12 HOURS WITH MEALS 180 tablet 0  . traZODone (DESYREL) 100 MG tablet Take 0.5-1 tablets (50-100 mg total) by mouth at bedtime as needed for sleep. (Patient not taking: No sig reported) 90 tablet 0   No facility-administered medications prior to visit.    Allergies  Allergen Reactions  . Prozac [Fluoxetine Hcl] Other (See Comments)    sweating    ROS Review of Systems  A fourteen system review of systems was performed and found to be positive as per HPI.    Objective:    Physical Exam General:  Well Developed, well nourished, in no acute distress Neuro:  Alert and oriented,  extra-ocular muscles intact, no focal deficits   HEENT:  Normocephalic, atraumatic, neck supple Skin:  no gross rash, warm, pink. Cardiac:  RRR, S1 S2 wnl's Respiratory:  ECTA B/L w/o wheezing, crackles, rales, Not using accessory muscles, speaking in full sentences- unlabored. Vascular:  Ext warm, no cyanosis apprec.; cap RF less 2 sec. Psych:  No HI/SI, judgement and insight good, Euthymic mood. Full Affect.   BP 111/61   Pulse 95   Ht _0  (1.702 m)   Wt 182 lb 11.2 oz (82.9 kg)   SpO2 94%   BMI 28.61 kg/m  Wt Readings from Last 3 Encounters:  05/05/20 182 lb 11.2 oz (82.9 kg)  12/31/19 180 lb 11.2 oz (82 kg)  08/27/19 192 lb 8 oz (87.3 kg)     Health Maintenance Due  Topic Date Due  .  Hepatitis C Screening  Never done  . FOOT EXAM  Never done  . OPHTHALMOLOGY EXAM  Never done  . TETANUS/TDAP  Never done  . DEXA SCAN  Never done  . MAMMOGRAM  11/22/2019    There are no preventive care reminders to display for this patient.  Lab Results  Component Value Date   TSH 3.700 05/05/2020   Lab Results  Component Value Date  WBC 6.9 05/05/2020   HGB 13.8 05/05/2020   HCT 41.2 05/05/2020   MCV 88 05/05/2020   PLT 330 05/05/2020   Lab Results  Component Value Date   NA 140 05/05/2020   K 3.9 05/05/2020   CO2 24 05/05/2020   GLUCOSE 125 (H) 05/05/2020   BUN 11 05/05/2020   CREATININE 0.96 05/05/2020   BILITOT 0.4 05/05/2020   ALKPHOS 119 05/05/2020   AST 29 05/05/2020   ALT 50 (H) 05/05/2020   PROT 8.0 05/05/2020   ALBUMIN 4.7 05/05/2020   CALCIUM 10.0 05/05/2020   ANIONGAP 7 02/02/2016   Lab Results  Component Value Date   CHOL 147 05/05/2020   Lab Results  Component Value Date   HDL 41 05/05/2020   Lab Results  Component Value Date   LDLCALC 74 05/05/2020   Lab Results  Component Value Date   TRIG 191 (H) 05/05/2020   Lab Results  Component Value Date   CHOLHDL 3.6 05/05/2020   Lab Results  Component Value Date   HGBA1C 5.9 (H) 05/05/2020      Assessment & Plan:   Problem List Items Addressed This Visit      Cardiovascular and Mediastinum   Hypertension associated with diabetes ([place])   Relevant Medications   metFORMIN (GLUCOPHAGE) 1000 MG tablet   Other Relevant Orders   CBC (Completed)   Comprehensive metabolic panel (Completed)   TSH (Completed)   Lipid panel (Completed)   Magnesium (Completed)   Hemoglobin A1c (Completed)     Endocrine   Mixed diabetic hyperlipidemia associated with type 2 diabetes mellitus (HCC)   Relevant Medications   metFORMIN (GLUCOPHAGE) 1000 MG tablet   Other Relevant Orders   CBC (Completed)   Comprehensive metabolic panel (Completed)   TSH (Completed)   Lipid panel (Completed)   Magnesium  (Completed)   Hemoglobin A1c (Completed)   Diabetes mellitus (HCC) - Primary   Relevant Medications   metFORMIN (GLUCOPHAGE) 1000 MG tablet   Other Relevant Orders   POCT Glucose (Device for Home Use) (Completed)   CBC (Completed)   Comprehensive metabolic panel (Completed)   TSH (Completed)   Lipid panel (Completed)   Magnesium (Completed)   Hemoglobin A1c (Completed)     Other   Vitamin D deficiency   Relevant Orders   Vitamin D (25 hydroxy) (Completed)     Diabetes mellitus: -Last A1c 5.5, well controlled. Will repeat A1c today and pending results will adjust metformin dose if indicated. -Continue ambulatory glucose monitoring. -Will continue to monitor. Will repeat magnesium for medication monitoring.  Hypertension associated with diabetes: -Controlled. -Continue current medication regimen.  Follow a low-sodium diet and continue good hydration. -Will repeat CMP for medication monitoring.  Mixed diabetic hyperlipidemia associated with type 2 diabetes mellitus: -Last lipid panel: Total cholesterol 147, triglycerides 191, HDL 41,  LDL 74 (goal<70). -Continue Lipitor 10 mg. -Recommend to continue heart healthy diet reduce saturated and transfats. -Will repeat lipid panel and hepatic function today.  Vitamin D deficiency: -Last vitamin D 70.4 -Will repeat vitamin D and pending results will make adjustments to current treatment plan.   Meds ordered this encounter  Medications  . metFORMIN (GLUCOPHAGE) 1000 MG tablet    Sig: Take 1 tablet by mouth in the morning with a meal. Take 0.5 tablet by mouth in the evening with a meal.    Dispense:  180 tablet    Refill:  0    Order Specific Question:   Supervising Provider    Answer:  METHENEY, CATHERINE D [[id number]]    Follow-up: Return in about 4 months (around 09/02/2020) for HTN, HLD, DM.   Note:  This note was prepared with assistance of Dragon voice recognition software. Occasional wrong-word or sound-a-like substitutions  may have occurred due to the inherent limitations of voice recognition software.  Lorrene Reid, PA-C

## 2020-05-06 LAB — COMPREHENSIVE METABOLIC PANEL
ALT: 50 IU/L — ABNORMAL HIGH (ref 0–32)
AST: 29 IU/L (ref 0–40)
Albumin/Globulin Ratio: 1.4 (ref 1.2–2.2)
Albumin: 4.7 g/dL (ref 3.8–4.8)
Alkaline Phosphatase: 119 IU/L (ref 44–121)
BUN/Creatinine Ratio: 11 — ABNORMAL LOW (ref 12–28)
BUN: 11 mg/dL (ref 8–27)
Bilirubin Total: 0.4 mg/dL (ref 0.0–1.2)
CO2: 24 mmol/L (ref 20–29)
Calcium: 10 mg/dL (ref 8.7–10.3)
Chloride: 100 mmol/L (ref 96–106)
Creatinine, Ser: 0.96 mg/dL (ref 0.57–1.00)
GFR calc Af Amer: 71 mL/min/{1.73_m2} (ref >59)
GFR calc non Af Amer: 62 mL/min/{1.73_m2} (ref >59)
Globulin, Total: 3.3 g/dL (ref 1.5–4.5)
Glucose: 125 mg/dL — ABNORMAL HIGH (ref 65–99)
Potassium: 3.9 mmol/L (ref 3.5–5.2)
Sodium: 140 mmol/L (ref 134–144)
Total Protein: 8 g/dL (ref 6.0–8.5)

## 2020-05-06 LAB — CBC
Hematocrit: 41.2 % (ref 34.0–46.6)
Hemoglobin: 13.8 g/dL (ref 11.1–15.9)
MCH: 29.5 pg (ref 26.6–33.0)
MCHC: 33.5 g/dL (ref 31.5–35.7)
MCV: 88 fL (ref 79–97)
Platelets: 330 10*3/uL (ref 150–450)
RBC: 4.68 x10E6/uL (ref 3.77–5.28)
RDW: 13.1 % (ref 11.7–15.4)
WBC: 6.9 10*3/uL (ref 3.4–10.8)

## 2020-05-06 LAB — HEMOGLOBIN A1C
Est. average glucose Bld gHb Est-mCnc: 123 mg/dL
Hgb A1c MFr Bld: 5.9 % — ABNORMAL HIGH (ref 4.8–5.6)

## 2020-05-06 LAB — LIPID PANEL
Chol/HDL Ratio: 3.6 ratio (ref 0.0–4.4)
Cholesterol, Total: 147 mg/dL (ref 100–199)
HDL: 41 mg/dL (ref >39)
LDL Chol Calc (NIH): 74 mg/dL (ref 0–99)
Triglycerides: 191 mg/dL — ABNORMAL HIGH (ref 0–149)
VLDL Cholesterol Cal: 32 mg/dL (ref 5–40)

## 2020-05-06 LAB — VITAMIN D 25 HYDROXY (VIT D DEFICIENCY, FRACTURES): Vit D, 25-Hydroxy: 101 ng/mL — ABNORMAL HIGH (ref 30.0–100.0)

## 2020-05-06 LAB — MAGNESIUM: Magnesium: 1.8 mg/dL (ref 1.6–2.3)

## 2020-05-06 LAB — TSH: TSH: 3.7 u[IU]/mL (ref 0.450–4.500)

## 2020-05-11 MED ORDER — METFORMIN HCL 1000 MG PO TABS: ORAL_TABLET | ORAL | 0 refills | Status: DC

## 2020-06-27 ENCOUNTER — Other Ambulatory Visit: Payer: Self-pay | Admitting: Physician Assistant

## 2020-06-27 DIAGNOSIS — E1169 Type 2 diabetes mellitus with other specified complication: Secondary | ICD-10-CM

## 2020-06-27 DIAGNOSIS — I152 Hypertension secondary to endocrine disorders: Secondary | ICD-10-CM

## 2020-06-27 DIAGNOSIS — E1159 Type 2 diabetes mellitus with other circulatory complications: Secondary | ICD-10-CM

## 2020-06-27 DIAGNOSIS — R809 Proteinuria, unspecified: Secondary | ICD-10-CM

## 2020-06-27 DIAGNOSIS — E1129 Type 2 diabetes mellitus with other diabetic kidney complication: Secondary | ICD-10-CM

## 2020-07-22 ENCOUNTER — Other Ambulatory Visit: Payer: Self-pay | Admitting: Physician Assistant

## 2020-07-22 DIAGNOSIS — E876 Hypokalemia: Secondary | ICD-10-CM

## 2020-07-22 DIAGNOSIS — B009 Herpesviral infection, unspecified: Secondary | ICD-10-CM

## 2020-07-29 ENCOUNTER — Other Ambulatory Visit: Payer: Self-pay | Admitting: Physician Assistant

## 2020-07-29 DIAGNOSIS — I10 Essential (primary) hypertension: Secondary | ICD-10-CM

## 2020-07-29 DIAGNOSIS — E782 Mixed hyperlipidemia: Secondary | ICD-10-CM

## 2020-08-01 ENCOUNTER — Ambulatory Visit (INDEPENDENT_AMBULATORY_CARE_PROVIDER_SITE_OTHER): Payer: Medicare Other | Admitting: Nurse Practitioner

## 2020-08-01 ENCOUNTER — Other Ambulatory Visit: Payer: Self-pay

## 2020-08-01 ENCOUNTER — Encounter: Payer: Self-pay | Admitting: Nurse Practitioner

## 2020-08-01 VITALS — BP 128/74 | HR 81 | Temp 96.1°F | Ht 67.0 in | Wt 189.6 lb

## 2020-08-01 DIAGNOSIS — R319 Hematuria, unspecified: Secondary | ICD-10-CM | POA: Insufficient documentation

## 2020-08-01 DIAGNOSIS — R300 Dysuria: Secondary | ICD-10-CM | POA: Diagnosis not present

## 2020-08-01 DIAGNOSIS — N390 Urinary tract infection, site not specified: Secondary | ICD-10-CM | POA: Insufficient documentation

## 2020-08-01 LAB — POCT URINALYSIS DIPSTICK
Bilirubin, UA: NEGATIVE
Blood, UA: NEGATIVE
Glucose, UA: NEGATIVE
Ketones, UA: NEGATIVE
Nitrite, UA: NEGATIVE
Protein, UA: NEGATIVE
Spec Grav, UA: 1.025 (ref 1.010–1.025)
Urobilinogen, UA: 2 E.U./dL — AB
pH, UA: 6.5 (ref 5.0–8.0)

## 2020-08-01 MED ORDER — SULFAMETHOXAZOLE-TRIMETHOPRIM 800-160 MG PO TABS: 1.0000 | ORAL_TABLET | Freq: Two times a day (BID) | ORAL | 0 refills | Status: DC

## 2020-08-01 NOTE — Progress Notes (Signed)
Treat with bactrim ds twice daily for five days.

## 2020-08-01 NOTE — Progress Notes (Signed)
Acute Office Visit  Subjective:    Patient ID: Mercedes Webb, female    DOB: 04-02-1956, 65 y.o.   MRN: [id number]  Chief Complaint  Patient presents with  . Urinary Tract Infection    HPI Patient is in today for evaluation of possible urinary tract infection. She states that she has had burning and pain with urination since Friday. She has had urinary frequency and not producing very much urine with each time she urinates. Symptom have been going on since Friday. She has increased her intake of water and vitamin c. She has been taking OTC AZO. She has also been taking tylenol and alternating with ibuprofen. She states these have helped some, but she states that she still just doesn't feel well. She denies nausea, vomiting, or abdominal pain. She denies fever or headache. She denies back and flank pain.   Past Medical History:  Diagnosis Date  . Anxiety   . Cancer ([place]) 2016   Skin CA squamous cell on bilateral feet removed  . Cervical dysplasia    h/o  . Colon polyps    polyps on first scope ~ 2006, no recurrent polyps ~ 2001 and in 2016.   . Diabetes mellitus without complication ([place])   . Endometriosis   . Family history of adverse reaction to anesthesia    Mother had difficulty waking & nausea  . Headache   . Hypertension   . Insomnia   . Pancreatitis 01/2016  . Recurrent cold sores     Past Surgical History:  Procedure Laterality Date  . ABDOMINAL HYSTERECTOMY     tah/bso- endometriosis  . CHOLECYSTECTOMY    . ERCP N/A 02/03/2016   Procedure: ENDOSCOPIC RETROGRADE CHOLANGIOPANCREATOGRAPHY (ERCP);  Surgeon: Carol Ada, MD;  Location: [facility] ENDOSCOPY;  Service: Endoscopy;  Laterality: N/A;  . KNEE ARTHROSCOPY    . tonsillectomy    . TONSILLECTOMY    . TUBAL LIGATION      Family History  Problem Relation Age of Onset  . Diabetes Father   . Heart disease Father   . Breast cancer Sister 36  . Diabetes Brother   . Colon cancer Neg Hx   . Ovarian cancer Neg Hx      Social History   Socioeconomic History  . Marital status: Divorced    Spouse name: Not on file  . Number of children: Not on file  . Years of education: Not on file  . Highest education level: Not on file  Occupational History  . Not on file  Tobacco Use  . Smoking status: Former Smoker    Quit date: 04/30/1988    Years since quitting: 32.2  . Smokeless tobacco: Never Used  Vaping Use  . Vaping Use: Never used  Substance and Sexual Activity  . Alcohol use: Yes    Comment: occas  . Drug use: No  . Sexual activity: Yes    Birth control/protection: Surgical  Other Topics Concern  . Not on file  Social History Narrative  . Not on file   Social Determinants of Health   Financial Resource Strain: Not on file  Food Insecurity: Not on file  Transportation Needs: Not on file  Physical Activity: Not on file  Stress: Not on file  Social Connections: Not on file  Intimate Partner Violence: Not on file    Outpatient Medications Prior to Visit  Medication Sig Dispense Refill  . AMBIEN CR 12.5 MG CR tablet Take 12.5 mg by mouth at bedtime.    Marland Kitchen  Ascorbic Acid (VITAMIN C) 1000 MG tablet Take 1,000 mg by mouth daily.    Marland Kitchen atorvastatin (LIPITOR) 10 MG tablet TAKE 1 TABLET (10 MG TOTAL) BY MOUTH NIGHTLY. 90 tablet 0  . blood glucose meter kit and supplies Dispense based on patient and insurance preference. Use to check fasting blood sugar once daily. 1 each 0  . Calcium Carbonate (CALTRATE 600 PO) Take 1 tablet by mouth daily.    Marland Kitchen losartan (COZAAR) 25 MG tablet TAKE 1 TABLET BY MOUTH DAILY 90 tablet 0  . metFORMIN (GLUCOPHAGE) 1000 MG tablet TAKE 1 TABLET BY MOUTH EVERY 12 HOURS WITH MEALS 180 tablet 0  . Multiple Vitamins-Minerals (CENTRUM SILVER PO) Take 1 tablet by mouth daily.    Marland Kitchen omega-3 acid ethyl esters (LOVAZA) 1 g capsule Take 2 capsules (2 g total) by mouth 2 (two) times daily. 180 capsule 3  . potassium chloride (KLOR-CON) 10 MEQ tablet TAKE 1 TABLET (10 MEQ TOTAL) BY  MOUTH DAILY. 90 tablet 0  . QC LO-DOSE ASPIRIN 81 MG EC tablet TAKE 1 TABLET (81 MG TOTAL) BY MOUTH DAILY. 90 tablet 3  . triamterene-hydrochlorothiazide (MAXZIDE-25) 37.5-25 MG tablet TAKE 1 TABLET BY MOUTH DAILY. 90 tablet 0  . valACYclovir (VALTREX) 500 MG tablet TAKE 1 TABLET BY MOUTH DAILY. 90 tablet 0  . venlafaxine XR (EFFEXOR-XR) 150 MG 24 hr capsule     . Vitamin D, Ergocalciferol, (DRISDOL) 1.25 MG (50000 UNIT) CAPS capsule TAKE 1 CAPSULE BY MOUTH EVERY 7 DAYS. 12 capsule 0   No facility-administered medications prior to visit.    Allergies  Allergen Reactions  . Prozac [Fluoxetine Hcl] Other (See Comments)    sweating    Review of Systems  Constitutional: Negative for activity change, chills and fever.  HENT: Negative for congestion and sinus pain.   Eyes: Negative.   Respiratory: Negative for cough, shortness of breath and wheezing.   Cardiovascular: Negative for chest pain and palpitations.  Gastrointestinal: Negative for abdominal pain, constipation, nausea and vomiting.  Genitourinary: Positive for dysuria, frequency and urgency.  Musculoskeletal: Negative for back pain and myalgias.  Skin: Negative for rash.  Neurological: Negative for dizziness, weakness and headaches.  Psychiatric/Behavioral: The patient is not nervous/anxious.   All other systems reviewed and are negative.      Objective:    Physical Exam Vitals and nursing note reviewed.  Constitutional:      Appearance: Normal appearance. She is well-developed.  HENT:     Head: Normocephalic and atraumatic.  Eyes:     Pupils: Pupils are equal, round, and reactive to light.  Cardiovascular:     Rate and Rhythm: Normal rate and regular rhythm.     Pulses: Normal pulses.     Heart sounds: Normal heart sounds.  Pulmonary:     Effort: Pulmonary effort is normal.     Breath sounds: Normal breath sounds.  Abdominal:     General: Bowel sounds are normal.     Palpations: Abdomen is soft.     Tenderness:  There is no abdominal tenderness.  Genitourinary:    Comments: Urine sample is positive for moderate WBC.  Musculoskeletal:        General: Normal range of motion.     Cervical back: Normal range of motion and neck supple.  Skin:    General: Skin is warm and dry.     Capillary Refill: Capillary refill takes less than 2 seconds.  Neurological:     General: No focal deficit present.  Mental Status: She is alert and oriented to person, place, and time.  Psychiatric:        Mood and Affect: Mood normal.        Behavior: Behavior normal.        Thought Content: Thought content normal.        Judgment: Judgment normal.     Today's Vitals   08/01/20 0937  BP: 128/74  Pulse: 81  Temp: (!) 96.1 F (35.6 C)  SpO2: 98%  Weight: 189 lb 9.6 oz (86 kg)  Height: 5' 7"  (1.702 m)   Body mass index is 29.7 kg/m.   Wt Readings from Last 3 Encounters:  08/01/20 189 lb 9.6 oz (86 kg)  05/05/20 182 lb 11.2 oz (82.9 kg)  12/31/19 180 lb 11.2 oz (82 kg)    Health Maintenance Due  Topic Date Due  . Hepatitis C Screening  Never done  . FOOT EXAM  Never done  . OPHTHALMOLOGY EXAM  Never done  . TETANUS/TDAP  Never done  . DEXA SCAN  Never done  . MAMMOGRAM  11/22/2019    There are no preventive care reminders to display for this patient.   Lab Results  Component Value Date   TSH 3.700 05/05/2020   Lab Results  Component Value Date   WBC 6.9 05/05/2020   HGB 13.8 05/05/2020   HCT 41.2 05/05/2020   MCV 88 05/05/2020   PLT 330 05/05/2020   Lab Results  Component Value Date   NA 140 05/05/2020   K 3.9 05/05/2020   CO2 24 05/05/2020   GLUCOSE 125 (H) 05/05/2020   BUN 11 05/05/2020   CREATININE 0.96 05/05/2020   BILITOT 0.4 05/05/2020   ALKPHOS 119 05/05/2020   AST 29 05/05/2020   ALT 50 (H) 05/05/2020   PROT 8.0 05/05/2020   ALBUMIN 4.7 05/05/2020   CALCIUM 10.0 05/05/2020   ANIONGAP 7 02/02/2016   Lab Results  Component Value Date   CHOL 147 05/05/2020   Lab  Results  Component Value Date   HDL 41 05/05/2020   Lab Results  Component Value Date   LDLCALC 74 05/05/2020   Lab Results  Component Value Date   TRIG 191 (H) 05/05/2020   Lab Results  Component Value Date   CHOLHDL 3.6 05/05/2020   Lab Results  Component Value Date   HGBA1C 5.9 (H) 05/05/2020       Assessment & Plan:  1. Dysuria Urine dip positive for moderate WBC. Will treat with antibiotics and send urine for culture and sensitivity. Adjust antibiotics as indicated.  - POCT Urinalysis Dipstick  2. Urinary tract infection without hematuria, site unspecified Start bactrim DS twice daily for 5 days. Send urine for culture and sensitivity and adjust antibiotics as indicated. Advised her to continue with OTC AZO and increased water intake.  - sulfamethoxazole-trimethoprim (BACTRIM DS) 800-160 MG tablet; Take 1 tablet by mouth 2 (two) times daily.  Dispense: 10 tablet; Refill: 0 - Urine Culture    Problem List Items Addressed This Visit      Genitourinary   Urinary tract infection without hematuria   Relevant Medications   sulfamethoxazole-trimethoprim (BACTRIM DS) 800-160 MG tablet     Other   Dysuria - Primary   Relevant Orders   POCT Urinalysis Dipstick (Completed)   Hematuria   Relevant Orders   Urine Culture       Meds ordered this encounter  Medications  . sulfamethoxazole-trimethoprim (BACTRIM DS) 800-160 MG tablet  Sig: Take 1 tablet by mouth 2 (two) times daily.    Dispense:  10 tablet    Refill:  0    Order Specific Question:   Supervising Provider    Answer:   Beatrice Lecher D [[id number]]   Time spent with the patient was approximately 25 minutes. This time included reviewing progress notes, labs, imaging studies, and discussing plan for follow up.   Ronnell Freshwater, NP

## 2020-08-01 NOTE — Patient Instructions (Signed)
Urinary Tract Infection, Adult A urinary tract infection (UTI) is an infection of any part of the urinary tract. The urinary tract includes:  The kidneys.  The ureters.  The bladder.  The urethra. These organs make, store, and get rid of pee (urine) in the body. What are the causes? This infection is caused by germs (bacteria) in your genital area. These germs grow and cause swelling (inflammation) of your urinary tract. What increases the risk? The following factors may make you more likely to develop this condition:  Using a small, thin tube (catheter) to drain pee.  Not being able to control when you pee or poop (incontinence).  Being female. If you are female, these things can increase the risk: ? Using these methods to prevent pregnancy:  A medicine that kills sperm (spermicide).  A device that blocks sperm (diaphragm). ? Having low levels of a female hormone (estrogen). ? Being pregnant. You are more likely to develop this condition if:  You have genes that add to your risk.  You are sexually active.  You take antibiotic medicines.  You have trouble peeing because of: ? A prostate that is bigger than normal, if you are female. ? A blockage in the part of your body that drains pee from the bladder. ? A kidney stone. ? A nerve condition that affects your bladder. ? Not getting enough to drink. ? Not peeing often enough.  You have other conditions, such as: ? Diabetes. ? A weak disease-fighting system (immune system). ? Sickle cell disease. ? Gout. ? Injury of the spine. What are the signs or symptoms? Symptoms of this condition include:  Needing to pee right away.  Peeing small amounts often.  Pain or burning when peeing.  Blood in the pee.  Pee that smells bad or not like normal.  Trouble peeing.  Pee that is cloudy.  Fluid coming from the vagina, if you are female.  Pain in the belly or lower back. Other symptoms include:  Vomiting.  Not  feeling hungry.  Feeling mixed up (confused). This may be the first symptom in older adults.  Being tired and grouchy (irritable).  A fever.  Watery poop (diarrhea). How is this treated?  Taking antibiotic medicine.  Taking other medicines.  Drinking enough water. In some cases, you may need to see a specialist. Follow these instructions at home: Medicines  Take over-the-counter and prescription medicines only as told by your doctor.  If you were prescribed an antibiotic medicine, take it as told by your doctor. Do not stop taking it even if you start to feel better. General instructions  Make sure you: ? Pee until your bladder is empty. ? Do not hold pee for a long time. ? Empty your bladder after sex. ? Wipe from front to back after peeing or pooping if you are a female. Use each tissue one time when you wipe.  Drink enough fluid to keep your pee pale yellow.  Keep all follow-up visits.   Contact a doctor if:  You do not get better after 1-2 days.  Your symptoms go away and then come back. Get help right away if:  You have very bad back pain.  You have very bad pain in your lower belly.  You have a fever.  You have chills.  You feeling like you will vomit or you vomit. Summary  A urinary tract infection (UTI) is an infection of any part of the urinary tract.  This condition is caused by   germs in your genital area.  There are many risk factors for a UTI.  Treatment includes antibiotic medicines.  Drink enough fluid to keep your pee pale yellow. This information is not intended to replace advice given to you by your health care provider. Make sure you discuss any questions you have with your health care provider. Document Revised: 11/27/2019 Document Reviewed: 11/27/2019 Elsevier Patient Education  2021 Elsevier Inc.  

## 2020-08-04 LAB — URINE CULTURE

## 2020-08-04 NOTE — Progress Notes (Signed)
Normal flora. Treated with bactrim

## 2020-09-27 ENCOUNTER — Other Ambulatory Visit: Payer: Self-pay | Admitting: Physician Assistant

## 2020-09-27 DIAGNOSIS — R809 Proteinuria, unspecified: Secondary | ICD-10-CM

## 2020-09-27 DIAGNOSIS — I152 Hypertension secondary to endocrine disorders: Secondary | ICD-10-CM

## 2020-10-11 ENCOUNTER — Other Ambulatory Visit: Payer: Self-pay | Admitting: Physician Assistant

## 2020-10-11 DIAGNOSIS — E1169 Type 2 diabetes mellitus with other specified complication: Secondary | ICD-10-CM

## 2020-10-12 ENCOUNTER — Ambulatory Visit: Payer: Medicare Other | Admitting: Physician Assistant

## 2020-10-25 ENCOUNTER — Other Ambulatory Visit: Payer: Self-pay | Admitting: Physician Assistant

## 2020-10-25 DIAGNOSIS — E782 Mixed hyperlipidemia: Secondary | ICD-10-CM

## 2020-10-25 DIAGNOSIS — E876 Hypokalemia: Secondary | ICD-10-CM

## 2020-10-25 DIAGNOSIS — B009 Herpesviral infection, unspecified: Secondary | ICD-10-CM

## 2020-10-25 DIAGNOSIS — I10 Essential (primary) hypertension: Secondary | ICD-10-CM

## 2020-12-23 ENCOUNTER — Telehealth: Payer: Self-pay | Admitting: Physician Assistant

## 2020-12-23 ENCOUNTER — Other Ambulatory Visit: Payer: Self-pay | Admitting: Physician Assistant

## 2020-12-23 DIAGNOSIS — E1159 Type 2 diabetes mellitus with other circulatory complications: Secondary | ICD-10-CM

## 2020-12-23 DIAGNOSIS — I152 Hypertension secondary to endocrine disorders: Secondary | ICD-10-CM

## 2020-12-23 DIAGNOSIS — R809 Proteinuria, unspecified: Secondary | ICD-10-CM

## 2020-12-23 DIAGNOSIS — E1129 Type 2 diabetes mellitus with other diabetic kidney complication: Secondary | ICD-10-CM

## 2020-12-23 NOTE — Telephone Encounter (Signed)
Please contact patient to schedule an apt per last AVS for medication refills. AS, CMA

## 2021-01-09 ENCOUNTER — Ambulatory Visit (INDEPENDENT_AMBULATORY_CARE_PROVIDER_SITE_OTHER): Payer: Medicare Other | Admitting: Physician Assistant

## 2021-01-09 ENCOUNTER — Encounter: Payer: Self-pay | Admitting: Physician Assistant

## 2021-01-09 ENCOUNTER — Other Ambulatory Visit: Payer: Self-pay

## 2021-01-09 VITALS — BP 129/77 | HR 90 | Temp 97.9°F | Ht 67.0 in | Wt 168.0 lb

## 2021-01-09 DIAGNOSIS — Z23 Encounter for immunization: Secondary | ICD-10-CM

## 2021-01-09 DIAGNOSIS — F39 Unspecified mood [affective] disorder: Secondary | ICD-10-CM | POA: Diagnosis not present

## 2021-01-09 DIAGNOSIS — E782 Mixed hyperlipidemia: Secondary | ICD-10-CM

## 2021-01-09 DIAGNOSIS — E1159 Type 2 diabetes mellitus with other circulatory complications: Secondary | ICD-10-CM

## 2021-01-09 DIAGNOSIS — E1129 Type 2 diabetes mellitus with other diabetic kidney complication: Secondary | ICD-10-CM

## 2021-01-09 DIAGNOSIS — E1169 Type 2 diabetes mellitus with other specified complication: Secondary | ICD-10-CM | POA: Diagnosis not present

## 2021-01-09 DIAGNOSIS — R809 Proteinuria, unspecified: Secondary | ICD-10-CM

## 2021-01-09 DIAGNOSIS — I152 Hypertension secondary to endocrine disorders: Secondary | ICD-10-CM

## 2021-01-09 LAB — POCT GLYCOSYLATED HEMOGLOBIN (HGB A1C): Hemoglobin A1C: 14 % — AB (ref 4.0–5.6)

## 2021-01-09 LAB — POCT UA - MICROALBUMIN
Creatinine, POC: 10 mg/dL
Microalbumin Ur, POC: 30 mg/L

## 2021-01-09 MED ORDER — RYBELSUS 3 MG PO TABS: 3.0000 mg | ORAL_TABLET | Freq: Every day | ORAL | 0 refills | Status: DC

## 2021-01-09 MED ORDER — RYBELSUS 7 MG PO TABS: 7.0000 mg | ORAL_TABLET | Freq: Every day | ORAL | 0 refills | Status: DC

## 2021-01-09 NOTE — Assessment & Plan Note (Signed)
-  PHQ-9 score of 0. -Continue current medication regimen. -Continue to follow up with Psychiatry.

## 2021-01-09 NOTE — Patient Instructions (Signed)
Diabetes Mellitus and Nutrition, Adult When you have diabetes, or diabetes mellitus, it is very important to have healthy eating habits because your blood sugar (glucose) levels are greatly affected by what you eat and drink. Eating healthy foods in the right amounts, at about the same times every day, can help you:  Control your blood glucose.  Lower your risk of heart disease.  Improve your blood pressure.  Reach or maintain a healthy weight. What can affect my meal plan? Every person with diabetes is different, and each person has different needs for a meal plan. Your health care provider may recommend that you work with a dietitian to make a meal plan that is best for you. Your meal plan may vary depending on factors such as:  The calories you need.  The medicines you take.  Your weight.  Your blood glucose, blood pressure, and cholesterol levels.  Your activity level.  Other health conditions you have, such as heart or kidney disease. How do carbohydrates affect me? Carbohydrates, also called carbs, affect your blood glucose level more than any other type of food. Eating carbs naturally raises the amount of glucose in your blood. Carb counting is a method for keeping track of how many carbs you eat. Counting carbs is important to keep your blood glucose at a healthy level, especially if you use insulin or take certain oral diabetes medicines. It is important to know how many carbs you can safely have in each meal. This is different for every person. Your dietitian can help you calculate how many carbs you should have at each meal and for each snack. How does alcohol affect me? Alcohol can cause a sudden decrease in blood glucose (hypoglycemia), especially if you use insulin or take certain oral diabetes medicines. Hypoglycemia can be a life-threatening condition. Symptoms of hypoglycemia, such as sleepiness, dizziness, and confusion, are similar to symptoms of having too much  alcohol.  Do not drink alcohol if: ? Your health care provider tells you not to drink. ? You are pregnant, may be pregnant, or are planning to become pregnant.  If you drink alcohol: ? Do not drink on an empty stomach. ? Limit how much you use to:  0-1 drink a day for women.  0-2 drinks a day for men. ? Be aware of how much alcohol is in your drink. In the [place] one drink equals one 12 oz bottle of beer (355 mL), one 5 oz glass of wine (148 mL), or one 1 oz glass of hard liquor (44 mL). ? Keep yourself hydrated with water, diet soda, or unsweetened iced tea.  Keep in mind that regular soda, juice, and other mixers may contain a lot of sugar and must be counted as carbs. What are tips for following this plan? Reading food labels  Start by checking the serving size on the "Nutrition Facts" label of packaged foods and drinks. The amount of calories, carbs, fats, and other nutrients listed on the label is based on one serving of the item. Many items contain more than one serving per package.  Check the total grams (g) of carbs in one serving. You can calculate the number of servings of carbs in one serving by dividing the total carbs by 15. For example, if a food has 30 g of total carbs per serving, it would be equal to 2 servings of carbs.  Check the number of grams (g) of saturated fats and trans fats in one serving. Choose foods that have   a low amount or none of these fats.  Check the number of milligrams (mg) of salt (sodium) in one serving. Most people should limit total sodium intake to less than 2,300 mg per day.  Always check the nutrition information of foods labeled as "low-fat" or "nonfat." These foods may be higher in added sugar or refined carbs and should be avoided.  Talk to your dietitian to identify your daily goals for nutrients listed on the label. Shopping  Avoid buying canned, pre-made, or processed foods. These foods tend to be high in fat, sodium, and added  sugar.  Shop around the outside edge of the grocery store. This is where you will most often find fresh fruits and vegetables, bulk grains, fresh meats, and fresh dairy. Cooking  Use low-heat cooking methods, such as baking, instead of high-heat cooking methods like deep frying.  Cook using healthy oils, such as olive, canola, or sunflower oil.  Avoid cooking with butter, cream, or high-fat meats. Meal planning  Eat meals and snacks regularly, preferably at the same times every day. Avoid going long periods of time without eating.  Eat foods that are high in fiber, such as fresh fruits, vegetables, beans, and whole grains. Talk with your dietitian about how many servings of carbs you can eat at each meal.  Eat 4-6 oz (112-168 g) of lean protein each day, such as lean meat, chicken, fish, eggs, or tofu. One ounce (oz) of lean protein is equal to: ? 1 oz (28 g) of meat, chicken, or fish. ? 1 egg. ?  cup (62 g) of tofu.  Eat some foods each day that contain healthy fats, such as avocado, nuts, seeds, and fish.   What foods should I eat? Fruits Berries. Apples. Oranges. Peaches. Apricots. Plums. Grapes. Mango. Papaya. Pomegranate. Kiwi. Cherries. Vegetables Lettuce. Spinach. Leafy greens, including kale, chard, collard greens, and mustard greens. Beets. Cauliflower. Cabbage. Broccoli. Carrots. Green beans. Tomatoes. Peppers. Onions. Cucumbers. Brussels sprouts. Grains Whole grains, such as whole-wheat or whole-grain bread, crackers, tortillas, cereal, and pasta. Unsweetened oatmeal. Quinoa. Brown or wild rice. Meats and other proteins Seafood. Poultry without skin. Lean cuts of poultry and beef. Tofu. Nuts. Seeds. Dairy Low-fat or fat-free dairy products such as milk, yogurt, and cheese. The items listed above may not be a complete list of foods and beverages you can eat. Contact a dietitian for more information. What foods should I avoid? Fruits Fruits canned with  syrup. Vegetables Canned vegetables. Frozen vegetables with butter or cream sauce. Grains Refined white flour and flour products such as bread, pasta, snack foods, and cereals. Avoid all processed foods. Meats and other proteins Fatty cuts of meat. Poultry with skin. Breaded or fried meats. Processed meat. Avoid saturated fats. Dairy Full-fat yogurt, cheese, or milk. Beverages Sweetened drinks, such as soda or iced tea. The items listed above may not be a complete list of foods and beverages you should avoid. Contact a dietitian for more information. Questions to ask a health care provider  Do I need to meet with a diabetes educator?  Do I need to meet with a dietitian?  What number can I call if I have questions?  When are the best times to check my blood glucose? Where to find more information:  American Diabetes Association: diabetes.org  Academy of Nutrition and Dietetics: www.eatright.org  National Institute of Diabetes and Digestive and Kidney Diseases: www.niddk.nih.gov  Association of Diabetes Care and Education Specialists: www.diabeteseducator.org Summary  It is important to have healthy eating   habits because your blood sugar (glucose) levels are greatly affected by what you eat and drink.  A healthy meal plan will help you control your blood glucose and maintain a healthy lifestyle.  Your health care provider may recommend that you work with a dietitian to make a meal plan that is best for you.  Keep in mind that carbohydrates (carbs) and alcohol have immediate effects on your blood glucose levels. It is important to count carbs and to use alcohol carefully. This information is not intended to replace advice given to you by your health care provider. Make sure you discuss any questions you have with your health care provider. Document Revised: 03/24/2019 Document Reviewed: 03/24/2019 Elsevier Patient Education  2021 Elsevier Inc.  

## 2021-01-09 NOTE — Assessment & Plan Note (Addendum)
-  Will collect direct LDL. Last LDL 74 -Continue current medication regimen. -Will continue to monitor.

## 2021-01-09 NOTE — Progress Notes (Signed)
Established Patient Office Visit  Subjective:  Patient ID: Mercedes Webb, female    DOB: 1956-02-01  Age: 65 y.o. MRN: [id number]  CC:  Chief Complaint  Patient presents with   Hyperlipidemia   Hypertension   Diabetes     HPI Mercedes Webb presents for follow up on diabetes mellitus, hypertension and hyperlipidemia. Reports had Covid in mid-July and since then has noticed changes including gum swelling, fatigue, altered taste, increased thirst and urination.    Diabetes: Pt reports increased urination and thirst. Pt reports medication compliance. No hypoglycemic events. Checking glucose at home. FBS range 120-150. States has been eating more watermelon.   HTN: Pt denies chest pain, palpitations, dizziness or lower extremity swelling. Taking medication as directed without side effects. Reports blood pressure has been stable, <135/85. Pt follows a low salt diet. Drinking more fluids due to increased thirst.   HLD: Pt taking medication as directed without issues. Denies side effects including myalgias and RUQ pain. Patient reports having a hard time eating meat after Covid.  Mood: Followed by Psychiatry. Reports medication compliance. Denies mood changes, SI/HI.  Past Medical History:  Diagnosis Date   Anxiety    Cancer ([place]) 2016   Skin CA squamous cell on bilateral feet removed   Cervical dysplasia    h/o   Colon polyps    polyps on first scope ~ 2006, no recurrent polyps ~ 2001 and in 2016.    Diabetes mellitus without complication ([place])    Endometriosis    Family history of adverse reaction to anesthesia    Mother had difficulty waking & nausea   Headache    Hypertension    Insomnia    Pancreatitis 01/2016   Recurrent cold sores     Past Surgical History:  Procedure Laterality Date   ABDOMINAL HYSTERECTOMY     tah/bso- endometriosis   CHOLECYSTECTOMY     ERCP N/A 02/03/2016   Procedure: ENDOSCOPIC RETROGRADE CHOLANGIOPANCREATOGRAPHY (ERCP);  Surgeon:  Carol Ada, MD;  Location: [facility] ENDOSCOPY;  Service: Endoscopy;  Laterality: N/A;   KNEE ARTHROSCOPY     tonsillectomy     TONSILLECTOMY     TUBAL LIGATION      Family History  Problem Relation Age of Onset   Diabetes Father    Heart disease Father    Breast cancer Sister 60   Diabetes Brother    Colon cancer Neg Hx    Ovarian cancer Neg Hx     Social History   Socioeconomic History   Marital status: Divorced    Spouse name: Not on file   Number of children: Not on file   Years of education: Not on file   Highest education level: Not on file  Occupational History   Not on file  Tobacco Use   Smoking status: Former    Types: Cigarettes    Quit date: 04/30/1988    Years since quitting: 32.7   Smokeless tobacco: Never  Vaping Use   Vaping Use: Never used  Substance and Sexual Activity   Alcohol use: Yes    Comment: occas   Drug use: No   Sexual activity: Yes    Birth control/protection: Surgical  Other Topics Concern   Not on file  Social History Narrative   Not on file   Social Determinants of Health   Financial Resource Strain: Not on file  Food Insecurity: Not on file  Transportation Needs: Not on file  Physical Activity: Not on file  Stress: Not  on file  Social Connections: Not on file  Intimate Partner Violence: Not on file    Outpatient Medications Prior to Visit  Medication Sig Dispense Refill   AMBIEN CR 12.5 MG CR tablet Take 12.5 mg by mouth at bedtime.     Ascorbic Acid (VITAMIN C) 1000 MG tablet Take 1,000 mg by mouth daily.     atorvastatin (LIPITOR) 10 MG tablet TAKE 1 TABLET BY MOUTH NIGHTLY. 90 tablet 0   blood glucose meter kit and supplies Dispense based on patient and insurance preference. Use to check fasting blood sugar once daily. 1 each 0   Calcium Carbonate (CALTRATE 600 PO) Take 1 tablet by mouth daily.     losartan (COZAAR) 25 MG tablet **NEEDS APT FOR REFILLS**TAKE 1 TABLET BY MOUTH DAILY 30 tablet 0   metFORMIN (GLUCOPHAGE) 1000  MG tablet TAKE 1 TABLET BY MOUTH EVERY 12 HOURS WITH MEALS 180 tablet 0   Multiple Vitamins-Minerals (CENTRUM SILVER PO) Take 1 tablet by mouth daily.     omega-3 acid ethyl esters (LOVAZA) 1 g capsule Take 2 capsules (2 g total) by mouth 2 (two) times daily. 180 capsule 3   potassium chloride (KLOR-CON) 10 MEQ tablet TAKE 1 TABLET (10 MEQ TOTAL) BY MOUTH DAILY. 90 tablet 0   QC LO-DOSE ASPIRIN 81 MG EC tablet TAKE 1 TABLET (81 MG TOTAL) BY MOUTH DAILY. 90 tablet 3   sulfamethoxazole-trimethoprim (BACTRIM DS) 800-160 MG tablet Take 1 tablet by mouth 2 (two) times daily. 10 tablet 0   triamterene-hydrochlorothiazide (MAXZIDE-25) 37.5-25 MG tablet TAKE 1 TABLET BY MOUTH DAILY. 90 tablet 0   valACYclovir (VALTREX) 500 MG tablet TAKE 1 TABLET BY MOUTH DAILY. 90 tablet 0   venlafaxine XR (EFFEXOR-XR) 150 MG 24 hr capsule      Vitamin D, Ergocalciferol, (DRISDOL) 1.25 MG (50000 UNIT) CAPS capsule TAKE 1 CAPSULE BY MOUTH EVERY 7 DAYS. 12 capsule 0   No facility-administered medications prior to visit.    Allergies  Allergen Reactions   Prozac [Fluoxetine Hcl] Other (See Comments)    sweating    ROS Review of Systems Review of Systems:  A fourteen system review of systems was performed and found to be positive as per HPI.  Objective:    Physical Exam General:  Well Developed, well nourished, appropriate for stated age.  Neuro:  Alert and oriented,  extra-ocular muscles intact  HEENT:  Normocephalic, atraumatic, neck supple Skin:  no gross rash, warm, pink. Cardiac:  RRR, S1 S2 Respiratory:  CTA B/L, Not using accessory muscles, speaking in full sentences- unlabored. Vascular:  Ext warm, no cyanosis apprec.; cap RF less 2 sec. No edema  Psych:  No HI/SI, judgement and insight good, Euthymic mood. Full Affect.  BP 129/77   Pulse 90   Temp 97.9 F (36.6 C)   Ht _0  (1.702 m)   Wt 168 lb (76.2 kg)   SpO2 100%   BMI 26.31 kg/m  Wt Readings from Last 3 Encounters:  01/09/21 168 lb  (76.2 kg)  08/01/20 189 lb 9.6 oz (86 kg)  05/05/20 182 lb 11.2 oz (82.9 kg)     Health Maintenance Due  Topic Date Due   OPHTHALMOLOGY EXAM  Never done   Hepatitis C Screening  Never done   TETANUS/TDAP  Never done   Zoster Vaccines- Shingrix (1 of 2) Never done   DEXA SCAN  Never done   MAMMOGRAM  11/22/2019   COVID-19 Vaccine (4 - Booster for [place] series) 07/08/2020  INFLUENZA VACCINE  11/28/2020    There are no preventive care reminders to display for this patient.  Lab Results  Component Value Date   TSH 3.700 05/05/2020   Lab Results  Component Value Date   WBC 6.9 05/05/2020   HGB 13.8 05/05/2020   HCT 41.2 05/05/2020   MCV 88 05/05/2020   PLT 330 05/05/2020   Lab Results  Component Value Date   NA 140 05/05/2020   K 3.9 05/05/2020   CO2 24 05/05/2020   GLUCOSE 125 (H) 05/05/2020   BUN 11 05/05/2020   CREATININE 0.96 05/05/2020   BILITOT 0.4 05/05/2020   ALKPHOS 119 05/05/2020   AST 29 05/05/2020   ALT 50 (H) 05/05/2020   PROT 8.0 05/05/2020   ALBUMIN 4.7 05/05/2020   CALCIUM 10.0 05/05/2020   ANIONGAP 7 02/02/2016   Lab Results  Component Value Date   CHOL 147 05/05/2020   Lab Results  Component Value Date   HDL 41 05/05/2020   Lab Results  Component Value Date   LDLCALC 74 05/05/2020   Lab Results  Component Value Date   TRIG 191 (H) 05/05/2020   Lab Results  Component Value Date   CHOLHDL 3.6 05/05/2020   Lab Results  Component Value Date   HGBA1C 14.0 (A) 01/09/2021      Assessment & Plan:   Problem List Items Addressed This Visit       Cardiovascular and Mediastinum   Hypertension associated with diabetes ([place])    -Stable. -Continue current medication regimen. -Will collect CMP for medication monitoring.      Relevant Medications   Semaglutide (RYBELSUS) 3 MG TABS   Semaglutide (RYBELSUS) 7 MG TABS   Other Relevant Orders   Comp Met (CMET)   CBC w/Diff     Endocrine   Mixed diabetic hyperlipidemia associated  with type 2 diabetes mellitus ([place])    -Will collect direct LDL. Last LDL 74 -Continue current medication regimen. -Will continue to monitor.      Relevant Medications   Semaglutide (RYBELSUS) 3 MG TABS   Semaglutide (RYBELSUS) 7 MG TABS   Other Relevant Orders   Direct LDL   Diabetes mellitus ([place]) - Primary    -Uncontrolled, A1c today >14. Discussed with patient treatment adjustments and patient is agreeable to start GLP-1 agonists, prefers oral vs injectable. No personal or FHx of  MTC or MEN. Patient does have a hx of pancreatitis related to gallstones, hx of cholecystectomy. Discussed potential side effects including nausea and pancreatitis, advised to let me know if unable to tolerate medication (Rybelsus). Will start at 3 mg and titrate to 7 mg. -Dicussed reducing simple carbohydrates and glucose intake including natural sugars from fruits. -Follow up in 3 months.      Relevant Medications   Semaglutide (RYBELSUS) 3 MG TABS   Semaglutide (RYBELSUS) 7 MG TABS   Other Relevant Orders   POCT HgB A1C (Completed)   POCT UA - Microalbumin (Completed)   Microalbuminuria due to type 2 diabetes mellitus (HCC)    -UA microalbumin obtained, stable. -Will continue ARB therapy (Losartan).       Relevant Medications   Semaglutide (RYBELSUS) 3 MG TABS   Semaglutide (RYBELSUS) 7 MG TABS     Other   Mood disorder (HCC)- mixed anxiety and depression (Chronic)    -PHQ-9 score of 0. -Continue current medication regimen. -Continue to follow up with Psychiatry.        Other Visit Diagnoses     Need for  Streptococcus pneumoniae vaccination       Relevant Orders   Pneumococcal polysaccharide vaccine 23-valent greater than or equal to 2yo subcutaneous/IM (Completed)       Meds ordered this encounter  Medications   Semaglutide (RYBELSUS) 3 MG TABS    Sig: Take 3 mg by mouth daily.    Dispense:  30 tablet    Refill:  0    <SAMPLE>    Order Specific Question:   Supervising  Provider    Answer:   Beatrice Lecher D [[id number]]   Semaglutide (RYBELSUS) 7 MG TABS    Sig: Take 7 mg by mouth daily.    Dispense:  60 tablet    Refill:  0    Order Specific Question:   Supervising Provider    Answer:   Beatrice Lecher D [[id number]]     Follow-up: Return in about 3 months (around 04/10/2021) for DM- added med, HTN, mood.    Lorrene Reid, PA-C

## 2021-01-09 NOTE — Assessment & Plan Note (Signed)
-  UA microalbumin obtained, stable. -Will continue ARB therapy (Losartan).

## 2021-01-09 NOTE — Assessment & Plan Note (Addendum)
-  Uncontrolled, A1c today >14. Discussed with patient treatment adjustments and patient is agreeable to start GLP-1 agonists, prefers oral vs injectable. No personal or FHx of  MTC or MEN. Patient does have a hx of pancreatitis related to gallstones, hx of cholecystectomy. Discussed potential side effects including nausea and pancreatitis, advised to let me know if unable to tolerate medication (Rybelsus). Will start at 3 mg and titrate to 7 mg. -Dicussed reducing simple carbohydrates and glucose intake including natural sugars from fruits. -Follow up in 3 months.

## 2021-01-09 NOTE — Assessment & Plan Note (Signed)
-  Stable. -Continue current medication regimen. -Will collect CMP for medication monitoring.

## 2021-01-10 LAB — CBC WITH DIFFERENTIAL/PLATELET
Basophils Absolute: 0 10*3/uL (ref 0.0–0.2)
Basos: 1 %
EOS (ABSOLUTE): 0 10*3/uL (ref 0.0–0.4)
Eos: 0 %
Hematocrit: 46.5 % (ref 34.0–46.6)
Hemoglobin: 15.2 g/dL (ref 11.1–15.9)
Immature Grans (Abs): 0 10*3/uL (ref 0.0–0.1)
Immature Granulocytes: 0 %
Lymphocytes Absolute: 1.1 10*3/uL (ref 0.7–3.1)
Lymphs: 15 %
MCH: 30 pg (ref 26.6–33.0)
MCHC: 32.7 g/dL (ref 31.5–35.7)
MCV: 92 fL (ref 79–97)
Monocytes Absolute: 0.4 10*3/uL (ref 0.1–0.9)
Monocytes: 5 %
Neutrophils Absolute: 6.1 10*3/uL (ref 1.4–7.0)
Neutrophils: 79 %
Platelets: 334 10*3/uL (ref 150–450)
RBC: 5.07 x10E6/uL (ref 3.77–5.28)
RDW: 14.6 % (ref 11.7–15.4)
WBC: 7.7 10*3/uL (ref 3.4–10.8)

## 2021-01-10 LAB — COMPREHENSIVE METABOLIC PANEL
ALT: 26 IU/L (ref 0–32)
AST: 23 IU/L (ref 0–40)
Albumin/Globulin Ratio: 1.4 (ref 1.2–2.2)
Albumin: 4.5 g/dL (ref 3.8–4.8)
Alkaline Phosphatase: 153 IU/L — ABNORMAL HIGH (ref 44–121)
BUN/Creatinine Ratio: 10 — ABNORMAL LOW (ref 12–28)
BUN: 11 mg/dL (ref 8–27)
Bilirubin Total: 0.9 mg/dL (ref 0.0–1.2)
CO2: 19 mmol/L — ABNORMAL LOW (ref 20–29)
Calcium: 10 mg/dL (ref 8.7–10.3)
Chloride: 79 mmol/L — ABNORMAL LOW (ref 96–106)
Creatinine, Ser: 1.14 mg/dL — ABNORMAL HIGH (ref 0.57–1.00)
Globulin, Total: 3.3 g/dL (ref 1.5–4.5)
Glucose: 746 mg/dL (ref 65–99)
Potassium: 4.3 mmol/L (ref 3.5–5.2)
Sodium: 125 mmol/L — ABNORMAL LOW (ref 134–144)
Total Protein: 7.8 g/dL (ref 6.0–8.5)
eGFR: 53 mL/min/{1.73_m2} — ABNORMAL LOW (ref >59)

## 2021-01-10 LAB — LDL CHOLESTEROL, DIRECT: LDL Direct: 79 mg/dL (ref 0–99)

## 2021-01-13 ENCOUNTER — Emergency Department (HOSPITAL_COMMUNITY): Payer: Medicare Other

## 2021-01-13 ENCOUNTER — Other Ambulatory Visit: Payer: Self-pay

## 2021-01-13 ENCOUNTER — Inpatient Hospital Stay (HOSPITAL_COMMUNITY): Payer: Medicare Other

## 2021-01-13 ENCOUNTER — Inpatient Hospital Stay (HOSPITAL_COMMUNITY)
Admission: EM | Admit: 2021-01-13 | Discharge: 2021-01-18 | DRG: 637 | Disposition: A | Discharge status: Home / Self Care | Payer: Medicare Other | Attending: Internal Medicine | Admitting: Internal Medicine

## 2021-01-13 ENCOUNTER — Encounter (HOSPITAL_COMMUNITY): Payer: Self-pay | Admitting: Emergency Medicine

## 2021-01-13 DIAGNOSIS — Z888 Allergy status to other drugs, medicaments and biological substances status: Secondary | ICD-10-CM | POA: Diagnosis not present

## 2021-01-13 DIAGNOSIS — K859 Acute pancreatitis without necrosis or infection, unspecified: Secondary | ICD-10-CM | POA: Diagnosis present

## 2021-01-13 DIAGNOSIS — R6510 Systemic inflammatory response syndrome (SIRS) of non-infectious origin without acute organ dysfunction: Secondary | ICD-10-CM | POA: Diagnosis present

## 2021-01-13 DIAGNOSIS — N179 Acute kidney failure, unspecified: Secondary | ICD-10-CM | POA: Diagnosis present

## 2021-01-13 DIAGNOSIS — Z87891 Personal history of nicotine dependence: Secondary | ICD-10-CM

## 2021-01-13 DIAGNOSIS — I10 Essential (primary) hypertension: Secondary | ICD-10-CM | POA: Diagnosis present

## 2021-01-13 DIAGNOSIS — K861 Other chronic pancreatitis: Secondary | ICD-10-CM | POA: Diagnosis present

## 2021-01-13 DIAGNOSIS — R0682 Tachypnea, not elsewhere classified: Secondary | ICD-10-CM | POA: Diagnosis present

## 2021-01-13 DIAGNOSIS — Z7982 Long term (current) use of aspirin: Secondary | ICD-10-CM | POA: Diagnosis not present

## 2021-01-13 DIAGNOSIS — T68XXXA Hypothermia, initial encounter: Secondary | ICD-10-CM | POA: Diagnosis present

## 2021-01-13 DIAGNOSIS — Z87410 Personal history of cervical dysplasia: Secondary | ICD-10-CM

## 2021-01-13 DIAGNOSIS — E785 Hyperlipidemia, unspecified: Secondary | ICD-10-CM | POA: Diagnosis present

## 2021-01-13 DIAGNOSIS — Z794 Long term (current) use of insulin: Secondary | ICD-10-CM

## 2021-01-13 DIAGNOSIS — F05 Delirium due to known physiological condition: Secondary | ICD-10-CM | POA: Diagnosis not present

## 2021-01-13 DIAGNOSIS — E875 Hyperkalemia: Secondary | ICD-10-CM | POA: Diagnosis present

## 2021-01-13 DIAGNOSIS — E1110 Type 2 diabetes mellitus with ketoacidosis without coma: Secondary | ICD-10-CM | POA: Diagnosis present

## 2021-01-13 DIAGNOSIS — I959 Hypotension, unspecified: Secondary | ICD-10-CM | POA: Diagnosis present

## 2021-01-13 DIAGNOSIS — E876 Hypokalemia: Secondary | ICD-10-CM | POA: Diagnosis present

## 2021-01-13 DIAGNOSIS — Z7984 Long term (current) use of oral hypoglycemic drugs: Secondary | ICD-10-CM

## 2021-01-13 DIAGNOSIS — Z8719 Personal history of other diseases of the digestive system: Secondary | ICD-10-CM

## 2021-01-13 DIAGNOSIS — Z833 Family history of diabetes mellitus: Secondary | ICD-10-CM

## 2021-01-13 DIAGNOSIS — Z79899 Other long term (current) drug therapy: Secondary | ICD-10-CM | POA: Diagnosis not present

## 2021-01-13 DIAGNOSIS — E878 Other disorders of electrolyte and fluid balance, not elsewhere classified: Secondary | ICD-10-CM | POA: Diagnosis not present

## 2021-01-13 DIAGNOSIS — F419 Anxiety disorder, unspecified: Secondary | ICD-10-CM | POA: Diagnosis present

## 2021-01-13 DIAGNOSIS — Z8249 Family history of ischemic heart disease and other diseases of the circulatory system: Secondary | ICD-10-CM | POA: Diagnosis not present

## 2021-01-13 DIAGNOSIS — K529 Noninfective gastroenteritis and colitis, unspecified: Secondary | ICD-10-CM | POA: Diagnosis not present

## 2021-01-13 DIAGNOSIS — Z781 Physical restraint status: Secondary | ICD-10-CM

## 2021-01-13 DIAGNOSIS — R109 Unspecified abdominal pain: Secondary | ICD-10-CM

## 2021-01-13 DIAGNOSIS — E1310 Other specified diabetes mellitus with ketoacidosis without coma: Secondary | ICD-10-CM

## 2021-01-13 DIAGNOSIS — R9431 Abnormal electrocardiogram [ECG] [EKG]: Secondary | ICD-10-CM | POA: Diagnosis not present

## 2021-01-13 DIAGNOSIS — Z20822 Contact with and (suspected) exposure to covid-19: Secondary | ICD-10-CM | POA: Diagnosis present

## 2021-01-13 DIAGNOSIS — Z803 Family history of malignant neoplasm of breast: Secondary | ICD-10-CM

## 2021-01-13 DIAGNOSIS — R451 Restlessness and agitation: Secondary | ICD-10-CM | POA: Diagnosis not present

## 2021-01-13 DIAGNOSIS — G928 Other toxic encephalopathy: Secondary | ICD-10-CM | POA: Diagnosis not present

## 2021-01-13 LAB — BLOOD GAS, VENOUS
Acid-base deficit: 28.8 mmol/L — ABNORMAL HIGH (ref 0.0–2.0)
Bicarbonate: 3.5 mmol/L — ABNORMAL LOW (ref 20.0–28.0)
O2 Saturation: 98.1 %
Patient temperature: 98.6
pCO2, Ven: 16.6 mmHg — CL (ref 44.0–60.0)
pH, Ven: 6.959 — CL (ref 7.250–7.430)
pO2, Ven: 143 mmHg — ABNORMAL HIGH (ref 32.0–45.0)

## 2021-01-13 LAB — CBC
HCT: 40.1 % (ref 36.0–46.0)
Hemoglobin: 12.8 g/dL (ref 12.0–15.0)
MCH: 30.8 pg (ref 26.0–34.0)
MCHC: 31.9 g/dL (ref 30.0–36.0)
MCV: 96.4 fL (ref 80.0–100.0)
Platelets: 319 10*3/uL (ref 150–400)
RBC: 4.16 MIL/uL (ref 3.87–5.11)
RDW: 15.9 % — ABNORMAL HIGH (ref 11.5–15.5)
WBC: 19.4 10*3/uL — ABNORMAL HIGH (ref 4.0–10.5)
nRBC: 0 % (ref 0.0–0.2)

## 2021-01-13 LAB — CBC WITH DIFFERENTIAL/PLATELET
Abs Immature Granulocytes: 0.93 10*3/uL — ABNORMAL HIGH (ref 0.00–0.07)
Basophils Absolute: 0.1 10*3/uL (ref 0.0–0.1)
Basophils Relative: 1 %
Eosinophils Absolute: 0 10*3/uL (ref 0.0–0.5)
Eosinophils Relative: 0 %
HCT: 45.4 % (ref 36.0–46.0)
Hemoglobin: 14.2 g/dL (ref 12.0–15.0)
Immature Granulocytes: 4 %
Lymphocytes Relative: 7 %
Lymphs Abs: 1.7 10*3/uL (ref 0.7–4.0)
MCH: 30.3 pg (ref 26.0–34.0)
MCHC: 31.3 g/dL (ref 30.0–36.0)
MCV: 97 fL (ref 80.0–100.0)
Monocytes Absolute: 1.8 10*3/uL — ABNORMAL HIGH (ref 0.1–1.0)
Monocytes Relative: 7 %
Neutro Abs: 20.5 10*3/uL — ABNORMAL HIGH (ref 1.7–7.7)
Neutrophils Relative %: 81 %
Platelets: 469 10*3/uL — ABNORMAL HIGH (ref 150–400)
RBC: 4.68 MIL/uL (ref 3.87–5.11)
RDW: 15.8 % — ABNORMAL HIGH (ref 11.5–15.5)
WBC: 25.1 10*3/uL — ABNORMAL HIGH (ref 4.0–10.5)
nRBC: 0 % (ref 0.0–0.2)

## 2021-01-13 LAB — CBG MONITORING, ED
Glucose-Capillary: >600 mg/dL (ref 70–99)
Glucose-Capillary: >600 mg/dL (ref 70–99)
Glucose-Capillary: >600 mg/dL (ref 70–99)

## 2021-01-13 LAB — COMPREHENSIVE METABOLIC PANEL
ALT: 41 U/L (ref 0–44)
AST: 56 U/L — ABNORMAL HIGH (ref 15–41)
Albumin: 3.3 g/dL — ABNORMAL LOW (ref 3.5–5.0)
Alkaline Phosphatase: 125 U/L (ref 38–126)
Anion gap: 29 — ABNORMAL HIGH (ref 5–15)
BUN: 38 mg/dL — ABNORMAL HIGH (ref 8–23)
CO2: <7 mmol/L — ABNORMAL LOW (ref 22–32)
Calcium: 8.3 mg/dL — ABNORMAL LOW (ref 8.9–10.3)
Chloride: 88 mmol/L — ABNORMAL LOW (ref 98–111)
Creatinine, Ser: 3.05 mg/dL — ABNORMAL HIGH (ref 0.44–1.00)
GFR, Estimated: 16 mL/min — ABNORMAL LOW (ref >60)
Glucose, Bld: 1374 mg/dL (ref 70–99)
Potassium: 5.7 mmol/L — ABNORMAL HIGH (ref 3.5–5.1)
Sodium: 122 mmol/L — ABNORMAL LOW (ref 135–145)
Total Bilirubin: 2.4 mg/dL — ABNORMAL HIGH (ref 0.3–1.2)
Total Protein: 6.9 g/dL (ref 6.5–8.1)

## 2021-01-13 LAB — RAPID URINE DRUG SCREEN, HOSP PERFORMED
Amphetamines: NOT DETECTED
Barbiturates: NOT DETECTED
Benzodiazepines: NOT DETECTED
Cocaine: NOT DETECTED
Opiates: NOT DETECTED
Tetrahydrocannabinol: NOT DETECTED

## 2021-01-13 LAB — I-STAT CHEM 8, ED
BUN: 38 mg/dL — ABNORMAL HIGH (ref 8–23)
Calcium, Ion: 1.18 mmol/L (ref 1.15–1.40)
Chloride: 95 mmol/L — ABNORMAL LOW (ref 98–111)
Creatinine, Ser: 2.6 mg/dL — ABNORMAL HIGH (ref 0.44–1.00)
Glucose, Bld: >700 mg/dL (ref 70–99)
HCT: 43 % (ref 36.0–46.0)
Hemoglobin: 14.6 g/dL (ref 12.0–15.0)
Potassium: 5.6 mmol/L — ABNORMAL HIGH (ref 3.5–5.1)
Sodium: 119 mmol/L — CL (ref 135–145)
TCO2: 8 mmol/L — ABNORMAL LOW (ref 22–32)

## 2021-01-13 LAB — URINALYSIS, ROUTINE W REFLEX MICROSCOPIC
Bacteria, UA: NONE SEEN
Glucose, UA: >1000 mg/dL — AB
Ketones, ur: >80 mg/dL — AB
Nitrite: NEGATIVE
Protein, ur: 30 mg/dL — AB
Specific Gravity, Urine: 1.015 (ref 1.005–1.030)
pH: 6 (ref 5.0–8.0)

## 2021-01-13 LAB — BASIC METABOLIC PANEL
Anion gap: 24 — ABNORMAL HIGH (ref 5–15)
BUN: 44 mg/dL — ABNORMAL HIGH (ref 8–23)
CO2: 9 mmol/L — ABNORMAL LOW (ref 22–32)
Calcium: 8.4 mg/dL — ABNORMAL LOW (ref 8.9–10.3)
Chloride: 92 mmol/L — ABNORMAL LOW (ref 98–111)
Creatinine, Ser: 2.87 mg/dL — ABNORMAL HIGH (ref 0.44–1.00)
GFR, Estimated: 18 mL/min — ABNORMAL LOW (ref >60)
Glucose, Bld: 1257 mg/dL (ref 70–99)
Potassium: 4.5 mmol/L (ref 3.5–5.1)
Sodium: 125 mmol/L — ABNORMAL LOW (ref 135–145)

## 2021-01-13 LAB — PROTIME-INR
INR: 1.3 — ABNORMAL HIGH (ref 0.8–1.2)
Prothrombin Time: 16.3 seconds — ABNORMAL HIGH (ref 11.4–15.2)

## 2021-01-13 LAB — LACTIC ACID, PLASMA
Lactic Acid, Venous: 2.8 mmol/L (ref 0.5–1.9)
Lactic Acid, Venous: 2.7 mmol/L (ref 0.5–1.9)

## 2021-01-13 LAB — GLUCOSE, CAPILLARY
Glucose-Capillary: >600 mg/dL (ref 70–99)
Glucose-Capillary: >600 mg/dL (ref 70–99)
Glucose-Capillary: >600 mg/dL (ref 70–99)
Glucose-Capillary: >600 mg/dL (ref 70–99)

## 2021-01-13 LAB — LIPASE, BLOOD: Lipase: 320 U/L — ABNORMAL HIGH (ref 11–51)

## 2021-01-13 LAB — RESP PANEL BY RT-PCR (FLU A&B, COVID) ARPGX2
Influenza A by PCR: NEGATIVE
Influenza B by PCR: NEGATIVE
SARS Coronavirus 2 by RT PCR: NEGATIVE

## 2021-01-13 LAB — BETA-HYDROXYBUTYRIC ACID: Beta-Hydroxybutyric Acid: 10.92 mmol/L — ABNORMAL HIGH (ref 0.05–0.27)

## 2021-01-13 LAB — APTT: aPTT: 25 seconds (ref 24–36)

## 2021-01-13 LAB — TROPONIN I (HIGH SENSITIVITY): Troponin I (High Sensitivity): 7 ng/L (ref <18)

## 2021-01-13 MED ORDER — LACTATED RINGERS IV SOLN: INTRAVENOUS | Status: DC

## 2021-01-13 MED ORDER — VENLAFAXINE HCL ER 75 MG PO CP24
150.0000 mg | ORAL_CAPSULE | Freq: Every day | ORAL | Status: DC
  Filled 2021-01-13: qty 2

## 2021-01-13 MED ORDER — DEXTROSE IN LACTATED RINGERS 5 % IV SOLN: INTRAVENOUS | Status: DC

## 2021-01-13 MED ORDER — DEXTROSE 50 % IV SOLN
0.0000 mL | INTRAVENOUS | Status: DC | PRN
  Administered 2021-01-15: 25 mL via INTRAVENOUS
  Filled 2021-01-13: qty 50

## 2021-01-13 MED ORDER — INSULIN REGULAR(HUMAN) IN NACL 100-0.9 UT/100ML-% IV SOLN
INTRAVENOUS | Status: DC
  Administered 2021-01-13: 4.2 [IU]/h via INTRAVENOUS
  Administered 2021-01-14: 3.4 [IU]/h via INTRAVENOUS
  Filled 2021-01-13: qty 100

## 2021-01-13 MED ORDER — OMEGA-3-ACID ETHYL ESTERS 1 G PO CAPS
1.0000 g | ORAL_CAPSULE | Freq: Two times a day (BID) | ORAL | Status: DC
  Administered 2021-01-16 – 2021-01-18 (×5): 1 g via ORAL
  Filled 2021-01-13 (×10): qty 1

## 2021-01-13 MED ORDER — DEXTROSE 50 % IV SOLN: 0.0000 mL | INTRAVENOUS | Status: DC | PRN

## 2021-01-13 MED ORDER — ATORVASTATIN CALCIUM 10 MG PO TABS
10.0000 mg | ORAL_TABLET | Freq: Every day | ORAL | Status: DC
  Administered 2021-01-16 – 2021-01-18 (×3): 10 mg via ORAL
  Filled 2021-01-13 (×4): qty 1

## 2021-01-13 MED ORDER — LACTATED RINGERS IV BOLUS
20.0000 mL/kg | Freq: Once | INTRAVENOUS | Status: AC
  Administered 2021-01-13: 1524 mL via INTRAVENOUS

## 2021-01-13 MED ORDER — INSULIN REGULAR(HUMAN) IN NACL 100-0.9 UT/100ML-% IV SOLN
INTRAVENOUS | Status: DC
  Administered 2021-01-13: 4.2 [IU]/h via INTRAVENOUS
  Filled 2021-01-13: qty 100

## 2021-01-13 MED ORDER — CHLORHEXIDINE GLUCONATE CLOTH 2 % EX PADS
6.0000 | MEDICATED_PAD | Freq: Every day | CUTANEOUS | Status: DC
  Administered 2021-01-14 – 2021-01-18 (×5): 6 via TOPICAL

## 2021-01-13 MED ORDER — HEPARIN SODIUM (PORCINE) 5000 UNIT/ML IJ SOLN
5000.0000 [IU] | Freq: Three times a day (TID) | INTRAMUSCULAR | Status: DC
  Administered 2021-01-14 – 2021-01-18 (×13): 5000 [IU] via SUBCUTANEOUS
  Filled 2021-01-13 (×12): qty 1

## 2021-01-13 MED ORDER — LACTATED RINGERS IV BOLUS
1000.0000 mL | Freq: Once | INTRAVENOUS | Status: AC
  Administered 2021-01-13: 1000 mL via INTRAVENOUS

## 2021-01-13 NOTE — ED Notes (Signed)
I made MD aware of critical I Stat Chem 8.

## 2021-01-13 NOTE — H&P (Addendum)
 NAME:  Mercedes Webb, MRN:  [id number], DOB:  09/16/1955, LOS: 0 ADMISSION DATE:  01/13/2021, CONSULTATION DATE:  01/13/21 REFERRING MD:  Kommor (ED), CHIEF COMPLAINT:   AMS, hyperglycemia  History of Present Illness:  [name] is a 65 year old woman with a history of DM, HTN, HLD, here with weakness for the past 10 days.  Initially found unresponsive by friend on couch, called EMS.  On presentation to ED was alert and oriented, experiencing pain diffusely.   For me experiencing abdominal pain.   Poor memory right now.   Initially hypotensive, hypthermic and tachypneic.   BP improved somewhat with fluids.   IN ED: LR, 2.5L, Insulin Gtt  Started semaglutide on 9/12 3mg daily.  Unknown if she has been compliant.    BS unreadable (>600)  Initial PH 6.9 at 549pm  Initial BS on Chem panel 1374, AG 28.   WBC 25  Pertinent  Medical History  HLD  HTN DM Squamous cell cancer, skin  Endometriosis, hx of hysterectomy TAH/BSO Anxiety   Meds: lipitor 10, Ambiet 12.5, metformin 1000BID, , lovaza 1gm BID, KCL 10meq daily, semagludite3mg _ 7mg daily, maxide 37.5/25 daily, valtrex 500 daily Venlafaxine 150 daily, vit d  Former smoker, quit 30+ years ago Occasional ETOH.   Significant Hospital Events: Including procedures, antibiotic start and stop dates in addition to other pertinent events     Interim History / Subjective:    Objective   Blood pressure (!) 97/48, pulse 74, temperature (!) 93.2 F (34 C), temperature source Rectal, resp. rate (!) 28, height 5' 7" (1.702 m), weight 76.2 kg, SpO2 100 %.       No intake or output data in the 24 hours ending 01/13/21 1934 Filed Weights   01/13/21 1848  Weight: 76.2 kg    Examination: General: Sleepy, looks somewhat uncomfortable and fatigued.  Unable to recall what meds she has taken.  HENT: mm dry, pupils errl, eomi  Lungs: ctab Cardiovascular: rrr no mgr  Abdomen: mild tenderness to palpation  Extremities: no edema, some  eschar/lesions  Neuro: lethargic. Oriented to self and place   Resolved Hospital Problem list     Assessment & Plan:  DKA:   Unclear cause.  UA and Lipase pending. CXR poor quality but hx does not suggest PNA.   IV fluids, insulin gtt.  Monitor pseudohyponatremia, hyperkalemia.   Concern for underlying pancreatitis given hx and recent new medication.   Abd US ordered.   Hypothermia  EKG suggestive of possible septal infarct , unk when.  Trop pending.  Absolutely no chest pain or discomfort otherwise.   AKI: improving with fluids.     Best Practice (right click and "Reselect all SmartList Selections" daily)   Diet/type: NPO DVT prophylaxis: systemic heparin GI prophylaxis: N/A Lines: N/A Foley:  N/A Code Status:  full code Last date of multidisciplinary goals of care discussion []  Labs   CBC: Recent Labs  Lab 01/09/21 1206 01/13/21 1749 01/13/21 1804  WBC 7.7 25.1*  --   NEUTROABS 6.1 20.5*  --   HGB 15.2 14.2 14.6  HCT 46.5 45.4 43.0  MCV 92 97.0  --   PLT 334 469*  --     Basic Metabolic Panel: Recent Labs  Lab 01/09/21 1206 01/13/21 1749 01/13/21 1804  NA 125* 122* 119*  K 4.3 5.7* 5.6*  CL 79* 88* 95*  CO2 19* <7*  --   GLUCOSE 746* 1,374* >700*  BUN 11 38* 38*    CREATININE 1.14* 3.05* 2.60*  CALCIUM 10.0 8.3*  --    GFR: Estimated Creatinine Clearance: 22.6 mL/min (A) (by C-G formula based on SCr of 2.6 mg/dL (H)). Recent Labs  Lab 01/09/21 1206 01/13/21 1749  WBC 7.7 25.1*  LATICACIDVEN  --  2.8*    Liver Function Tests: Recent Labs  Lab 01/09/21 1206 01/13/21 1749  AST 23 56*  ALT 26 41  ALKPHOS 153* 125  BILITOT 0.9 2.4*  PROT 7.8 6.9  ALBUMIN 4.5 3.3*   No results for input(s): LIPASE, AMYLASE in the last 168 hours. No results for input(s): AMMONIA in the last 168 hours.  ABG    Component Value Date/Time   HCO3 3.5 (L) 01/13/2021 1749   TCO2 8 (L) 01/13/2021 1804   ACIDBASEDEF 28.8 (H) 01/13/2021 1749   O2SAT 98.1  01/13/2021 1749     Coagulation Profile: Recent Labs  Lab 01/13/21 1749  INR 1.3*    Cardiac Enzymes: No results for input(s): CKTOTAL, CKMB, CKMBINDEX, TROPONINI in the last 168 hours.  HbA1C: Hemoglobin A1C  Date/Time Value Ref Range Status  01/09/2021 12:34 PM 14.0 (A) 4.0 - 5.6 % Final    Comment:    >14  12/31/2019 09:12 AM 5.5 4.0 - 5.6 % Final   Hgb A1c MFr Bld  Date/Time Value Ref Range Status  05/05/2020 10:06 AM 5.9 (H) 4.8 - 5.6 % Final    Comment:             Prediabetes: 5.7 - 6.4          Diabetes: >6.4          Glycemic control for adults with diabetes: <7.0   10/20/2018 09:20 AM 6.7 (H) 4.8 - 5.6 % Final    Comment:             Prediabetes: 5.7 - 6.4          Diabetes: >6.4          Glycemic control for adults with diabetes: <7.0     CBG: Recent Labs  Lab 01/13/21 1720 01/13/21 1927  GLUCAP >600* >600*    Review of Systems:   As described in HPI Review of Systems  Constitutional:  Negative for fever.  HENT:  Negative for hearing loss.   Eyes:  Negative for blurred vision.  Respiratory:  Negative for cough.   Cardiovascular:  Negative for chest pain.  Gastrointestinal:  Positive for abdominal pain. Negative for heartburn.  Genitourinary:  Negative for dysuria.  Musculoskeletal:  Positive for myalgias.  Skin:  Negative for rash.  Neurological:  Negative for dizziness.  Endo/Heme/Allergies:  Negative for polydipsia.  Psychiatric/Behavioral:  Negative for depression.     Past Medical History:  She,  has a past medical history of Anxiety, Cancer ([place]) (2016), Cervical dysplasia, Colon polyps, Diabetes mellitus without complication ([place]), Endometriosis, Family history of adverse reaction to anesthesia, Headache, Hypertension, Insomnia, Pancreatitis (01/2016), and Recurrent cold sores.   Surgical History:   Past Surgical History:  Procedure Laterality Date   ABDOMINAL HYSTERECTOMY     tah/bso- endometriosis   CHOLECYSTECTOMY     ERCP  N/A 02/03/2016   Procedure: ENDOSCOPIC RETROGRADE CHOLANGIOPANCREATOGRAPHY (ERCP);  Surgeon: Carol Ada, MD;  Location: [facility] ENDOSCOPY;  Service: Endoscopy;  Laterality: N/A;   KNEE ARTHROSCOPY     tonsillectomy     TONSILLECTOMY     TUBAL LIGATION       Social History:   reports that she quit smoking about 32 years ago.  Her smoking use included cigarettes. She has never used smokeless tobacco. She reports current alcohol use. She reports that she does not use drugs.   Family History:  Her family history includes Breast cancer (age of onset: 49) in her sister; Diabetes in her brother and father; Heart disease in her father. There is no history of Colon cancer or Ovarian cancer.   Allergies Allergies  Allergen Reactions   Prozac [Fluoxetine Hcl] Other (See Comments)    sweating     Home Medications  Prior to Admission medications   Medication Sig Start Date End Date Taking? Authorizing Provider  AMBIEN CR 12.5 MG CR tablet Take 12.5 mg by mouth at bedtime. 11/26/19   [provider]  Ascorbic Acid (VITAMIN C) 1000 MG tablet Take 1,000 mg by mouth daily.    [provider]  atorvastatin (LIPITOR) 10 MG tablet TAKE 1 TABLET BY MOUTH NIGHTLY. 10/25/20   Abonza, Maritza, PA-C  blood glucose meter kit and supplies Dispense based on patient and insurance preference. Use to check fasting blood sugar once daily. 06/08/19   Opalski, Neoma Laming, DO  Calcium Carbonate (CALTRATE 600 PO) Take 1 tablet by mouth daily.    [provider]  losartan (COZAAR) 25 MG tablet **NEEDS APT FOR REFILLS**TAKE 1 TABLET BY MOUTH DAILY 12/23/20   Abonza, Maritza, PA-C  metFORMIN (GLUCOPHAGE) 1000 MG tablet TAKE 1 TABLET BY MOUTH EVERY 12 HOURS WITH MEALS 10/11/20   Abonza, Maritza, PA-C  Multiple Vitamins-Minerals (CENTRUM SILVER PO) Take 1 tablet by mouth daily.    [provider]  omega-3 acid ethyl esters (LOVAZA) 1 g capsule Take 2 capsules (2 g total) by mouth 2 (two) times daily.  04/24/18   Opalski, Neoma Laming, DO  potassium chloride (KLOR-CON) 10 MEQ tablet TAKE 1 TABLET (10 MEQ TOTAL) BY MOUTH DAILY. 10/25/20   Abonza, Maritza, PA-C  QC LO-DOSE ASPIRIN 81 MG EC tablet TAKE 1 TABLET (81 MG TOTAL) BY MOUTH DAILY. 01/14/20   Abonza, Maritza, PA-C  Semaglutide (RYBELSUS) 3 MG TABS Take 3 mg by mouth daily. 01/09/21 02/08/21  Lorrene Reid, PA-C  Semaglutide (RYBELSUS) 7 MG TABS Take 7 mg by mouth daily. 01/09/21   Lorrene Reid, PA-C  sulfamethoxazole-trimethoprim (BACTRIM DS) 800-160 MG tablet Take 1 tablet by mouth 2 (two) times daily. 08/01/20   Ronnell Freshwater, NP  triamterene-hydrochlorothiazide (MAXZIDE-25) 37.5-25 MG tablet TAKE 1 TABLET BY MOUTH DAILY. 10/25/20   Lorrene Reid, PA-C  valACYclovir (VALTREX) 500 MG tablet TAKE 1 TABLET BY MOUTH DAILY. 10/25/20   Lorrene Reid, PA-C  venlafaxine XR (EFFEXOR-XR) 150 MG 24 hr capsule  01/18/20   [provider]  Vitamin D, Ergocalciferol, (DRISDOL) 1.25 MG (50000 UNIT) CAPS capsule TAKE 1 CAPSULE BY MOUTH EVERY 7 DAYS. 03/31/20   Lorrene Reid, PA-C     Critical care time: 60 min

## 2021-01-13 NOTE — ED Provider Notes (Signed)
[facility] DEPT Provider Note   CSN: [id number] Arrival date & time: 01/13/21  1712     History Chief Complaint  Patient presents with   Hyperglycemia    Mercedes Webb is a 65 y.o. female with PMH insulin-dependent diabetes, HTN who presents to the emergency department for evaluation of altered mental status and hyperglycemia.  Patient coming from home via EMS with initial complaints of unreadable blood sugars, weaker for the last 10 days and patient initially found by friend unresponsive on her couch.  Last known well 24 hours ago.  On initial presentation, patient alert and oriented answering questions appropriately stating "everything hurts".  No prior history of DKA.  Patient states she is on insulin but is unsure if she has been taking it.  She has minimal recollection of the last 24 hours.  She arrives hypotensive with systolics in the 60F, hypothermic with initial temperature 93.2, tachypneic to 28.  Denies chest pain, abdominal pain, diarrhea, headache or other systemic symptoms.   Hyperglycemia Associated symptoms: abdominal pain, confusion and nausea   Associated symptoms: no chest pain, no dysuria, no fever, no shortness of breath and no vomiting       Past Medical History:  Diagnosis Date   Anxiety    Cancer ([place]) 2016   Skin CA squamous cell on bilateral feet removed   Cervical dysplasia    h/o   Colon polyps    polyps on first scope ~ 2006, no recurrent polyps ~ 2001 and in 2016.    Diabetes mellitus without complication ([place])    Endometriosis    Family history of adverse reaction to anesthesia    Mother had difficulty waking & nausea   Headache    Hypertension    Insomnia    Pancreatitis 01/2016   Recurrent cold sores     Patient Active Problem List   Diagnosis Date Noted   DKA (diabetic ketoacidosis) ([place]) 01/13/2021   Dysuria 08/01/2020   Urinary tract infection without hematuria 08/01/2020   Hematuria 08/01/2020    Microalbuminuria due to type 2 diabetes mellitus ([place]) 06/17/2019   Mixed diabetic hyperlipidemia associated with type 2 diabetes mellitus ([place]) 10/23/2018   Diabetes mellitus ([place]) 10/23/2018   Secondary hyperhidrosis- not focal in nature but primarily from waist up is the worst 10/23/2018   Acute stress reaction 08/20/2018   Excess, secretion, sweat 08/20/2018   Low serum progesterone 05/30/2018   Mood disorder ([place])- mixed anxiety and depression 01/20/2018   Panic attack 01/20/2018   Excessive sweating- likely due to hormonal 11/11/2017   Inactivity 11/11/2017   Elevated serum creatinine 09/04/2017   Elevated ALT measurement 09/04/2017   Obesity, Class I, BMI 30-34.9 06/10/2017   Postmenopausal- 30 yrs ago TAH "yrs ago" 11/12/2016   Hormone imbalance- txed by gyn 11/12/2016   Mixed hyperlipidemia 11/12/2016   Primary insomnia 11/12/2016   Adjustment disorder with mixed anxiety and depressed mood 11/12/2016   Elevated cholesterol with elevated triglycerides 09/28/2016   Vitamin D deficiency 09/28/2016   Hypokalemia 02/01/2016   Acute biliary pancreatitis 01/31/2016   Hypertension associated with diabetes ([place]) 01/31/2016   Generalized anxiety disorder 01/31/2016   Overweight 12/23/2014   Surgical menopause 12/23/2014   Status post abdominal hysterectomy 12/23/2014   History of cervical dysplasia 12/23/2014   SUI (stress urinary incontinence, female) 12/23/2014    Past Surgical History:  Procedure Laterality Date   ABDOMINAL HYSTERECTOMY     tah/bso- endometriosis   CHOLECYSTECTOMY     ERCP  N/A 02/03/2016   Procedure: ENDOSCOPIC RETROGRADE CHOLANGIOPANCREATOGRAPHY (ERCP);  Surgeon: Carol Ada, MD;  Location: [facility] ENDOSCOPY;  Service: Endoscopy;  Laterality: N/A;   KNEE ARTHROSCOPY     tonsillectomy     TONSILLECTOMY     TUBAL LIGATION       OB History     Gravida  1   Para      Term      Preterm      AB      Living  1      SAB      IAB      Ectopic       Multiple      Live Births  1           Family History  Problem Relation Age of Onset   Diabetes Father    Heart disease Father    Breast cancer Sister 68   Diabetes Brother    Colon cancer Neg Hx    Ovarian cancer Neg Hx     Social History   Tobacco Use   Smoking status: Former    Types: Cigarettes    Quit date: 04/30/1988    Years since quitting: 32.7   Smokeless tobacco: Never  Vaping Use   Vaping Use: Never used  Substance Use Topics   Alcohol use: Yes    Comment: occas   Drug use: No    Home Medications Prior to Admission medications   Medication Sig Start Date End Date Taking? Authorizing Provider  AMBIEN CR 12.5 MG CR tablet Take 12.5 mg by mouth at bedtime. 11/26/19   [provider]  Ascorbic Acid (VITAMIN C) 1000 MG tablet Take 1,000 mg by mouth daily.    [provider]  atorvastatin (LIPITOR) 10 MG tablet TAKE 1 TABLET BY MOUTH NIGHTLY. 10/25/20   Abonza, Maritza, PA-C  blood glucose meter kit and supplies Dispense based on patient and insurance preference. Use to check fasting blood sugar once daily. 06/08/19   Opalski, Neoma Laming, DO  Calcium Carbonate (CALTRATE 600 PO) Take 1 tablet by mouth daily.    [provider]  losartan (COZAAR) 25 MG tablet **NEEDS APT FOR REFILLS**TAKE 1 TABLET BY MOUTH DAILY 12/23/20   Abonza, Maritza, PA-C  metFORMIN (GLUCOPHAGE) 1000 MG tablet TAKE 1 TABLET BY MOUTH EVERY 12 HOURS WITH MEALS 10/11/20   Abonza, Maritza, PA-C  Multiple Vitamins-Minerals (CENTRUM SILVER PO) Take 1 tablet by mouth daily.    [provider]  omega-3 acid ethyl esters (LOVAZA) 1 g capsule Take 2 capsules (2 g total) by mouth 2 (two) times daily. 04/24/18   Opalski, Neoma Laming, DO  potassium chloride (KLOR-CON) 10 MEQ tablet TAKE 1 TABLET (10 MEQ TOTAL) BY MOUTH DAILY. 10/25/20   Abonza, Maritza, PA-C  QC LO-DOSE ASPIRIN 81 MG EC tablet TAKE 1 TABLET (81 MG TOTAL) BY MOUTH DAILY. 01/14/20   Abonza, Maritza, PA-C  Semaglutide  (RYBELSUS) 3 MG TABS Take 3 mg by mouth daily. 01/09/21 02/08/21  Lorrene Reid, PA-C  Semaglutide (RYBELSUS) 7 MG TABS Take 7 mg by mouth daily. 01/09/21   Lorrene Reid, PA-C  sulfamethoxazole-trimethoprim (BACTRIM DS) 800-160 MG tablet Take 1 tablet by mouth 2 (two) times daily. 08/01/20   Ronnell Freshwater, NP  triamterene-hydrochlorothiazide (MAXZIDE-25) 37.5-25 MG tablet TAKE 1 TABLET BY MOUTH DAILY. 10/25/20   Lorrene Reid, PA-C  valACYclovir (VALTREX) 500 MG tablet TAKE 1 TABLET BY MOUTH DAILY. 10/25/20   Lorrene Reid, PA-C  venlafaxine XR (EFFEXOR-XR) 150 MG  24 hr capsule  01/18/20   [provider]  Vitamin D, Ergocalciferol, (DRISDOL) 1.25 MG (50000 UNIT) CAPS capsule TAKE 1 CAPSULE BY MOUTH EVERY 7 DAYS. 03/31/20   Lorrene Reid, PA-C    Allergies    Prozac [fluoxetine hcl]  Review of Systems   Review of Systems  Constitutional:  Negative for chills and fever.  HENT:  Negative for ear pain and sore throat.   Eyes:  Negative for pain and visual disturbance.  Respiratory:  Negative for cough and shortness of breath.   Cardiovascular:  Negative for chest pain and palpitations.  Gastrointestinal:  Positive for abdominal pain and nausea. Negative for vomiting.  Genitourinary:  Negative for dysuria and hematuria.  Musculoskeletal:  Negative for arthralgias and back pain.  Skin:  Negative for color change and rash.  Neurological:  Negative for seizures and syncope.  Psychiatric/Behavioral:  Positive for confusion.   All other systems reviewed and are negative.  Physical Exam Updated Vital Signs BP (!) 136/99   Pulse 80   Temp (!) 95.3 F (35.2 C)   Resp (!) 27   Ht 5' 7" (1.702 m)   Wt 76.2 kg   SpO2 100%   BMI 26.31 kg/m   Physical Exam Vitals and nursing note reviewed.  Constitutional:      General: She is not in acute distress.    Appearance: She is well-developed. She is ill-appearing.  HENT:     Head: Normocephalic and atraumatic.  Eyes:      Conjunctiva/sclera: Conjunctivae normal.  Cardiovascular:     Rate and Rhythm: Normal rate and regular rhythm.     Heart sounds: No murmur heard. Pulmonary:     Effort: Pulmonary effort is normal. No respiratory distress.     Breath sounds: Normal breath sounds.     Comments: Tachypnea Abdominal:     Palpations: Abdomen is soft.     Tenderness: There is abdominal tenderness (generalized).  Musculoskeletal:     Cervical back: Neck supple.  Skin:    General: Skin is warm and dry.     Capillary Refill: Capillary refill takes more than 3 seconds.  Neurological:     Mental Status: She is alert.    ED Results / Procedures / Treatments   Labs (all labs ordered are listed, but only abnormal results are displayed) Labs Reviewed  LACTIC ACID, PLASMA - Abnormal; Notable for the following components:      Result Value   Lactic Acid, Venous 2.8 (*)    All other components within normal limits  LACTIC ACID, PLASMA - Abnormal; Notable for the following components:   Lactic Acid, Venous 2.7 (*)    All other components within normal limits  COMPREHENSIVE METABOLIC PANEL - Abnormal; Notable for the following components:   Sodium 122 (*)    Potassium 5.7 (*)    Chloride 88 (*)    CO2 <7 (*)    Glucose, Bld 1,374 (*)    BUN 38 (*)    Creatinine, Ser 3.05 (*)    Calcium 8.3 (*)    Albumin 3.3 (*)    AST 56 (*)    Total Bilirubin 2.4 (*)    GFR, Estimated 16 (*)    Anion gap 29 (*)    All other components within normal limits  CBC WITH DIFFERENTIAL/PLATELET - Abnormal; Notable for the following components:   WBC 25.1 (*)    RDW 15.8 (*)    Platelets 469 (*)    Neutro Abs 20.5 (*)  Monocytes Absolute 1.8 (*)    Abs Immature Granulocytes 0.93 (*)    All other components within normal limits  PROTIME-INR - Abnormal; Notable for the following components:   Prothrombin Time 16.3 (*)    INR 1.3 (*)    All other components within normal limits  URINALYSIS, ROUTINE W REFLEX MICROSCOPIC  - Abnormal; Notable for the following components:   Color, Urine YELLOW (*)    APPearance CLEAR (*)    Glucose, UA >1,000 (*)    Hgb urine dipstick LARGE (*)    Bilirubin Urine SMALL (*)    Ketones, ur >80 (*)    Protein, ur 30 (*)    Leukocytes,Ua TRACE (*)    All other components within normal limits  BLOOD GAS, VENOUS - Abnormal; Notable for the following components:   pH, Ven 6.959 (*)    pCO2, Ven 16.6 (*)    pO2, Ven 143.0 (*)    Bicarbonate 3.5 (*)    Acid-base deficit 28.8 (*)    All other components within normal limits  BETA-HYDROXYBUTYRIC ACID - Abnormal; Notable for the following components:   Beta-Hydroxybutyric Acid 10.92 (*)    All other components within normal limits  LIPASE, BLOOD - Abnormal; Notable for the following components:   Lipase 320 (*)    All other components within normal limits  CBC - Abnormal; Notable for the following components:   WBC 19.4 (*)    RDW 15.9 (*)    All other components within normal limits  BASIC METABOLIC PANEL - Abnormal; Notable for the following components:   Sodium 125 (*)    Chloride 92 (*)    CO2 9 (*)    Glucose, Bld 1,257 (*)    BUN 44 (*)    Creatinine, Ser 2.87 (*)    Calcium 8.4 (*)    GFR, Estimated 18 (*)    Anion gap 24 (*)    All other components within normal limits  GLUCOSE, CAPILLARY - Abnormal; Notable for the following components:   Glucose-Capillary >600 (*)    All other components within normal limits  GLUCOSE, CAPILLARY - Abnormal; Notable for the following components:   Glucose-Capillary >600 (*)    All other components within normal limits  CBG MONITORING, ED - Abnormal; Notable for the following components:   Glucose-Capillary >600 (*)    All other components within normal limits  I-STAT CHEM 8, ED - Abnormal; Notable for the following components:   Sodium 119 (*)    Potassium 5.6 (*)    Chloride 95 (*)    BUN 38 (*)    Creatinine, Ser 2.60 (*)    Glucose, Bld >700 (*)    TCO2 8 (*)     All other components within normal limits  CBG MONITORING, ED - Abnormal; Notable for the following components:   Glucose-Capillary >600 (*)    All other components within normal limits  CBG MONITORING, ED - Abnormal; Notable for the following components:   Glucose-Capillary >600 (*)    All other components within normal limits  RESP PANEL BY RT-PCR (FLU A&B, COVID) ARPGX2  CULTURE, BLOOD (SINGLE)  URINE CULTURE  MRSA NEXT GEN BY PCR, NASAL  APTT  RAPID URINE DRUG SCREEN, HOSP PERFORMED  HIV ANTIBODY (ROUTINE TESTING W REFLEX)  BASIC METABOLIC PANEL  BASIC METABOLIC PANEL  BASIC METABOLIC PANEL  HEMOGLOBIN A1C  TROPONIN I (HIGH SENSITIVITY)  TROPONIN I (HIGH SENSITIVITY)    EKG None  Radiology DG Chest [facility] 1 [address]  Result Date: 01/13/2021 CLINICAL DATA:  Elevated blood sugar and weakness, found unresponsive. EXAM: PORTABLE CHEST 1 VIEW COMPARISON:  January 31, 2016 FINDINGS: The study is limited secondary to patient positioning. There is no evidence of acute infiltrate, pleural effusion or pneumothorax. A well-defined hazy opacity is seen overlying the lateral aspect of the mid and upper left lung. Deviation of the heart and mediastinal structures is noted to the right. The visualized skeletal structures are unremarkable. IMPRESSION: Findings, as described above which are likely secondary to extensive patient rotation. Repeat examination is recommended. Electronically Signed   By: Virgina Norfolk M.D.   On: 01/13/2021 19:52    Procedures Procedures   Medications Ordered in ED Medications  lactated ringers infusion (0 mLs Intravenous Stopped 01/13/21 2121)  dextrose 5 % in lactated ringers infusion (0 mLs Intravenous Hold 01/13/21 2121)  heparin injection 5,000 Units (has no administration in time range)  insulin regular, human (MYXREDLIN) 100 units/ 100 mL infusion (4.2 Units/hr Intravenous New Bag/Given 01/13/21 2031)  lactated ringers infusion ( Intravenous New Bag/Given  01/13/21 2218)  dextrose 5 % in lactated ringers infusion (0 mLs Intravenous Hold 01/13/21 2122)  dextrose 50 % solution 0-50 mL (has no administration in time range)  atorvastatin (LIPITOR) tablet 10 mg (has no administration in time range)  omega-3 acid ethyl esters (LOVAZA) capsule 1 g (has no administration in time range)  venlafaxine XR (EFFEXOR-XR) 24 hr capsule 150 mg (has no administration in time range)  Chlorhexidine Gluconate Cloth 2 % PADS 6 each (has no administration in time range)  lactated ringers bolus 1,000 mL (0 mLs Intravenous Stopped 01/13/21 2120)  lactated ringers bolus 1,524 mL (0 mLs Intravenous Stopped 01/13/21 2120)  lactated ringers bolus 1,524 mL (1,524 mLs Intravenous New Bag/Given 01/13/21 2126)    ED Course  I have reviewed the triage vital signs and the nursing notes.  Pertinent labs & imaging results that were available during my care of the patient were reviewed by me and considered in my medical decision making (see chart for details).    MDM Rules/Calculators/A&P                          Patient seen the emergency department for evaluation of altered mental status.  Physical exam reveals an ill-appearing patient with generalized abdominal tenderness but is otherwise unremarkable.  Initial point-of-care glucose greater than 600.  Initial i-STAT with pseudohyponatremia to 119, hyperkalemia 5.6, hypochloremia 95, creatinine 2.6, glucose greater than 600.  VBG with a pH of 6.96, PCO2 16.6, bicarb 3.5.  Follow-up chemistry with pseudohyponatremia 122, potassium 5.7, CO2 less than 7, glucose 1374, BUN 38, creatinine 3.05, anion gap 29.  Initial CBC with a leukocytosis 25.1 likely stress demargination in the setting of DKA.  Lipase 320, beta hydroxybutyrate 10.9.  Patient given aggressive fluid resuscitation and started on an insulin drip.  Patient became hypotensive here in the emergency department but this resolved with fluid resuscitation.  Patient started on  broad-spectrum antibiotics due to patient's leukocytosis and unknown precipitating cause of her DKA.  Patient then admitted to the ICU.  CRITICAL CARE Performed by: Teressa Lower   Total critical care time: 30 minutes  Critical care time was exclusive of separately billable procedures and treating other patients.  Critical care was necessary to treat or prevent imminent or life-threatening deterioration.  Critical care was time spent personally by me on the following activities: development of treatment plan with patient and/or surrogate  as well as nursing, discussions with consultants, evaluation of patient's response to treatment, examination of patient, obtaining history from patient or surrogate, ordering and performing treatments and interventions, ordering and review of laboratory studies, ordering and review of radiographic studies, pulse oximetry and re-evaluation of patient's condition.   Final Clinical Impression(s) / ED Diagnoses Final diagnoses:  Diabetic ketoacidosis without coma associated with other specified diabetes mellitus [facility])    Rx / DC Orders ED Discharge Orders     None        , Debe Coder, MD 01/13/21 [phone]

## 2021-01-13 NOTE — ED Notes (Signed)
ED TO INPATIENT HANDOFF REPORT  Name/Age/Gender Mercedes Webb 65 y.o. female  Code Status    Code Status Orders  (From admission, onward)           Start     Ordered   01/13/21 2005  Full code  Continuous        01/13/21 2007           Code Status History     Date Active Date Inactive Code Status Order ID Comments User Context   01/31/2016 1037 02/03/2016 2351 Full Code [id number]  Norman Herrlich, MD ED      Advance Directive Documentation    Flowsheet Row Most Recent Value  Type of Advance Directive Living will  Pre-existing out of facility DNR order (yellow form or pink MOST form) --  "MOST" Form in Place? --       Home/SNF/Other Home  Chief Complaint DKA (diabetic ketoacidosis) ([place]) [E11.10]  Level of Care/Admitting Diagnosis ED Disposition     ED Disposition  Admit   Condition  --   [place]: [place] [[id number]]  Level of Care: ICU [6]  May admit patient to [facility] or [facility] if equivalent level of care is available:: Yes  Covid Evaluation: Asymptomatic Screening Protocol (No Symptoms)  Diagnosis: DKA (diabetic ketoacidosis) [facility]) [id number]  Admitting Physician: Collier Bullock [id number]  Attending Physician: Collier Bullock [id number]  Estimated length of stay: 3 - 4 days  Certification:: I certify this patient will need inpatient services for at least 2 midnights          Medical History Past Medical History:  Diagnosis Date   Anxiety    Cancer ([place]) 2016   Skin CA squamous cell on bilateral feet removed   Cervical dysplasia    h/o   Colon polyps    polyps on first scope ~ 2006, no recurrent polyps ~ 2001 and in 2016.    Diabetes mellitus without complication ([place])    Endometriosis    Family history of adverse reaction to anesthesia    Mother had difficulty waking & nausea   Headache    Hypertension    Insomnia    Pancreatitis 01/2016   Recurrent cold sores      Allergies Allergies  Allergen Reactions   Prozac [Fluoxetine Hcl] Other (See Comments)    sweating    IV Location/Drains/Wounds Patient Lines/Drains/Airways Status     Active Line/Drains/Airways     Name Placement date Placement time Site Days   Peripheral IV 01/13/21 20 G Right Antecubital 01/13/21  1738  Antecubital  less than 1   Peripheral IV 01/13/21 20 G Anterior;Distal;Left;Upper Arm 01/13/21  1845  Arm  less than 1   Urethral Catheter Jess P Temperature probe 16 Fr. 01/13/21  2023  Temperature probe  less than 1            Labs/Imaging Results for orders placed or performed during the hospital encounter of 01/13/21 (from the past 48 hour(s))  CBG monitoring, ED     Status: Abnormal   Collection Time: 01/13/21  5:20 PM  Result Value Ref Range   Glucose-Capillary >600 (HH) 70 - 99 mg/dL    Comment: Glucose reference range applies only to samples taken after fasting for at least 8 hours.  Lactic acid, plasma     Status: Abnormal   Collection Time: 01/13/21  5:49 PM  Result Value Ref Range   Lactic Acid, Venous 2.8 (HH) 0.5 -  1.9 mmol/L    Comment: CRITICAL RESULT CALLED TO, READ BACK BY AND VERIFIED WITH: DANIEL K @ 1841 BY BATTLET Performed at [facility], [place] [address], [place], [place] [address]   Comprehensive metabolic panel     Status: Abnormal   Collection Time: 01/13/21  5:49 PM  Result Value Ref Range   Sodium 122 (L) 135 - 145 mmol/L   Potassium 5.7 (H) 3.5 - 5.1 mmol/L   Chloride 88 (L) 98 - 111 mmol/L   CO2 <7 (L) 22 - 32 mmol/L    Comment: RESULT REPEATED AND VERIFIED   Glucose, Bld 1,374 (HH) 70 - 99 mg/dL    Comment: Glucose reference range applies only to samples taken after fasting for at least 8 hours. RESULTS CONFIRMED BY MANUAL DILUTION    BUN 38 (H) 8 - 23 mg/dL   Creatinine, Ser 3.05 (H) 0.44 - 1.00 mg/dL   Calcium 8.3 (L) 8.9 - 10.3 mg/dL   Total Protein 6.9 6.5 - 8.1 g/dL   Albumin 3.3 (L) 3.5 - 5.0 g/dL    AST 56 (H) 15 - 41 U/L   ALT 41 0 - 44 U/L   Alkaline Phosphatase 125 38 - 126 U/L   Total Bilirubin 2.4 (H) 0.3 - 1.2 mg/dL   GFR, Estimated 16 (L) >60 mL/min    Comment: (NOTE) Calculated using the CKD-EPI Creatinine Equation (2021)    Anion gap 29 (H) 5 - 15    Comment: Performed at [facility], [place] [address] [place], [place] [address]  CBC WITH DIFFERENTIAL     Status: Abnormal   Collection Time: 01/13/21  5:49 PM  Result Value Ref Range   WBC 25.1 (H) 4.0 - 10.5 K/uL   RBC 4.68 3.87 - 5.11 MIL/uL   Hemoglobin 14.2 12.0 - 15.0 g/dL   HCT 45.4 36.0 - 46.0 %   MCV 97.0 80.0 - 100.0 fL   MCH 30.3 26.0 - 34.0 pg   MCHC 31.3 30.0 - 36.0 g/dL   RDW 15.8 (H) 11.5 - 15.5 %   Platelets 469 (H) 150 - 400 K/uL   nRBC 0.0 0.0 - 0.2 %   Neutrophils Relative % 81 %   Neutro Abs 20.5 (H) 1.7 - 7.7 K/uL   Lymphocytes Relative 7 %   Lymphs Abs 1.7 0.7 - 4.0 K/uL   Monocytes Relative 7 %   Monocytes Absolute 1.8 (H) 0.1 - 1.0 K/uL   Eosinophils Relative 0 %   Eosinophils Absolute 0.0 0.0 - 0.5 K/uL   Basophils Relative 1 %   Basophils Absolute 0.1 0.0 - 0.1 K/uL   WBC Morphology MILD LEFT SHIFT (1-5% METAS, OCC MYELO, OCC BANDS)    Immature Granulocytes 4 %   Abs Immature Granulocytes 0.93 (H) 0.00 - 0.07 K/uL    Comment: Performed at [facility], [place] [address], [place], [place] [address]  Protime-INR     Status: Abnormal   Collection Time: 01/13/21  5:49 PM  Result Value Ref Range   Prothrombin Time 16.3 (H) 11.4 - 15.2 seconds   INR 1.3 (H) 0.8 - 1.2    Comment: (NOTE) INR goal varies based on device and disease states. Performed at [facility], [place] [address], [place], [place] [address]   APTT     Status: None   Collection Time: 01/13/21  5:49 PM  Result Value Ref Range   aPTT 25 24 - 36 seconds    Comment:  Performed at [facility], [place] [address], [place], [place] [address]  Blood gas, venous  (at [facility], not at [facility])     Status: Abnormal   Collection Time: 01/13/21  5:49 PM  Result Value Ref Range   pH, Ven 6.959 (LL) 7.250 - 7.430    Comment: CRITICAL RESULT CALLED TO, READ BACK BY AND VERIFIED WITH: WOODY, A '@1810'$  BY BATTLET    pCO2, Ven 16.6 (LL) 44.0 - 60.0 mmHg   pO2, Ven 143.0 (H) 32.0 - 45.0 mmHg   Bicarbonate 3.5 (L) 20.0 - 28.0 mmol/L   Acid-base deficit 28.8 (H) 0.0 - 2.0 mmol/L   O2 Saturation 98.1 %   Patient temperature 98.6     Comment: Performed at [facility], [place] [address], [place], [place] [address]  Beta-hydroxybutyric acid     Status: Abnormal   Collection Time: 01/13/21  5:49 PM  Result Value Ref Range   Beta-Hydroxybutyric Acid 10.92 (H) 0.05 - 0.27 mmol/L    Comment: RESULTS CONFIRMED BY MANUAL DILUTION Performed at [place] [address], [place], [place] [address]   I-stat chem 8, ED (not at [facility] or [facility])     Status: Abnormal   Collection Time: 01/13/21  6:04 PM  Result Value Ref Range   Sodium 119 (LL) 135 - 145 mmol/L   Potassium 5.6 (H) 3.5 - 5.1 mmol/L   Chloride 95 (L) 98 - 111 mmol/L   BUN 38 (H) 8 - 23 mg/dL   Creatinine, Ser 2.60 (H) 0.44 - 1.00 mg/dL   Glucose, Bld >700 (HH) 70 - 99 mg/dL    Comment: Glucose reference range applies only to samples taken after fasting for at least 8 hours.   Calcium, Ion 1.18 1.15 - 1.40 mmol/L   TCO2 8 (L) 22 - 32 mmol/L   Hemoglobin 14.6 12.0 - 15.0 g/dL   HCT 43.0 36.0 - 46.0 %   Comment NOTIFIED PHYSICIAN   CBG monitoring, ED     Status: Abnormal   Collection Time: 01/13/21  7:27 PM  Result Value Ref Range   Glucose-Capillary >600 (HH) 70 - 99 mg/dL    Comment: Glucose reference range applies only to samples taken after fasting for at least 8 hours.  CBG monitoring, ED     Status: Abnormal   Collection Time: 01/13/21  8:29 PM  Result Value Ref Range   Glucose-Capillary >600 (HH) 70 - 99 mg/dL    Comment: Glucose reference range applies only to  samples taken after fasting for at least 8 hours.   DG Chest Port 1 View  Result Date: 01/13/2021 CLINICAL DATA:  Elevated blood sugar and weakness, found unresponsive. EXAM: PORTABLE CHEST 1 VIEW COMPARISON:  January 31, 2016 FINDINGS: The study is limited secondary to patient positioning. There is no evidence of acute infiltrate, pleural effusion or pneumothorax. A well-defined hazy opacity is seen overlying the lateral aspect of the mid and upper left lung. Deviation of the heart and mediastinal structures is noted to the right. The visualized skeletal structures are unremarkable. IMPRESSION: Findings, as described above which are likely secondary to extensive patient rotation. Repeat examination is recommended. Electronically Signed   By: Virgina Norfolk M.D.   On: 01/13/2021 19:52    Pending Labs Unresulted Labs (From admission, onward)     Start     Ordered   01/13/21 [id number]  Basic metabolic panel  (Diabetes Ketoacidosis (DKA))  STAT Now then every 4 hours ,  STAT      01/13/21 2007   01/13/21 2007  Hemoglobin A1c  (Diabetes Ketoacidosis (DKA))  Once,   STAT       Comments: To assess prior glycemic control.    01/13/21 2007   01/13/21 2006  HIV Antibody (routine testing w rflx)  (HIV Antibody (Routine testing w reflex) panel)  Once,   STAT        01/13/21 2007   01/13/21 2005  CBC  (heparin)  Once,   STAT       Comments: Baseline for heparin therapy IF NOT ALREADY DRAWN.  Notify MD if PLT < 100 K.    01/13/21 2007   01/13/21 1956  Lipase, blood  Add-on,   AD        01/13/21 1955   01/13/21 1747  Rapid urine drug screen (hospital performed)  ONCE - STAT,   STAT        01/13/21 1746   01/13/21 1741  Lactic acid, plasma  (Undifferentiated presentation (screening labs and basic nursing orders))  Now then every 2 hours,   STAT      01/13/21 1740   01/13/21 1741  Blood culture (routine single)  (Undifferentiated presentation (screening labs and basic nursing orders))  ONCE - STAT,   STAT         01/13/21 1740   01/13/21 1741  Urinalysis, Routine w reflex microscopic Urine, Catheterized  (Undifferentiated presentation (screening labs and basic nursing orders))  ONCE - STAT,   STAT        01/13/21 1740   01/13/21 1741  Urine Culture  (Undifferentiated presentation (screening labs and basic nursing orders))  ONCE - STAT,   STAT       Question:  Indication  Answer:  Sepsis   01/13/21 1740            Vitals/Pain Today's Vitals   01/13/21 1845 01/13/21 1848 01/13/21 1900 01/13/21 1930  BP: (!) 117/97  (!) 97/48 (!) 95/51  Pulse: 74  74 76  Resp: (!) 25  (!) 28 (!) 32  Temp:      TempSrc:      SpO2: 100%  100% 100%  Weight:  76.2 kg    Height:  '5\' 7"'$  (1.702 m)      Isolation Precautions No active isolations  Medications Medications  lactated ringers infusion ( Intravenous New Bag/Given 01/13/21 1852)  dextrose 5 % in lactated ringers infusion (has no administration in time range)  heparin injection 5,000 Units (has no administration in time range)  lactated ringers bolus 1,524 mL (has no administration in time range)  insulin regular, human (MYXREDLIN) 100 units/ 100 mL infusion (4.2 Units/hr Intravenous New Bag/Given 01/13/21 2031)  lactated ringers infusion (has no administration in time range)  dextrose 5 % in lactated ringers infusion (has no administration in time range)  dextrose 50 % solution 0-50 mL (has no administration in time range)  lactated ringers bolus 1,000 mL (1,000 mLs Intravenous New Bag/Given 01/13/21 1834)  lactated ringers bolus 1,524 mL (1,524 mLs Intravenous New Bag/Given 01/13/21 1851)    Mobility walks

## 2021-01-13 NOTE — ED Triage Notes (Addendum)
BIBA Per EMS: Pt coming from home with c/o hight blood sugar and weakness. LKW 24 hours ago. Pt states she has been weaker for last 10 days. Pt friend found her unresponsive on her couch and called EMS. Hx diabetes. 1062m bolus given en route.  Vitals:  CBG - HI  90/50 BP  94% RA - 2L for comfort

## 2021-01-13 NOTE — ED Notes (Signed)
Patient placed on bair hugger at highest temp.

## 2021-01-14 ENCOUNTER — Inpatient Hospital Stay (HOSPITAL_COMMUNITY): Payer: Medicare Other

## 2021-01-14 ENCOUNTER — Encounter (HOSPITAL_COMMUNITY): Payer: Self-pay | Admitting: Pulmonary Disease

## 2021-01-14 DIAGNOSIS — E1110 Type 2 diabetes mellitus with ketoacidosis without coma: Secondary | ICD-10-CM | POA: Diagnosis not present

## 2021-01-14 DIAGNOSIS — K859 Acute pancreatitis without necrosis or infection, unspecified: Secondary | ICD-10-CM

## 2021-01-14 LAB — BASIC METABOLIC PANEL
Anion gap: 23 — ABNORMAL HIGH (ref 5–15)
Anion gap: 19 — ABNORMAL HIGH (ref 5–15)
Anion gap: 13 (ref 5–15)
Anion gap: 8 (ref 5–15)
BUN: 38 mg/dL — ABNORMAL HIGH (ref 8–23)
BUN: 32 mg/dL — ABNORMAL HIGH (ref 8–23)
BUN: 29 mg/dL — ABNORMAL HIGH (ref 8–23)
BUN: 27 mg/dL — ABNORMAL HIGH (ref 8–23)
CO2: 7 mmol/L — ABNORMAL LOW (ref 22–32)
CO2: 16 mmol/L — ABNORMAL LOW (ref 22–32)
CO2: 18 mmol/L — ABNORMAL LOW (ref 22–32)
CO2: 21 mmol/L — ABNORMAL LOW (ref 22–32)
Calcium: 8.9 mg/dL (ref 8.9–10.3)
Calcium: 9.1 mg/dL (ref 8.9–10.3)
Calcium: 9.1 mg/dL (ref 8.9–10.3)
Calcium: 8.8 mg/dL — ABNORMAL LOW (ref 8.9–10.3)
Chloride: 98 mmol/L (ref 98–111)
Chloride: 100 mmol/L (ref 98–111)
Chloride: 106 mmol/L (ref 98–111)
Chloride: 108 mmol/L (ref 98–111)
Creatinine, Ser: 2.38 mg/dL — ABNORMAL HIGH (ref 0.44–1.00)
Creatinine, Ser: 1.86 mg/dL — ABNORMAL HIGH (ref 0.44–1.00)
Creatinine, Ser: 1.29 mg/dL — ABNORMAL HIGH (ref 0.44–1.00)
Creatinine, Ser: 1.07 mg/dL — ABNORMAL HIGH (ref 0.44–1.00)
GFR, Estimated: 22 mL/min — ABNORMAL LOW (ref >60)
GFR, Estimated: 30 mL/min — ABNORMAL LOW (ref >60)
GFR, Estimated: 46 mL/min — ABNORMAL LOW (ref >60)
GFR, Estimated: 57 mL/min — ABNORMAL LOW (ref >60)
Glucose, Bld: 900 mg/dL (ref 70–99)
Glucose, Bld: 499 mg/dL — ABNORMAL HIGH (ref 70–99)
Glucose, Bld: 289 mg/dL — ABNORMAL HIGH (ref 70–99)
Glucose, Bld: 210 mg/dL — ABNORMAL HIGH (ref 70–99)
Potassium: 4.3 mmol/L (ref 3.5–5.1)
Potassium: 3.5 mmol/L (ref 3.5–5.1)
Potassium: 2.5 mmol/L — CL (ref 3.5–5.1)
Potassium: 2.8 mmol/L — ABNORMAL LOW (ref 3.5–5.1)
Sodium: 128 mmol/L — ABNORMAL LOW (ref 135–145)
Sodium: 135 mmol/L (ref 135–145)
Sodium: 137 mmol/L (ref 135–145)
Sodium: 137 mmol/L (ref 135–145)

## 2021-01-14 LAB — GLUCOSE, CAPILLARY
Glucose-Capillary: 114 mg/dL — ABNORMAL HIGH (ref 70–99)
Glucose-Capillary: 148 mg/dL — ABNORMAL HIGH (ref 70–99)
Glucose-Capillary: 155 mg/dL — ABNORMAL HIGH (ref 70–99)
Glucose-Capillary: 157 mg/dL — ABNORMAL HIGH (ref 70–99)
Glucose-Capillary: 165 mg/dL — ABNORMAL HIGH (ref 70–99)
Glucose-Capillary: 212 mg/dL — ABNORMAL HIGH (ref 70–99)
Glucose-Capillary: 212 mg/dL — ABNORMAL HIGH (ref 70–99)
Glucose-Capillary: 216 mg/dL — ABNORMAL HIGH (ref 70–99)
Glucose-Capillary: 220 mg/dL — ABNORMAL HIGH (ref 70–99)
Glucose-Capillary: 221 mg/dL — ABNORMAL HIGH (ref 70–99)
Glucose-Capillary: 235 mg/dL — ABNORMAL HIGH (ref 70–99)
Glucose-Capillary: 240 mg/dL — ABNORMAL HIGH (ref 70–99)
Glucose-Capillary: 245 mg/dL — ABNORMAL HIGH (ref 70–99)
Glucose-Capillary: 267 mg/dL — ABNORMAL HIGH (ref 70–99)
Glucose-Capillary: 282 mg/dL — ABNORMAL HIGH (ref 70–99)
Glucose-Capillary: 296 mg/dL — ABNORMAL HIGH (ref 70–99)
Glucose-Capillary: 351 mg/dL — ABNORMAL HIGH (ref 70–99)
Glucose-Capillary: 544 mg/dL (ref 70–99)
Glucose-Capillary: 565 mg/dL (ref 70–99)
Glucose-Capillary: 569 mg/dL (ref 70–99)
Glucose-Capillary: 589 mg/dL (ref 70–99)
Glucose-Capillary: >600 mg/dL (ref 70–99)
Glucose-Capillary: >600 mg/dL (ref 70–99)
Glucose-Capillary: >600 mg/dL (ref 70–99)
Glucose-Capillary: >600 mg/dL (ref 70–99)
Glucose-Capillary: >600 mg/dL (ref 70–99)
Glucose-Capillary: >600 mg/dL (ref 70–99)

## 2021-01-14 LAB — COMPREHENSIVE METABOLIC PANEL
ALT: 26 U/L (ref 0–44)
AST: 37 U/L (ref 15–41)
Albumin: 2.6 g/dL — ABNORMAL LOW (ref 3.5–5.0)
Alkaline Phosphatase: 77 U/L (ref 38–126)
Anion gap: 6 (ref 5–15)
BUN: 19 mg/dL (ref 8–23)
CO2: 21 mmol/L — ABNORMAL LOW (ref 22–32)
Calcium: 8.9 mg/dL (ref 8.9–10.3)
Chloride: 114 mmol/L — ABNORMAL HIGH (ref 98–111)
Creatinine, Ser: 0.84 mg/dL (ref 0.44–1.00)
GFR, Estimated: >60 mL/min (ref >60)
Glucose, Bld: 108 mg/dL — ABNORMAL HIGH (ref 70–99)
Potassium: 2.5 mmol/L — CL (ref 3.5–5.1)
Sodium: 141 mmol/L (ref 135–145)
Total Bilirubin: 0.6 mg/dL (ref 0.3–1.2)
Total Protein: 5.5 g/dL — ABNORMAL LOW (ref 6.5–8.1)

## 2021-01-14 LAB — LACTIC ACID, PLASMA: Lactic Acid, Venous: 4.1 mmol/L (ref 0.5–1.9)

## 2021-01-14 LAB — MRSA NEXT GEN BY PCR, NASAL: MRSA by PCR Next Gen: NOT DETECTED

## 2021-01-14 LAB — MAGNESIUM: Magnesium: 1.6 mg/dL — ABNORMAL LOW (ref 1.7–2.4)

## 2021-01-14 LAB — TROPONIN I (HIGH SENSITIVITY): Troponin I (High Sensitivity): 7 ng/L (ref <18)

## 2021-01-14 LAB — PHOSPHORUS: Phosphorus: 1.3 mg/dL — ABNORMAL LOW (ref 2.5–4.6)

## 2021-01-14 LAB — HIV ANTIBODY (ROUTINE TESTING W REFLEX): HIV Screen 4th Generation wRfx: NONREACTIVE

## 2021-01-14 MED ORDER — LACTATED RINGERS IV BOLUS
500.0000 mL | Freq: Once | INTRAVENOUS | Status: AC
  Administered 2021-01-14: 500 mL via INTRAVENOUS

## 2021-01-14 MED ORDER — POTASSIUM CHLORIDE 10 MEQ/100ML IV SOLN
10.0000 meq | INTRAVENOUS | Status: AC
  Administered 2021-01-15 (×6): 10 meq via INTRAVENOUS
  Filled 2021-01-14 (×6): qty 100

## 2021-01-14 MED ORDER — SODIUM CHLORIDE 0.9 % IV SOLN
2.0000 g | Freq: Once | INTRAVENOUS | Status: AC
  Administered 2021-01-14: 2 g via INTRAVENOUS
  Filled 2021-01-14: qty 2

## 2021-01-14 MED ORDER — FENTANYL CITRATE (PF) 100 MCG/2ML IJ SOLN
50.0000 ug | INTRAMUSCULAR | Status: DC | PRN
  Administered 2021-01-14 – 2021-01-15 (×8): 50 ug via INTRAVENOUS
  Filled 2021-01-14 (×8): qty 2

## 2021-01-14 MED ORDER — DEXMEDETOMIDINE HCL IN NACL 400 MCG/100ML IV SOLN
0.4000 ug/kg/h | INTRAVENOUS | Status: DC
  Administered 2021-01-14 (×2): 1.2 ug/kg/h via INTRAVENOUS
  Administered 2021-01-15: 1.1 ug/kg/h via INTRAVENOUS
  Administered 2021-01-15: 1 ug/kg/h via INTRAVENOUS
  Administered 2021-01-15: 0.7 ug/kg/h via INTRAVENOUS
  Administered 2021-01-15: 0.9 ug/kg/h via INTRAVENOUS
  Administered 2021-01-16: 0.8 ug/kg/h via INTRAVENOUS
  Filled 2021-01-14 (×8): qty 100

## 2021-01-14 MED ORDER — SODIUM CHLORIDE 0.45 % IV BOLUS
1000.0000 mL | Freq: Once | INTRAVENOUS | Status: AC
  Administered 2021-01-14: 1000 mL via INTRAVENOUS

## 2021-01-14 MED ORDER — PHENYLEPHRINE 40 MCG/ML (10ML) SYRINGE FOR IV PUSH (FOR BLOOD PRESSURE SUPPORT)
PREFILLED_SYRINGE | INTRAVENOUS | Status: AC
  Administered 2021-01-14: 100 ug via INTRAVENOUS
  Filled 2021-01-14: qty 10

## 2021-01-14 MED ORDER — POTASSIUM CHLORIDE 2 MEQ/ML IV SOLN
INTRAVENOUS | Status: DC
  Filled 2021-01-14 (×3): qty 1000

## 2021-01-14 MED ORDER — FENTANYL CITRATE (PF) 100 MCG/2ML IJ SOLN
INTRAMUSCULAR | Status: AC
  Filled 2021-01-14: qty 2

## 2021-01-14 MED ORDER — SODIUM CHLORIDE 0.9 % IV SOLN: 250.0000 mL | INTRAVENOUS | Status: DC

## 2021-01-14 MED ORDER — INSULIN DETEMIR 100 UNIT/ML ~~LOC~~ SOLN
0.3000 [IU]/kg | SUBCUTANEOUS | Status: DC
  Administered 2021-01-14: 23 [IU] via SUBCUTANEOUS
  Filled 2021-01-14 (×2): qty 0.23

## 2021-01-14 MED ORDER — LACTATED RINGERS IV BOLUS
1000.0000 mL | Freq: Once | INTRAVENOUS | Status: AC
  Administered 2021-01-14: 1000 mL via INTRAVENOUS

## 2021-01-14 MED ORDER — PHENYLEPHRINE 40 MCG/ML (10ML) SYRINGE FOR IV PUSH (FOR BLOOD PRESSURE SUPPORT): 100.0000 ug | PREFILLED_SYRINGE | Freq: Once | INTRAVENOUS | Status: AC | PRN

## 2021-01-14 MED ORDER — INSULIN ASPART 100 UNIT/ML IJ SOLN
0.0000 [IU] | INTRAMUSCULAR | Status: DC
  Administered 2021-01-14 – 2021-01-15 (×2): 2 [IU] via SUBCUTANEOUS
  Administered 2021-01-15: 3 [IU] via SUBCUTANEOUS

## 2021-01-14 MED ORDER — PHENYLEPHRINE HCL-NACL 20-0.9 MG/250ML-% IV SOLN
25.0000 ug/min | INTRAVENOUS | Status: DC
  Administered 2021-01-14: 140 ug/min via INTRAVENOUS
  Administered 2021-01-14 – 2021-01-15 (×3): 25 ug/min via INTRAVENOUS
  Administered 2021-01-15 (×2): 35 ug/min via INTRAVENOUS
  Filled 2021-01-14 (×6): qty 250

## 2021-01-14 MED ORDER — DEXMEDETOMIDINE HCL IN NACL 200 MCG/50ML IV SOLN
0.4000 ug/kg/h | INTRAVENOUS | Status: DC
  Administered 2021-01-14 (×2): 0.4 ug/kg/h via INTRAVENOUS
  Administered 2021-01-14: 1 ug/kg/h via INTRAVENOUS
  Administered 2021-01-14: 0.4 ug/kg/h via INTRAVENOUS
  Filled 2021-01-14 (×5): qty 50

## 2021-01-14 MED ORDER — POTASSIUM CHLORIDE 10 MEQ/100ML IV SOLN
10.0000 meq | INTRAVENOUS | Status: AC
  Administered 2021-01-14 (×4): 10 meq via INTRAVENOUS
  Filled 2021-01-14 (×4): qty 100

## 2021-01-14 MED ORDER — MIDAZOLAM HCL 2 MG/2ML IJ SOLN
2.0000 mg | Freq: Once | INTRAMUSCULAR | Status: AC
  Administered 2021-01-14: 2 mg via INTRAVENOUS
  Filled 2021-01-14: qty 2

## 2021-01-14 MED ORDER — VANCOMYCIN HCL 1500 MG/300ML IV SOLN
1500.0000 mg | Freq: Once | INTRAVENOUS | Status: AC
  Administered 2021-01-14: 1500 mg via INTRAVENOUS
  Filled 2021-01-14: qty 300

## 2021-01-14 NOTE — Progress Notes (Signed)
Inpatient Diabetes Program Recommendations  AACE/ADA: New Consensus Statement on Inpatient Glycemic Control (2015)  Target Ranges:  Prepandial:   less than 140 mg/dL      Peak postprandial:   less than 180 mg/dL (1-2 hours)      Critically ill patients:  140 - 180 mg/dL   Lab Results  Component Value Date   GLUCAP 267 (H) 01/14/2021   HGBA1C 14.0 (A) 01/09/2021    Review of Glycemic Control  Diabetes history: DM 2 Of note patient's A1C has gone from 5.9 to >14 in less than a year Outpatient Diabetes medications:  Ryblesus 3 mg daily, Metformin 1000 mg bid Current orders for Inpatient glycemic control:  IV insulin-DKA order set  Inpatient Diabetes Program Recommendations:    Patient with severe DKA.  A1C earlier in the year was pre-diabetes?   Please consider checking c-peptide and for insulin/GAD antibodies?   Patient will need insulin at d/c and seems to be insulin deficient. Will need education and insulin teaching when more alert and feeling better.   Once DKA is cleared, consider Semglee 18 units 2 hours prior to d/c of insulin drip, sensitive correction and meal coverage 3 units tid with meals.  Will ask RN's to begin bedside education when appropriate.    Thanks,  Adah Perl, RN, BC-ADM Inpatient Diabetes Coordinator Pager [phone]  (8a-5p)

## 2021-01-14 NOTE — Progress Notes (Signed)
Patient placed in restraint belt, she has been able to get out of the wrist restraints more than once since start of shift. We also re-applied mittens because she is pulling at anything she can reach. Precedex currently, and has been maxed out. Fentanyl has been given for pain, she is currently rating 5/10 on the PAINAD scale. Slowly titrating BP meds down due to increase in BP. May require another sedative but will try to exhaust every other options first.

## 2021-01-14 NOTE — Progress Notes (Signed)
Date and time results received: 01/14/21 0920 (use smartphrase ".now" to insert current time)  Test: K+ Critical Value: 2.5  Name of Provider Notified: CCM  Orders Received? Or Actions Taken?:  K+ Runs ordered

## 2021-01-14 NOTE — Progress Notes (Signed)
Initial Nutrition Assessment  DOCUMENTATION CODES:   Not applicable  INTERVENTION:   RD will add supplements once pt's diet is advanced  Pt at high refeed risk; recommend monitor potassium, magnesium and phosphorus labs daily until stable  NUTRITION DIAGNOSIS:   Inadequate oral intake related to acute illness as evidenced by NPO status.  GOAL:   Patient will meet greater than or equal to 90% of their needs  MONITOR:   Diet advancement, Labs, Weight trends, Skin, I & O's  REASON FOR ASSESSMENT:   Malnutrition Screening Tool    ASSESSMENT:   65 year old woman with a history of DM, HTN, HLD and endometriosis who is admitted with DKA  RD working remotely.  Unable to reach pt by phone. RD suspects pt with poor oral intake pta r/t DKA. Pt currently NPO on insulin drip. RD will add supplements once pt's diet is advanced. Pt is likely at refeed risk. Per chart, pt is down 12lbs(7%) since January; RD unsure how recently weight loss occurred. RD will obtain NFPE and history at follow up.   Medications reviewed and include: heparin, lovaza, precedex, LRS w/ 5% dextrose '@125ml'$ /hr, insulin, KCl  Labs reviewed: K 2.5(L), BUN 29(H), creat 1.29(H) Cbgs- 296, 267, 245, 235 x 24 hrs AIC 14.0(H)- 9/12  NUTRITION - FOCUSED PHYSICAL EXAM: Unable to perform at this time   Diet Order:   Diet Order             Diet NPO time specified  Diet effective now                  EDUCATION NEEDS:   Not appropriate for education at this time  Skin:  Skin Assessment: Reviewed RN Assessment  Last BM:  9/17- type 6  Height:   Ht Readings from Last 1 Encounters:  01/13/21 '5\' 7"'$  (1.702 m)    Weight:   Wt Readings from Last 1 Encounters:  01/13/21 77.4 kg    Ideal Body Weight:  61.36 kg  BMI:  Body mass index is 26.73 kg/m.  Estimated Nutritional Needs:   Kcal:  1800-2100kcal/day  Protein:  90-105g/day  Fluid:  1.5-1.8L/day  Koleen Distance MS, RD, LDN Please refer  to [facility] for RD and/or RD on-call/weekend/after hours pager

## 2021-01-14 NOTE — Progress Notes (Signed)
eLink Physician-Brief Progress Note Patient Name: Mercedes Webb DOB: 15-Sep-1955 MRN: [id number]   Date of Service  01/14/2021  HPI/Events of Note  Versed caused drop in MAP  eICU Interventions  MAP will rise on it's own, but will give 500cc of LR to speed it up     Intervention Category Major Interventions: Hypotension - evaluation and management  Tilden Dome 01/14/2021, 6:47 AM

## 2021-01-14 NOTE — Progress Notes (Signed)
[place] Progress Note Patient Name: Mercedes Webb DOB: 02-28-1956 MRN: [id number]   Date of Service  01/14/2021  HPI/Events of Note  Agitated;restless  eICU Interventions  - video'ed into room - spoke to RN's - versed '2mg'$  x 1  - precedex gtt - insulin gtt to continue     Intervention Category Major Interventions: Change in mental status - evaluation and management  Tilden Dome 01/14/2021, 6:25 AM

## 2021-01-14 NOTE — Progress Notes (Addendum)
eLink Physician-Brief Progress Note Patient Name: DALEA HENLY DOB: 1955/07/17 MRN: [id number]   Date of Service  01/14/2021  HPI/Events of Note  Temps rising 100.8 with no tyleol ordered. Pt maxed on precedex @ 1.2 and getting PRN fentanyl 50 mcg but remain restless and agitated. Lactic from 9/16 2.7 without repeat ordered.  Discussed with RN. On room air. MAP > 65.  S/p DKA, low K. Meeting Septic shock criteria, source not clear and not on antibiotics.   eICU Interventions  - s/p EGDT from ED. - Blood culture, CMP, LA, CT abd/pelvis ordered. CxR ordered. Vanc/cefepime-Pharmacy to dose ordered. De escalate if cultures neg. Covid, UA neg, Cr normal.  - follow UOP, keep MAP > 65.       Intervention Category Major Interventions: Sepsis - evaluation and management;Shock - evaluation and management  Elmer Sow 01/14/2021, 9:55 PM  22:58 CxR film seen. Treat as Pneumonia for now. As per RN, CT scan will not be possible, due to agitation will not be able to lay still. Follow CMP for any increased lipase or LFT . Hold off on CT scan for now. Aspiration precautions.   Camera eval:  Obese, discussed with RN. On room air. On neosynephrine gtt. Calmer now post fenta push.abdomen soft, but has these loose stools constantly since admit. - get Cl difficile statand enteric precautions   23:33 K 2.5, LA 4.1. hyperchloremic.  AG normal, CBG 114. - Kcl replacement ordered. Cr normal. Get a mag and po4 level also, if low replace. - 0.45 % Saline 1 lit bolus- has fluid loss from ? GI tract. Co2, LFT ok.   00:54 Low po4 and Mag, replacement ordered.

## 2021-01-14 NOTE — Progress Notes (Signed)
Pharmacy Antibiotic Note  Mercedes Webb is a 65 y.o. female admitted on 01/13/2021 with DKA.  Now has rising temp and meets criteria for septic shock.  Pharmacy has been consulted for Vancomycin + Cefepime dosing.  Plan: Cefepime 2gm IV q8h Vancomycin '1500mg'$  IV x1 then '750mg'$  IV q12h to target AUC 400-550 Check Vancomycin levels at steady state Monitor renal function and cx data   Height: '5\' 7"'$  (170.2 cm) Weight: 77.4 kg (170 lb 10.2 oz) IBW/kg (Calculated) : 61.6  Temp (24hrs), Avg:99.7 F (37.6 C), Min:96.8 F (36 C), Max:100.8 F (38.2 C)  Recent Labs  Lab 01/09/21 1206 01/13/21 1749 01/13/21 1804 01/13/21 2040 01/13/21 2317 01/14/21 0421 01/14/21 0756 01/14/21 1159 01/14/21 2215  WBC 7.7 25.1*  --  19.4*  --   --   --   --   --   CREATININE 1.14* 3.05*   < > 2.87* 2.38* 1.86* 1.29* 1.07* 0.84  LATICACIDVEN  --  2.8*  --  2.7*  --   --   --   --  4.1*   < > = values in this interval not displayed.    Estimated Creatinine Clearance: 70.6 mL/min (by C-G formula based on SCr of 0.84 mg/dL).    Allergies  Allergen Reactions   Prozac [Fluoxetine Hcl] Other (See Comments)    sweating    Antimicrobials this admission: 9/17 Vancomycin  >>  9/17 Cefepime >>   Dose adjustments this admission:  Microbiology results: 9/17 BCx:  9/16 UCx:   9/16 MRSA PCR: negative  Thank you for allowing pharmacy to be a part of this patient's care.  Netta Cedars PharmD 01/14/2021 11:55 PM

## 2021-01-14 NOTE — Progress Notes (Signed)
Remains agitated, optimize agitation management  Will start peripheral Neo-Synephrine

## 2021-01-14 NOTE — Progress Notes (Signed)
Sedation with Precedex dropping blood pressure  Required fluid resuscitation and Neo-Synephrine  Add fentanyl 50 mcg every as needed  Increased LR to 150 cc an hour

## 2021-01-14 NOTE — Progress Notes (Signed)
NAME:  Mercedes Webb, MRN:  [id number], DOB:  [birth year], LOS: 1 ADMISSION DATE:  01/13/2021, CONSULTATION DATE:  01/13/21 REFERRING MD:  Kommor (ED), CHIEF COMPLAINT:   AMS, hyperglycemia  History of Present Illness:  Mercedes Webb is a 65 year old woman with a history of DM, HTN, HLD, here with weakness for the past 10 days.  Initially found unresponsive by friend on couch, called EMS.  On presentation to ED was alert and oriented, experiencing pain diffusely.   Complaint of abdominal pain. Poor memory right now.   Initially hypotensive, hypthermic and tachypneic.   BP improved somewhat with fluids.   IN ED: LR, 2.5L, Insulin Gtt  Started semaglutide on 9/12 '3mg'$  daily.  Unknown if she has been compliant.    BS unreadable (>600)  Initial PH 6.9 at 549pm  Initial BS on Chem panel 1374, AG 28.   WBC 25  Pertinent  Medical History  HLD  HTN DM Squamous cell cancer, skin  Endometriosis, hx of hysterectomy TAH/BSO Anxiety   Meds: lipitor 10, Ambiet 12.5, metformin 1000BID, , lovaza 1gm BID, KCL 5mq daily, semagludite'3mg'$  _ '7mg'$  daily, maxide 37.5/25 daily, valtrex 500 daily Venlafaxine 150 daily, vit d  Former smoker, quit 30+ years ago Occasional ETOH.   Significant Hospital Events: Including procedures, antibiotic start and stop dates in addition to other pertinent events   9/17 agitated, requiring Precedex  Interim History / Subjective:  Agitated Requiring Precedex Not easily redirectable  Objective   Blood pressure (!) 114/44, pulse (!) 107, temperature 99.9 F (37.7 C), resp. rate (!) 23, height '5\' 7"'$  (1.702 m), weight 77.4 kg, SpO2 100 %.        Intake/Output Summary (Last 24 hours) at 01/14/2021 0802 Last data filed at 01/14/2021 0656 Gross per 24 hour  Intake 4744.5 ml  Output 2645 ml  Net 2099.5 ml   Filed Weights   01/13/21 1848 01/13/21 2200  Weight: 76.2 kg 77.4 kg    Examination: General: Chronically ill-appearing,  HENT: Dry oral mucosa Lungs:  Clear breath sounds bilaterally Cardiovascular: S1-S2 appreciated Abdomen: Soft, bowel sounds appreciated Extremities: no edema, some eschar/lesions  Neuro: Lethargy, does arouse easily  Resolved Hospital Problem list     Assessment & Plan:  DKA -May be secondary to pancreatitis -On insulin via Endo tool -Continue IV fluids  Hypokalemia -Potassium rounds  Pancreatitis Elevated lipase -Recently started semaglutide -Semaglutide may have caused or exacerbated pancreatitis -Does have a past history of pancreatitis -Abdominal ultrasound pending  Abnormal EKG -Troponin normal  Acute kidney injury-present on admission -Continue fluid resuscitation -Continue to trend renal functions  Discussed with Mercedes Webb at bedside Risk of decompensation remains high   Best Practice (right click and "Reselect all SmartList Selections" daily)   Diet/type: NPO DVT prophylaxis: systemic heparin GI prophylaxis: N/A Lines: N/A Foley:  N/A Code Status:  full code Last date of multidisciplinary goals of care discussion '[]'$   Labs   CBC: Recent Labs  Lab 01/09/21 1206 01/13/21 1749 01/13/21 1804 01/13/21 2040  WBC 7.7 25.1*  --  19.4*  NEUTROABS 6.1 20.5*  --   --   HGB 15.2 14.2 14.6 12.8  HCT 46.5 45.4 43.0 40.1  MCV 92 97.0  --  96.4  PLT 334 469*  --  319     Basic Metabolic Panel: Recent Labs  Lab 01/09/21 1206 01/13/21 1749 01/13/21 1804 01/13/21 2040 01/13/21 2317 01/14/21 0421  NA 125* 122* 119* 125* 128* 135  K 4.3 5.7* 5.6* 4.5  4.3 3.5  CL 79* 88* 95* 92* 98 100  CO2 19* <7*  --  9* 7* 16*  GLUCOSE 746* 1,374* >700* 1,257* 900* 499*  BUN 11 38* 38* 44* 38* 32*  CREATININE 1.14* 3.05* 2.60* 2.87* 2.38* 1.86*  CALCIUM 10.0 8.3*  --  8.4* 8.9 9.1    GFR: Estimated Creatinine Clearance: 31.9 mL/min (A) (by C-G formula based on SCr of 1.86 mg/dL (H)). Recent Labs  Lab 01/09/21 1206 01/13/21 1749 01/13/21 2040  WBC 7.7 25.1* 19.4*  LATICACIDVEN  --  2.8*  2.7*     Liver Function Tests: Recent Labs  Lab 01/09/21 1206 01/13/21 1749  AST 23 56*  ALT 26 41  ALKPHOS 153* 125  BILITOT 0.9 2.4*  PROT 7.8 6.9  ALBUMIN 4.5 3.3*    Recent Labs  Lab 01/13/21 1749  LIPASE 320*   No results for input(s): AMMONIA in the last 168 hours.  ABG    Component Value Date/Time   HCO3 3.5 (L) 01/13/2021 1749   TCO2 8 (L) 01/13/2021 1804   ACIDBASEDEF 28.8 (H) 01/13/2021 1749   O2SAT 98.1 01/13/2021 1749      Coagulation Profile: Recent Labs  Lab 01/13/21 1749  INR 1.3*     Cardiac Enzymes: No results for input(s): CKTOTAL, CKMB, CKMBINDEX, TROPONINI in the last 168 hours.  HbA1C: Hemoglobin A1C  Date/Time Value Ref Range Status  01/09/2021 12:34 PM 14.0 (A) 4.0 - 5.6 % Final    Comment:    >14  12/31/2019 09:12 AM 5.5 4.0 - 5.6 % Final   Hgb A1c MFr Bld  Date/Time Value Ref Range Status  05/05/2020 10:06 AM 5.9 (H) 4.8 - 5.6 % Final    Comment:             Prediabetes: 5.7 - 6.4          Diabetes: >6.4          Glycemic control for adults with diabetes: <7.0   10/20/2018 09:20 AM 6.7 (H) 4.8 - 5.6 % Final    Comment:             Prediabetes: 5.7 - 6.4          Diabetes: >6.4          Glycemic control for adults with diabetes: <7.0     CBG: Recent Labs  Lab 01/14/21 0436 01/14/21 0511 01/14/21 0619 01/14/21 0651 01/14/21 0714  GLUCAP 569* 544* 351* 282* 296*     Review of Systems:   As described in HPI Review of Systems  Constitutional:  Negative for fever.  HENT:  Negative for hearing loss.   Eyes:  Negative for blurred vision.  Respiratory:  Negative for cough.   Cardiovascular:  Negative for chest pain.  Gastrointestinal:  Positive for abdominal pain. Negative for heartburn.  Genitourinary:  Negative for dysuria.  Musculoskeletal:  Positive for myalgias.  Skin:  Negative for rash.  Neurological:  Negative for dizziness.  Endo/Heme/Allergies:  Negative for polydipsia.  Psychiatric/Behavioral:   Negative for depression.     Past Medical History:  She,  has a past medical history of Anxiety, Cancer ([place]) (2016), Cervical dysplasia, Colon polyps, Diabetes mellitus without complication ([place]), Endometriosis, Family history of adverse reaction to anesthesia, Headache, Hypertension, Insomnia, Pancreatitis (01/2016), and Recurrent cold sores.   Surgical History:   Past Surgical History:  Procedure Laterality Date   ABDOMINAL HYSTERECTOMY     tah/bso- endometriosis   CHOLECYSTECTOMY     ERCP N/A  02/03/2016   Procedure: ENDOSCOPIC RETROGRADE CHOLANGIOPANCREATOGRAPHY (ERCP);  Surgeon: Carol Ada, MD;  Location: [facility] ENDOSCOPY;  Service: Endoscopy;  Laterality: N/A;   KNEE ARTHROSCOPY     tonsillectomy     TONSILLECTOMY     TUBAL LIGATION       Social History:   reports that she quit smoking about 32 years ago. Her smoking use included cigarettes. She has never used smokeless tobacco. She reports current alcohol use. She reports that she does not use drugs.   Family History:  Her family history includes Breast cancer (age of onset: 70) in her sister; Diabetes in her brother and father; Heart disease in her father. There is no history of Colon cancer or Ovarian cancer.   Allergies Allergies  Allergen Reactions   Prozac [Fluoxetine Hcl] Other (See Comments)    sweating    The patient is critically ill with multiple organ systems failure and requires high complexity decision making for assessment and support, frequent evaluation and titration of therapies, application of advanced monitoring technologies and extensive interpretation of multiple databases. Critical Care Time devoted to patient care services described in this note independent of APP/resident time (if applicable)  is 35 minutes.   Sherrilyn Rist MD [facility] Personal pager: See Amion If unanswered, please page CCM On-call: [phone]

## 2021-01-15 ENCOUNTER — Inpatient Hospital Stay (HOSPITAL_COMMUNITY): Payer: Medicare Other

## 2021-01-15 ENCOUNTER — Inpatient Hospital Stay: Payer: Self-pay

## 2021-01-15 DIAGNOSIS — K859 Acute pancreatitis without necrosis or infection, unspecified: Secondary | ICD-10-CM | POA: Diagnosis not present

## 2021-01-15 DIAGNOSIS — E1110 Type 2 diabetes mellitus with ketoacidosis without coma: Secondary | ICD-10-CM | POA: Diagnosis not present

## 2021-01-15 LAB — BLOOD CULTURE ID PANEL (REFLEXED) - BCID2

## 2021-01-15 LAB — BASIC METABOLIC PANEL
Anion gap: 7 (ref 5–15)
BUN: 17 mg/dL (ref 8–23)
CO2: 22 mmol/L (ref 22–32)
Calcium: 8.4 mg/dL — ABNORMAL LOW (ref 8.9–10.3)
Chloride: 112 mmol/L — ABNORMAL HIGH (ref 98–111)
Creatinine, Ser: 0.79 mg/dL (ref 0.44–1.00)
GFR, Estimated: >60 mL/min (ref >60)
Glucose, Bld: 126 mg/dL — ABNORMAL HIGH (ref 70–99)
Potassium: 2.9 mmol/L — ABNORMAL LOW (ref 3.5–5.1)
Sodium: 141 mmol/L (ref 135–145)

## 2021-01-15 LAB — GLUCOSE, CAPILLARY
Glucose-Capillary: 132 mg/dL — ABNORMAL HIGH (ref 70–99)
Glucose-Capillary: 142 mg/dL — ABNORMAL HIGH (ref 70–99)
Glucose-Capillary: 162 mg/dL — ABNORMAL HIGH (ref 70–99)
Glucose-Capillary: 167 mg/dL — ABNORMAL HIGH (ref 70–99)
Glucose-Capillary: 173 mg/dL — ABNORMAL HIGH (ref 70–99)
Glucose-Capillary: 64 mg/dL — ABNORMAL LOW (ref 70–99)

## 2021-01-15 LAB — URINE CULTURE: Culture: NO GROWTH

## 2021-01-15 LAB — LACTIC ACID, PLASMA
Lactic Acid, Venous: 2.3 mmol/L (ref 0.5–1.9)
Lactic Acid, Venous: 3.1 mmol/L (ref 0.5–1.9)
Lactic Acid, Venous: 2 mmol/L (ref 0.5–1.9)
Lactic Acid, Venous: 1.2 mmol/L (ref 0.5–1.9)

## 2021-01-15 MED ORDER — LIP MEDEX EX OINT
TOPICAL_OINTMENT | CUTANEOUS | Status: AC
  Filled 2021-01-15: qty 7

## 2021-01-15 MED ORDER — INSULIN DETEMIR 100 UNIT/ML ~~LOC~~ SOLN
18.0000 [IU] | Freq: Every day | SUBCUTANEOUS | Status: DC
  Administered 2021-01-15: 18 [IU] via SUBCUTANEOUS
  Filled 2021-01-15 (×2): qty 0.18

## 2021-01-15 MED ORDER — MORPHINE SULFATE (PF) 2 MG/ML IV SOLN
2.0000 mg | INTRAVENOUS | Status: DC | PRN
  Administered 2021-01-15 – 2021-01-16 (×3): 2 mg via INTRAVENOUS
  Filled 2021-01-15 (×3): qty 1

## 2021-01-15 MED ORDER — SODIUM CHLORIDE 0.9 % IV SOLN
2.0000 g | Freq: Three times a day (TID) | INTRAVENOUS | Status: DC
  Administered 2021-01-15 – 2021-01-16 (×4): 2 g via INTRAVENOUS
  Filled 2021-01-15 (×4): qty 2

## 2021-01-15 MED ORDER — LORAZEPAM 2 MG/ML IJ SOLN
1.0000 mg | Freq: Once | INTRAMUSCULAR | Status: AC
  Administered 2021-01-15: 1 mg via INTRAVENOUS
  Filled 2021-01-15: qty 1

## 2021-01-15 MED ORDER — VANCOMYCIN HCL 750 MG/150ML IV SOLN
750.0000 mg | Freq: Two times a day (BID) | INTRAVENOUS | Status: DC
  Filled 2021-01-15: qty 150

## 2021-01-15 MED ORDER — SODIUM PHOSPHATES 45 MMOLE/15ML IV SOLN
30.0000 mmol | Freq: Once | INTRAVENOUS | Status: AC
  Administered 2021-01-15: 30 mmol via INTRAVENOUS
  Filled 2021-01-15: qty 10

## 2021-01-15 MED ORDER — CHLORHEXIDINE GLUCONATE 0.12 % MT SOLN
15.0000 mL | Freq: Two times a day (BID) | OROMUCOSAL | Status: DC
  Administered 2021-01-15 – 2021-01-17 (×5): 15 mL via OROMUCOSAL
  Filled 2021-01-15 (×3): qty 15

## 2021-01-15 MED ORDER — KCL IN DEXTROSE-NACL 40-5-0.45 MEQ/L-%-% IV SOLN
INTRAVENOUS | Status: DC
  Filled 2021-01-15 (×3): qty 1000

## 2021-01-15 MED ORDER — POTASSIUM CHLORIDE 2 MEQ/ML IV SOLN
INTRAVENOUS | Status: DC
  Filled 2021-01-15: qty 1000

## 2021-01-15 MED ORDER — MAGNESIUM SULFATE 4 GM/100ML IV SOLN
4.0000 g | Freq: Once | INTRAVENOUS | Status: AC
  Administered 2021-01-15: 4 g via INTRAVENOUS
  Filled 2021-01-15: qty 100

## 2021-01-15 MED ORDER — ORAL CARE MOUTH RINSE
15.0000 mL | Freq: Two times a day (BID) | OROMUCOSAL | Status: DC
  Administered 2021-01-15 – 2021-01-16 (×4): 15 mL via OROMUCOSAL

## 2021-01-15 MED ORDER — INSULIN ASPART 100 UNIT/ML IJ SOLN
0.0000 [IU] | INTRAMUSCULAR | Status: DC
  Administered 2021-01-15 (×2): 2 [IU] via SUBCUTANEOUS
  Administered 2021-01-16: 1 [IU] via SUBCUTANEOUS

## 2021-01-15 NOTE — Progress Notes (Signed)
NAME:  Mercedes Webb, MRN:  [id number], DOB:  06-22-1955, LOS: 2 ADMISSION DATE:  01/13/2021, CONSULTATION DATE:  01/13/21 REFERRING MD:  Kommor (ED), CHIEF COMPLAINT:   AMS, hyperglycemia  History of Present Illness:  Mercedes Webb is a 65 year old woman with a history of DM, HTN, HLD, here with weakness for the past 10 days.  Initially found unresponsive by friend on couch, called EMS.  On presentation to ED was alert and oriented, experiencing pain diffusely.   Complaint of abdominal pain. Poor memory right now.   Initially hypotensive, hypthermic and tachypneic.   BP improved somewhat with fluids.   IN ED: LR, 2.5L, Insulin Gtt  Started semaglutide on 9/12 '3mg'$  daily.  Unknown if she has been compliant.    BS unreadable (>600)  Initial PH 6.9 at 549pm  Initial BS on Chem panel 1374, AG 28.   WBC 25  Pertinent  Medical History  HLD  HTN DM Squamous cell cancer, skin  Endometriosis, hx of hysterectomy TAH/BSO Anxiety   Meds: lipitor 10, Ambiet 12.5, metformin 1000BID, , lovaza 1gm BID, KCL 36mq daily, semagludite'3mg'$  _ '7mg'$  daily, maxide 37.5/25 daily, valtrex 500 daily Venlafaxine 150 daily, vit d  Former smoker, quit 30+ years ago Occasional ETOH.   Significant Hospital Events: Including procedures, antibiotic start and stop dates in addition to other pertinent events   9/17 agitated, requiring Precedex  Interim History / Subjective:  Agitation appears better New fever Concern for sepsis  Objective   Blood pressure (!) 126/104, pulse 66, temperature 97.6 F (36.4 C), temperature source Oral, resp. rate 15, height '5\' 7"'$  (1.702 m), weight 77.4 kg, SpO2 95 %.        Intake/Output Summary (Last 24 hours) at 01/15/2021 1000 Last data filed at 01/15/2021 0936 Gross per 24 hour  Intake 8671.36 ml  Output 2000 ml  Net 6671.36 ml    Filed Weights   01/13/21 1848 01/13/21 2200  Weight: 76.2 kg 77.4 kg    Examination: General: Chronically ill-appearing HENT:  Moist oral mucosa Lungs: Clear breath sounds bilaterally Cardiovascular: S1-S2 appreciated Abdomen: Soft, bowel sounds appreciated Extremities: No edema, some eschar/lesions Neuro: More interactive, moving all extremities  Resolved Hospital Problem list     Assessment & Plan:  DKA -Likely secondary to pancreatitis -Transition off continuous insulin -Continue IV fluids -SSI  Sepsis Staph on blood culture Pancreatitis-elevated lipase -Continue antibiotics -De-escalate antibiotics following cultures -Follow-up on abdominal CT  Altered mental status Not fully explained by DKA Was not on any opiates, no significant alcohol use per family -Obtain CT head  Pancreatitis -Semaglutide may have caused or exacerbated pancreatitis -She does have a past history of pancreatitis -Ultrasound abdomen is unremarkable-visualized portion of pancreas unremarkable  Abnormal EKG -Troponin normal  Acute kidney injury-present on admission -Avoid nephrotoxic's -Maintain renal perfusion -Monitor  Risk of decompensation remains very high  Pain management with morphine Continue Precedex for agitation  Was unable to safely swallow, remains n.p.o. Best Practice (right click and "Reselect all SmartList Selections" daily)   Diet/type: NPO DVT prophylaxis: systemic heparin GI prophylaxis: N/A Lines: N/A Foley:  N/A Code Status:  full code Last date of multidisciplinary goals of care discussion '[]'$   Labs   CBC: Recent Labs  Lab 01/09/21 1206 01/13/21 1749 01/13/21 1804 01/13/21 2040  WBC 7.7 25.1*  --  19.4*  NEUTROABS 6.1 20.5*  --   --   HGB 15.2 14.2 14.6 12.8  HCT 46.5 45.4 43.0 40.1  MCV 92 97.0  --  96.4  PLT 334 469*  --  319     Basic Metabolic Panel: Recent Labs  Lab 01/14/21 0421 01/14/21 0756 01/14/21 1159 01/14/21 2215 01/15/21 0133  NA 135 137 137 141 141  K 3.5 2.5* 2.8* 2.5* 2.9*  CL 100 106 108 114* 112*  CO2 16* 18* 21* 21* 22  GLUCOSE 499* 289* 210*  108* 126*  BUN 32* 29* 27* 19 17  CREATININE 1.86* 1.29* 1.07* 0.84 0.79  CALCIUM 9.1 9.1 8.8* 8.9 8.4*  MG  --   --   --  1.6*  --   PHOS  --   --   --  1.3*  --     GFR: Estimated Creatinine Clearance: 74.1 mL/min (by C-G formula based on SCr of 0.79 mg/dL). Recent Labs  Lab 01/09/21 1206 01/13/21 1749 01/13/21 1749 01/13/21 2040 01/14/21 2215 01/15/21 0133 01/15/21 0424  WBC 7.7 25.1*  --  19.4*  --   --   --   LATICACIDVEN  --  2.8*   < > 2.7* 4.1* 2.3* 3.1*   < > = values in this interval not displayed.     Liver Function Tests: Recent Labs  Lab 01/09/21 1206 01/13/21 1749 01/14/21 2215  AST 23 56* 37  ALT 26 41 26  ALKPHOS 153* 125 77  BILITOT 0.9 2.4* 0.6  PROT 7.8 6.9 5.5*  ALBUMIN 4.5 3.3* 2.6*    Recent Labs  Lab 01/13/21 1749  LIPASE 320*    No results for input(s): AMMONIA in the last 168 hours.  ABG    Component Value Date/Time   HCO3 3.5 (L) 01/13/2021 1749   TCO2 8 (L) 01/13/2021 1804   ACIDBASEDEF 28.8 (H) 01/13/2021 1749   O2SAT 98.1 01/13/2021 1749      Coagulation Profile: Recent Labs  Lab 01/13/21 1749  INR 1.3*     Cardiac Enzymes: No results for input(s): CKTOTAL, CKMB, CKMBINDEX, TROPONINI in the last 168 hours.  HbA1C: Hemoglobin A1C  Date/Time Value Ref Range Status  01/09/2021 12:34 PM 14.0 (A) 4.0 - 5.6 % Final    Comment:    >14  12/31/2019 09:12 AM 5.5 4.0 - 5.6 % Final   Hgb A1c MFr Bld  Date/Time Value Ref Range Status  05/05/2020 10:06 AM 5.9 (H) 4.8 - 5.6 % Final    Comment:             Prediabetes: 5.7 - 6.4          Diabetes: >6.4          Glycemic control for adults with diabetes: <7.0   10/20/2018 09:20 AM 6.7 (H) 4.8 - 5.6 % Final    Comment:             Prediabetes: 5.7 - 6.4          Diabetes: >6.4          Glycemic control for adults with diabetes: <7.0     CBG: Recent Labs  Lab 01/14/21 1827 01/14/21 1928 01/14/21 2041 01/14/21 2329 01/15/21 0325  GLUCAP 157* 155* 148* 114*  167*     Review of Systems:   As described in HPI Review of Systems  Constitutional:  Negative for fever.  HENT:  Negative for hearing loss.   Eyes:  Negative for blurred vision.  Respiratory:  Negative for cough.   Cardiovascular:  Negative for chest pain.  Gastrointestinal:  Positive for abdominal pain. Negative for heartburn.  Genitourinary:  Negative for dysuria.  Musculoskeletal:  Positive for myalgias.  Skin:  Negative for rash.  Neurological:  Negative for dizziness.  Endo/Heme/Allergies:  Negative for polydipsia.  Psychiatric/Behavioral:  Negative for depression.     Past Medical History:  She,  has a past medical history of Anxiety, Cancer ([place]) (2016), Cervical dysplasia, Colon polyps, Diabetes mellitus without complication ([place]), Endometriosis, Family history of adverse reaction to anesthesia, Headache, Hypertension, Insomnia, Pancreatitis (01/2016), and Recurrent cold sores.   Surgical History:   Past Surgical History:  Procedure Laterality Date   ABDOMINAL HYSTERECTOMY     tah/bso- endometriosis   CHOLECYSTECTOMY     ERCP N/A 02/03/2016   Procedure: ENDOSCOPIC RETROGRADE CHOLANGIOPANCREATOGRAPHY (ERCP);  Surgeon: Carol Ada, MD;  Location: [facility] ENDOSCOPY;  Service: Endoscopy;  Laterality: N/A;   KNEE ARTHROSCOPY     tonsillectomy     TONSILLECTOMY     TUBAL LIGATION       Social History:   reports that she quit smoking about 32 years ago. Her smoking use included cigarettes. She has never used smokeless tobacco. She reports current alcohol use. She reports that she does not use drugs.   Family History:  Her family history includes Breast cancer (age of onset: 81) in her sister; Diabetes in her brother and father; Heart disease in her father. There is no history of Colon cancer or Ovarian cancer.   Allergies Allergies  Allergen Reactions   Prozac [Fluoxetine Hcl] Other (See Comments)    sweating   The patient is critically ill with multiple organ systems  failure and requires high complexity decision making for assessment and support, frequent evaluation and titration of therapies, application of advanced monitoring technologies and extensive interpretation of multiple databases. Critical Care Time devoted to patient care services described in this note independent of APP/resident time (if applicable)  is 32 minutes.   Sherrilyn Rist MD [place] [facility] Personal pager: See Amion If unanswered, please page CCM On-call: [phone]

## 2021-01-15 NOTE — Progress Notes (Signed)
SLP Cancellation Note  Patient Details Name: Mercedes Webb MRN: [id number] DOB: Nov 22, 1955   Cancelled treatment:       Reason Eval/Treat Not Completed: Patient's level of consciousness;Other (comment) (Spoke with RN who stated patient was coughing even during oral care. SLP to check next date for patient readiness for BSE)   Sonia Baller, MA, CCC-SLP Speech Therapy

## 2021-01-15 NOTE — Progress Notes (Signed)
eLink Physician-Brief Progress Note Patient Name: Mercedes Webb DOB: 01/06/1956 MRN: [id number]   Date of Service  01/15/2021  HPI/Events of Note  RN asking for restraints renewal. Agitation. Not used posey , today  Camera eval done.   eICU Interventions  Bilateral, non violent soft wrist restriants ordered to prevent self injury and harm from pullineg lines/tubes.     Intervention Category Minor Interventions: Agitation / anxiety - evaluation and management  Elmer Sow 01/15/2021, 9:18 PM

## 2021-01-15 NOTE — Progress Notes (Signed)
PHARMACY - PHYSICIAN COMMUNICATION CRITICAL VALUE ALERT - BLOOD CULTURE IDENTIFICATION (BCID)  Mercedes Webb is an 65 y.o. female who presented to [facility] on 01/13/2021 after she was found unresponsive by a friend.  She was found to be in DKA.  She was started on vancomycin and cefepime over night for suspected sepsis.   Name of physician (or Provider) Contacted: Dr. Ander Slade  Current antibiotics: vancomycin and cefepime  Changes to prescribed antibiotics recommended:  - per Dr. Ander Slade, continue vancomycin and cefepime for now  Results for orders placed or performed during the hospital encounter of 01/13/21  Blood Culture ID Panel (Reflexed) (Collected: 01/13/2021  5:41 PM)  Result Value Ref Range   Enterococcus faecalis NOT DETECTED NOT DETECTED   Enterococcus Faecium NOT DETECTED NOT DETECTED   Listeria monocytogenes NOT DETECTED NOT DETECTED   Staphylococcus species DETECTED (A) NOT DETECTED   Staphylococcus aureus (BCID) NOT DETECTED NOT DETECTED   Staphylococcus epidermidis NOT DETECTED NOT DETECTED   Staphylococcus lugdunensis NOT DETECTED NOT DETECTED   Streptococcus species NOT DETECTED NOT DETECTED   Streptococcus agalactiae NOT DETECTED NOT DETECTED   Streptococcus pneumoniae NOT DETECTED NOT DETECTED   Streptococcus pyogenes NOT DETECTED NOT DETECTED   A.calcoaceticus-baumannii NOT DETECTED NOT DETECTED   Bacteroides fragilis NOT DETECTED NOT DETECTED   Enterobacterales NOT DETECTED NOT DETECTED   Enterobacter cloacae complex NOT DETECTED NOT DETECTED   Escherichia coli NOT DETECTED NOT DETECTED   Klebsiella aerogenes NOT DETECTED NOT DETECTED   Klebsiella oxytoca NOT DETECTED NOT DETECTED   Klebsiella pneumoniae NOT DETECTED NOT DETECTED   Proteus species NOT DETECTED NOT DETECTED   Salmonella species NOT DETECTED NOT DETECTED   Serratia marcescens NOT DETECTED NOT DETECTED   Haemophilus influenzae NOT DETECTED NOT DETECTED   Neisseria meningitidis NOT  DETECTED NOT DETECTED   Pseudomonas aeruginosa NOT DETECTED NOT DETECTED   Stenotrophomonas maltophilia NOT DETECTED NOT DETECTED   Candida albicans NOT DETECTED NOT DETECTED   Candida auris NOT DETECTED NOT DETECTED   Candida glabrata NOT DETECTED NOT DETECTED   Candida krusei NOT DETECTED NOT DETECTED   Candida parapsilosis NOT DETECTED NOT DETECTED   Candida tropicalis NOT DETECTED NOT DETECTED   Cryptococcus neoformans/gattii NOT DETECTED NOT DETECTED    Lynelle Doctor 01/15/2021  9:13 AM

## 2021-01-15 NOTE — Progress Notes (Deleted)
Pt is A&O X 4. Restraints removed while daughter is at bedside. Patient displays poor attention/concentration and quickly becomes restless/agitated and begins pulling at IV lines and trying to get out of bed. Will continue to assess need for restraint use.

## 2021-01-15 NOTE — Progress Notes (Signed)
Inpatient Diabetes Program Recommendations  AACE/ADA: New Consensus Statement on Inpatient Glycemic Control (2015)  Target Ranges:  Prepandial:   less than 140 mg/dL      Peak postprandial:   less than 180 mg/dL (1-2 hours)      Critically ill patients:  140 - 180 mg/dL   Lab Results  Component Value Date   GLUCAP 132 (H) 01/15/2021   HGBA1C 14.0 (A) 01/09/2021    Review of Glycemic Control  Diabetes history: DM 2 Of note patient's A1C has gone from 5.9 to >14 in less than a year Outpatient Diabetes medications:  Ryblesus 3 mg daily, Metformin 1000 mg bid Current orders for Inpatient glycemic control:  Novolog moderate q 4 hours Levemir 23 units daily  Inpatient Diabetes Program Recommendations:    Please reduce Novolog to sensitive q 4 hours.  Also please reduce Levemir to 18 units daily.  Diabetes coordinator will f/u with patient regarding DM management on 01/16/21.   Thanks,  Adah Perl, RN, BC-ADM Inpatient Diabetes Coordinator Pager [phone]

## 2021-01-15 NOTE — Progress Notes (Signed)
Patient chest xray showed possible pneumonia per provider. I put the patient in high fowlers to give her water which she was unable to drinking without choking almost immediately. Patient mentation remains diminished for now due to illness, she is unable to follow most basic commands easily. I will make NPO for now and put in an order for speech therapy to see the patient.

## 2021-01-15 NOTE — Progress Notes (Signed)
Date and time results received: 01/14/2021 22:18   Test: Lactic Acid  Critical Value: 4.1  Name of Provider Notified: E-Link   Orders Received? Or Actions Taken?:  Antibiotics started.  Renae Gloss 01/15/2021

## 2021-01-15 NOTE — Progress Notes (Signed)
Secure chat to Dr Ander Slade and nursing staff re PICC order with positive blood cultures and febrile overnight.  Recommendations are to place CVC if needed and reorder PICC once repeat [facility] are negative x 48- 72 hours.  Verbal order given to cancel PICC placement.

## 2021-01-16 DIAGNOSIS — E1110 Type 2 diabetes mellitus with ketoacidosis without coma: Secondary | ICD-10-CM | POA: Diagnosis not present

## 2021-01-16 DIAGNOSIS — K859 Acute pancreatitis without necrosis or infection, unspecified: Secondary | ICD-10-CM | POA: Diagnosis not present

## 2021-01-16 LAB — GLUCOSE, CAPILLARY
Glucose-Capillary: 133 mg/dL — ABNORMAL HIGH (ref 70–99)
Glucose-Capillary: 113 mg/dL — ABNORMAL HIGH (ref 70–99)
Glucose-Capillary: 90 mg/dL (ref 70–99)
Glucose-Capillary: 85 mg/dL (ref 70–99)
Glucose-Capillary: 79 mg/dL (ref 70–99)
Glucose-Capillary: 119 mg/dL — ABNORMAL HIGH (ref 70–99)
Glucose-Capillary: 124 mg/dL — ABNORMAL HIGH (ref 70–99)

## 2021-01-16 LAB — PROCALCITONIN: Procalcitonin: 0.3 ng/mL

## 2021-01-16 LAB — LIPASE, BLOOD: Lipase: 39 U/L (ref 11–51)

## 2021-01-16 LAB — BASIC METABOLIC PANEL
Anion gap: 5 (ref 5–15)
BUN: 18 mg/dL (ref 8–23)
CO2: 21 mmol/L — ABNORMAL LOW (ref 22–32)
Calcium: 7.9 mg/dL — ABNORMAL LOW (ref 8.9–10.3)
Chloride: 111 mmol/L (ref 98–111)
Creatinine, Ser: 0.68 mg/dL (ref 0.44–1.00)
GFR, Estimated: >60 mL/min (ref >60)
Glucose, Bld: 132 mg/dL — ABNORMAL HIGH (ref 70–99)
Potassium: 3 mmol/L — ABNORMAL LOW (ref 3.5–5.1)
Sodium: 137 mmol/L (ref 135–145)

## 2021-01-16 LAB — HEMOGLOBIN A1C
Hgb A1c MFr Bld: 13.6 % — ABNORMAL HIGH (ref 4.8–5.6)
Mean Plasma Glucose: 343.62 mg/dL

## 2021-01-16 MED ORDER — POTASSIUM CHLORIDE 10 MEQ/100ML IV SOLN
10.0000 meq | INTRAVENOUS | Status: AC
  Administered 2021-01-16 (×6): 10 meq via INTRAVENOUS
  Filled 2021-01-16 (×6): qty 100

## 2021-01-16 MED ORDER — INSULIN ASPART 100 UNIT/ML IJ SOLN
0.0000 [IU] | Freq: Three times a day (TID) | INTRAMUSCULAR | Status: DC
  Administered 2021-01-17: 3 [IU] via SUBCUTANEOUS
  Administered 2021-01-17: 5 [IU] via SUBCUTANEOUS
  Administered 2021-01-17: 3 [IU] via SUBCUTANEOUS
  Administered 2021-01-18: 5 [IU] via SUBCUTANEOUS
  Administered 2021-01-18: 2 [IU] via SUBCUTANEOUS

## 2021-01-16 MED ORDER — INSULIN DETEMIR 100 UNIT/ML ~~LOC~~ SOLN
9.0000 [IU] | Freq: Every day | SUBCUTANEOUS | Status: DC
  Administered 2021-01-16 – 2021-01-18 (×3): 9 [IU] via SUBCUTANEOUS
  Filled 2021-01-16 (×3): qty 0.09

## 2021-01-16 NOTE — Progress Notes (Signed)
Inpatient Diabetes Program Recommendations  AACE/ADA: New Consensus Statement on Inpatient Glycemic Control (2015)  Target Ranges:  Prepandial:   less than 140 mg/dL      Peak postprandial:   less than 180 mg/dL (1-2 hours)      Critically ill patients:  140 - 180 mg/dL   Lab Results  Component Value Date   GLUCAP 119 (H) 01/16/2021   HGBA1C 13.6 (H) 01/13/2021    Review of Glycemic Control  Diabetes history: DM2 Outpatient Diabetes medications: Rybelsus 3 mg (Pt not taking), metformin 1000 mg BID Current orders for Inpatient glycemic control: Levemir 9 units QD, Novolog 0-9 units TID with meals  HgbA1C - 13.6% CBGs 90, 85, 79, 119 mg/dL On FL diet  Inpatient Diabetes Program Recommendations:    Agree with orders.  Spoke with patient about new diabetes diagnosis.  Discussed A1C results (13.6%) and explained what an A1C is and informed patient that his current A1C indicates an average glucose of 344 mg/dl over the past 2-3 months. Discussed basic pathophysiology of DM Type 2, basic home care, importance of checking CBGs and maintaining good CBG control to prevent long-term and short-term complications. Reviewed glucose and A1C goals and explained that patient will need to continue to monitor blood sugars and take logbook to PCP for review. Discussed signs and symptoms of hyperglycemia and hypoglycemia along with treatment for both. Discussed impact of nutrition, exercise, stress, sickness, and medications on diabetes control. Reviewed Living Well with diabetes booklet and encouraged patient to read through entire book.  Asked patient to check his glucose 4 times per day (before meals and at bedtime) and to keep a log book of glucose readings and insulin taken. Explained how the doctor he follows up with can use the log book to continue to make insulin adjustments if needed. Reviewed and demonstrated how to draw up and administer insulin with vial and syringe. Patient was able to  successfully demonstrate how to draw up and administer insulin with vial and syringe. Informed patient that RN will be asking him to self-administer insulin to ensure proper technique and ability to administer self insulin shots.   Patient verbalized understanding of information discussed and he states that he has no further questions at this time related to diabetes.   RNs to provide ongoing basic DM education at bedside with this patient and engage patient to actively check blood glucose and administer insulin injections.  Pt states she will need affordable insulin. Demonstrated insulin pen use and will f/u in am with insulin pen starter kit.  Consider Humulin 70/30 ReliOn insulin pen ($44 at [facility]) Will check 9/20 FBS and make recs for insulin at home. Discussed visit with RN.  Thank you. Lorenda Peck, RD, LDN, CDE Inpatient Diabetes Coordinator [phone]

## 2021-01-16 NOTE — Evaluation (Signed)
Clinical/Bedside Swallow Evaluation Patient Details  Name: Mercedes Webb MRN: [id number] Date of Birth: 11/10/1955  Today's Date: 01/16/2021 Time: SLP Start Time (ACUTE ONLY): 4132 SLP Stop Time (ACUTE ONLY): 0945 SLP Time Calculation (min) (ACUTE ONLY): 20 min  Past Medical History:  Past Medical History:  Diagnosis Date   Anxiety    Cancer ([place]) 2016   Skin CA squamous cell on bilateral feet removed   Cervical dysplasia    h/o   Colon polyps    polyps on first scope ~ 2006, no recurrent polyps ~ 2001 and in 2016.    Diabetes mellitus without complication ([place])    Endometriosis    Family history of adverse reaction to anesthesia    Mother had difficulty waking & nausea   Headache    Hypertension    Insomnia    Pancreatitis 01/2016   Recurrent cold sores    Past Surgical History:  Past Surgical History:  Procedure Laterality Date   ABDOMINAL HYSTERECTOMY     tah/bso- endometriosis   CHOLECYSTECTOMY     ERCP N/A 02/03/2016   Procedure: ENDOSCOPIC RETROGRADE CHOLANGIOPANCREATOGRAPHY (ERCP);  Surgeon: Carol Ada, MD;  Location: [facility] ENDOSCOPY;  Service: Endoscopy;  Laterality: N/A;   KNEE ARTHROSCOPY     tonsillectomy     TONSILLECTOMY     TUBAL LIGATION     HPI:  Patient is a 65 y.o. female with PMH: DM, HTN, HLD, anxiety, squamous cell cancer of skin, h/o pancreatitis, 10 day history of weakness, who presented to the hospital on 9/16 after being found unresponsive on couch by a friend. In ED, patient was alert and oriented, experiencing diffuse pain and specifically abdominal pain when evaluated by MD. Initially she was hypotensive, hypothermic and tachypneic. BP improved somewhat with fluids. She was agitated on 9/17, requiring Precedex.    Assessment / Plan / Recommendation  Clinical Impression  Patient presents with a suspected primary cognitive based dysphagia which is mild overall in severity. During initial straw sip of thin liquds (water), patient appeared with  some discoordination in swallow initiation but she reported she didnt know SLP wanted her to swallow the water as she had just finished oral care with nurse and had been asked to swish and spit. Subsequent swallows all appeared WFL-WNL and without overt s/s aspiration or penetration. Patient declined most solids but did accept ice chips and small bite of gelatin. Mastication of both appeared timely and efficient. SLP is recommending to start patient on full liquids diet with expectation of advancing solids textures soon and without suspected difficulty. SLP to follow briefly for toleration of diet and ability to upgrade with solid textures. SLP Visit Diagnosis: Dysphagia, unspecified (R13.10)    Aspiration Risk  Mild aspiration risk;No limitations    Diet Recommendation Other (Comment);Thin liquid (Initiate full liquids diet as per patient preference but patient should be able to tolerate upgraded solids without significant difficulty)   Liquid Administration via: Cup;Straw Medication Administration: Whole meds with liquid Supervision: Patient able to self feed Compensations: Minimize environmental distractions;Slow rate;Small sips/bites Postural Changes: Seated upright at 90 degrees    Other  Recommendations Oral Care Recommendations: Oral care BID    Recommendations for follow up therapy are one component of a multi-disciplinary discharge planning process, led by the attending physician.  Recommendations may be updated based on patient status, additional functional criteria and insurance authorization.  Follow up Recommendations None      Frequency and Duration    1 week  Prognosis Prognosis for Safe Diet Advancement: Good      Swallow Study   General Date of Onset: 01/16/21 HPI: Patient is a 65 y.o. female with PMH: DM, HTN, HLD, anxiety, squamous cell cancer of skin, h/o pancreatitis, 10 day history of weakness, who presented to the hospital on 9/16 after being found  unresponsive on couch by a friend. In ED, patient was alert and oriented, experiencing diffuse pain and specifically abdominal pain when evaluated by MD. Initially she was hypotensive, hypothermic and tachypneic. BP improved somewhat with fluids. She was agitated on 9/17, requiring Precedex. Type of Study: Bedside Swallow Evaluation Previous Swallow Assessment: None found Diet Prior to this Study: NPO Temperature Spikes Noted: No Respiratory Status: Room air History of Recent Intubation: No Behavior/Cognition: Alert;Cooperative;Confused Oral Cavity Assessment: Dried secretions;Dry Oral Care Completed by SLP: Yes Oral Cavity - Dentition: Adequate natural dentition Vision: Functional for self-feeding Self-Feeding Abilities: Able to feed self Patient Positioning: Upright in bed Baseline Vocal Quality: Normal Volitional Cough: Strong Volitional Swallow: Able to elicit    Oral/Motor/Sensory Function Overall Oral Motor/Sensory Function: Within functional limits   Ice Chips     Thin Liquid Thin Liquid: Impaired Presentation: Straw;Self Fed Oral Phase Impairments: Poor awareness of bolus Pharyngeal  Phase Impairments: Other (comments) Other Comments: Initially, patient presented with a discoordinated swallow however she reported she didnt realize SLP wanted her to swallow the water (she had just finished oral care with nursing and was asked to swish and spit). Subsequent swallows appeared [facility].    Nectar Thick     Honey Thick     Puree Puree: Within functional limits   Solid     Solid: Within functional limits Other Comments: Ice chips and gelatin used as patient declining any other solids at this time     Sonia Baller, MA, CCC-SLP Speech Therapy

## 2021-01-16 NOTE — Progress Notes (Signed)
AM K+ 3.0 with creat 0.68 and GFR >60. Elink CCM electrolyte protocol initiated.

## 2021-01-16 NOTE — Progress Notes (Signed)
PHARMACY - PHYSICIAN COMMUNICATION CRITICAL VALUE ALERT - BLOOD CULTURE IDENTIFICATION (BCID)  Mercedes Webb is an 65 y.o. female who presented to [facility] on 01/13/2021 after she was found unresponsive by a friend.  She was found to be in DKA.  She was started on vancomycin and cefepime 9/17 for suspected sepsis.  9/18 BCID called with 1 of 2 bottles (single BCx 9/16) with Staph sp in Anaerobic bottle only 9/19 BCID called with 1 of 2 bottles from same with GPR in Aerobic bottle only   Name of physician (or Provider) Contacted: Dr. Gala Murdoch, Marni Griffon  Current antibiotics: vancomycin and cefepime  Changes to prescribed antibiotics recommended:  - 9/18 per Dr. Ander Slade, continue vancomycin and cefepime for now - 9/19 consider contaminant, no change  Results for orders placed or performed during the hospital encounter of 01/13/21  Blood Culture ID Panel (Reflexed) (Collected: 01/13/2021  5:41 PM)  Result Value Ref Range   Enterococcus faecalis NOT DETECTED NOT DETECTED   Enterococcus Faecium NOT DETECTED NOT DETECTED   Listeria monocytogenes NOT DETECTED NOT DETECTED   Staphylococcus species DETECTED (A) NOT DETECTED   Staphylococcus aureus (BCID) NOT DETECTED NOT DETECTED   Staphylococcus epidermidis NOT DETECTED NOT DETECTED   Staphylococcus lugdunensis NOT DETECTED NOT DETECTED   Streptococcus species NOT DETECTED NOT DETECTED   Streptococcus agalactiae NOT DETECTED NOT DETECTED   Streptococcus pneumoniae NOT DETECTED NOT DETECTED   Streptococcus pyogenes NOT DETECTED NOT DETECTED   A.calcoaceticus-baumannii NOT DETECTED NOT DETECTED   Bacteroides fragilis NOT DETECTED NOT DETECTED   Enterobacterales NOT DETECTED NOT DETECTED   Enterobacter cloacae complex NOT DETECTED NOT DETECTED   Escherichia coli NOT DETECTED NOT DETECTED   Klebsiella aerogenes NOT DETECTED NOT DETECTED   Klebsiella oxytoca NOT DETECTED NOT DETECTED   Klebsiella pneumoniae NOT DETECTED NOT DETECTED    Proteus species NOT DETECTED NOT DETECTED   Salmonella species NOT DETECTED NOT DETECTED   Serratia marcescens NOT DETECTED NOT DETECTED   Haemophilus influenzae NOT DETECTED NOT DETECTED   Neisseria meningitidis NOT DETECTED NOT DETECTED   Pseudomonas aeruginosa NOT DETECTED NOT DETECTED   Stenotrophomonas maltophilia NOT DETECTED NOT DETECTED   Candida albicans NOT DETECTED NOT DETECTED   Candida auris NOT DETECTED NOT DETECTED   Candida glabrata NOT DETECTED NOT DETECTED   Candida krusei NOT DETECTED NOT DETECTED   Candida parapsilosis NOT DETECTED NOT DETECTED   Candida tropicalis NOT DETECTED NOT DETECTED   Cryptococcus neoformans/gattii NOT DETECTED NOT DETECTED    Minda Ditto PharmD 01/16/2021  7:49 AM

## 2021-01-16 NOTE — Progress Notes (Addendum)
NAME:  ARLA FRAILEY, MRN:  [id number], DOB:  07-22-1955, LOS: 3 ADMISSION DATE:  01/13/2021, CONSULTATION DATE:  01/13/21 REFERRING MD:  Kommor (ED), CHIEF COMPLAINT:   AMS, hyperglycemia  History of Present Illness:  Ms. Higham is a 65 year old woman with a history of DM, HTN, HLD, here with weakness for the past 10 days.  Initially found unresponsive by friend on couch, called EMS.  On presentation to ED was alert and oriented, experiencing pain diffusely.   Complaint of abdominal pain. Poor memory right now.   Initially hypotensive, hypthermic and tachypneic.   BP improved somewhat with fluids.    Pertinent  Medical History  HLD, HTN, DM, Squamous cell cancer, skin, Endometriosis, hx of hysterectomy TAH/BSO, Anxiety   Former smoker, quit 30+ years ago Occasional ETOH.   Significant Hospital Events: Including procedures, antibiotic start and stop dates in addition to other pertinent events   9/17 agitated, requiring Precedex DKA protocol. Cultures sent. Cefepime and vanc started. BC x 1 w/ staph 9/18  CT abd/pelvis sm amount of bowel thickening at distal ileum, small pleural effusions, ansarca, hepatic steatosis. CT brain neg 9/19 MS improving. Weaning precedex. BC repeated. Starting diet   Interim History / Subjective:  Feeling better  Objective   Blood pressure 104/67, pulse 65, temperature (Abnormal) 97.3 F (36.3 C), temperature source Axillary, resp. rate (Abnormal) 22, height '5\' 7"'$  (1.702 m), weight 77.4 kg, SpO2 100 %.        Intake/Output Summary (Last 24 hours) at 01/16/2021 0913 Last data filed at 01/16/2021 [id number] Gross per 24 hour  Intake 3359.29 ml  Output 1000 ml  Net 2359.29 ml    Filed Weights   01/13/21 1848 01/13/21 2200  Weight: 76.2 kg 77.4 kg    Examination: General: 65 year old female female patient lying in bed she is currently in no acute distress HEENT normocephalic atraumatic her mucous membranes are still dry there is no neck vein distention  sclera nonicteric Pulmonary: Clear to auscultation without accessory use Cardiac: Regular rate and rhythm without murmur rub or gallop Abdomen: Soft not tender no organomegaly Extremities: Generalized anasarca noted.  Pulses are palpable. Neuro: Awake oriented x2.  Easily redirectable, moving all extremities no focal deficits appreciated. GU: Clear yellow has a Foley catheter  Resolved Hospital Problem list    Abnormal EKG->Troponin normal  Assessment & Plan:  DKA-Likely secondary to pancreatitis. Now w/ decent glycemic control  Plan She has been transitioned to sliding scale insulin, basal insulin started Continue Levemir at 9 units daily, with sliding scale before meals and at bedtime Continue to hold metformin Will not resume Rybelsus  Pancreatitis-elevated lipase -Semaglutide may have caused or exacerbated pancreatitis -She does have a past history of pancreatitis. No radiographic evidence of pancreatitis on CT imaging Plan Slow adv diet Lipase level   SIRS vs sepsis. Had 1/2 staph in blood, also had mild colitis  Plan Repeating blood cultures continue current antibiotic regimen for now  Fluid and Electrolyte imbalance: hypokalemia,  Plan Replace and recheck  Acute metabolic encephalopathy  Plan Resume her Effexor  Wean Precedex to off  History of hypertension Plan Continuing to hold her Maxide and Cozaar  History of hyperlipidemia Plan Okay to resume Lipitor     Best Practice (right click and "Reselect all SmartList Selections" daily)   Diet/type: NPO DVT prophylaxis: systemic heparin GI prophylaxis: N/A Lines: N/A Foley:  N/A Code Status:  full code Last date of multidisciplinary goals of care discussion '[]'$   Attending Note:  I have examined patient, reviewed labs, studies and notes.   65 year old woman with a history of diabetes, hyperlipidemia, hypertension who was admitted 9/16 with profound metabolic disarray consistent with DKA  associated with acute pancreatitis, acute encephalopathy with some agitated delirium.  Evaluation also included some possible evidence for colitis on imaging, blood culture 1 of 2 GPC of unclear clinical significance.  He has been able to wean off of insulin infusion, now on sliding scale insulin.  Remains on Precedex 0.6.  SLP evaluation from this morning with adequate oropharyngeal function, cleared for full liquid diet.   Vitals:   01/16/21 0700 01/16/21 0800 01/16/21 0900 01/16/21 1000  BP: (!) 99/52 104/67 113/74 105/65  Pulse: (!) 55 65 64 62  Resp: 16 (!) 22 (!) 22 18  Temp:  (!) 97.3 F (36.3 C)    TempSrc:  Axillary    SpO2: 100% 100% 100% 99%  Weight:      Height:      Comfortable laying in bed, no distress.  Oropharynx is dry and she complains of being thirsty.  No stridor.  No secretions.  She is awake, will answer questions, follows commands, oriented to self, place, not the date.  Her lungs are clear bilaterally without any wheezes or crackles.  Heart regular without a murmur.  Abdomen is nondistended, nontender with positive bowel sounds.  1-2+ pretibial edema.  DKA, associated with acute pancreatitis, unclear cause.  Improved.  Now tolerating sliding-scale insulin with Levemir 9 units.  She had been on metformin, semaglutide as an outpatient.  May be able to add back to metformin when she is taking adequate p.o. but would defer the semaglutide which can be associated w pancreatitis.   1 of 2 GPC positive.  Await speciation.  Question contaminant.  We repeated blood cultures today, will continue vancomycin and cefepime for now, hopefully narrow soon.  Multifactorial acute toxic metabolic encephalopathy with some agitated delirium.  Restarted her Effexor.  Goal to wean Precedex off quickly on 9/19  Hypertension.  Her home medications are currently on hold, add back as able and as she is able to tolerate p.o.  Hyperlipidemia.  Adding back her Lipitor on 9/19.  Discussed plans  with the patient and husband at bedside on 9/19  Independent critical care time is 31 minutes.   Baltazar Apo, MD, PhD 01/16/2021, 10:46 AM [facility] [phone] or if no answer [phone]

## 2021-01-16 NOTE — TOC Initial Note (Signed)
Transition of Care [facility]) - Initial/Assessment Note    Patient Details  Name: Mercedes Webb MRN: [id number] Date of Birth: 28-Feb-1956  Transition of [facility]) CM/SW Contact:    Leeroy Cha, RN Phone Number: 01/16/2021, 9:10 AM  Clinical Narrative:                 65 year old woman with a history of DM, HTN, HLD, here with weakness for the past 10 days.  Initially found unresponsive by friend on couch, called EMS.  On presentation to ED was alert and oriented, experiencing pain diffusely.   Complaint of abdominal pain. Poor memory right now.   Initially hypotensive, hypthermic and tachypneic.   BP improved somewhat with fluids.    IN ED: LR, 2.5L, Insulin Gtt   Started semaglutide on 9/12 '3mg'$  daily.  Unknown if she has been compliant.     BS unreadable (>600)  Initial PH 6.9 at 549pm  Initial BS on Chem panel 1374, AG 28.   WBC 25  bld culture + GPC, 3.0 Iv maxipime, vanco, iv precedex and leovophed. TOC PAN OF CAREL To return to home with self care.    Expected Discharge Plan: Home/Self Care Barriers to Discharge: Continued Medical Work up   Patient Goals and CMS Choice Patient states their goals for this hospitalization and ongoing recovery are:: to go home CMS Medicare.gov Compare Post Acute Care list provided to:: Patient Choice offered to / list presented to : Patient  Expected Discharge Plan and Services Expected Discharge Plan: Home/Self Care   Discharge Planning Services: CM Consult   Living arrangements for the past 2 months: Single Family Home                                      Prior Living Arrangements/Services Living arrangements for the past 2 months: Single Family Home Lives with:: Self Patient language and need for interpreter reviewed:: Yes Do you feel safe going back to the place where you live?: Yes            Criminal Activity/Legal Involvement Pertinent to Current Situation/Hospitalization: No - Comment as  needed  Activities of Daily Living Home Assistive Devices/Equipment: None ADL Screening (condition at time of admission) Patient's cognitive ability adequate to safely complete daily activities?: No Is the patient deaf or have difficulty hearing?: No Does the patient have difficulty seeing, even when wearing glasses/contacts?: No Does the patient have difficulty concentrating, remembering, or making decisions?: Yes Patient able to express need for assistance with ADLs?: Yes Does the patient have difficulty dressing or bathing?: No Independently performs ADLs?: Yes (appropriate for developmental age) Does the patient have difficulty walking or climbing stairs?: No Weakness of Legs: None Weakness of Arms/Hands: None  Permission Sought/Granted                  Emotional Assessment Appearance:: Appears stated age     Orientation: : Fluctuating Orientation (Suspected and/or reported Sundowners) Alcohol / Substance Use: Not Applicable Psych Involvement: No (comment)  Admission diagnosis:  DKA (diabetic ketoacidosis) ([place]) [E11.10] Diabetic ketoacidosis without coma associated with other specified diabetes mellitus ([place]) [E13.10] Patient Active Problem List   Diagnosis Date Noted   DKA (diabetic ketoacidosis) ([place]) 01/13/2021   Dysuria 08/01/2020   Urinary tract infection without hematuria 08/01/2020   Hematuria 08/01/2020   Microalbuminuria due to type 2 diabetes mellitus ([place]) 06/17/2019   Mixed  diabetic hyperlipidemia associated with type 2 diabetes mellitus ([place]) 10/23/2018   Diabetes mellitus ([place]) 10/23/2018   Secondary hyperhidrosis- not focal in nature but primarily from waist up is the worst 10/23/2018   Acute stress reaction 08/20/2018   Excess, secretion, sweat 08/20/2018   Low serum progesterone 05/30/2018   Mood disorder ([place])- mixed anxiety and depression 01/20/2018   Panic attack 01/20/2018   Excessive sweating- likely due to hormonal 11/11/2017   Inactivity  11/11/2017   Elevated serum creatinine 09/04/2017   Elevated ALT measurement 09/04/2017   Obesity, Class I, BMI 30-34.9 06/10/2017   Postmenopausal- 30 yrs ago TAH "yrs ago" 11/12/2016   Hormone imbalance- txed by gyn 11/12/2016   Mixed hyperlipidemia 11/12/2016   Primary insomnia 11/12/2016   Adjustment disorder with mixed anxiety and depressed mood 11/12/2016   Elevated cholesterol with elevated triglycerides 09/28/2016   Vitamin D deficiency 09/28/2016   Hypokalemia 02/01/2016   Acute biliary pancreatitis 01/31/2016   Hypertension associated with diabetes ([place]) 01/31/2016   Generalized anxiety disorder 01/31/2016   Overweight 12/23/2014   Surgical menopause 12/23/2014   Status post abdominal hysterectomy 12/23/2014   History of cervical dysplasia 12/23/2014   SUI (stress urinary incontinence, female) 12/23/2014   PCP:  Lorrene Reid, PA-C Pharmacy:   [facility], [place] [address] Phone: [phone] Fax: [phone]  [facility] [id number] - [address] [place]), [place] - [id number] [place] [address] Phone: [phone] Fax: [phone]     Social Determinants of Health (SDOH) Interventions    Readmission Risk Interventions No flowsheet data found.

## 2021-01-17 LAB — CULTURE, BLOOD (SINGLE): Special Requests: ADEQUATE

## 2021-01-17 LAB — GLUCOSE, CAPILLARY
Glucose-Capillary: 192 mg/dL — ABNORMAL HIGH (ref 70–99)
Glucose-Capillary: 204 mg/dL — ABNORMAL HIGH (ref 70–99)
Glucose-Capillary: 255 mg/dL — ABNORMAL HIGH (ref 70–99)
Glucose-Capillary: 238 mg/dL — ABNORMAL HIGH (ref 70–99)
Glucose-Capillary: 244 mg/dL — ABNORMAL HIGH (ref 70–99)

## 2021-01-17 LAB — BASIC METABOLIC PANEL
Anion gap: 10 (ref 5–15)
BUN: 10 mg/dL (ref 8–23)
CO2: 18 mmol/L — ABNORMAL LOW (ref 22–32)
Calcium: 8.9 mg/dL (ref 8.9–10.3)
Chloride: 114 mmol/L — ABNORMAL HIGH (ref 98–111)
Creatinine, Ser: 0.72 mg/dL (ref 0.44–1.00)
GFR, Estimated: >60 mL/min (ref >60)
Glucose, Bld: 190 mg/dL — ABNORMAL HIGH (ref 70–99)
Potassium: 3.7 mmol/L (ref 3.5–5.1)
Sodium: 142 mmol/L (ref 135–145)

## 2021-01-17 LAB — CBC
HCT: 36.3 % (ref 36.0–46.0)
Hemoglobin: 12.5 g/dL (ref 12.0–15.0)
MCH: 30.7 pg (ref 26.0–34.0)
MCHC: 34.4 g/dL (ref 30.0–36.0)
MCV: 89.2 fL (ref 80.0–100.0)
Platelets: DECREASED 10*3/uL (ref 150–400)
RBC: 4.07 MIL/uL (ref 3.87–5.11)
RDW: 16.1 % — ABNORMAL HIGH (ref 11.5–15.5)
WBC: 5.7 10*3/uL (ref 4.0–10.5)
nRBC: 0 % (ref 0.0–0.2)

## 2021-01-17 LAB — PROCALCITONIN: Procalcitonin: 0.23 ng/mL

## 2021-01-17 LAB — PLATELET COUNT: Platelets: 158 10*3/uL (ref 150–400)

## 2021-01-17 MED ORDER — LOSARTAN POTASSIUM 50 MG PO TABS
25.0000 mg | ORAL_TABLET | Freq: Every day | ORAL | Status: DC
  Administered 2021-01-17 – 2021-01-18 (×2): 25 mg via ORAL
  Filled 2021-01-17 (×2): qty 1

## 2021-01-17 MED ORDER — ONDANSETRON HCL 4 MG/2ML IJ SOLN
4.0000 mg | Freq: Four times a day (QID) | INTRAMUSCULAR | Status: DC | PRN
  Administered 2021-01-17: 4 mg via INTRAVENOUS
  Filled 2021-01-17: qty 2

## 2021-01-17 NOTE — Progress Notes (Signed)
PROGRESS NOTE    Mercedes Webb  JKD:[id number] DOB: Feb 25, 1956 DOA: 01/13/2021 PCP: Lorrene Reid, PA-C   Chief Complain: Weakness, unresponsive  Brief Narrative: Patient is a 65 year old female with history of diabetes type 2, hypertension, hyperlipidemia, occasional alcohol use who presented with weakness.  She was also found unresponsive by her friend on couch and called EMS.  On presentation she was alert and oriented, complaining of abdominal pain.  She was hypotensive, hypothermic, tachypneic, confused.  She was admitted under PCCM service.  After admission, hospital course was remarkable for agitation requiring Precedex drip.  She was also found to be in DKA/pancreatitis.  Started on DKA protocol.  Currently she is hemodynamically stable and she has been transferred to our service on 01/17/21.  Assessment & Plan:   Active Problems:   Pancreatitis   DKA (diabetic ketoacidosis) ([place])   DKA: Hemoglobin A1c in the range of 13.  Diabetic coordinator following and recommending 70/30 insulin on DC.  Continue current insulin regimen.  She needs insulin on discharge. She takes metformin, Rybelsus/semaglutide at home.  Acute pancreatitis/abdominal pain: Elevated lipase..  Abdomen CT/pelvis showed a small amount of bowel thickening of distal ileum,. She has history of pancreatitis in the past.  This time is thought to be secondary to semaglutide.  No radiographic evidence of pancreatitis on CT imaging.  Abdomen is benign and she does not have any abdominal pain, nausea or vomiting.  We will advance her diet.  Altered mental status: CT head on presentation was negative for acute intracranial abnormalities.  .  Required Precedex drip on admission for agitation, now weaned off.  Mental status has improved.  She was able to tell the correct day today.  SIRS versus sepsis: Patient is afebrile, no leukocytosis.  Blood cultures showed staph capitis  on anaerobic bottle.  Most likely  contamination.  Currently on broad-spectrum antibiotics, repeat blood cultures have been sent, no growth till date.  We will consider discontinuing antibiotics.  Electrolyte abnormalities: We will continue to monitor and supplement  Hypertension: Home medications on hold.  She was hypertensive on presentation.  Monitor blood pressure.   AKI: Resolved with IV fluids.  Hyperlipidemia: On Lipitor  History of anxiety: On Effexor.  Debility/deconditioning: We will request for PT/OT evaluation.  Patient lives alone.  Ambulates without any problem at baseline  Nutrition Problem: Inadequate oral intake Etiology: acute illness      DVT prophylaxis:Heparin [place] Code Status: Full Family Communication: None at bedside Status is: Inpatient  Remains inpatient appropriate because:Inpatient level of care appropriate due to severity of illness  Dispo: The patient is from: Home              Anticipated d/c is to: Home              Patient currently is not medically stable to d/c.   Difficult to place patient No      Consultants: PCCM  Procedures: None  Antimicrobials:  Anti-infectives (From admission, onward)    Start     Dose/Rate Route Frequency Ordered Stop   01/16/21 1700  vancomycin (VANCOREADY) IVPB 750 mg/150 mL  Status:  Discontinued        750 mg 150 mL/hr over 60 Minutes Intravenous Every 12 hours 01/15/21 0006 01/16/21 1423   01/15/21 0800  ceFEPIme (MAXIPIME) 2 g in sodium chloride 0.9 % 100 mL IVPB  Status:  Discontinued        2 g 200 mL/hr over 30 Minutes Intravenous Every 8 hours  01/15/21 0006 01/16/21 1423   01/14/21 2315  ceFEPIme (MAXIPIME) 2 g in sodium chloride 0.9 % 100 mL IVPB        2 g 200 mL/hr over 30 Minutes Intravenous  Once 01/14/21 2229 01/14/21 2352   01/14/21 2315  vancomycin (VANCOREADY) IVPB 1500 mg/300 mL        1,500 mg 150 mL/hr over 120 Minutes Intravenous  Once 01/14/21 2229 01/15/21 0139       Subjective:  Patient seen and examined  the bedside this morning.  Hemodynamically stable, blood pressure slightly on the higher side.  Looks comfortable.  On room air.  Denies any nausea, vomiting or abdominal pain.  Knows that she is in the hospital, told the correct day.   Objective: Vitals:   01/17/21 0500 01/17/21 0600 01/17/21 0631 01/17/21 0700  BP: (!) 146/59  (!) 145/66 (!) 142/54  Pulse: 97 85  90  Resp: 20 17 (!) 21 (!) 25  Temp:      TempSrc:      SpO2: 100% 99% 100% 100%  Weight:      Height:        Intake/Output Summary (Last 24 hours) at 01/17/2021 0749 Last data filed at 01/17/2021 0700 Gross per 24 hour  Intake 3103.24 ml  Output 6350 ml  Net -3246.76 ml   Filed Weights   01/13/21 1848 01/13/21 2200  Weight: 76.2 kg 77.4 kg    Examination:  General exam: Overall comfortable, not in distress HEENT: PERRL Respiratory system:  no wheezes or crackles  Cardiovascular system: S1 & S2 heard, RRR.  Gastrointestinal system: Abdomen is nondistended, soft and nontender. Central nervous system: Alert and oriented Extremities: No edema, no clubbing ,no cyanosis Skin: No rashes, no ulcers,no icterus     Data Reviewed: I have personally reviewed following labs and imaging studies  CBC: Recent Labs  Lab 01/13/21 1749 01/13/21 1804 01/13/21 2040 01/17/21 0320 01/17/21 0630  WBC 25.1*  --  19.4* 5.7  --   NEUTROABS 20.5*  --   --   --   --   HGB 14.2 14.6 12.8 12.5  --   HCT 45.4 43.0 40.1 36.3  --   MCV 97.0  --  96.4 89.2  --   PLT 469*  --  319 PLATELET CLUMPS NOTED ON SMEAR, COUNT APPEARS DECREASED 671   Basic Metabolic Panel: Recent Labs  Lab 01/14/21 1159 01/14/21 2215 01/15/21 0133 01/16/21 0235 01/17/21 0320  NA 137 141 141 137 142  K 2.8* 2.5* 2.9* 3.0* 3.7  CL 108 114* 112* 111 114*  CO2 21* 21* 22 21* 18*  GLUCOSE 210* 108* 126* 132* 190*  BUN 27* 19 17 18 10   CREATININE 1.07* 0.84 0.79 0.68 0.72  CALCIUM 8.8* 8.9 8.4* 7.9* 8.9  MG  --  1.6*  --   --   --   PHOS  --  1.3*   --   --   --    GFR: Estimated Creatinine Clearance: 74.1 mL/min (by C-G formula based on SCr of 0.72 mg/dL). Liver Function Tests: Recent Labs  Lab 01/13/21 1749 01/14/21 2215  AST 56* 37  ALT 41 26  ALKPHOS 125 77  BILITOT 2.4* 0.6  PROT 6.9 5.5*  ALBUMIN 3.3* 2.6*   Recent Labs  Lab 01/13/21 1749 01/16/21 1036  LIPASE 320* 39   No results for input(s): AMMONIA in the last 168 hours. Coagulation Profile: Recent Labs  Lab 01/13/21 1749  INR 1.3*  Cardiac Enzymes: No results for input(s): CKTOTAL, CKMB, CKMBINDEX, TROPONINI in the last 168 hours. BNP (last 3 results) No results for input(s): PROBNP in the last 8760 hours. HbA1C: No results for input(s): HGBA1C in the last 72 hours. CBG: Recent Labs  Lab 01/16/21 1701 01/16/21 2027 01/16/21 2319 01/17/21 0318 01/17/21 0323  GLUCAP 79 119* 124* 204* 192*   Lipid Profile: No results for input(s): CHOL, HDL, LDLCALC, TRIG, CHOLHDL, LDLDIRECT in the last 72 hours. Thyroid Function Tests: No results for input(s): TSH, T4TOTAL, FREET4, T3FREE, THYROIDAB in the last 72 hours. Anemia Panel: No results for input(s): VITAMINB12, FOLATE, FERRITIN, TIBC, IRON, RETICCTPCT in the last 72 hours. Sepsis Labs: Recent Labs  Lab 01/15/21 0133 01/15/21 0424 01/15/21 1120 01/15/21 1454 01/16/21 1036 01/17/21 0320  PROCALCITON  --   --   --   --  0.30 0.23  LATICACIDVEN 2.3* 3.1* 2.0* 1.2  --   --     Recent Results (from the past 240 hour(s))  Blood culture (routine single)     Status: Abnormal (Preliminary result)   Collection Time: 01/13/21  5:41 PM   Specimen: BLOOD  Result Value Ref Range Status   Specimen Description   Final    BLOOD RIGHT ANTECUBITAL Performed at [facility], [place] [address], [place], [place] [address]    Special Requests   Final    BOTTLES DRAWN AEROBIC AND ANAEROBIC Blood Culture adequate volume Performed at [facility] [address],  [place], [place] [address]    Culture  Setup Time   Final    GRAM POSITIVE COCCI IN CLUSTERS ANAEROBIC BOTTLE ONLY CRITICAL RESULT CALLED TO, READ BACK BY AND VERIFIED WITH: PHARM D M.TUCKER ON [id number] AT 0857 BY E.PARRISH GRAM POSITIVE RODS AEROBIC BOTTLE ONLY CRITICAL RESULT CALLED TO, READ BACK BY AND VERIFIED WITH: PHARMD A PHAN [id number] AT 42AM B Y CM    Culture (A)  Final    STAPHYLOCOCCUS CAPITIS THE SIGNIFICANCE OF ISOLATING THIS ORGANISM FROM A SINGLE VENIPUNCTURE CANNOT BE PREDICTED WITHOUT FURTHER CLINICAL AND CULTURE CORRELATION. SUSCEPTIBILITIES AVAILABLE ONLY ON REQUEST. Performed at [facility], [place] [address] [place], [place] [address]    Report Status PENDING  Incomplete  Blood Culture ID Panel (Reflexed)     Status: Abnormal   Collection Time: 01/13/21  5:41 PM  Result Value Ref Range Status   Enterococcus faecalis NOT DETECTED NOT DETECTED Final   Enterococcus Faecium NOT DETECTED NOT DETECTED Final   Listeria monocytogenes NOT DETECTED NOT DETECTED Final   Staphylococcus species DETECTED (A) NOT DETECTED Final    Comment: CRITICAL RESULT CALLED TO, READ BACK BY AND VERIFIED WITH: PHARM D M.TUCKER ON [id number] AT 0857 BY E.PARRISH    Staphylococcus aureus (BCID) NOT DETECTED NOT DETECTED Final   Staphylococcus epidermidis NOT DETECTED NOT DETECTED Final   Staphylococcus lugdunensis NOT DETECTED NOT DETECTED Final   Streptococcus species NOT DETECTED NOT DETECTED Final   Streptococcus agalactiae NOT DETECTED NOT DETECTED Final   Streptococcus pneumoniae NOT DETECTED NOT DETECTED Final   Streptococcus pyogenes NOT DETECTED NOT DETECTED Final   A.calcoaceticus-baumannii NOT DETECTED NOT DETECTED Final   Bacteroides fragilis NOT DETECTED NOT DETECTED Final   Enterobacterales NOT DETECTED NOT DETECTED Final   Enterobacter cloacae complex NOT DETECTED NOT DETECTED Final   Escherichia coli NOT DETECTED NOT DETECTED Final   Klebsiella aerogenes NOT DETECTED NOT  DETECTED Final   Klebsiella oxytoca NOT DETECTED NOT DETECTED Final   Klebsiella  pneumoniae NOT DETECTED NOT DETECTED Final   Proteus species NOT DETECTED NOT DETECTED Final   Salmonella species NOT DETECTED NOT DETECTED Final   Serratia marcescens NOT DETECTED NOT DETECTED Final   Haemophilus influenzae NOT DETECTED NOT DETECTED Final   Neisseria meningitidis NOT DETECTED NOT DETECTED Final   Pseudomonas aeruginosa NOT DETECTED NOT DETECTED Final   Stenotrophomonas maltophilia NOT DETECTED NOT DETECTED Final   Candida albicans NOT DETECTED NOT DETECTED Final   Candida auris NOT DETECTED NOT DETECTED Final   Candida glabrata NOT DETECTED NOT DETECTED Final   Candida krusei NOT DETECTED NOT DETECTED Final   Candida parapsilosis NOT DETECTED NOT DETECTED Final   Candida tropicalis NOT DETECTED NOT DETECTED Final   Cryptococcus neoformans/gattii NOT DETECTED NOT DETECTED Final    Comment: Performed at [facility], [place] [address], [place], [place] [address]  Urine Culture     Status: None   Collection Time: 01/13/21  8:40 PM   Specimen: In/Out Cath Urine  Result Value Ref Range Status   Specimen Description   Final    IN/OUT CATH URINE Performed at [facility] [address], [place], [place] [address]    Special Requests   Final    NONE Performed at [facility], [place] [address], [place], [place] [address]    Culture   Final    NO GROWTH Performed at [facility], [place] [address] [place], [place] [address]    Report Status 01/15/2021 FINAL  Final  Resp Panel by RT-PCR (Flu A&B, Covid) Nasopharyngeal Swab     Status: None   Collection Time: 01/13/21  9:14 PM   Specimen: Nasopharyngeal Swab; Nasopharyngeal(NP) swabs in vial transport medium  Result Value Ref Range Status   SARS Coronavirus 2 by RT PCR NEGATIVE NEGATIVE Final    Comment: (NOTE) SARS-CoV-2 target nucleic acids are NOT DETECTED.  The SARS-CoV-2 RNA is generally  detectable in upper respiratory specimens during the acute phase of infection. The lowest concentration of SARS-CoV-2 viral copies this assay can detect is 138 copies/mL. A negative result does not preclude SARS-Cov-2 infection and should not be used as the sole basis for treatment or other patient management decisions. A negative result may occur with  improper specimen collection/handling, submission of specimen other than nasopharyngeal swab, presence of viral mutation(s) within the areas targeted by this assay, and inadequate number of viral copies(<138 copies/mL). A negative result must be combined with clinical observations, patient history, and epidemiological information. The expected result is Negative.  Fact Sheet for Patients:  EntrepreneurPulse.com.au  Fact Sheet for Healthcare Providers:  IncredibleEmployment.be  This test is no t yet approved or cleared by the [place] FDA and  has been authorized for detection and/or diagnosis of SARS-CoV-2 by FDA under an Emergency Use Authorization (EUA). This EUA will remain  in effect (meaning this test can be used) for the duration of the COVID-19 declaration under Section 564(b)(1) of the Act, 21 U.S.C.section 360bbb-3(b)(1), unless the authorization is terminated  or revoked sooner.       Influenza A by PCR NEGATIVE NEGATIVE Final   Influenza B by PCR NEGATIVE NEGATIVE Final    Comment: (NOTE) The Xpert Xpress SARS-CoV-2/FLU/RSV plus assay is intended as an aid in the diagnosis of influenza from Nasopharyngeal swab specimens and should not be used as a sole basis for treatment. Nasal washings and aspirates are unacceptable for Xpert Xpress SARS-CoV-2/FLU/RSV testing.  Fact Sheet for Patients: EntrepreneurPulse.com.au  Fact Sheet for Healthcare Providers: IncredibleEmployment.be  This test is not yet approved or cleared by the [place] FDA  and has been authorized for detection and/or diagnosis of SARS-CoV-2 by FDA under an Emergency Use Authorization (EUA). This EUA will remain in effect (meaning this test can be used) for the duration of the COVID-19 declaration under Section 564(b)(1) of the Act, 21 U.S.C. section 360bbb-3(b)(1), unless the authorization is terminated or revoked.  Performed at [facility], [place] [address], [place], [place] [address]   MRSA Next Gen by PCR, Nasal     Status: None   Collection Time: 01/13/21 10:32 PM   Specimen: Nasal Mucosa; Nasal Swab  Result Value Ref Range Status   MRSA by PCR Next Gen NOT DETECTED NOT DETECTED Final    Comment: (NOTE) The GeneXpert MRSA Assay (FDA approved for NASAL specimens only), is one component of a comprehensive MRSA colonization surveillance program. It is not intended to diagnose MRSA infection nor to guide or monitor treatment for MRSA infections. Test performance is not FDA approved in patients less than 54 years old. Performed at [facility], [place] [address] [place], [place] [address]   Culture, blood (Routine X 2) w Reflex to ID Panel     Status: None (Preliminary result)   Collection Time: 01/14/21 10:15 PM   Specimen: BLOOD  Result Value Ref Range Status   Specimen Description   Final    BLOOD LEFT ANTECUBITAL Performed at [facility] [address] [place], [place] [address]    Special Requests   Final    BOTTLES DRAWN AEROBIC AND ANAEROBIC Blood Culture adequate volume Performed at [facility] [address] [place], [place] [address]    Culture   Final    NO GROWTH 1 DAY Performed at [facility], [place] [address] [place], [place] [address]    Report Status PENDING  Incomplete  Culture, blood (Routine X 2) w Reflex to ID Panel     Status: None (Preliminary result)   Collection Time: 01/14/21 10:23 PM   Specimen: BLOOD  Result Value Ref Range Status    Specimen Description   Final    BLOOD LEFT ARM Performed at [facility] [address] [place], [place] [address]    Special Requests   Final    BOTTLES DRAWN AEROBIC AND ANAEROBIC Blood Culture adequate volume Performed at [facility] [address] [place], [place] [address]    Culture   Final    NO GROWTH 1 DAY Performed at [facility], [place] [address], [place], [place] [address]    Report Status PENDING  Incomplete         Radiology Studies: CT ABDOMEN PELVIS WO CONTRAST  Result Date: 01/15/2021 CLINICAL DATA:  Sepsis. EXAM: CT ABDOMEN AND PELVIS WITHOUT CONTRAST TECHNIQUE: Multidetector CT imaging of the abdomen and pelvis was performed following the standard protocol without IV contrast. COMPARISON:  01/31/2016 FINDINGS: Lower chest: Small bilateral pleural effusions. Compressive atelectasis in the lower lobes bilaterally. Hepatobiliary: Cholecystectomy. Liver is slightly heterogeneous and most compatible with geographic hepatic steatosis. No significant biliary dilatation. Pancreas: Unremarkable. No pancreatic ductal dilatation or surrounding inflammatory changes. Spleen: Normal in size without focal abnormality. Adrenals/Urinary Tract: Normal adrenal glands. Urinary bladder is decompressed with a Foley catheter. Normal appearance of both kidneys without stones or hydronephrosis. Stomach/Bowel: Duodenal diverticulum near the pancreatic head and ampulla. Fat attenuating structure in the  right colon on sequence 2, image 64 measures 1.3 cm and suggestive for a lipoma. This lipoma was present on the previous examination. No evidence for a bowel obstruction. Presacral and perirectal edema is nonspecific. Concern for bowel wall thickening involving the distal ileum on sequence 1, image 76. There is small amount of free fluid in this area. Vascular/Lymphatic: Small amount of atherosclerotic calcifications. Negative for an aortic aneurysm. No  abdominal or pelvic lymph node enlargement. Reproductive: Status post hysterectomy. No adnexal masses. Other: Perirectal and presacral stranding / edema. Small amount of fluid in the right lower quadrant of the abdomen near the distal ileum. Pelvic surgical clips. Musculoskeletal: No acute bone abnormality. Bilateral subcutaneous edema. IMPRESSION: 1. Small amount of free fluid in the right lower quadrant of the abdomen and concern for wall thickening involving the distal ileum. Findings are concerning for enteritis but nonspecific. 2. Small bilateral pleural effusions with subcutaneous edema, presacral edema and a small amount of ascites. Findings may be associated with anasarca. 3. Hepatic steatosis. Electronically Signed   By: Markus Daft M.D.   On: 01/15/2021 16:31   CT HEAD WO CONTRAST (5MM)  Result Date: 01/15/2021 CLINICAL DATA:  Mental status change, persistent or worsening. EXAM: CT HEAD WITHOUT CONTRAST TECHNIQUE: Contiguous axial images were obtained from the base of the skull through the vertex without intravenous contrast. COMPARISON:  None. FINDINGS: Brain: There is no evidence of an acute infarct, intracranial hemorrhage, mass, midline shift, or extra-axial fluid collection. The ventricles and sulci are normal. Vascular: No hyperdense vessel. Skull: No fracture or suspicious osseous lesion. Sinuses/Orbits: Clear paranasal sinuses. Trace mastoid effusions. Unremarkable orbits. Other: None. IMPRESSION: Unremarkable CT appearance of the brain. Electronically Signed   By: Logan Bores M.D.   On: 01/15/2021 15:28   [place] EKG SITE RITE  Result Date: 01/15/2021 If Site Rite image not attached, placement could not be confirmed due to current cardiac rhythm.       Scheduled Meds:  atorvastatin  10 mg Oral Daily   chlorhexidine  15 mL Mouth Rinse BID   Chlorhexidine Gluconate Cloth  6 each Topical Q0600   heparin  5,000 Units Subcutaneous Q8H   insulin aspart  0-9 Units Subcutaneous TID WC    insulin detemir  9 Units Subcutaneous Daily   mouth rinse  15 mL Mouth Rinse q12n4p   omega-3 acid ethyl esters  1 g Oral BID   Continuous Infusions:  sodium chloride Stopped (01/14/21 1305)   dextrose 5 % and 0.45 % NaCl with KCl 40 mEq/L 50 mL/hr at 01/17/21 0700     LOS: 4 days    Time spent: 35 mins,More than 50% of that time was spent in counseling and/or coordination of care.      Shelly Coss, MD [facility] P9/20/2022, 7:49 AM

## 2021-01-17 NOTE — TOC Progression Note (Signed)
Transition of Care [facility]) - Progression Note    Patient Details  Name: LASHARA UREY MRN: [id number] Date of Birth: 1956-04-01  [facility] of [facility] CM/SW Contact  Leeroy Cha, RN Phone Number: 01/17/2021, 9:17 AM  Clinical Narrative:    Bld cultures are + for staph. capitus   Expected Discharge Plan: Home/Self Care Barriers to Discharge: Continued Medical Work up  Expected Discharge Plan and Services Expected Discharge Plan: Home/Self Care   Discharge Planning Services: CM Consult   Living arrangements for the past 2 months: Single Family Home                                       Social Determinants of Health (SDOH) Interventions    Readmission Risk Interventions No flowsheet data found.

## 2021-01-17 NOTE — Progress Notes (Signed)
eLink Physician-Brief Progress Note Patient Name: Mercedes Webb DOB: [birth year] MRN: [id number]   Date of Service  01/17/2021  HPI/Events of Note  Pt noted to have brisk UO; today's BMP pending; hemodynamics are stable  eICU Interventions  Monitor UO and f/u lab work; pt to remain on IV fluids as ordered      Intervention Category Minor Interventions: Other:  [place] Dome 01/17/2021, 3:59 AM

## 2021-01-17 NOTE — Evaluation (Signed)
Physical Therapy Evaluation Patient Details Name: Mercedes Webb MRN: [id number] DOB: Sep 09, 1955 Today's Date: 01/17/2021  History of Present Illness  65 y.o. female admitted on 01/13/21 forafter being found down on the floor, unresponsive.  In the ED she was found to be hypotensive, hypothermic, tachypneic and confused.   Pt dx with DKA/pancreatitis, SIRS vs sepsis, electrolyte abnormalities. Pt with significant PMH of DM, HTN, knee arthroscopy.  Clinical Impression  Pt was able to get up and walk a full lap around the unit with the support of RW and min guard assist from therapist.  She reported some mild DOE with O2 sats on RA in the mid to upper 90s.  She is just a little shaky on her feet, but I think will progress quickly off of the RW and back to her baseline level of independence.   PT to follow acutely for deficits listed below.        Recommendations for follow up therapy are one component of a multi-disciplinary discharge planning process, led by the attending physician.  Recommendations may be updated based on patient status, additional functional criteria and insurance authorization.  Follow Up Recommendations No PT follow up (I anticipate she will recover quickly back to baseline)    Equipment Recommendations  None recommended by PT (I anticipate she will progress quickly back to baseline)    Recommendations for Other Services       Precautions / Restrictions Precautions Precautions: Fall Precaution Comments: mildly unsteady on her feet      Mobility  Bed Mobility Overal bed mobility: Needs Assistance Bed Mobility: Supine to Sit     Supine to sit: Min guard     General bed mobility comments: Min guard assist for safety and line management.    Transfers Overall transfer level: Needs assistance Equipment used: Rolling walker (2 wheeled);None Transfers: Sit to/from Stand Sit to Stand: Min guard         General transfer comment: Min guard assist for  safety, balance, cues to stand a moment before walking away from the bed to assess for lightheadedness.  Ambulation/Gait Ambulation/Gait assistance: Min guard;Min assist Gait Distance (Feet): 20 Feet (20'x1, 400x1) Assistive device: Rolling walker (2 wheeled);1 person hand held assist Gait Pattern/deviations: Step-through pattern;Staggering left;Staggering right Gait velocity: decreased (3 standing rest breaks during gait)   General Gait Details: Did not use a RW in the room to get to the bathroom and pt using hands on furniture for balance, midly staggering gait pattern, used RW for hallway gait and did well with the walker.  Pt reported DOE 2/4, however, O2 sats remained mid to high 90s on RA during gait.  Stairs            Wheelchair Mobility    Modified Rankin (Stroke Patients Only)       Balance Overall balance assessment: Needs assistance Sitting-balance support: Feet supported;No upper extremity supported Sitting balance-Leahy Scale: Good     Standing balance support: Single extremity supported;Bilateral upper extremity supported;No upper extremity supported Standing balance-Leahy Scale: Fair                               Pertinent Vitals/Pain Pain Assessment: No/denies pain    Home Living Family/patient expects to be discharged to:: Private residence Living Arrangements: Alone (has boyfriend who checks on her daily, but he has his own place) Available Help at Discharge: Friend(s);Available PRN/intermittently Type of Home: House  Home Layout: One level Home Equipment: None      Prior Function Level of Independence: Independent         Comments: retired from [employer]: Right    Extremity/Trunk Assessment   Upper Extremity Assessment Upper Extremity Assessment: Defer to OT evaluation    Lower Extremity Assessment Lower Extremity Assessment: RLE deficits/detail;LLE  deficits/detail RLE Deficits / Details: bil LE edematous with dry scab-wounds all over both legs, strength 3+/5 and equal bil. LLE Deficits / Details: bil LE edematous with dry scab-wounds all over both legs, strength 3+/5 and equal bil.    Cervical / Trunk Assessment Cervical / Trunk Assessment: Normal  Communication   Communication: No difficulties  Cognition Arousal/Alertness: Awake/alert Behavior During Therapy: WFL for tasks assessed/performed Overall Cognitive Status: Within Functional Limits for tasks assessed                                        General Comments      Exercises General Exercises - Lower Extremity Long Arc Quad: AROM;Both;10 reps;Seated Hip Flexion/Marching: AROM;Both;10 reps;Seated Toe Raises: AROM;Both;20 reps;Seated Heel Raises: AROM;Both;20 reps;Seated   Assessment/Plan    PT Assessment Patient needs continued PT services  PT Problem List Decreased strength;Decreased activity tolerance;Decreased balance;Decreased mobility;Decreased knowledge of use of DME;Cardiopulmonary status limiting activity       PT Treatment Interventions DME instruction;Gait training;Stair training;Functional mobility training;Therapeutic activities;Therapeutic exercise;Balance training;Patient/family education    PT Goals (Current goals can be found in the Care Plan section)  Acute Rehab PT Goals Patient Stated Goal: to get back to normal PT Goal Formulation: With patient Time For Goal Achievement: 01/31/21 Potential to Achieve Goals: Good    Frequency Min 3X/week   Barriers to discharge        Co-evaluation               AM-PAC PT "6 Clicks" Mobility  Outcome Measure Help needed turning from your back to your side while in a flat bed without using bedrails?: A Little Help needed moving from lying on your back to sitting on the side of a flat bed without using bedrails?: A Little Help needed moving to and from a bed to a chair (including a  wheelchair)?: A Little Help needed standing up from a chair using your arms (e.g., wheelchair or bedside chair)?: A Little Help needed to walk in hospital room?: A Little Help needed climbing 3-5 steps with a railing? : A Little 6 Click Score: 18    End of Session   Activity Tolerance: Patient limited by fatigue;Other (comment) (limited by DOE during gait.) Patient left: in chair;with call bell/phone within reach;with chair alarm set Nurse Communication: Mobility status PT Visit Diagnosis: Muscle weakness (generalized) (M62.81);Difficulty in walking, not elsewhere classified (R26.2)    Time: 2440-1027 PT Time Calculation (min) (ACUTE ONLY): 61 min   Charges:   PT Evaluation $PT Eval Moderate Complexity: 1 Mod PT Treatments $Gait Training: 8-22 mins $Therapeutic Exercise: 8-22 mins $Therapeutic Activity: 8-22 mins       Verdene Lennert, PT, DPT  Acute Rehabilitation Ortho Tech Supervisor [phone] pager [phone]) [phone] office

## 2021-01-17 NOTE — Progress Notes (Signed)
Patient had 1 episode of emesis (undigested food), MD made aware, new orders received for PRN zofran.

## 2021-01-18 LAB — BASIC METABOLIC PANEL
Anion gap: 15 (ref 5–15)
BUN: 7 mg/dL — ABNORMAL LOW (ref 8–23)
CO2: 21 mmol/L — ABNORMAL LOW (ref 22–32)
Calcium: 8.9 mg/dL (ref 8.9–10.3)
Chloride: 103 mmol/L (ref 98–111)
Creatinine, Ser: 0.72 mg/dL (ref 0.44–1.00)
GFR, Estimated: >60 mL/min (ref >60)
Glucose, Bld: 248 mg/dL — ABNORMAL HIGH (ref 70–99)
Potassium: 2.9 mmol/L — ABNORMAL LOW (ref 3.5–5.1)
Sodium: 139 mmol/L (ref 135–145)

## 2021-01-18 LAB — GLUCOSE, CAPILLARY
Glucose-Capillary: 159 mg/dL — ABNORMAL HIGH (ref 70–99)
Glucose-Capillary: 191 mg/dL — ABNORMAL HIGH (ref 70–99)
Glucose-Capillary: 215 mg/dL — ABNORMAL HIGH (ref 70–99)
Glucose-Capillary: 276 mg/dL — ABNORMAL HIGH (ref 70–99)

## 2021-01-18 MED ORDER — "PEN NEEDLES 3/16"" 31G X 5 MM MISC": 1.0000 | Freq: Three times a day (TID) | 1 refills | Status: DC

## 2021-01-18 MED ORDER — NOVOLIN 70/30 FLEXPEN RELION (70-30) 100 UNIT/ML ~~LOC~~ SUPN: 10.0000 [IU] | PEN_INJECTOR | Freq: Two times a day (BID) | SUBCUTANEOUS | 0 refills | Status: DC

## 2021-01-18 MED ORDER — POTASSIUM CHLORIDE CRYS ER 20 MEQ PO TBCR
40.0000 meq | EXTENDED_RELEASE_TABLET | ORAL | Status: AC
  Administered 2021-01-18 (×2): 40 meq via ORAL
  Filled 2021-01-18 (×2): qty 2

## 2021-01-18 MED ORDER — TRIAMTERENE-HCTZ 37.5-25 MG PO TABS
1.0000 | ORAL_TABLET | Freq: Every day | ORAL | Status: DC
  Administered 2021-01-18: 1 via ORAL
  Filled 2021-01-18: qty 1

## 2021-01-18 MED ORDER — INSULIN STARTER KIT- PEN NEEDLES (ENGLISH)
1.0000 | Freq: Once | Status: AC
  Administered 2021-01-18: 1
  Filled 2021-01-18: qty 1

## 2021-01-18 MED ORDER — LIVING WELL WITH DIABETES BOOK
Freq: Once | Status: AC
  Filled 2021-01-18: qty 1

## 2021-01-18 MED ORDER — POTASSIUM CHLORIDE CRYS ER 20 MEQ PO TBCR: 20.0000 meq | EXTENDED_RELEASE_TABLET | Freq: Every day | ORAL | 1 refills | Status: DC

## 2021-01-18 NOTE — Progress Notes (Signed)
Inpatient Diabetes Program Recommendations  AACE/ADA: New Consensus Statement on Inpatient Glycemic Control (2015)  Target Ranges:  Prepandial:   less than 140 mg/dL      Peak postprandial:   less than 180 mg/dL (1-2 hours)      Critically ill patients:  140 - 180 mg/dL   Lab Results  Component Value Date   GLUCAP 276 (H) 01/18/2021   HGBA1C 13.6 (H) 01/13/2021    Review of Glycemic Control  CBGs 192, 255, 238 mg/dL Needs insulin titration. 70/30 insulin is recommended. Will send insulin pen video to pt. Pt was able to return demonstration with insulin pen use on 9/20. Pt has blood glucose meter at home.  Inpatient Diabetes Program Recommendations:    Recs for discharge:  Novolin/Humulin 70/30 ReliOn 10 units BID (#[id number]) Insulin pen needles (#[id number]) Metformin 1000 mg BID  Monitor blood sugars at least 3x/day. F/U with PCP within a week of discharge.  Thank you. Lorenda Peck, RD, LDN, CDE Inpatient Diabetes Coordinator [phone]

## 2021-01-18 NOTE — Evaluation (Signed)
Occupational Therapy Evaluation Patient Details Name: Mercedes Webb MRN: [id number] DOB: Dec 18, 1955 Today's Date: 01/18/2021   History of Present Illness 65 y.o. female admitted on 01/13/21 forafter being found down on the floor, unresponsive.  In the ED she was found to be hypotensive, hypothermic, tachypneic and confused.   Pt dx with DKA/pancreatitis, SIRS vs sepsis, electrolyte abnormalities. Pt with significant PMH of DM, HTN, knee arthroscopy.   Clinical Impression   Patient lives alone and is independent at baseline. Reports boyfriend checks in on her daily and plans to stay with her first few days out of hospital. Patient able to demonstrate ADL tasks listed below without any physical assistance or use of AD. Patient reports should be going home today. No acute OT needs at this time, will sign off.      Recommendations for follow up therapy are one component of a multi-disciplinary discharge planning process, led by the attending physician.  Recommendations may be updated based on patient status, additional functional criteria and insurance authorization.   Follow Up Recommendations  No OT follow up    Equipment Recommendations  None recommended by OT       Precautions / Restrictions Restrictions Weight Bearing Restrictions: No      Mobility Bed Mobility Overal bed mobility: Independent                  Transfers Overall transfer level: Independent Equipment used: None                  Balance Overall balance assessment: No apparent balance deficits (not formally assessed)                                         ADL either performed or assessed with clinical judgement   ADL Overall ADL's : Independent                                       General ADL Comments: patient ambulate to bathroom, wash hands at sink, perform lower body dressing without any physical assistance or loss of balance noted      Pertinent  Vitals/Pain Pain Assessment: No/denies pain     Hand Dominance Right   Extremity/Trunk Assessment Upper Extremity Assessment Upper Extremity Assessment: Overall WFL for tasks assessed   Lower Extremity Assessment Lower Extremity Assessment: Defer to PT evaluation   Cervical / Trunk Assessment Cervical / Trunk Assessment: Normal   Communication Communication Communication: No difficulties   Cognition Arousal/Alertness: Awake/alert Behavior During Therapy: WFL for tasks assessed/performed Overall Cognitive Status: Within Functional Limits for tasks assessed                                                Home Living Family/patient expects to be discharged to:: Private residence Living Arrangements: Alone (boyfriend checks in daily, has own home) Available Help at Discharge: Friend(s);Available PRN/intermittently Type of Home: House Home Access: Stairs to enter [employer] of Steps: 7 Entrance Stairs-Rails: Right;Left Home Layout: One level     Bathroom Shower/Tub: Tub/shower unit;Walk-in shower   Bathroom Toilet: Handicapped height     Home Equipment: None  Prior Functioning/Environment Level of Independence: Independent        Comments: retired from [employer]        OT Problem List: Decreased activity tolerance         OT Goals(Current goals can be found in the care plan section) Acute Rehab OT Goals Patient Stated Goal: home today! OT Goal Formulation: All assessment and education complete, DC therapy   AM-PAC OT "6 Clicks" Daily Activity     Outcome Measure Help from another person eating meals?: None Help from another person taking care of personal grooming?: None Help from another person toileting, which includes using toliet, bedpan, or urinal?: None Help from another person bathing (including washing, rinsing, drying)?: None Help from another person to put on and taking off regular upper body  clothing?: None Help from another person to put on and taking off regular lower body clothing?: None 6 Click Score: 24   End of Session Nurse Communication: Mobility status  Activity Tolerance: Patient tolerated treatment well Patient left: in bed;with call bell/phone within reach  OT Visit Diagnosis: Other abnormalities of gait and mobility (R26.89)                Time: 9458-5929 OT Time Calculation (min): 13 min Charges:  OT General Charges $OT Visit: 1 Visit OT Evaluation $OT Eval Low Complexity: 1 Low  Delbert Phenix OT OT pager: [place] 01/18/2021, 11:14 AM

## 2021-01-18 NOTE — Discharge Summary (Signed)
Physician Discharge Summary  WING GFELLER XYI:[id number] DOB: November 25, 1955 DOA: 01/13/2021  PCP: Lorrene Reid, PA-C  Admit date: 01/13/2021 Discharge date: 01/18/2021  Admitted From: Home Disposition:  Home  Discharge Condition:Stable CODE STATUS:FULL Diet recommendation:  Carb Modified  Brief/Interim Summary: Patient is a 65 year old female with history of diabetes type 2, hypertension, hyperlipidemia, occasional alcohol use who presented with weakness.  She was also found unresponsive by her friend on couch and called EMS.  On presentation she was alert and oriented, complaining of abdominal pain.  She was hypotensive, hypothermic, tachypneic, confused.  She was admitted under PCCM service.  After admission, hospital course was remarkable for agitation requiring Precedex drip.  She was also found to be in DKA/pancreatitis.  Started on DKA protocol.  Currently she is hemodynamically stable and she was transferred to our service on 01/17/21.  Mental status has improved and she is currently alert and oriented.  PT/OT evaluated her and did not recommend follow-up.  She is medically stable for discharge to home today.  Following problems were addressed during her hospitalization:  DKA: Hemoglobin A1c in the range of 13.  Diabetic coordinator following and recommending 70/30 insulin on DC.  She takes metformin, Rybelsus/semaglutide at home.  Continue metformin.  Semaglutide recommended to be discontinued   Acute pancreatitis/abdominal pain: Elevated lipase..  Abdomen CT/pelvis showed a small amount of bowel thickening of distal ileum,. She has history of pancreatitis in the past.  This time it was thought to be secondary to semaglutide.  No radiographic evidence of pancreatitis on CT imaging.  Abdomen is benign and she does not have any abdominal pain, nausea or vomiting.  She is tolerating advance diet  Altered mental status: CT head on presentation was negative for acute intracranial  abnormalities.  .  Required Precedex drip on admission for agitation, now weaned off.  Mental status has improved.  Currently alert and oriented  SIRS versus sepsis: Patient is afebrile, no leukocytosis.  Blood cultures showed staph capitis  on anaerobic bottle.  Most likely contamination.  She was on broad-spectrum antibiotics, repeat blood cultures have been sent, no growth till date.  We stopped antibiotics.   Electrolyte abnormalities: Persistent hypokalemia.  Continue supplementation at home   Hypertension: Resume home medications   AKI: Resolved with IV fluids.   Hyperlipidemia: On Lipitor   History of anxiety: On Effexor.   Debility/deconditioning: No follow-up recommended after PT/OT evaluation.  Patient lives alone.  Ambulates without any problem at baseline.     Discharge Diagnoses:  Active Problems:   Pancreatitis   DKA (diabetic ketoacidosis) [facility])    Discharge Instructions  Discharge Instructions     Diet Carb Modified   Complete by: As directed    Discharge instructions   Complete by: As directed    1)please take prescribed medications as instructed 2)Follow up with your PCP in a week 3)Closely monitor blood pressure at home.   Increase activity slowly   Complete by: As directed       Allergies as of 01/18/2021       Reactions   Prozac [fluoxetine Hcl] Other (See Comments)   sweating        Medication List     STOP taking these medications    potassium chloride 10 MEQ tablet Commonly known as: KLOR-CON   Rybelsus 3 MG Tabs Generic drug: Semaglutide   Rybelsus 7 MG Tabs Generic drug: Semaglutide       TAKE these medications    atorvastatin 10 MG tablet  Commonly known as: LIPITOR TAKE 1 TABLET BY MOUTH NIGHTLY. What changed:  how much to take how to take this when to take this additional instructions   blood glucose meter kit and supplies Dispense based on patient and insurance preference. Use to check fasting blood sugar once  daily.   CALTRATE 600 PO Take 600 mg by mouth daily.   losartan 25 MG tablet Commonly known as: COZAAR **NEEDS APT FOR REFILLS**TAKE 1 TABLET BY MOUTH DAILY What changed:  how much to take how to take this when to take this additional instructions   metFORMIN 1000 MG tablet Commonly known as: GLUCOPHAGE TAKE 1 TABLET BY MOUTH EVERY 12 HOURS WITH MEALS What changed: See the new instructions.   multivitamin with minerals Tabs tablet Take 1 tablet by mouth daily. Centrum Silver   NovoLIN 70/30 Kwikpen (70-30) 100 UNIT/ML KwikPen Generic drug: insulin isophane & regular human KwikPen Inject 10 Units into the skin 2 (two) times daily.   omega-3 acid ethyl esters 1 g capsule Commonly known as: LOVAZA Take 2 capsules (2 g total) by mouth 2 (two) times daily.   Pen Needles 3/16" 31G X 5 MM Misc 1 each by Does not apply route 3 (three) times daily.   potassium chloride SA 20 MEQ tablet Commonly known as: KLOR-CON Take 1 tablet (20 mEq total) by mouth daily. Take 2 pills daily for 5 days then continue taking 1 pill daily Start taking on: January 19, 2021   QC LO-DOSE ASPIRIN 81 MG EC tablet Generic drug: aspirin TAKE 1 TABLET (81 MG TOTAL) BY MOUTH DAILY. What changed: how much to take   triamterene-hydrochlorothiazide 37.5-25 MG tablet Commonly known as: MAXZIDE-25 TAKE 1 TABLET BY MOUTH DAILY.   valACYclovir 500 MG tablet Commonly known as: VALTREX TAKE 1 TABLET BY MOUTH DAILY.   venlafaxine XR 150 MG 24 hr capsule Commonly known as: EFFEXOR-XR Take 150 mg by mouth every morning.   vitamin C 1000 MG tablet Take 1,000 mg by mouth daily.   Vitamin D (Ergocalciferol) 1.25 MG (50000 UNIT) Caps capsule Commonly known as: DRISDOL TAKE 1 CAPSULE BY MOUTH EVERY 7 DAYS.   zolpidem 12.5 MG CR tablet Commonly known as: AMBIEN CR Take 12.5 mg by mouth at bedtime.        Follow-up Information     Lorrene Reid, PA-C. Schedule an appointment as soon as possible  for a visit in 1 week(s).   Specialty: Physician Assistant Contact information: [place] [address] [phone]                Allergies  Allergen Reactions   Prozac [Fluoxetine Hcl] Other (See Comments)    sweating    Consultations: PCCM   Procedures/Studies: CT ABDOMEN PELVIS WO CONTRAST  Result Date: 01/15/2021 CLINICAL DATA:  Sepsis. EXAM: CT ABDOMEN AND PELVIS WITHOUT CONTRAST TECHNIQUE: Multidetector CT imaging of the abdomen and pelvis was performed following the standard protocol without IV contrast. COMPARISON:  01/31/2016 FINDINGS: Lower chest: Small bilateral pleural effusions. Compressive atelectasis in the lower lobes bilaterally. Hepatobiliary: Cholecystectomy. Liver is slightly heterogeneous and most compatible with geographic hepatic steatosis. No significant biliary dilatation. Pancreas: Unremarkable. No pancreatic ductal dilatation or surrounding inflammatory changes. Spleen: Normal in size without focal abnormality. Adrenals/Urinary Tract: Normal adrenal glands. Urinary bladder is decompressed with a Foley catheter. Normal appearance of both kidneys without stones or hydronephrosis. Stomach/Bowel: Duodenal diverticulum near the pancreatic head and ampulla. Fat attenuating structure in the right colon on  sequence 2, image 64 measures 1.3 cm and suggestive for a lipoma. This lipoma was present on the previous examination. No evidence for a bowel obstruction. Presacral and perirectal edema is nonspecific. Concern for bowel wall thickening involving the distal ileum on sequence 1, image 76. There is small amount of free fluid in this area. Vascular/Lymphatic: Small amount of atherosclerotic calcifications. Negative for an aortic aneurysm. No abdominal or pelvic lymph node enlargement. Reproductive: Status post hysterectomy. No adnexal masses. Other: Perirectal and presacral stranding / edema. Small amount of fluid in the right lower quadrant  of the abdomen near the distal ileum. Pelvic surgical clips. Musculoskeletal: No acute bone abnormality. Bilateral subcutaneous edema. IMPRESSION: 1. Small amount of free fluid in the right lower quadrant of the abdomen and concern for wall thickening involving the distal ileum. Findings are concerning for enteritis but nonspecific. 2. Small bilateral pleural effusions with subcutaneous edema, presacral edema and a small amount of ascites. Findings may be associated with anasarca. 3. Hepatic steatosis. Electronically Signed   By: Markus Daft M.D.   On: 01/15/2021 16:31   DG Chest 1 View  Result Date: 01/14/2021 CLINICAL DATA:  Diabetic ketoacidosis. EXAM: CHEST  1 VIEW COMPARISON:  Chest x-ray 01/13/2021. FINDINGS: The cardiac silhouette is enlarged. There central pulmonary vascular congestion with some patchy perihilar opacities, left greater than right. The costophrenic angles are clear. There is no evidence for pneumothorax. Mediastinal silhouette is within normal limits for projection. No acute fractures are seen. IMPRESSION: 1. Cardiomegaly with bilateral central opacities favored is pulmonary edema. Infection cannot be excluded. Electronically Signed   By: Ronney Asters M.D.   On: 01/14/2021 22:47   CT HEAD WO CONTRAST (5MM)  Result Date: 01/15/2021 CLINICAL DATA:  Mental status change, persistent or worsening. EXAM: CT HEAD WITHOUT CONTRAST TECHNIQUE: Contiguous axial images were obtained from the base of the skull through the vertex without intravenous contrast. COMPARISON:  None. FINDINGS: Brain: There is no evidence of an acute infarct, intracranial hemorrhage, mass, midline shift, or extra-axial fluid collection. The ventricles and sulci are normal. Vascular: No hyperdense vessel. Skull: No fracture or suspicious osseous lesion. Sinuses/Orbits: Clear paranasal sinuses. Trace mastoid effusions. Unremarkable orbits. Other: None. IMPRESSION: Unremarkable CT appearance of the brain. Electronically  Signed   By: Logan Bores M.D.   On: 01/15/2021 15:28   US Abdomen Complete  Result Date: 01/14/2021 CLINICAL DATA:  Acute abdominal pain, pancreatitis. EXAM: ABDOMEN ULTRASOUND COMPLETE COMPARISON:  CT abdomen and pelvis 01/31/2016 FINDINGS: Gallbladder: Surgically absent. Common bile duct: Diameter: 6 mm, normal Liver: Diffusely increased parenchymal echotexture suggesting fatty infiltration. No focal lesions. Portal vein is patent on color Doppler imaging with normal direction of blood flow towards the liver. IVC: No abnormality visualized. Pancreas: Visualized portion unremarkable. Spleen: Size and appearance within normal limits. Right Kidney: Length: 11.4 cm. Echogenicity within normal limits. No mass or hydronephrosis visualized. Left Kidney: Length: 10.7 cm. Echogenicity within normal limits. No mass or hydronephrosis visualized. Abdominal aorta: No aneurysm visualized. Other findings: None. IMPRESSION: Surgical absence of the gallbladder. Diffusely increased liver parenchymal echotexture suggesting fatty infiltration. No acute abnormalities. Electronically Signed   By: Lucienne Capers M.D.   On: 01/14/2021 00:28   DG Chest Port 1 View  Result Date: 01/13/2021 CLINICAL DATA:  Elevated blood sugar and weakness, found unresponsive. EXAM: PORTABLE CHEST 1 VIEW COMPARISON:  January 31, 2016 FINDINGS: The study is limited secondary to patient positioning. There is no evidence of acute infiltrate, pleural effusion or pneumothorax. A well-defined  hazy opacity is seen overlying the lateral aspect of the mid and upper left lung. Deviation of the heart and mediastinal structures is noted to the right. The visualized skeletal structures are unremarkable. IMPRESSION: Findings, as described above which are likely secondary to extensive patient rotation. Repeat examination is recommended. Electronically Signed   By: Virgina Norfolk M.D.   On: 01/13/2021 19:52   [place] EKG SITE RITE  Result Date: 01/15/2021 If  Site Rite image not attached, placement could not be confirmed due to current cardiac rhythm.     Subjective:  Patient seen and examined the bedside this morning.  Medically stable for discharge.   Discharge Exam: Vitals:   01/18/21 0553 01/18/21 0936  BP: (!) 161/77 140/80  Pulse: 86 (!) 101  Resp: 16 16  Temp: 98.4 F (36.9 C) 98 F (36.7 C)  SpO2: 97% 100%   Vitals:   01/17/21 2029 01/18/21 0022 01/18/21 0553 01/18/21 0936  BP: (!) 161/76 (!) 123/96 (!) 161/77 140/80  Pulse: (!) 105 (!) 104 86 (!) 101  Resp: _0 Temp: 98.1 F (36.7 C) 98.1 F (36.7 C) 98.4 F (36.9 C) 98 F (36.7 C)  TempSrc: Oral Oral Oral Oral  SpO2: 100% 97% 97% 100%  Weight:      Height:        General: Pt is alert, awake, not in acute distress Cardiovascular: RRR, S1/S2 +, no rubs, no gallops Respiratory: CTA bilaterally, no wheezing, no rhonchi Abdominal: Soft, NT, ND, bowel sounds + Extremities: no edema, no cyanosis    The results of significant diagnostics from this hospitalization (including imaging, microbiology, ancillary and laboratory) are listed below for reference.     Microbiology: Recent Results (from the past 240 hour(s))  Blood culture (routine single)     Status: Abnormal   Collection Time: 01/13/21  5:41 PM   Specimen: BLOOD  Result Value Ref Range Status   Specimen Description   Final    BLOOD RIGHT ANTECUBITAL Performed at [facility] [address], [place], [place] [address]    Special Requests   Final    BOTTLES DRAWN AEROBIC AND ANAEROBIC Blood Culture adequate volume Performed at [facility] [address], [place], [place] [address]    Culture  Setup Time   Final    GRAM POSITIVE COCCI IN CLUSTERS ANAEROBIC BOTTLE ONLY CRITICAL RESULT CALLED TO, READ BACK BY AND VERIFIED WITH: PHARM D M.TUCKER ON [id number] AT 0857 BY E.PARRISH GRAM POSITIVE RODS AEROBIC BOTTLE ONLY CRITICAL RESULT CALLED TO, READ BACK  BY AND VERIFIED WITH: PHARMD A PHAN [id number] AT 104AM B Y CM    Culture (A)  Final    STAPHYLOCOCCUS CAPITIS THE SIGNIFICANCE OF ISOLATING THIS ORGANISM FROM A SINGLE VENIPUNCTURE CANNOT BE PREDICTED WITHOUT FURTHER CLINICAL AND CULTURE CORRELATION. SUSCEPTIBILITIES AVAILABLE ONLY ON REQUEST. DIPHTHEROIDS(CORYNEBACTERIUM SPECIES) Standardized susceptibility testing for this organism is not available. Performed at [facility], [place] [address], [place], [place] [address]    Report Status 01/17/2021 FINAL  Final  Blood Culture ID Panel (Reflexed)     Status: Abnormal   Collection Time: 01/13/21  5:41 PM  Result Value Ref Range Status   Enterococcus faecalis NOT DETECTED NOT DETECTED Final   Enterococcus Faecium NOT DETECTED NOT DETECTED Final   Listeria monocytogenes NOT DETECTED NOT DETECTED Final   Staphylococcus species DETECTED (A) NOT DETECTED Final    Comment: CRITICAL RESULT CALLED TO, READ BACK BY AND VERIFIED WITH:  PHARM D M.TUCKER ON [id number] AT 0857 BY E.PARRISH    Staphylococcus aureus (BCID) NOT DETECTED NOT DETECTED Final   Staphylococcus epidermidis NOT DETECTED NOT DETECTED Final   Staphylococcus lugdunensis NOT DETECTED NOT DETECTED Final   Streptococcus species NOT DETECTED NOT DETECTED Final   Streptococcus agalactiae NOT DETECTED NOT DETECTED Final   Streptococcus pneumoniae NOT DETECTED NOT DETECTED Final   Streptococcus pyogenes NOT DETECTED NOT DETECTED Final   A.calcoaceticus-baumannii NOT DETECTED NOT DETECTED Final   Bacteroides fragilis NOT DETECTED NOT DETECTED Final   Enterobacterales NOT DETECTED NOT DETECTED Final   Enterobacter cloacae complex NOT DETECTED NOT DETECTED Final   Escherichia coli NOT DETECTED NOT DETECTED Final   Klebsiella aerogenes NOT DETECTED NOT DETECTED Final   Klebsiella oxytoca NOT DETECTED NOT DETECTED Final   Klebsiella pneumoniae NOT DETECTED NOT DETECTED Final   Proteus species NOT DETECTED NOT DETECTED Final   Salmonella  species NOT DETECTED NOT DETECTED Final   Serratia marcescens NOT DETECTED NOT DETECTED Final   Haemophilus influenzae NOT DETECTED NOT DETECTED Final   Neisseria meningitidis NOT DETECTED NOT DETECTED Final   Pseudomonas aeruginosa NOT DETECTED NOT DETECTED Final   Stenotrophomonas maltophilia NOT DETECTED NOT DETECTED Final   Candida albicans NOT DETECTED NOT DETECTED Final   Candida auris NOT DETECTED NOT DETECTED Final   Candida glabrata NOT DETECTED NOT DETECTED Final   Candida krusei NOT DETECTED NOT DETECTED Final   Candida parapsilosis NOT DETECTED NOT DETECTED Final   Candida tropicalis NOT DETECTED NOT DETECTED Final   Cryptococcus neoformans/gattii NOT DETECTED NOT DETECTED Final    Comment: Performed at [facility], [address] [place], [place] [address]  Urine Culture     Status: None   Collection Time: 01/13/21  8:40 PM   Specimen: In/Out Cath Urine  Result Value Ref Range Status   Specimen Description   Final    IN/OUT CATH URINE Performed at [facility] [address], [place], [place] [address]    Special Requests   Final    NONE Performed at [facility], [place] [address], [place], [place] [address]    Culture   Final    NO GROWTH Performed at [facility], [place] [address], [place], [place] [address]    Report Status 01/15/2021 FINAL  Final  Resp Panel by RT-PCR (Flu A&B, Covid) Nasopharyngeal Swab     Status: None   Collection Time: 01/13/21  9:14 PM   Specimen: Nasopharyngeal Swab; Nasopharyngeal(NP) swabs in vial transport medium  Result Value Ref Range Status   SARS Coronavirus 2 by RT PCR NEGATIVE NEGATIVE Final    Comment: (NOTE) SARS-CoV-2 target nucleic acids are NOT DETECTED.  The SARS-CoV-2 RNA is generally detectable in upper respiratory specimens during the acute phase of infection. The lowest concentration of SARS-CoV-2 viral copies this assay can detect is 138 copies/mL. A negative result  does not preclude SARS-Cov-2 infection and should not be used as the sole basis for treatment or other patient management decisions. A negative result may occur with  improper specimen collection/handling, submission of specimen other than nasopharyngeal swab, presence of viral mutation(s) within the areas targeted by this assay, and inadequate number of viral copies(<138 copies/mL). A negative result must be combined with clinical observations, patient history, and epidemiological information. The expected result is Negative.  Fact Sheet for Patients:  EntrepreneurPulse.com.au  Fact Sheet for Healthcare Providers:  IncredibleEmployment.be  This test is no t yet approved  or cleared by the [place] and  has been authorized for detection and/or diagnosis of SARS-CoV-2 by FDA under an Emergency Use Authorization (EUA). This EUA will remain  in effect (meaning this test can be used) for the duration of the COVID-19 declaration under Section 564(b)(1) of the Act, 21 U.S.C.section 360bbb-3(b)(1), unless the authorization is terminated  or revoked sooner.       Influenza A by PCR NEGATIVE NEGATIVE Final   Influenza B by PCR NEGATIVE NEGATIVE Final    Comment: (NOTE) The Xpert Xpress SARS-CoV-2/FLU/RSV plus assay is intended as an aid in the diagnosis of influenza from Nasopharyngeal swab specimens and should not be used as a sole basis for treatment. Nasal washings and aspirates are unacceptable for Xpert Xpress SARS-CoV-2/FLU/RSV testing.  Fact Sheet for Patients: EntrepreneurPulse.com.au  Fact Sheet for Healthcare Providers: IncredibleEmployment.be  This test is not yet approved or cleared by the [place] FDA and has been authorized for detection and/or diagnosis of SARS-CoV-2 by FDA under an Emergency Use Authorization (EUA). This EUA will remain in effect (meaning this test can be used) for  the duration of the COVID-19 declaration under Section 564(b)(1) of the Act, 21 U.S.C. section 360bbb-3(b)(1), unless the authorization is terminated or revoked.  Performed at [facility], [place] [address] [place], [place] [address]   MRSA Next Gen by PCR, Nasal     Status: None   Collection Time: 01/13/21 10:32 PM   Specimen: Nasal Mucosa; Nasal Swab  Result Value Ref Range Status   MRSA by PCR Next Gen NOT DETECTED NOT DETECTED Final    Comment: (NOTE) The GeneXpert MRSA Assay (FDA approved for NASAL specimens only), is one component of a comprehensive MRSA colonization surveillance program. It is not intended to diagnose MRSA infection nor to guide or monitor treatment for MRSA infections. Test performance is not FDA approved in patients less than 33 years old. Performed at [facility], [place] [address] [place], [place] [address]   Culture, blood (Routine X 2) w Reflex to ID Panel     Status: None (Preliminary result)   Collection Time: 01/14/21 10:15 PM   Specimen: BLOOD  Result Value Ref Range Status   Specimen Description   Final    BLOOD LEFT ANTECUBITAL Performed at [facility] [address] [place], [place] [address]    Special Requests   Final    BOTTLES DRAWN AEROBIC AND ANAEROBIC Blood Culture adequate volume Performed at [facility] [address] [place], [place] [address]    Culture   Final    NO GROWTH 3 DAYS Performed at [facility], [place] [address] [place], [place] [address]    Report Status PENDING  Incomplete  Culture, blood (Routine X 2) w Reflex to ID Panel     Status: None (Preliminary result)   Collection Time: 01/14/21 10:23 PM   Specimen: BLOOD  Result Value Ref Range Status   Specimen Description   Final    BLOOD LEFT ARM Performed at [facility] [address], [place], [place] [address]    Special Requests   Final    BOTTLES DRAWN  AEROBIC AND ANAEROBIC Blood Culture adequate volume Performed at [facility] [address] [place], [place] [address]    Culture   Final    NO GROWTH 3 DAYS Performed at [facility], [place] [address], [place], [place] [address]  Report Status PENDING  Incomplete  Culture, blood (Routine X 2) w Reflex to ID Panel     Status: None (Preliminary result)   Collection Time: 01/16/21 10:37 AM   Specimen: BLOOD LEFT HAND  Result Value Ref Range Status   Specimen Description   Final    BLOOD LEFT HAND Performed at [facility] [address] [place], [place] [address]    Special Requests   Final    BOTTLES DRAWN AEROBIC ONLY Blood Culture results may not be optimal due to an inadequate volume of blood received in culture bottles Performed at [facility] [address], [place], [place] [address]    Culture   Final    NO GROWTH 2 DAYS Performed at [facility] [address], [place], [place] [address]    Report Status PENDING  Incomplete  Culture, blood (Routine X 2) w Reflex to ID Panel     Status: None (Preliminary result)   Collection Time: 01/16/21 10:38 AM   Specimen: BLOOD RIGHT HAND  Result Value Ref Range Status   Specimen Description   Final    BLOOD RIGHT HAND Performed at [facility] [address] [place], [place] [address]    Special Requests   Final    BOTTLES DRAWN AEROBIC ONLY Blood Culture results may not be optimal due to an inadequate volume of blood received in culture bottles Performed at [facility] [address] [place], [place] [address]    Culture   Final    NO GROWTH 2 DAYS Performed at [facility] [address] [place], [place] [address]    Report Status PENDING  Incomplete     Labs: BNP (last 3 results) No results for input(s): BNP in the last 8760 hours. Basic Metabolic Panel: Recent Labs  Lab 01/14/21 2215  01/15/21 0133 01/16/21 0235 01/17/21 0320 01/18/21 0510  NA 141 141 137 142 139  K 2.5* 2.9* 3.0* 3.7 2.9*  CL 114* 112* 111 114* 103  CO2 21* 22 21* 18* 21*  GLUCOSE 108* 126* 132* 190* 248*  BUN _0 7*  CREATININE 0.84 0.79 0.68 0.72 0.72  CALCIUM 8.9 8.4* 7.9* 8.9 8.9  MG 1.6*  --   --   --   --   PHOS 1.3*  --   --   --   --    Liver Function Tests: Recent Labs  Lab 01/13/21 1749 01/14/21 2215  AST 56* 37  ALT 41 26  ALKPHOS 125 77  BILITOT 2.4* 0.6  PROT 6.9 5.5*  ALBUMIN 3.3* 2.6*   Recent Labs  Lab 01/13/21 1749 01/16/21 1036  LIPASE 320* 39   No results for input(s): AMMONIA in the last 168 hours. CBC: Recent Labs  Lab 01/13/21 1749 01/13/21 1804 01/13/21 2040 01/17/21 0320 01/17/21 0630  WBC 25.1*  --  19.4* 5.7  --   NEUTROABS 20.5*  --   --   --   --   HGB 14.2 14.6 12.8 12.5  --   HCT 45.4 43.0 40.1 36.3  --   MCV 97.0  --  96.4 89.2  --   PLT 469*  --  319 PLATELET CLUMPS NOTED ON SMEAR, COUNT APPEARS DECREASED 158   Cardiac Enzymes: No results for input(s): CKTOTAL, CKMB, CKMBINDEX, TROPONINI in the last 168 hours. BNP: Invalid input(s): POCBNP CBG: Recent Labs  Lab 01/17/21 1156 01/17/21 1621 01/18/21 0024 01/18/21  0447 01/18/21 0736  GLUCAP 238* 244* 191* 215* 276*   D-Dimer No results for input(s): DDIMER in the last 72 hours. Hgb A1c No results for input(s): HGBA1C in the last 72 hours. Lipid Profile No results for input(s): CHOL, HDL, LDLCALC, TRIG, CHOLHDL, LDLDIRECT in the last 72 hours. Thyroid function studies No results for input(s): TSH, T4TOTAL, T3FREE, THYROIDAB in the last 72 hours.  Invalid input(s): FREET3 Anemia work up No results for input(s): VITAMINB12, FOLATE, FERRITIN, TIBC, IRON, RETICCTPCT in the last 72 hours. Urinalysis    Component Value Date/Time   COLORURINE YELLOW (A) 01/13/2021 2040   APPEARANCEUR CLEAR (A) 01/13/2021 2040   LABSPEC 1.015 01/13/2021 2040   PHURINE 6.0 01/13/2021 2040    GLUCOSEU >1,000 (A) 01/13/2021 2040   HGBUR LARGE (A) 01/13/2021 2040   BILIRUBINUR SMALL (A) 01/13/2021 2040   BILIRUBINUR negative 08/01/2020 0947   KETONESUR >80 (A) 01/13/2021 2040   PROTEINUR 30 (A) 01/13/2021 2040   UROBILINOGEN 2.0 (A) 08/01/2020 0947   NITRITE NEGATIVE 01/13/2021 2040   LEUKOCYTESUR TRACE (A) 01/13/2021 2040   Sepsis Labs Invalid input(s): PROCALCITONIN,  WBC,  LACTICIDVEN Microbiology Recent Results (from the past 240 hour(s))  Blood culture (routine single)     Status: Abnormal   Collection Time: 01/13/21  5:41 PM   Specimen: BLOOD  Result Value Ref Range Status   Specimen Description   Final    BLOOD RIGHT ANTECUBITAL Performed at [facility], [place] [address], [place], [place] [address]    Special Requests   Final    BOTTLES DRAWN AEROBIC AND ANAEROBIC Blood Culture adequate volume Performed at [place] [address], [place], [place] [address]    Culture  Setup Time   Final    GRAM POSITIVE COCCI IN CLUSTERS ANAEROBIC BOTTLE ONLY CRITICAL RESULT CALLED TO, READ BACK BY AND VERIFIED WITH: PHARM D M.TUCKER ON [id number] AT 0857 BY E.PARRISH GRAM POSITIVE RODS AEROBIC BOTTLE ONLY CRITICAL RESULT CALLED TO, READ BACK BY AND VERIFIED WITH: PHARMD A PHAN [id number] AT 40AM B Y CM    Culture (A)  Final    STAPHYLOCOCCUS CAPITIS THE SIGNIFICANCE OF ISOLATING THIS ORGANISM FROM A SINGLE VENIPUNCTURE CANNOT BE PREDICTED WITHOUT FURTHER CLINICAL AND CULTURE CORRELATION. SUSCEPTIBILITIES AVAILABLE ONLY ON REQUEST. DIPHTHEROIDS(CORYNEBACTERIUM SPECIES) Standardized susceptibility testing for this organism is not available. Performed at [facility], [place] [address] [place], [place] [address]    Report Status 01/17/2021 FINAL  Final  Blood Culture ID Panel (Reflexed)     Status: Abnormal   Collection Time: 01/13/21  5:41 PM  Result Value Ref Range Status   Enterococcus faecalis NOT DETECTED NOT DETECTED Final    Enterococcus Faecium NOT DETECTED NOT DETECTED Final   Listeria monocytogenes NOT DETECTED NOT DETECTED Final   Staphylococcus species DETECTED (A) NOT DETECTED Final    Comment: CRITICAL RESULT CALLED TO, READ BACK BY AND VERIFIED WITH: PHARM D M.TUCKER ON [id number] AT 0857 BY E.PARRISH    Staphylococcus aureus (BCID) NOT DETECTED NOT DETECTED Final   Staphylococcus epidermidis NOT DETECTED NOT DETECTED Final   Staphylococcus lugdunensis NOT DETECTED NOT DETECTED Final   Streptococcus species NOT DETECTED NOT DETECTED Final   Streptococcus agalactiae NOT DETECTED NOT DETECTED Final   Streptococcus pneumoniae NOT DETECTED NOT DETECTED Final   Streptococcus pyogenes NOT DETECTED NOT DETECTED Final   A.calcoaceticus-baumannii NOT DETECTED NOT DETECTED Final   Bacteroides fragilis NOT DETECTED NOT DETECTED Final   Enterobacterales NOT DETECTED NOT  DETECTED Final   Enterobacter cloacae complex NOT DETECTED NOT DETECTED Final   Escherichia coli NOT DETECTED NOT DETECTED Final   Klebsiella aerogenes NOT DETECTED NOT DETECTED Final   Klebsiella oxytoca NOT DETECTED NOT DETECTED Final   Klebsiella pneumoniae NOT DETECTED NOT DETECTED Final   Proteus species NOT DETECTED NOT DETECTED Final   Salmonella species NOT DETECTED NOT DETECTED Final   Serratia marcescens NOT DETECTED NOT DETECTED Final   Haemophilus influenzae NOT DETECTED NOT DETECTED Final   Neisseria meningitidis NOT DETECTED NOT DETECTED Final   Pseudomonas aeruginosa NOT DETECTED NOT DETECTED Final   Stenotrophomonas maltophilia NOT DETECTED NOT DETECTED Final   Candida albicans NOT DETECTED NOT DETECTED Final   Candida auris NOT DETECTED NOT DETECTED Final   Candida glabrata NOT DETECTED NOT DETECTED Final   Candida krusei NOT DETECTED NOT DETECTED Final   Candida parapsilosis NOT DETECTED NOT DETECTED Final   Candida tropicalis NOT DETECTED NOT DETECTED Final   Cryptococcus neoformans/gattii NOT DETECTED NOT DETECTED Final     Comment: Performed at [facility], [place] [address] [place], [place] [address]  Urine Culture     Status: None   Collection Time: 01/13/21  8:40 PM   Specimen: In/Out Cath Urine  Result Value Ref Range Status   Specimen Description   Final    IN/OUT CATH URINE Performed at [place] [address] [place], [place] [address]    Special Requests   Final    NONE Performed at [facility], [place] [address], [place], [place] [address]    Culture   Final    NO GROWTH Performed at [facility], [place] [address], [place], [place] [address]    Report Status 01/15/2021 FINAL  Final  Resp Panel by RT-PCR (Flu A&B, Covid) Nasopharyngeal Swab     Status: None   Collection Time: 01/13/21  9:14 PM   Specimen: Nasopharyngeal Swab; Nasopharyngeal(NP) swabs in vial transport medium  Result Value Ref Range Status   SARS Coronavirus 2 by RT PCR NEGATIVE NEGATIVE Final    Comment: (NOTE) SARS-CoV-2 target nucleic acids are NOT DETECTED.  The SARS-CoV-2 RNA is generally detectable in upper respiratory specimens during the acute phase of infection. The lowest concentration of SARS-CoV-2 viral copies this assay can detect is 138 copies/mL. A negative result does not preclude SARS-Cov-2 infection and should not be used as the sole basis for treatment or other patient management decisions. A negative result may occur with  improper specimen collection/handling, submission of specimen other than nasopharyngeal swab, presence of viral mutation(s) within the areas targeted by this assay, and inadequate number of viral copies(<138 copies/mL). A negative result must be combined with clinical observations, patient history, and epidemiological information. The expected result is Negative.  Fact Sheet for Patients:  EntrepreneurPulse.com.au  Fact Sheet for Healthcare Providers:  IncredibleEmployment.be  This test is  no t yet approved or cleared by the [place] FDA and  has been authorized for detection and/or diagnosis of SARS-CoV-2 by FDA under an Emergency Use Authorization (EUA). This EUA will remain  in effect (meaning this test can be used) for the duration of the COVID-19 declaration under Section 564(b)(1) of the Act, 21 U.S.C.section 360bbb-3(b)(1), unless the authorization is terminated  or revoked sooner.       Influenza A by PCR NEGATIVE NEGATIVE Final   Influenza B by PCR NEGATIVE NEGATIVE Final    Comment: (NOTE) The Xpert Xpress SARS-CoV-2/FLU/RSV plus assay  is intended as an aid in the diagnosis of influenza from Nasopharyngeal swab specimens and should not be used as a sole basis for treatment. Nasal washings and aspirates are unacceptable for Xpert Xpress SARS-CoV-2/FLU/RSV testing.  Fact Sheet for Patients: EntrepreneurPulse.com.au  Fact Sheet for Healthcare Providers: IncredibleEmployment.be  This test is not yet approved or cleared by the [place] FDA and has been authorized for detection and/or diagnosis of SARS-CoV-2 by FDA under an Emergency Use Authorization (EUA). This EUA will remain in effect (meaning this test can be used) for the duration of the COVID-19 declaration under Section 564(b)(1) of the Act, 21 U.S.C. section 360bbb-3(b)(1), unless the authorization is terminated or revoked.  Performed at [facility], [place] [address], [place], [place] [address]   MRSA Next Gen by PCR, Nasal     Status: None   Collection Time: 01/13/21 10:32 PM   Specimen: Nasal Mucosa; Nasal Swab  Result Value Ref Range Status   MRSA by PCR Next Gen NOT DETECTED NOT DETECTED Final    Comment: (NOTE) The GeneXpert MRSA Assay (FDA approved for NASAL specimens only), is one component of a comprehensive MRSA colonization surveillance program. It is not intended to diagnose MRSA infection nor to guide or monitor  treatment for MRSA infections. Test performance is not FDA approved in patients less than 27 years old. Performed at [facility], [place] [address], [place], [place] [address]   Culture, blood (Routine X 2) w Reflex to ID Panel     Status: None (Preliminary result)   Collection Time: 01/14/21 10:15 PM   Specimen: BLOOD  Result Value Ref Range Status   Specimen Description   Final    BLOOD LEFT ANTECUBITAL Performed at [facility] [address] [place], [place] [address]    Special Requests   Final    BOTTLES DRAWN AEROBIC AND ANAEROBIC Blood Culture adequate volume Performed at [place] [address] [place], [place] [address]    Culture   Final    NO GROWTH 3 DAYS Performed at [facility], [place] [address] [place], [place] [address]    Report Status PENDING  Incomplete  Culture, blood (Routine X 2) w Reflex to ID Panel     Status: None (Preliminary result)   Collection Time: 01/14/21 10:23 PM   Specimen: BLOOD  Result Value Ref Range Status   Specimen Description   Final    BLOOD LEFT ARM Performed at [facility] [address], [place], [place] [address]    Special Requests   Final    BOTTLES DRAWN AEROBIC AND ANAEROBIC Blood Culture adequate volume Performed at [place] [address] [place], [place] [address]    Culture   Final    NO GROWTH 3 DAYS Performed at [facility], [place] [address] [place], [place] [address]    Report Status PENDING  Incomplete  Culture, blood (Routine X 2) w Reflex to ID Panel     Status: None (Preliminary result)   Collection Time: 01/16/21 10:37 AM   Specimen: BLOOD LEFT HAND  Result Value Ref Range Status   Specimen Description   Final    BLOOD LEFT HAND Performed at [facility] [address] [place],  [address]    Special Requests   Final    BOTTLES DRAWN AEROBIC ONLY Blood Culture  results may not be optimal due to an inadequate volume of  blood received in culture bottles Performed at [facility], [place] [address], [place], [place] [address]    Culture   Final    NO GROWTH 2 DAYS Performed at [facility] [address], [place], [place] [address]    Report Status PENDING  Incomplete  Culture, blood (Routine X 2) w Reflex to ID Panel     Status: None (Preliminary result)   Collection Time: 01/16/21 10:38 AM   Specimen: BLOOD RIGHT HAND  Result Value Ref Range Status   Specimen Description   Final    BLOOD RIGHT HAND Performed at [facility] [address], [place], [place] [address]    Special Requests   Final    BOTTLES DRAWN AEROBIC ONLY Blood Culture results may not be optimal due to an inadequate volume of blood received in culture bottles Performed at [facility] [address], [place], [place] [address]    Culture   Final    NO GROWTH 2 DAYS Performed at [facility] [address], [place], [place] [address]    Report Status PENDING  Incomplete    Please note: You were cared for by a hospitalist during your hospital stay. Once you are discharged, your primary care physician will handle any further medical issues. Please note that NO REFILLS for any discharge medications will be authorized once you are discharged, as it is imperative that you return to your primary care physician (or establish a relationship with a primary care physician if you do not have one) for your post hospital discharge needs so that they can reassess your need for medications and monitor your lab values.    Time coordinating discharge: 40 minutes  SIGNED:   Shelly Coss, MD  [facility] 01/18/2021, 11:33 AM Pager [phone]  If 7PM-7AM, please contact night-coverage [email or web address] Password [email or web address]

## 2021-01-19 ENCOUNTER — Other Ambulatory Visit: Payer: Self-pay | Admitting: Physician Assistant

## 2021-01-19 DIAGNOSIS — I10 Essential (primary) hypertension: Secondary | ICD-10-CM

## 2021-01-19 DIAGNOSIS — E669 Obesity, unspecified: Secondary | ICD-10-CM

## 2021-01-21 LAB — CULTURE, BLOOD (ROUTINE X 2)
Culture: NO GROWTH
Culture: NO GROWTH

## 2021-01-23 LAB — CULTURE, BLOOD (ROUTINE X 2)
Culture: NO GROWTH
Culture: NO GROWTH
Special Requests: ADEQUATE
Special Requests: ADEQUATE

## 2021-01-24 ENCOUNTER — Other Ambulatory Visit: Payer: Self-pay | Admitting: Physician Assistant

## 2021-01-24 DIAGNOSIS — E1129 Type 2 diabetes mellitus with other diabetic kidney complication: Secondary | ICD-10-CM

## 2021-01-24 DIAGNOSIS — I10 Essential (primary) hypertension: Secondary | ICD-10-CM

## 2021-01-24 DIAGNOSIS — I152 Hypertension secondary to endocrine disorders: Secondary | ICD-10-CM

## 2021-01-24 DIAGNOSIS — E1159 Type 2 diabetes mellitus with other circulatory complications: Secondary | ICD-10-CM

## 2021-01-24 DIAGNOSIS — R809 Proteinuria, unspecified: Secondary | ICD-10-CM

## 2021-01-24 DIAGNOSIS — B009 Herpesviral infection, unspecified: Secondary | ICD-10-CM

## 2021-01-24 DIAGNOSIS — E782 Mixed hyperlipidemia: Secondary | ICD-10-CM

## 2021-01-26 ENCOUNTER — Ambulatory Visit: Payer: Medicare Other

## 2021-01-30 ENCOUNTER — Ambulatory Visit (INDEPENDENT_AMBULATORY_CARE_PROVIDER_SITE_OTHER): Payer: Medicare Other | Admitting: Physician Assistant

## 2021-01-30 ENCOUNTER — Other Ambulatory Visit: Payer: Self-pay

## 2021-01-30 ENCOUNTER — Encounter: Payer: Self-pay | Admitting: Physician Assistant

## 2021-01-30 VITALS — BP 110/64 | HR 102 | Temp 98.6°F | Ht 67.0 in | Wt 162.4 lb

## 2021-01-30 DIAGNOSIS — Z09 Encounter for follow-up examination after completed treatment for conditions other than malignant neoplasm: Secondary | ICD-10-CM

## 2021-01-30 DIAGNOSIS — E876 Hypokalemia: Secondary | ICD-10-CM | POA: Diagnosis not present

## 2021-01-30 DIAGNOSIS — E1169 Type 2 diabetes mellitus with other specified complication: Secondary | ICD-10-CM | POA: Diagnosis not present

## 2021-01-30 DIAGNOSIS — E782 Mixed hyperlipidemia: Secondary | ICD-10-CM

## 2021-01-30 DIAGNOSIS — E1159 Type 2 diabetes mellitus with other circulatory complications: Secondary | ICD-10-CM

## 2021-01-30 DIAGNOSIS — E1110 Type 2 diabetes mellitus with ketoacidosis without coma: Secondary | ICD-10-CM | POA: Diagnosis not present

## 2021-01-30 DIAGNOSIS — E559 Vitamin D deficiency, unspecified: Secondary | ICD-10-CM

## 2021-01-30 DIAGNOSIS — I152 Hypertension secondary to endocrine disorders: Secondary | ICD-10-CM

## 2021-01-30 DIAGNOSIS — K859 Acute pancreatitis without necrosis or infection, unspecified: Secondary | ICD-10-CM

## 2021-01-30 MED ORDER — GLUCOSE BLOOD VI STRP: ORAL_STRIP | 6 refills | Status: DC

## 2021-01-30 MED ORDER — NOVOLIN 70/30 FLEXPEN RELION (70-30) 100 UNIT/ML ~~LOC~~ SUPN: PEN_INJECTOR | SUBCUTANEOUS | 2 refills | Status: DC

## 2021-01-30 MED ORDER — ACCU-CHEK SOFTCLIX LANCETS MISC: 6 refills | Status: DC

## 2021-01-30 NOTE — Progress Notes (Addendum)
Established Patient Office Visit  Subjective:  Patient ID: Mercedes Webb, female    DOB: Jan 16, 1956  Age: 65 y.o. MRN: [id number]  CC:  Chief Complaint  Patient presents with   Hospitalization Follow-up     HPI Mercedes Webb presents for hospital follow-up. Patient was admitted for DKA. Patient reports slowly feeling better, continues to feel tired and reports left hand numbness since her hospitalization. Checking sugars at home with readings in the 200-300 range. Patient reports did not even start new medication Rybelsus because it was too expensive. Has been more diligent on monitoring carbohydrate and sugar intake.   Discharge summary: Admit date: 01/13/2021 Discharge date: 01/18/2021   Admitted From: Home Disposition:  Home   Discharge Condition:Stable CODE STATUS:FULL Diet recommendation:  Carb Modified   Brief/Interim Summary: Patient is a 65 year old female with history of diabetes type 2, hypertension, hyperlipidemia, occasional alcohol use who presented with weakness.  She was also found unresponsive by her friend on couch and called EMS.  On presentation she was alert and oriented, complaining of abdominal pain.  She was hypotensive, hypothermic, tachypneic, confused.  She was admitted under PCCM service.  After admission, hospital course was remarkable for agitation requiring Precedex drip.  She was also found to be in DKA/pancreatitis.  Started on DKA protocol.  Currently she is hemodynamically stable and she was transferred to our service on 01/17/21.  Mental status has improved and she is currently alert and oriented.  PT/OT evaluated her and did not recommend follow-up.  She is medically stable for discharge to home today.   Following problems were addressed during her hospitalization:   DKA: Hemoglobin A1c in the range of 13.  Diabetic coordinator following and recommending 70/30 insulin on DC.  She takes metformin, Rybelsus/semaglutide at home.  Continue  metformin.  Semaglutide recommended to be discontinued   Acute pancreatitis/abdominal pain: Elevated lipase..  Abdomen CT/pelvis showed a small amount of bowel thickening of distal ileum,. She has history of pancreatitis in the past.  This time it was thought to be secondary to semaglutide.  No radiographic evidence of pancreatitis on CT imaging.  Abdomen is benign and she does not have any abdominal pain, nausea or vomiting.  She is tolerating advance diet   Altered mental status: CT head on presentation was negative for acute intracranial abnormalities.  .  Required Precedex drip on admission for agitation, now weaned off.  Mental status has improved.  Currently alert and oriented   SIRS versus sepsis: Patient is afebrile, no leukocytosis.  Blood cultures showed staph capitis  on anaerobic bottle.  Most likely contamination.  She was on broad-spectrum antibiotics, repeat blood cultures have been sent, no growth till date.  We stopped antibiotics.   Electrolyte abnormalities: Persistent hypokalemia.  Continue supplementation at home   Hypertension: Resume home medications   AKI: Resolved with IV fluids.   Hyperlipidemia: On Lipitor   History of anxiety: On Effexor.   Debility/deconditioning: No follow-up recommended after PT/OT evaluation.  Patient lives alone.  Ambulates without any problem at baseline.       Discharge Diagnoses:  Active Problems:   Pancreatitis   DKA (diabetic ketoacidosis) [facility])       Discharge Instructions   Discharge Instructions       Diet Carb Modified   Complete by: As directed      Discharge instructions   Complete by: As directed      1)please take prescribed medications as instructed 2)Follow up with your PCP  in a week 3)Closely monitor blood pressure at home.    Increase activity slowly   Complete by: As directed          Past Medical History:  Diagnosis Date   Anxiety    Cancer ([place]) 2016   Skin CA squamous cell on bilateral feet  removed   Cervical dysplasia    h/o   Colon polyps    polyps on first scope ~ 2006, no recurrent polyps ~ 2001 and in 2016.    Diabetes mellitus without complication ([place])    Endometriosis    Family history of adverse reaction to anesthesia    Mother had difficulty waking & nausea   Headache    Hypertension    Insomnia    Pancreatitis 01/2016   Recurrent cold sores     Past Surgical History:  Procedure Laterality Date   ABDOMINAL HYSTERECTOMY     tah/bso- endometriosis   CHOLECYSTECTOMY     ERCP N/A 02/03/2016   Procedure: ENDOSCOPIC RETROGRADE CHOLANGIOPANCREATOGRAPHY (ERCP);  Surgeon: Carol Ada, MD;  Location: [facility] ENDOSCOPY;  Service: Endoscopy;  Laterality: N/A;   KNEE ARTHROSCOPY     tonsillectomy     TONSILLECTOMY     TUBAL LIGATION      Family History  Problem Relation Age of Onset   Diabetes Father    Heart disease Father    Breast cancer Sister 24   Diabetes Brother    Colon cancer Neg Hx    Ovarian cancer Neg Hx     Social History   Socioeconomic History   Marital status: Divorced    Spouse name: Not on file   Number of children: Not on file   Years of education: Not on file   Highest education level: Not on file  Occupational History   Not on file  Tobacco Use   Smoking status: Former    Types: Cigarettes    Quit date: 04/30/1988    Years since quitting: 32.7   Smokeless tobacco: Never  Vaping Use   Vaping Use: Never used  Substance and Sexual Activity   Alcohol use: Yes    Comment: occas   Drug use: No   Sexual activity: Yes    Birth control/protection: Surgical  Other Topics Concern   Not on file  Social History Narrative   Not on file   Social Determinants of Health   Financial Resource Strain: Not on file  Food Insecurity: Not on file  Transportation Needs: Not on file  Physical Activity: Not on file  Stress: Not on file  Social Connections: Not on file  Intimate Partner Violence: Not on file    Outpatient Medications Prior  to Visit  Medication Sig Dispense Refill   Ascorbic Acid (VITAMIN C) 1000 MG tablet Take 1,000 mg by mouth daily.     atorvastatin (LIPITOR) 10 MG tablet TAKE 1 TABLET BY MOUTH NIGHTLY. 90 tablet 0   blood glucose meter kit and supplies Dispense based on patient and insurance preference. Use to check fasting blood sugar once daily. 1 each 0   Calcium Carbonate (CALTRATE 600 PO) Take 600 mg by mouth daily.     Insulin Pen Needle (PEN NEEDLES 3/16") 31G X 5 MM MISC 1 each by Does not apply route 3 (three) times daily. 100 each 1   losartan (COZAAR) 25 MG tablet TAKE 1 TABLET BY MOUTH DAILY 90 tablet 0   metFORMIN (GLUCOPHAGE) 1000 MG tablet TAKE 1 TABLET BY MOUTH EVERY 12 HOURS WITH MEALS (  Patient taking differently: Take 1,000 mg by mouth See admin instructions. Take one tablet (1000 mg) by mouth every 12 hours with meals) 180 tablet 0   Multiple Vitamin (MULTIVITAMIN WITH MINERALS) TABS tablet Take 1 tablet by mouth daily. Centrum Silver     omega-3 acid ethyl esters (LOVAZA) 1 g capsule Take 2 capsules (2 g total) by mouth 2 (two) times daily. (Patient not taking: Reported on 01/15/2021) 180 capsule 3   potassium chloride SA (KLOR-CON) 20 MEQ tablet Take 1 tablet (20 mEq total) by mouth daily. Take 2 pills daily for 5 days then continue taking 1 pill daily 30 tablet 1   QC LO-DOSE ASPIRIN 81 MG EC tablet TAKE 1 TABLET (81 MG TOTAL) BY MOUTH DAILY. 90 tablet 3   triamterene-hydrochlorothiazide (MAXZIDE-25) 37.5-25 MG tablet TAKE 1 TABLET BY MOUTH DAILY. 90 tablet 0   valACYclovir (VALTREX) 500 MG tablet TAKE 1 TABLET BY MOUTH DAILY. 90 tablet 0   venlafaxine XR (EFFEXOR-XR) 150 MG 24 hr capsule Take 150 mg by mouth every morning.     zolpidem (AMBIEN CR) 12.5 MG CR tablet Take 12.5 mg by mouth at bedtime.     insulin isophane & regular human KwikPen (NOVOLIN 70/30 KWIKPEN) (70-30) 100 UNIT/ML KwikPen Inject 10 Units into the skin 2 (two) times daily. 15 mL 0   Vitamin D, Ergocalciferol, (DRISDOL)  1.25 MG (50000 UNIT) CAPS capsule TAKE 1 CAPSULE BY MOUTH EVERY 7 DAYS. (Patient not taking: No sig reported) 12 capsule 0   No facility-administered medications prior to visit.    Allergies  Allergen Reactions   Prozac [Fluoxetine Hcl] Other (See Comments)    sweating    ROS Review of Systems Review of Systems:  A fourteen system review of systems was performed and found to be positive as per HPI.   Objective:    Physical Exam General:  Well Developed, well nourished, appropriate for stated age.  Neuro:  Alert and oriented,  extra-ocular muscles intact  HEENT:  Normocephalic, atraumatic, neck supple Skin:  no gross rash, warm, pink. Cardiac:  RRR, S1 S2, no murmur  Respiratory:  CTA B/L w/o wheezing, crackles or rales, Not using accessory muscles, speaking in full sentences- unlabored. Vascular:  Ext warm, no cyanosis apprec.; cap RF less 2 sec. Psych:  No HI/SI, judgement and insight good, Euthymic mood. Full Affect.  BP 110/64   Pulse (!) 102   Temp 98.6 F (37 C)   Ht 5' 7"  (1.702 m)   Wt 162 lb 6.4 oz (73.7 kg)   SpO2 98%   BMI 25.44 kg/m  Wt Readings from Last 3 Encounters:  01/30/21 162 lb 6.4 oz (73.7 kg)  01/13/21 170 lb 10.2 oz (77.4 kg)  01/09/21 168 lb (76.2 kg)     Health Maintenance Due  Topic Date Due   OPHTHALMOLOGY EXAM  Never done   Hepatitis C Screening  Never done   TETANUS/TDAP  Never done   Zoster Vaccines- Shingrix (1 of 2) Never done   DEXA SCAN  Never done   MAMMOGRAM  11/22/2019   COVID-19 Vaccine (4 - Booster for Pfizer series) 07/08/2020   INFLUENZA VACCINE  11/28/2020    There are no preventive care reminders to display for this patient.  Lab Results  Component Value Date   TSH 3.700 05/05/2020   Lab Results  Component Value Date   WBC 5.7 01/17/2021   HGB 12.5 01/17/2021   HCT 36.3 01/17/2021   MCV 89.2 01/17/2021  PLT 158 01/17/2021   Lab Results  Component Value Date   NA 139 01/18/2021   K 2.9 (L) 01/18/2021    CO2 21 (L) 01/18/2021   GLUCOSE 248 (H) 01/18/2021   BUN 7 (L) 01/18/2021   CREATININE 0.72 01/18/2021   BILITOT 0.6 01/14/2021   ALKPHOS 77 01/14/2021   AST 37 01/14/2021   ALT 26 01/14/2021   PROT 5.5 (L) 01/14/2021   ALBUMIN 2.6 (L) 01/14/2021   CALCIUM 8.9 01/18/2021   ANIONGAP 15 01/18/2021   EGFR 53 (L) 01/09/2021   Lab Results  Component Value Date   CHOL 147 05/05/2020   Lab Results  Component Value Date   HDL 41 05/05/2020   Lab Results  Component Value Date   LDLCALC 74 05/05/2020   Lab Results  Component Value Date   TRIG 191 (H) 05/05/2020   Lab Results  Component Value Date   CHOLHDL 3.6 05/05/2020   Lab Results  Component Value Date   HGBA1C 13.6 (H) 01/13/2021      Assessment & Plan:   Problem List Items Addressed This Visit       Cardiovascular and Mediastinum   Hypertension associated with diabetes ([place])   Relevant Medications   insulin isophane & regular human KwikPen (NOVOLIN 70/30 KWIKPEN) (70-30) 100 UNIT/ML KwikPen     Digestive   Pancreatitis     Endocrine   Mixed diabetic hyperlipidemia associated with type 2 diabetes mellitus (HCC)   Relevant Medications   insulin isophane & regular human KwikPen (NOVOLIN 70/30 KWIKPEN) (70-30) 100 UNIT/ML KwikPen   Diabetes mellitus (HCC)   Relevant Medications   Accu-Chek Softclix Lancets lancets   glucose blood test strip   insulin isophane & regular human KwikPen (NOVOLIN 70/30 KWIKPEN) (70-30) 100 UNIT/ML KwikPen   Other Relevant Orders   CBC w/Diff   Comp Met (CMET)   DKA (diabetic ketoacidosis) (HCC)   Relevant Medications   insulin isophane & regular human KwikPen (NOVOLIN 70/30 KWIKPEN) (70-30) 100 UNIT/ML KwikPen     Other   Hypokalemia   Relevant Orders   Comp Met (CMET)   Vitamin D deficiency   Relevant Orders   Vitamin D (25 hydroxy)   Other Visit Diagnoses     Hospital discharge follow-up    -  [facility] discharge follow-up, DKA: -Reviewed hospital  notes, labs and imaging studies. -Will repeat CMP to monitor hypokalemia. Recommend to continue potassium supplement.  -Will collect CBC to evaluate for leukocytosis, treated with antibiotics for staph capitis, thought to be a contaminated specimen. Blood culture 01/16/2021 revealed no growth 5 days. -Will adjust insulin by increasing two units with largest meal (dinner) and continue 10 units with morning meal. New rx sent. -Continue ambulatory glucose monitoring and keep a log to bring to follow-up visit. Advised patient if PPS consistently >300 then recommend increasing insulin to 12 units twice daily. Patient verbalized understanding. Continue Metformin. -Discussed low carbohydrate and glucose diet. -Discussed left hand numbness possibly related to neuropathy or trauma. Recommend to monitor symptoms. -Follow up in 8 weeks.  Pancreatitis: -Asymptomatic. -Patient reports did not pick up semaglutide because medication was too expensive and could not afford it so less likely medication contributing to acute pancreatitis. -CT abdomen pelvis 01/15/2021: IMPRESSION: 1. Small amount of free fluid in the right lower quadrant of the abdomen and concern for wall thickening involving the distal ileum. Findings are concerning for enteritis but nonspecific. 2. Small bilateral pleural effusions with subcutaneous edema,  presacral edema and a small amount of ascites. Findings may be associated with anasarca. 3. Hepatic steatosis.  Hypertension associated with diabetes: -Soft blood pressure in the office with mild tachycardia. Discussed adequate hydration. Patient will monitor BP and pulse at home and keep a log to bring at follow up visit. If BP consistently <110/60 recommend medication adjustments by decreasing triamterene-HCTZ. Will collect CMP to monitor renal function and electrolytes.  Mixed diabetic hyperlipidemia associated with type 2 diabetes mellitus: -Continue current medication regimen. -Will  continue to monitor.  Vitamin D deficiency: -Last Vitamin D 101 05/05/2020, high dose vitamin D was discontinued then. Will repeat Vitamin D.  Meds ordered this encounter  Medications   Accu-Chek Softclix Lancets lancets    Sig: Check sugars four times daily.    Dispense:  200 each    Refill:  6    Order Specific Question:   Supervising Provider    Answer:   Beatrice Lecher D [[id number]]   glucose blood test strip    Sig: Check sugars four times daily.    Dispense:  200 each    Refill:  6    Order Specific Question:   Supervising Provider    Answer:   Beatrice Lecher D [[id number]]   insulin isophane & regular human KwikPen (NOVOLIN 70/30 KWIKPEN) (70-30) 100 UNIT/ML KwikPen    Sig: Inject 10 units into the skin in the morning and inject 12 units into the skin before dinner.    Dispense:  15 mL    Refill:  2    Order Specific Question:   Supervising Provider    Answer:   Beatrice Lecher D [[id number]]    Follow-up: Return in about 8 weeks (around 03/27/2021) for DM, HTN.    Lorrene Reid, PA-C

## 2021-01-31 ENCOUNTER — Other Ambulatory Visit: Payer: Medicare Other

## 2021-01-31 DIAGNOSIS — E559 Vitamin D deficiency, unspecified: Secondary | ICD-10-CM

## 2021-01-31 DIAGNOSIS — E876 Hypokalemia: Secondary | ICD-10-CM

## 2021-01-31 DIAGNOSIS — E119 Type 2 diabetes mellitus without complications: Secondary | ICD-10-CM

## 2021-01-31 NOTE — Addendum Note (Signed)
Addended by: Vivia Birmingham on: 01/31/2021 08:22 AM   Modules accepted: Orders

## 2021-02-01 ENCOUNTER — Other Ambulatory Visit: Payer: Self-pay

## 2021-02-01 ENCOUNTER — Telehealth: Payer: Self-pay | Admitting: Physician Assistant

## 2021-02-01 DIAGNOSIS — E1169 Type 2 diabetes mellitus with other specified complication: Secondary | ICD-10-CM

## 2021-02-01 LAB — COMPREHENSIVE METABOLIC PANEL
ALT: 34 IU/L — ABNORMAL HIGH (ref 0–32)
AST: 41 IU/L — ABNORMAL HIGH (ref 0–40)
Albumin/Globulin Ratio: 1.5 (ref 1.2–2.2)
Albumin: 4.4 g/dL (ref 3.8–4.8)
Alkaline Phosphatase: 121 IU/L (ref 44–121)
BUN/Creatinine Ratio: 21 (ref 12–28)
BUN: 16 mg/dL (ref 8–27)
Bilirubin Total: 0.6 mg/dL (ref 0.0–1.2)
CO2: 21 mmol/L (ref 20–29)
Calcium: 10 mg/dL (ref 8.7–10.3)
Chloride: 96 mmol/L (ref 96–106)
Creatinine, Ser: 0.78 mg/dL (ref 0.57–1.00)
Globulin, Total: 3 g/dL (ref 1.5–4.5)
Glucose: 398 mg/dL — ABNORMAL HIGH (ref 70–99)
Potassium: 5.1 mmol/L (ref 3.5–5.2)
Sodium: 135 mmol/L (ref 134–144)
Total Protein: 7.4 g/dL (ref 6.0–8.5)
eGFR: 84 mL/min/{1.73_m2} (ref >59)

## 2021-02-01 LAB — CBC WITH DIFFERENTIAL/PLATELET
Basophils Absolute: 0 10*3/uL (ref 0.0–0.2)
Basos: 1 %
EOS (ABSOLUTE): 0 10*3/uL (ref 0.0–0.4)
Eos: 0 %
Hematocrit: 38.4 % (ref 34.0–46.6)
Hemoglobin: 12.7 g/dL (ref 11.1–15.9)
Immature Grans (Abs): 0 10*3/uL (ref 0.0–0.1)
Immature Granulocytes: 0 %
Lymphocytes Absolute: 1.2 10*3/uL (ref 0.7–3.1)
Lymphs: 23 %
MCH: 31.1 pg (ref 26.6–33.0)
MCHC: 33.1 g/dL (ref 31.5–35.7)
MCV: 94 fL (ref 79–97)
Monocytes Absolute: 0.4 10*3/uL (ref 0.1–0.9)
Monocytes: 7 %
Neutrophils Absolute: 3.6 10*3/uL (ref 1.4–7.0)
Neutrophils: 69 %
Platelets: 298 10*3/uL (ref 150–450)
RBC: 4.09 x10E6/uL (ref 3.77–5.28)
RDW: 15.3 % (ref 11.7–15.4)
WBC: 5.2 10*3/uL (ref 3.4–10.8)

## 2021-02-01 LAB — VITAMIN D 25 HYDROXY (VIT D DEFICIENCY, FRACTURES): Vit D, 25-Hydroxy: 38.4 ng/mL (ref 30.0–100.0)

## 2021-02-01 MED ORDER — GLUCOSE BLOOD VI STRP
ORAL_STRIP | 6 refills | Status: DC
Start: 2021-02-01 — End: 2021-02-07

## 2021-02-01 NOTE — Telephone Encounter (Signed)
Rx was resent to [facility]

## 2021-02-01 NOTE — Telephone Encounter (Signed)
Patient called into office to let Herb Grays know [facility] did not received the prescription for her strips on 01/30/21.  I called [facility] and confirmed they did not receive the prescription.  Can you resend the prescription again?

## 2021-02-07 ENCOUNTER — Other Ambulatory Visit: Payer: Self-pay | Admitting: Physician Assistant

## 2021-02-07 DIAGNOSIS — E1169 Type 2 diabetes mellitus with other specified complication: Secondary | ICD-10-CM

## 2021-02-07 MED ORDER — GLUCOSE BLOOD VI STRP: ORAL_STRIP | 6 refills | Status: DC

## 2021-02-23 ENCOUNTER — Other Ambulatory Visit: Payer: Self-pay

## 2021-02-23 ENCOUNTER — Encounter: Payer: Self-pay | Admitting: Physician Assistant

## 2021-02-23 ENCOUNTER — Ambulatory Visit (INDEPENDENT_AMBULATORY_CARE_PROVIDER_SITE_OTHER): Payer: Medicare Other | Admitting: Physician Assistant

## 2021-02-23 VITALS — BP 102/63 | HR 95 | Temp 97.9°F | Ht 67.0 in | Wt 166.2 lb

## 2021-02-23 DIAGNOSIS — Z23 Encounter for immunization: Secondary | ICD-10-CM | POA: Diagnosis not present

## 2021-02-23 NOTE — Progress Notes (Signed)
Patient is here for her influenza vaccination   No allergic reaction.  Pt tolerated the injection. KM, RMA   Noted. MA, PA-C

## 2021-03-14 ENCOUNTER — Other Ambulatory Visit: Payer: Self-pay

## 2021-03-14 DIAGNOSIS — E1110 Type 2 diabetes mellitus with ketoacidosis without coma: Secondary | ICD-10-CM

## 2021-03-14 DIAGNOSIS — E876 Hypokalemia: Secondary | ICD-10-CM

## 2021-03-16 ENCOUNTER — Telehealth: Payer: Self-pay | Admitting: Physician Assistant

## 2021-03-16 NOTE — Telephone Encounter (Signed)
Patient requested a refill of potassium. Patient CMP last checked 01/31/21 potassium was at 5.1. Range is 3.5-5.2. Per Herb Grays advised patient we would not refill until rechecking labs at her upcoming apt. Pt verbalized understanding and was agreeable. AS, CMA

## 2021-03-29 NOTE — Addendum Note (Signed)
Addended by: Mickel Crow on: 03/29/2021 02:17 PM   Modules accepted: Orders

## 2021-04-05 ENCOUNTER — Ambulatory Visit (INDEPENDENT_AMBULATORY_CARE_PROVIDER_SITE_OTHER): Payer: Medicare Other | Admitting: Physician Assistant

## 2021-04-05 ENCOUNTER — Encounter: Payer: Self-pay | Admitting: Physician Assistant

## 2021-04-05 ENCOUNTER — Other Ambulatory Visit: Payer: Self-pay

## 2021-04-05 VITALS — BP 115/64 | HR 86 | Temp 98.6°F | Ht 67.0 in | Wt 173.1 lb

## 2021-04-05 DIAGNOSIS — I152 Hypertension secondary to endocrine disorders: Secondary | ICD-10-CM

## 2021-04-05 DIAGNOSIS — E1169 Type 2 diabetes mellitus with other specified complication: Secondary | ICD-10-CM

## 2021-04-05 DIAGNOSIS — E1159 Type 2 diabetes mellitus with other circulatory complications: Secondary | ICD-10-CM

## 2021-04-05 MED ORDER — "PEN NEEDLES 3/16"" 31G X 5 MM MISC": 1.0000 | Freq: Three times a day (TID) | 1 refills | Status: DC

## 2021-04-05 NOTE — Assessment & Plan Note (Signed)
-  A1c has improved to 5.9, will continue current medication regimen. Discussed to monitor for hypoglycemia and advised to let me know if consistently having glucose readings of <70 for medication adjustments. Patient verbalized understanding. -Continue with low carbohydrate and glucose diet. -Will continue to monitor.

## 2021-04-05 NOTE — Patient Instructions (Signed)
Managing Your Hypertension Hypertension, also called high blood pressure, is when the force of the blood pressing against the walls of the arteries is too strong. Arteries are blood vessels that carry blood from your heart throughout your body. Hypertension forces the heart to work harder to pump blood and may cause the arteries to become narrow or stiff. Understanding blood pressure readings Your personal target blood pressure may vary depending on your medical conditions, your age, and other factors. A blood pressure reading includes a higher number over a lower number. Ideally, your blood pressure should be below 120/80. You should know that: The first, or top, number is called the systolic pressure. It is a measure of the pressure in your arteries as your heart beats. The second, or bottom number, is called the diastolic pressure. It is a measure of the pressure in your arteries as the heart relaxes. Blood pressure is classified into four stages. Based on your blood pressure reading, your health care provider may use the following stages to determine what type of treatment you need, if any. Systolic pressure and diastolic pressure are measured in a unit called mmHg. Normal Systolic pressure: below 120. Diastolic pressure: below 80. Elevated Systolic pressure: 120-129. Diastolic pressure: below 80. Hypertension stage 1 Systolic pressure: 130-139. Diastolic pressure: 80-89. Hypertension stage 2 Systolic pressure: 140 or above. Diastolic pressure: 90 or above. How can this condition affect me? Managing your hypertension is an important responsibility. Over time, hypertension can damage the arteries and decrease blood flow to important parts of the body, including the brain, heart, and kidneys. Having untreated or uncontrolled hypertension can lead to: A heart attack. A stroke. A weakened blood vessel (aneurysm). Heart failure. Kidney damage. Eye damage. Metabolic syndrome. Memory and  concentration problems. Vascular dementia. What actions can I take to manage this condition? Hypertension can be managed by making lifestyle changes and possibly by taking medicines. Your health care provider will help you make a plan to bring your blood pressure within a normal range. Nutrition  Eat a diet that is high in fiber and potassium, and low in salt (sodium), added sugar, and fat. An example eating plan is called the Dietary Approaches to Stop Hypertension (DASH) diet. To eat this way: Eat plenty of fresh fruits and vegetables. Try to fill one-half of your plate at each meal with fruits and vegetables. Eat whole grains, such as whole-wheat pasta, brown rice, or whole-grain bread. Fill about one-fourth of your plate with whole grains. Eat low-fat dairy products. Avoid fatty cuts of meat, processed or cured meats, and poultry with skin. Fill about one-fourth of your plate with lean proteins such as fish, chicken without skin, beans, eggs, and tofu. Avoid pre-made and processed foods. These tend to be higher in sodium, added sugar, and fat. Reduce your daily sodium intake. Most people with hypertension should eat less than 1,500 mg of sodium a day. Lifestyle  Work with your health care provider to maintain a healthy body weight or to lose weight. Ask what an ideal weight is for you. Get at least 30 minutes of exercise that causes your heart to beat faster (aerobic exercise) most days of the week. Activities may include walking, swimming, or biking. Include exercise to strengthen your muscles (resistance exercise), such as weight lifting, as part of your weekly exercise routine. Try to do these types of exercises for 30 minutes at least 3 days a week. Do not use any products that contain nicotine or tobacco, such as cigarettes, e-cigarettes,   and chewing tobacco. If you need help quitting, ask your health care provider. Control any long-term (chronic) conditions you have, such as high  cholesterol or diabetes. Identify your sources of stress and find ways to manage stress. This may include meditation, deep breathing, or making time for fun activities. Alcohol use Do not drink alcohol if: Your health care provider tells you not to drink. You are pregnant, may be pregnant, or are planning to become pregnant. If you drink alcohol: Limit how much you use to: 0-1 drink a day for women. 0-2 drinks a day for men. Be aware of how much alcohol is in your drink. In the [place] one drink equals one 12 oz bottle of beer (355 mL), one 5 oz glass of wine (148 mL), or one 1 oz glass of hard liquor (44 mL). Medicines Your health care provider may prescribe medicine if lifestyle changes are not enough to get your blood pressure under control and if: Your systolic blood pressure is 130 or higher. Your diastolic blood pressure is 80 or higher. Take medicines only as told by your health care provider. Follow the directions carefully. Blood pressure medicines must be taken as told by your health care provider. The medicine does not work as well when you skip doses. Skipping doses also puts you at risk for problems. Monitoring Before you monitor your blood pressure: Do not smoke, drink caffeinated beverages, or exercise within 30 minutes before taking a measurement. Use the bathroom and empty your bladder (urinate). Sit quietly for at least 5 minutes before taking measurements. Monitor your blood pressure at home as told by your health care provider. To do this: Sit with your back straight and supported. Place your feet flat on the floor. Do not cross your legs. Support your arm on a flat surface, such as a table. Make sure your upper arm is at heart level. Each time you measure, take two or three readings one minute apart and record the results. You may also need to have your blood pressure checked regularly by your health care provider. General information Talk with your health care  provider about your diet, exercise habits, and other lifestyle factors that may be contributing to hypertension. Review all the medicines you take with your health care provider because there may be side effects or interactions. Keep all visits as told by your health care provider. Your health care provider can help you create and adjust your plan for managing your high blood pressure. Where to find more information National Heart, Lung, and Blood Institute: www.nhlbi.nih.gov American Heart Association: www.heart.org Contact a health care provider if: You think you are having a reaction to medicines you have taken. You have repeated (recurrent) headaches. You feel dizzy. You have swelling in your ankles. You have trouble with your vision. Get help right away if: You develop a severe headache or confusion. You have unusual weakness or numbness, or you feel faint. You have severe pain in your chest or abdomen. You vomit repeatedly. You have trouble breathing. These symptoms may represent a serious problem that is an emergency. Do not wait to see if the symptoms will go away. Get medical help right away. Call your local emergency services (911 in the [place] Do not drive yourself to the hospital. Summary Hypertension is when the force of blood pumping through your arteries is too strong. If this condition is not controlled, it may put you at risk for serious complications. Your personal target blood pressure may vary depending on   your medical conditions, your age, and other factors. For most people, a normal blood pressure is less than 120/80. Hypertension is managed by lifestyle changes, medicines, or both. Lifestyle changes to help manage hypertension include losing weight, eating a healthy, low-sodium diet, exercising more, stopping smoking, and limiting alcohol. This information is not intended to replace advice given to you by your health care provider. Make sure you discuss any questions  you have with your health care provider. Document Revised: 05/04/2019 Document Reviewed: 03/17/2019 Elsevier Patient Education  2022 [employer]  

## 2021-04-05 NOTE — Progress Notes (Signed)
Established Patient Office Visit  Subjective:  Patient ID: FAE BLOSSOM, female    DOB: November 13, 1955  Age: 65 y.o. MRN: [id number]  CC:  Chief Complaint  Patient presents with   Hypertension        Hyperlipidemia   Diabetes     HPI Mercedes Webb presents for follow up on diabetes mellitus and hypertension.  Diabetes: Pt denies increased urination or thirst. Feeling much better. Pt reports medication compliance. No hypoglycemic events. Checking glucose at home. PPS <200 and FBS usually range <160, lowest reading 97. Patient has made significant dietary changes and reduced carbohydrates such as bread and sweets. Mostly drinks water.  HTN: Pt denies chest pain, palpitations, dizziness or leg swelling. Taking medication as directed without side effects. Checks BP at home and readings range in 110s-130s/70-80s. Pt follows a low salt diet.   Past Medical History:  Diagnosis Date   Anxiety    Cancer ([place]) 2016   Skin CA squamous cell on bilateral feet removed   Cervical dysplasia    h/o   Colon polyps    polyps on first scope ~ 2006, no recurrent polyps ~ 2001 and in 2016.    Diabetes mellitus without complication ([place])    Endometriosis    Family history of adverse reaction to anesthesia    Mother had difficulty waking & nausea   Headache    Hypertension    Insomnia    Pancreatitis 01/2016   Recurrent cold sores     Past Surgical History:  Procedure Laterality Date   ABDOMINAL HYSTERECTOMY     tah/bso- endometriosis   CHOLECYSTECTOMY     ERCP N/A 02/03/2016   Procedure: ENDOSCOPIC RETROGRADE CHOLANGIOPANCREATOGRAPHY (ERCP);  Surgeon: Carol Ada, MD;  Location: [facility] ENDOSCOPY;  Service: Endoscopy;  Laterality: N/A;   KNEE ARTHROSCOPY     tonsillectomy     TONSILLECTOMY     TUBAL LIGATION      Family History  Problem Relation Age of Onset   Diabetes Father    Heart disease Father    Breast cancer Sister 102   Diabetes Brother    Colon cancer Neg Hx     Ovarian cancer Neg Hx     Social History   Socioeconomic History   Marital status: Divorced    Spouse name: Not on file   Number of children: Not on file   Years of education: Not on file   Highest education level: Not on file  Occupational History   Not on file  Tobacco Use   Smoking status: Former    Types: Cigarettes    Quit date: 04/30/1988    Years since quitting: 32.9   Smokeless tobacco: Never  Vaping Use   Vaping Use: Never used  Substance and Sexual Activity   Alcohol use: Yes    Comment: occas   Drug use: No   Sexual activity: Yes    Birth control/protection: Surgical  Other Topics Concern   Not on file  Social History Narrative   Not on file   Social Determinants of Health   Financial Resource Strain: Not on file  Food Insecurity: Not on file  Transportation Needs: Not on file  Physical Activity: Not on file  Stress: Not on file  Social Connections: Not on file  Intimate Partner Violence: Not on file    Outpatient Medications Prior to Visit  Medication Sig Dispense Refill   Accu-Chek Softclix Lancets lancets Check sugars four times daily. 200 each 6  Ascorbic Acid (VITAMIN C) 1000 MG tablet Take 1,000 mg by mouth daily.     atorvastatin (LIPITOR) 10 MG tablet TAKE 1 TABLET BY MOUTH NIGHTLY. 90 tablet 0   blood glucose meter kit and supplies Dispense based on patient and insurance preference. Use to check fasting blood sugar once daily. 1 each 0   Calcium Carbonate (CALTRATE 600 PO) Take 600 mg by mouth daily.     glucose blood test strip Check sugars four times daily. 200 each 6   insulin isophane & regular human KwikPen (NOVOLIN 70/30 KWIKPEN) (70-30) 100 UNIT/ML KwikPen Inject 10 units into the skin in the morning and inject 12 units into the skin before dinner. 15 mL 2   losartan (COZAAR) 25 MG tablet TAKE 1 TABLET BY MOUTH DAILY 90 tablet 0   Multiple Vitamin (MULTIVITAMIN WITH MINERALS) TABS tablet Take 1 tablet by mouth daily. Centrum Silver      omega-3 acid ethyl esters (LOVAZA) 1 g capsule Take 2 capsules (2 g total) by mouth 2 (two) times daily. 180 capsule 3   QC LO-DOSE ASPIRIN 81 MG EC tablet TAKE 1 TABLET (81 MG TOTAL) BY MOUTH DAILY. 90 tablet 3   triamterene-hydrochlorothiazide (MAXZIDE-25) 37.5-25 MG tablet TAKE 1 TABLET BY MOUTH DAILY. 90 tablet 0   valACYclovir (VALTREX) 500 MG tablet TAKE 1 TABLET BY MOUTH DAILY. 90 tablet 0   venlafaxine XR (EFFEXOR-XR) 150 MG 24 hr capsule Take 150 mg by mouth every morning.     zolpidem (AMBIEN CR) 12.5 MG CR tablet Take 12.5 mg by mouth at bedtime.     Insulin Pen Needle (PEN NEEDLES 3/16") 31G X 5 MM MISC 1 each by Does not apply route 3 (three) times daily. 100 each 1   metFORMIN (GLUCOPHAGE) 1000 MG tablet TAKE 1 TABLET BY MOUTH EVERY 12 HOURS WITH MEALS (Patient not taking: Reported on 04/05/2021) 180 tablet 0   potassium chloride SA (KLOR-CON) 20 MEQ tablet Take 1 tablet (20 mEq total) by mouth daily. Take 2 pills daily for 5 days then continue taking 1 pill daily 30 tablet 1   No facility-administered medications prior to visit.    Allergies  Allergen Reactions   Prozac [Fluoxetine Hcl] Other (See Comments)    sweating    ROS Review of Systems Review of Systems:  A fourteen system review of systems was performed and found to be positive as per HPI.   Objective:    Physical Exam General:  Well Developed, well nourished, appropriate for stated age.  Neuro:  Alert and oriented,  extra-ocular muscles intact  HEENT:  Normocephalic, atraumatic, neck supple Skin:  no gross rash, warm, pink. Cardiac:  RRR, S1 S2 Respiratory: CTA B/L, Not using accessory muscles, speaking in full sentences- unlabored. Vascular:  Ext warm, no cyanosis apprec.; cap RF less 2 sec. Psych:  No HI/SI, judgement and insight good, Euthymic mood. Full Affect.  BP 115/64   Pulse 86   Temp 98.6 F (37 C)   Ht 5' 7" (1.702 m)   Wt 173 lb 1.6 oz (78.5 kg)   SpO2 95%   BMI 27.11 kg/m  Wt Readings  from Last 3 Encounters:  04/05/21 173 lb 1.6 oz (78.5 kg)  02/23/21 166 lb 3.2 oz (75.4 kg)  01/30/21 162 lb 6.4 oz (73.7 kg)     Health Maintenance Due  Topic Date Due   OPHTHALMOLOGY EXAM  Never done   Hepatitis C Screening  Never done   TETANUS/TDAP  Never done  Zoster Vaccines- Shingrix (1 of 2) Never done   DEXA SCAN  Never done   MAMMOGRAM  11/22/2019   COVID-19 Vaccine (4 - Booster for Pfizer series) 06/10/2020    There are no preventive care reminders to display for this patient.  Lab Results  Component Value Date   TSH 3.700 05/05/2020   Lab Results  Component Value Date   WBC 5.2 01/31/2021   HGB 12.7 01/31/2021   HCT 38.4 01/31/2021   MCV 94 01/31/2021   PLT 298 01/31/2021   Lab Results  Component Value Date   NA 135 01/31/2021   K 5.1 01/31/2021   CO2 21 01/31/2021   GLUCOSE 398 (H) 01/31/2021   BUN 16 01/31/2021   CREATININE 0.78 01/31/2021   BILITOT 0.6 01/31/2021   ALKPHOS 121 01/31/2021   AST 41 (H) 01/31/2021   ALT 34 (H) 01/31/2021   PROT 7.4 01/31/2021   ALBUMIN 4.4 01/31/2021   CALCIUM 10.0 01/31/2021   ANIONGAP 15 01/18/2021   EGFR 84 01/31/2021   Lab Results  Component Value Date   CHOL 147 05/05/2020   Lab Results  Component Value Date   HDL 41 05/05/2020   Lab Results  Component Value Date   LDLCALC 74 05/05/2020   Lab Results  Component Value Date   TRIG 191 (H) 05/05/2020   Lab Results  Component Value Date   CHOLHDL 3.6 05/05/2020   Lab Results  Component Value Date   HGBA1C 13.6 (H) 01/13/2021      Assessment & Plan:   Problem List Items Addressed This Visit       Cardiovascular and Mediastinum   Hypertension associated with diabetes ([place])    -BP has improved and stable. -Continue current medication regimen. Will collect CMP to monitor renal function and electrolytes including potassium.  -Will continue to monitor.      Relevant Orders   Comp Met (CMET)     Endocrine   Diabetes mellitus ([place]) -  Primary    -A1c has improved to 5.9, will continue current medication regimen. Discussed to monitor for hypoglycemia and advised to let me know if consistently having glucose readings of <70 for medication adjustments. Patient verbalized understanding. -Continue with low carbohydrate and glucose diet. -Will continue to monitor.      Relevant Medications   Insulin Pen Needle (PEN NEEDLES 3/16") 31G X 5 MM MISC    Meds ordered this encounter  Medications   Insulin Pen Needle (PEN NEEDLES 3/16") 31G X 5 MM MISC    Sig: 1 each by Does not apply route 3 (three) times daily.    Dispense:  100 each    Refill:  1    Order Specific Question:   Supervising Provider    Answer:   Beatrice Lecher D [[id number]]     Follow-up: Return in about 3 months (around 07/04/2021) for DM, HTN, HLD.    Lorrene Reid, PA-C

## 2021-04-05 NOTE — Assessment & Plan Note (Signed)
-  BP has improved and stable. -Continue current medication regimen. Will collect CMP to monitor renal function and electrolytes including potassium.  -Will continue to monitor.

## 2021-04-06 LAB — COMPREHENSIVE METABOLIC PANEL
ALT: 31 IU/L (ref 0–32)
AST: 27 IU/L (ref 0–40)
Albumin/Globulin Ratio: 1.6 (ref 1.2–2.2)
Albumin: 5.2 g/dL — ABNORMAL HIGH (ref 3.8–4.8)
Alkaline Phosphatase: 124 IU/L — ABNORMAL HIGH (ref 44–121)
BUN/Creatinine Ratio: 19 (ref 12–28)
BUN: 20 mg/dL (ref 8–27)
Bilirubin Total: 0.3 mg/dL (ref 0.0–1.2)
CO2: 23 mmol/L (ref 20–29)
Calcium: 10 mg/dL (ref 8.7–10.3)
Chloride: 96 mmol/L (ref 96–106)
Creatinine, Ser: 1.04 mg/dL — ABNORMAL HIGH (ref 0.57–1.00)
Globulin, Total: 3.2 g/dL (ref 1.5–4.5)
Glucose: 132 mg/dL — ABNORMAL HIGH (ref 70–99)
Potassium: 3.9 mmol/L (ref 3.5–5.2)
Sodium: 136 mmol/L (ref 134–144)
Total Protein: 8.4 g/dL (ref 6.0–8.5)
eGFR: 59 mL/min/{1.73_m2} — ABNORMAL LOW (ref >59)

## 2021-04-18 ENCOUNTER — Other Ambulatory Visit: Payer: Self-pay | Admitting: Physician Assistant

## 2021-04-18 DIAGNOSIS — E1159 Type 2 diabetes mellitus with other circulatory complications: Secondary | ICD-10-CM

## 2021-04-18 DIAGNOSIS — B009 Herpesviral infection, unspecified: Secondary | ICD-10-CM

## 2021-04-18 DIAGNOSIS — E782 Mixed hyperlipidemia: Secondary | ICD-10-CM

## 2021-04-18 DIAGNOSIS — I10 Essential (primary) hypertension: Secondary | ICD-10-CM

## 2021-04-18 DIAGNOSIS — E1129 Type 2 diabetes mellitus with other diabetic kidney complication: Secondary | ICD-10-CM

## 2021-07-10 ENCOUNTER — Other Ambulatory Visit: Payer: Self-pay

## 2021-07-10 ENCOUNTER — Ambulatory Visit (INDEPENDENT_AMBULATORY_CARE_PROVIDER_SITE_OTHER): Payer: Medicare Other | Admitting: Physician Assistant

## 2021-07-10 ENCOUNTER — Encounter: Payer: Self-pay | Admitting: Physician Assistant

## 2021-07-10 VITALS — BP 128/74 | HR 84 | Temp 98.6°F | Ht 67.0 in | Wt 182.0 lb

## 2021-07-10 DIAGNOSIS — I152 Hypertension secondary to endocrine disorders: Secondary | ICD-10-CM

## 2021-07-10 DIAGNOSIS — E1159 Type 2 diabetes mellitus with other circulatory complications: Secondary | ICD-10-CM

## 2021-07-10 DIAGNOSIS — E782 Mixed hyperlipidemia: Secondary | ICD-10-CM | POA: Diagnosis not present

## 2021-07-10 DIAGNOSIS — E1169 Type 2 diabetes mellitus with other specified complication: Secondary | ICD-10-CM

## 2021-07-10 LAB — POCT GLYCOSYLATED HEMOGLOBIN (HGB A1C): Hemoglobin A1C: 7.3 % — AB (ref 4.0–5.6)

## 2021-07-10 NOTE — Assessment & Plan Note (Signed)
-  BP initially elevated, BP repeated and stable. ?-Continue current medication regimen. See med list. ?-Will continue to monitor. ?

## 2021-07-10 NOTE — Progress Notes (Signed)
?Established patient visit ? ? ?Patient: Mercedes Webb   DOB: [birth year]   66 y.o. Female  MRN: [id number] ?Visit Date: 07/10/2021 ? ?Chief Complaint  ?Patient presents with  ? Follow-up  ? Diabetes  ? Hypertension  ? Hyperlipidemia  ? ?Subjective  ?  ?HPI  ?Patient presents for follow-up on diabetes mellitus, hypertension and hyperlipidemia.  ? ?Diabetes mellitus: Pt denies increased urination or thirst. Pt reports medication compliance. No hypoglycemic events. Checking glucose at home. FBS range from 140-180. States has not been as diligent with low carb/glucose diet. Has been eating more starches.  ? ?HTN: Pt denies chest pain, palpitations, shortness of breath, dizziness or lower extremity swelling. Taking medication as directed without side effects. Checks BP at home and readings range <135/85.  ? ?HLD: Pt taking medication as directed without issues. Tries to follow a balanced diet. ? ? ? ? ?Medications: ?Outpatient Medications Prior to Visit  ?Medication Sig  ? Accu-Chek Softclix Lancets lancets Check sugars four times daily.  ? Ascorbic Acid (VITAMIN C) 1000 MG tablet Take 1,000 mg by mouth daily.  ? atorvastatin (LIPITOR) 10 MG tablet TAKE 1 TABLET BY MOUTH NIGHTLY.  ? blood glucose meter kit and supplies Dispense based on patient and insurance preference. Use to check fasting blood sugar once daily.  ? Calcium Carbonate (CALTRATE 600 PO) Take 600 mg by mouth daily.  ? glucose blood test strip Check sugars four times daily.  ? insulin isophane & regular human KwikPen (NOVOLIN 70/30 KWIKPEN) (70-30) 100 UNIT/ML KwikPen Inject 10 units into the skin in the morning and inject 12 units into the skin before dinner.  ? Insulin Pen Needle (PEN NEEDLES 3/16") 31G X 5 MM MISC 1 each by Does not apply route 3 (three) times daily.  ? losartan (COZAAR) 25 MG tablet TAKE 1 TABLET BY MOUTH DAILY  ? Multiple Vitamin (MULTIVITAMIN WITH MINERALS) TABS tablet Take 1 tablet by mouth daily. Centrum Silver  ? omega-3 acid  ethyl esters (LOVAZA) 1 g capsule Take 2 capsules (2 g total) by mouth 2 (two) times daily.  ? QC LO-DOSE ASPIRIN 81 MG EC tablet TAKE 1 TABLET (81 MG TOTAL) BY MOUTH DAILY.  ? triamterene-hydrochlorothiazide (MAXZIDE-25) 37.5-25 MG tablet TAKE 1 TABLET BY MOUTH DAILY.  ? valACYclovir (VALTREX) 500 MG tablet TAKE 1 TABLET BY MOUTH DAILY.  ? venlafaxine XR (EFFEXOR-XR) 150 MG 24 hr capsule Take 150 mg by mouth every morning.  ? zolpidem (AMBIEN CR) 12.5 MG CR tablet Take 12.5 mg by mouth at bedtime.  ? ?No facility-administered medications prior to visit.  ? ? ?Review of Systems ?Review of Systems:  ?A fourteen system review of systems was performed and found to be positive as per HPI. ? ? ?  Objective  ?  ?BP 128/74   Pulse 84   Temp 98.6 ?F (37 ?C)   Ht _0  (1.702 m)   Wt 182 lb (82.6 kg)   SpO2 98%   BMI 28.51 kg/m?  ?BP Readings from Last 3 Encounters:  ?07/10/21 128/74  ?04/05/21 115/64  ?02/23/21 102/63  ? ?Wt Readings from Last 3 Encounters:  ?07/10/21 182 lb (82.6 kg)  ?04/05/21 173 lb 1.6 oz (78.5 kg)  ?02/23/21 166 lb 3.2 oz (75.4 kg)  ? ? ?Physical Exam  ?General:  Pleasant and cooperative, appropriate for stated age.  ?Neuro:  Alert and oriented,  extra-ocular muscles intact  ?HEENT:  Normocephalic, atraumatic, neck supple  ?Skin:  no gross rash, warm, pink. ?Cardiac:  RRR, S1 S2 ?Respiratory: CTA B/L  ?Vascular:  Ext warm, no cyanosis apprec.; cap RF less 2 sec. ?Psych:  No HI/SI, judgement and insight good, Euthymic mood. Full Affect. ? ? ?Results for orders placed or performed in visit on 07/10/21  ?POCT glycosylated hemoglobin (Hb A1C)  ?Result Value Ref Range  ? Hemoglobin A1C 7.3 (A) 4.0 - 5.6 %  ? HbA1c POC (<> result, manual entry)    ? HbA1c, POC (prediabetic range)    ? HbA1c, POC (controlled diabetic range)    ? ? Assessment & Plan  ?  ? ? ?Problem List Items Addressed This Visit   ? ?  ? Cardiovascular and Mediastinum  ? Hypertension associated with diabetes ([place])  ?  -BP initially  elevated, BP repeated and stable. ?-Continue current medication regimen. See med list. ?-Will continue to monitor. ?  ?  ?  ? Endocrine  ? Mixed diabetic hyperlipidemia associated with type 2 diabetes mellitus ([place])  ?  -Last LDL 79, will repeat lipid panel and hepatic function with [facility]. Continue atorvastatin 10 mg. If LDL remains above goal of 70 then recommend increasing atorvastatin to 20 mg. Will continue to monitor. ?  ?  ? Diabetes mellitus ([place]) - Primary  ?  -A1c has improved from 13.6 to 7.3, will continue current medication regimen. See med list. Discussed with patient if A1c continues to improve and less than 7 then will consider medication adjustments. Pt verbalized understanding. Recommend to follow a diabetic diet and stay as active as possible. ?-Will continue to monitor. ?  ?  ? Relevant Orders  ? POCT glycosylated hemoglobin (Hb A1C) (Completed)  ? ? ?Return in about 3 months (around 10/10/2021) for [place] and [place].  ?   ? ? ? ?Lorrene Reid, PA-C  ?[facility] ?[phone] (phone) ?[phone] (fax) ? ?[facility] ?

## 2021-07-10 NOTE — Patient Instructions (Signed)

## 2021-07-10 NOTE — Assessment & Plan Note (Signed)
-  Last LDL 79, will repeat lipid panel and hepatic function with [facility]. Continue atorvastatin 10 mg. If LDL remains above goal of 70 then recommend increasing atorvastatin to 20 mg. Will continue to monitor. ?

## 2021-07-10 NOTE — Assessment & Plan Note (Signed)
-  A1c has improved from 13.6 to 7.3, will continue current medication regimen. See med list. Discussed with patient if A1c continues to improve and less than 7 then will consider medication adjustments. Pt verbalized understanding. Recommend to follow a diabetic diet and stay as active as possible. ?-Will continue to monitor. ?

## 2021-07-25 ENCOUNTER — Other Ambulatory Visit: Payer: Self-pay | Admitting: Physician Assistant

## 2021-07-25 DIAGNOSIS — E1169 Type 2 diabetes mellitus with other specified complication: Secondary | ICD-10-CM

## 2021-09-04 ENCOUNTER — Telehealth: Payer: Self-pay | Admitting: Physician Assistant

## 2021-09-04 DIAGNOSIS — E1169 Type 2 diabetes mellitus with other specified complication: Secondary | ICD-10-CM

## 2021-09-04 NOTE — Telephone Encounter (Signed)
Patient received a letter from her insurance company saying they would only cover a 30 day supply of the Novolin. Is there another medication she can start so she is not without diabetic medication? ?

## 2021-09-05 NOTE — Telephone Encounter (Signed)
Patient is requesting to speak with you if you can please call her back at [phone] ?

## 2021-09-05 NOTE — Telephone Encounter (Signed)
Patient is aware of the below and verbalized understanding. AS, CMA 

## 2021-09-05 NOTE — Telephone Encounter (Signed)
Patient contact insurance company and they suggested Humalin 70/30 ? ?[facility] on [address] ?

## 2021-09-06 MED ORDER — HUMULIN 70/30 KWIKPEN (70-30) 100 UNIT/ML ~~LOC~~ SUPN: PEN_INJECTOR | SUBCUTANEOUS | 2 refills | Status: DC

## 2021-09-06 NOTE — Telephone Encounter (Signed)
Patient is aware. AS, CMA ?

## 2021-10-05 ENCOUNTER — Other Ambulatory Visit: Payer: Self-pay | Admitting: Physician Assistant

## 2021-10-05 DIAGNOSIS — I10 Essential (primary) hypertension: Secondary | ICD-10-CM

## 2021-10-11 ENCOUNTER — Ambulatory Visit (INDEPENDENT_AMBULATORY_CARE_PROVIDER_SITE_OTHER): Payer: Medicare Other | Admitting: Physician Assistant

## 2021-10-11 ENCOUNTER — Encounter: Payer: Self-pay | Admitting: Physician Assistant

## 2021-10-11 VITALS — BP 116/71 | HR 100 | Temp 97.7°F | Ht 67.0 in | Wt 192.0 lb

## 2021-10-11 DIAGNOSIS — Z780 Asymptomatic menopausal state: Secondary | ICD-10-CM

## 2021-10-11 DIAGNOSIS — E2839 Other primary ovarian failure: Secondary | ICD-10-CM

## 2021-10-11 DIAGNOSIS — E1169 Type 2 diabetes mellitus with other specified complication: Secondary | ICD-10-CM

## 2021-10-11 DIAGNOSIS — E1110 Type 2 diabetes mellitus with ketoacidosis without coma: Secondary | ICD-10-CM

## 2021-10-11 DIAGNOSIS — Z0000 Encounter for general adult medical examination without abnormal findings: Secondary | ICD-10-CM | POA: Diagnosis not present

## 2021-10-11 DIAGNOSIS — Z1231 Encounter for screening mammogram for malignant neoplasm of breast: Secondary | ICD-10-CM | POA: Diagnosis not present

## 2021-10-11 DIAGNOSIS — E559 Vitamin D deficiency, unspecified: Secondary | ICD-10-CM

## 2021-10-11 DIAGNOSIS — I152 Hypertension secondary to endocrine disorders: Secondary | ICD-10-CM

## 2021-10-11 DIAGNOSIS — E782 Mixed hyperlipidemia: Secondary | ICD-10-CM

## 2021-10-11 DIAGNOSIS — E1159 Type 2 diabetes mellitus with other circulatory complications: Secondary | ICD-10-CM

## 2021-10-11 NOTE — Progress Notes (Signed)
Subjective:   Mercedes Webb is a 66 y.o. female who presents for Medicare Annual (Subsequent) preventive examination.  Review of Systems     Refer to PCP  I connected with  Mercedes Webb on 10/11/21 by an audio only telemedicine application and verified that I am speaking with the correct person using two identifiers.   I discussed the limitations, risks, security and privacy concerns of performing an evaluation and management service by telephone and the availability of in person appointments. I also discussed with the patient that there may be a patient responsible charge related to this service. The patient expressed understanding and verbally consented to this telephonic visit.  Location of Patient: [place] of Provider: Office  List any persons and their role that are participating in the visit with the patient.         Objective:    Today's Vitals   10/11/21 0920  BP: 116/71  Pulse: 100  Temp: 97.7 F (36.5 C)  SpO2: 94%  Weight: 192 lb (87.1 kg)  Height: 5' 7"  (1.702 m)   Body mass index is 30.07 kg/m.     01/14/2021    1:00 AM 01/13/2021    5:34 PM 08/11/2019   12:27 PM 11/12/2016    8:18 AM 02/03/2016   11:55 AM 01/31/2016    6:20 AM 01/04/2015    5:34 PM  Advanced Directives  Does Patient Have a Medical Advance Directive? Unable to assess, patient is non-responsive or altered mental status Yes No No No No No  Type of Advance Directive  Living will       Would patient like information on creating a medical advance directive?   No - Patient declined No - Patient declined No - patient declined information  No - patient declined information    Current Medications (verified) Outpatient Encounter Medications as of 10/11/2021  Medication Sig   Accu-Chek Softclix Lancets lancets Check sugars four times daily.   Ascorbic Acid (VITAMIN C) 1000 MG tablet Take 1,000 mg by mouth daily.   atorvastatin (LIPITOR) 10 MG tablet TAKE 1 TABLET BY MOUTH NIGHTLY.    B-D UF III MINI PEN NEEDLES 31G X 5 MM MISC USE  THREE TIMES DAILY   blood glucose meter kit and supplies Dispense based on patient and insurance preference. Use to check fasting blood sugar once daily.   Calcium Carbonate (CALTRATE 600 PO) Take 600 mg by mouth daily.   glucose blood test strip Check sugars four times daily.   insulin isophane & regular human KwikPen (HUMULIN 70/30 KWIKPEN) (70-30) 100 UNIT/ML KwikPen Inject 10 units into the skin in the morning and 12 units into the skin at bedtime.   losartan (COZAAR) 25 MG tablet TAKE 1 TABLET BY MOUTH DAILY   Multiple Vitamin (MULTIVITAMIN WITH MINERALS) TABS tablet Take 1 tablet by mouth daily. Centrum Silver   omega-3 acid ethyl esters (LOVAZA) 1 g capsule Take 2 capsules (2 g total) by mouth 2 (two) times daily.   QC LO-DOSE ASPIRIN 81 MG EC tablet TAKE 1 TABLET (81 MG TOTAL) BY MOUTH DAILY.   triamterene-hydrochlorothiazide (MAXZIDE-25) 37.5-25 MG tablet TAKE 1 TABLET BY MOUTH DAILY.   valACYclovir (VALTREX) 500 MG tablet TAKE 1 TABLET BY MOUTH DAILY.   venlafaxine XR (EFFEXOR-XR) 150 MG 24 hr capsule Take 150 mg by mouth every morning.   zolpidem (AMBIEN CR) 12.5 MG CR tablet Take 12.5 mg by mouth at bedtime.   No facility-administered encounter medications on file as  of 10/11/2021.    Allergies (verified) Prozac [fluoxetine hcl]   History: Past Medical History:  Diagnosis Date   Anxiety    Cancer ([place]) 2016   Skin CA squamous cell on bilateral feet removed   Cervical dysplasia    h/o   Colon polyps    polyps on first scope ~ 2006, no recurrent polyps ~ 2001 and in 2016.    Diabetes mellitus without complication ([place])    Endometriosis    Family history of adverse reaction to anesthesia    Mother had difficulty waking & nausea   Headache    Hypertension    Insomnia    Pancreatitis 01/2016   Recurrent cold sores    Past Surgical History:  Procedure Laterality Date   ABDOMINAL HYSTERECTOMY     tah/bso- endometriosis    CHOLECYSTECTOMY     ERCP N/A 02/03/2016   Procedure: ENDOSCOPIC RETROGRADE CHOLANGIOPANCREATOGRAPHY (ERCP);  Surgeon: Carol Ada, MD;  Location: [facility] ENDOSCOPY;  Service: Endoscopy;  Laterality: N/A;   KNEE ARTHROSCOPY     tonsillectomy     TONSILLECTOMY     TUBAL LIGATION     Family History  Problem Relation Age of Onset   Diabetes Father    Heart disease Father    Breast cancer Sister 30   Diabetes Brother    Colon cancer Neg Hx    Ovarian cancer Neg Hx    Social History   Socioeconomic History   Marital status: Divorced    Spouse name: Not on file   Number of children: 1   Years of education: Not on file   Highest education level: Not on file  Occupational History   Not on file  Tobacco Use   Smoking status: Former    Packs/day: 0.50    Types: Cigarettes    Start date: 04/30/1970    Quit date: 04/30/1988    Years since quitting: 33.4   Smokeless tobacco: Never  Vaping Use   Vaping Use: Never used  Substance and Sexual Activity   Alcohol use: Not Currently   Drug use: No   Sexual activity: Yes    Partners: Male    Birth control/protection: Surgical  Other Topics Concern   Not on file  Social History Narrative   Not on file   Social Determinants of Health   Financial Resource Strain: Low Risk  (10/11/2021)   Overall Financial Resource Strain (CARDIA)    Difficulty of Paying Living Expenses: Not hard at all  Food Insecurity: No Food Insecurity (10/11/2021)   Hunger Vital Sign    Worried About Running Out of Food in the Last Year: Never true    [place] in the Last Year: Never true  Transportation Needs: No Transportation Needs (10/11/2021)   PRAPARE - Hydrologist (Medical): No    Lack of Transportation (Non-Medical): No  Physical Activity: Insufficiently Active (10/11/2021)   Exercise Vital Sign    Days of Exercise per Week: 3 days    Minutes of Exercise per Session: 30 min  Stress: No Stress Concern Present (10/11/2021)    [place]    Feeling of Stress : Not at all  Social Connections: Moderately Isolated (10/11/2021)   Social Connection and Isolation Panel [NHANES]    Frequency of Communication with Friends and Family: More than three times a week    Frequency of Social Gatherings with Friends and Family: Three times a week  Attends Religious Services: Never    Active Member of Clubs or Organizations: Yes    Attends Archivist Meetings: Never    Marital Status: Divorced    Tobacco Counseling Counseling given: Not Answered   Clinical Intake:              How often do you need to have someone help you when you read instructions, pamphlets, or other written materials from your doctor or pharmacy?: (P) 1 - Never  Diabetic?Yes         Activities of Daily Living    10/11/2021    9:25 AM 10/09/2021   12:23 PM  In your present state of health, do you have any difficulty performing the following activities:  Hearing? 0 0  Vision? 1 0  Difficulty concentrating or making decisions? 0 0  Walking or climbing stairs? 0 0  Dressing or bathing? 0 0  Doing errands, shopping? 0 0  Preparing Food and eating ?  N  Using the Toilet?  N  In the past six months, have you accidently leaked urine?  N  Do you have problems with loss of bowel control?  N  Managing your Medications?  N  Managing your Finances?  N  Housekeeping or managing your Housekeeping?  N    Patient Care Team: Lorrene Reid, PA-C as PCP - General [place], Alanda Slim, MD as Consulting Physician (Obstetrics and Gynecology) [place], Raiford Simmonds, CNM (Inactive) as Midwife (Obstetrics and Gynecology) Danella Sensing, MD as Consulting Physician (Dermatology) Richmond Campbell, MD as Consulting Physician (Gastroenterology) Carol Ada, MD as Consulting Physician (Gastroenterology) Keene Breath., MD (Ophthalmology) Joylene Igo, CNM (Inactive) as  Midwife (Obstetrics and Gynecology) Doree Fudge, PhD as Consulting Physician (Psychology)  Indicate any recent Medical Services you may have received from other than [facility] providers in the past year (date may be approximate).     Assessment:   This is a routine wellness examination for [place].  Hearing/Vision screen No results found.  Dietary issues and exercise activities discussed:     Goals Addressed   None   Depression Screen    10/11/2021    9:25 AM 07/10/2021    1:42 PM 04/05/2021    1:36 PM 01/30/2021    4:20 PM 01/09/2021   11:27 AM 08/01/2020    9:38 AM 05/05/2020    9:14 AM  PHQ 2/9 Scores  PHQ - 2 Score 0 0 0 0 0    PHQ- 9 Score 3 3 2  0 0    Exception Documentation      Patient refusal Patient refusal    Fall Risk    10/11/2021    9:24 AM 10/09/2021   12:23 PM 07/10/2021    1:42 PM 04/05/2021    1:36 PM 01/30/2021    4:20 PM  [place] in the past year? 0 0 0 0 0  Number falls in past yr: 0 0 0 0 0  Injury with Fall? 0 0 0 0 0  Risk for fall due to : No Fall Risks  No Fall Risks No Fall Risks No Fall Risks  Follow up Falls evaluation completed  Falls evaluation completed Falls evaluation completed Falls evaluation completed    [place]:  Any stairs in or around the home? Yes  If so, are there any without handrails? Yes  Home free of loose throw rugs in walkways, pet beds, electrical cords, etc? Yes  Adequate lighting  in your home to reduce risk of falls? Yes   ASSISTIVE DEVICES UTILIZED TO PREVENT FALLS:  Life alert? No  Use of a cane, walker or w/c? No  Grab bars in the bathroom? No  Shower chair or bench in shower? No  Elevated toilet seat or a handicapped toilet? Yes   TIMED UP AND GO:  Was the test performed? Yes .  Length of time to ambulate 10 feet: 12 sec.   Gait slow and steady without use of assistive device  Cognitive Function:        10/11/2021    9:26 AM  6CIT Screen  What Year? 0  points  What month? 0 points  What time? 0 points  Count back from 20 0 points  Months in reverse 0 points  Repeat phrase 0 points  Total Score 0 points    Immunizations Immunization History  Administered Date(s) Administered   Fluad Quad(high Dose 65+) 02/19/2020, 02/23/2021   Influenza,inj,Quad PF,6+ Mos 02/01/2016, 02/15/2017, 03/05/2018, 02/24/2019   PFIZER(Purple Top)SARS-COV-2 Vaccination 08/07/2019, 08/31/2019, 04/15/2020   Pneumococcal Conjugate-13 12/31/2019   Pneumococcal Polysaccharide-23 01/09/2021   Zoster, Live 08/28/2015    TDAP status: Due, Education has been provided regarding the importance of this vaccine. Advised may receive this vaccine at local pharmacy or Health Dept. Aware to provide a copy of the vaccination record if obtained from local pharmacy or Health Dept. Verbalized acceptance and understanding.  Flu Vaccine status: Up to date  Pneumococcal vaccine status: Up to date  Covid-19 vaccine status: Completed vaccines  Qualifies for Shingles Vaccine? Yes   Zostavax completed No   Shingrix Completed?: No.    Education has been provided regarding the importance of this vaccine. Patient has been advised to call insurance company to determine out of pocket expense if they have not yet received this vaccine. Advised may also receive vaccine at local pharmacy or Health Dept. Verbalized acceptance and understanding.  Screening Tests Health Maintenance  Topic Date Due   OPHTHALMOLOGY EXAM  Never done   Hepatitis C Screening  Never done   TETANUS/TDAP  Never done   Zoster Vaccines- Shingrix (1 of 2) Never done   DEXA SCAN  Never done   MAMMOGRAM  11/22/2019   COVID-19 Vaccine (4 - Pfizer series) 06/10/2020   INFLUENZA VACCINE  11/28/2021   FOOT EXAM  01/09/2022   HEMOGLOBIN A1C  01/10/2022   COLONOSCOPY (Pts 45-70yr Insurance coverage will need to be confirmed)  02/06/2025   Pneumonia Vaccine 68 Years old  Completed   HPV VACCINES  Aged Out     Health Maintenance  Health Maintenance Due  Topic Date Due   OPHTHALMOLOGY EXAM  Never done   Hepatitis C Screening  Never done   TETANUS/TDAP  Never done   Zoster Vaccines- Shingrix (1 of 2) Never done   DEXA SCAN  Never done   MAMMOGRAM  11/22/2019   COVID-19 Vaccine (4 - Pfizer series) 06/10/2020    Colorectal cancer screening: Type of screening: Colonoscopy. Completed 2016. Repeat every 10 years  Mammogram status: Ordered TODAY. Pt provided with contact info and advised to call to schedule appt.   Dexa: Ordered today  Lung Cancer Screening: (Low Dose CT Chest recommended if Age 543-80years, 30 pack-year currently smoking OR have quit w/in 15years.) does not qualify.   Lung Cancer Screening Referral:   Additional Screening:  Hepatitis C Screening: does qualify; Patient declined  Vision Screening: Recommended annual ophthalmology exams for early detection of glaucoma and other  disorders of the eye. Is the patient up to date with their annual eye exam?  Yes  Who is the provider or what is the name of the office in which the patient attends annual eye exams? Dr. Barbie Haggis If pt is not established with a provider, would they like to be referred to a provider to establish care? No .   Dental Screening: Recommended annual dental exams for proper oral hygiene  Community Resource Referral / Chronic Care Management: CRR required this visit?  No   CCM required this visit?  No      Plan:     I have personally reviewed and noted the following in the patient's chart:   Medical and social history Use of alcohol, tobacco or illicit drugs  Current medications and supplements including opioid prescriptions.  Functional ability and status Nutritional status Physical activity Advanced directives List of other physicians Hospitalizations, surgeries, and ER visits in previous 12 months Vitals Screenings to include cognitive, depression, and falls Referrals and  appointments  In addition, I have reviewed and discussed with patient certain preventive protocols, quality metrics, and best practice recommendations. A written personalized care plan for preventive services as well as general preventive health recommendations were provided to patient.     [place], CMA   10/11/2021   Nurse Notes: Face to face 20 minutes  Ms. Chenevert , Thank you for taking time to come for your Medicare Wellness Visit. I appreciate your ongoing commitment to your health goals. Please review the following plan we discussed and let me know if I can assist you in the future.   These are the goals we discussed:  Goals   None    TDAP VACCINE AND SHINGLES VACCINE AT PHARMACY  This is a list of the screening recommended for you and due dates:  Health Maintenance  Topic Date Due   Eye exam for diabetics  Never done   Hepatitis C Screening: USPSTF Recommendation to screen - Ages 47-79 yo.  Never done   Tetanus Vaccine  Never done   Zoster (Shingles) Vaccine (1 of 2) Never done   DEXA scan (bone density measurement)  Never done   Mammogram  11/22/2019   COVID-19 Vaccine (4 - Pfizer series) 06/10/2020   Flu Shot  11/28/2021   Complete foot exam   01/09/2022   Hemoglobin A1C  01/10/2022   Colon Cancer Screening  02/06/2025   Pneumonia Vaccine  Completed   HPV Vaccine  Aged Out

## 2021-10-11 NOTE — Patient Instructions (Signed)
Preventive Care 65 Years and Older, Female Preventive care refers to lifestyle choices and visits with your health care provider that can promote health and wellness. Preventive care visits are also called wellness exams. What can I expect for my preventive care visit? Counseling Your health care provider may ask you questions about your: Medical history, including: Past medical problems. Family medical history. Pregnancy and menstrual history. History of falls. Current health, including: Memory and ability to understand (cognition). Emotional well-being. Home life and relationship well-being. Sexual activity and sexual health. Lifestyle, including: Alcohol, nicotine or tobacco, and drug use. Access to firearms. Diet, exercise, and sleep habits. Work and work environment. Sunscreen use. Safety issues such as seatbelt and bike helmet use. Physical exam Your health care provider will check your: Height and weight. These may be used to calculate your BMI (body mass index). BMI is a measurement that tells if you are at a healthy weight. Waist circumference. This measures the distance around your waistline. This measurement also tells if you are at a healthy weight and may help predict your risk of certain diseases, such as type 2 diabetes and high blood pressure. Heart rate and blood pressure. Body temperature. Skin for abnormal spots. What immunizations do I need?  Vaccines are usually given at various ages, according to a schedule. Your health care provider will recommend vaccines for you based on your age, medical history, and lifestyle or other factors, such as travel or where you work. What tests do I need? Screening Your health care provider may recommend screening tests for certain conditions. This may include: Lipid and cholesterol levels. Hepatitis C test. Hepatitis B test. HIV (human immunodeficiency virus) test. STI (sexually transmitted infection) testing, if you are at  risk. Lung cancer screening. Colorectal cancer screening. Diabetes screening. This is done by checking your blood sugar (glucose) after you have not eaten for a while (fasting). Mammogram. Talk with your health care provider about how often you should have regular mammograms. BRCA-related cancer screening. This may be done if you have a family history of breast, ovarian, tubal, or peritoneal cancers. Bone density scan. This is done to screen for osteoporosis. Talk with your health care provider about your test results, treatment options, and if necessary, the need for more tests. Follow these instructions at home: Eating and drinking  Eat a diet that includes fresh fruits and vegetables, whole grains, lean protein, and low-fat dairy products. Limit your intake of foods with high amounts of sugar, saturated fats, and salt. Take vitamin and mineral supplements as recommended by your health care provider. Do not drink alcohol if your health care provider tells you not to drink. If you drink alcohol: Limit how much you have to 0-1 drink a day. Know how much alcohol is in your drink. In the [place] one drink equals one 12 oz bottle of beer (355 mL), one 5 oz glass of wine (148 mL), or one 1 oz glass of hard liquor (44 mL). Lifestyle Brush your teeth every morning and night with fluoride toothpaste. Floss one time each day. Exercise for at least 30 minutes 5 or more days each week. Do not use any products that contain nicotine or tobacco. These products include cigarettes, chewing tobacco, and vaping devices, such as e-cigarettes. If you need help quitting, ask your health care provider. Do not use drugs. If you are sexually active, practice safe sex. Use a condom or other form of protection in order to prevent STIs. Take aspirin only as told by   your health care provider. Make sure that you understand how much to take and what form to take. Work with your health care provider to find out whether it  is safe and beneficial for you to take aspirin daily. Ask your health care provider if you need to take a cholesterol-lowering medicine (statin). Find healthy ways to manage stress, such as: Meditation, yoga, or listening to music. Journaling. Talking to a trusted person. Spending time with friends and family. Minimize exposure to UV radiation to reduce your risk of skin cancer. Safety Always wear your seat belt while driving or riding in a vehicle. Do not drive: If you have been drinking alcohol. Do not ride with someone who has been drinking. When you are tired or distracted. While texting. If you have been using any mind-altering substances or drugs. Wear a helmet and other protective equipment during sports activities. If you have firearms in your house, make sure you follow all gun safety procedures. What's next? Visit your health care provider once a year for an annual wellness visit. Ask your health care provider how often you should have your eyes and teeth checked. Stay up to date on all vaccines. This information is not intended to replace advice given to you by your health care provider. Make sure you discuss any questions you have with your health care provider. Document Revised: 10/12/2020 Document Reviewed: 10/12/2020 Elsevier Patient Education  2023 [employer]  

## 2021-10-12 LAB — LIPID PANEL
Chol/HDL Ratio: 3.6 ratio (ref 0.0–4.4)
Cholesterol, Total: 150 mg/dL (ref 100–199)
HDL: 42 mg/dL (ref >39)
LDL Chol Calc (NIH): 66 mg/dL (ref 0–99)
Triglycerides: 264 mg/dL — ABNORMAL HIGH (ref 0–149)
VLDL Cholesterol Cal: 42 mg/dL — ABNORMAL HIGH (ref 5–40)

## 2021-10-12 LAB — COMPREHENSIVE METABOLIC PANEL
ALT: 45 IU/L — ABNORMAL HIGH (ref 0–32)
AST: 31 IU/L (ref 0–40)
Albumin/Globulin Ratio: 1.8 (ref 1.2–2.2)
Albumin: 5 g/dL — ABNORMAL HIGH (ref 3.8–4.8)
Alkaline Phosphatase: 122 IU/L — ABNORMAL HIGH (ref 44–121)
BUN/Creatinine Ratio: 20 (ref 12–28)
BUN: 20 mg/dL (ref 8–27)
Bilirubin Total: 0.6 mg/dL (ref 0.0–1.2)
CO2: 21 mmol/L (ref 20–29)
Calcium: 10.2 mg/dL (ref 8.7–10.3)
Chloride: 100 mmol/L (ref 96–106)
Creatinine, Ser: 0.99 mg/dL (ref 0.57–1.00)
Globulin, Total: 2.8 g/dL (ref 1.5–4.5)
Glucose: 256 mg/dL — ABNORMAL HIGH (ref 70–99)
Potassium: 4.5 mmol/L (ref 3.5–5.2)
Sodium: 137 mmol/L (ref 134–144)
Total Protein: 7.8 g/dL (ref 6.0–8.5)
eGFR: 62 mL/min/{1.73_m2} (ref >59)

## 2021-10-12 LAB — CBC
Hematocrit: 44 % (ref 34.0–46.6)
Hemoglobin: 14.7 g/dL (ref 11.1–15.9)
MCH: 30.4 pg (ref 26.6–33.0)
MCHC: 33.4 g/dL (ref 31.5–35.7)
MCV: 91 fL (ref 79–97)
Platelets: 279 10*3/uL (ref 150–450)
RBC: 4.84 x10E6/uL (ref 3.77–5.28)
RDW: 14 % (ref 11.7–15.4)
WBC: 8 10*3/uL (ref 3.4–10.8)

## 2021-10-12 LAB — HEMOGLOBIN A1C
Est. average glucose Bld gHb Est-mCnc: 189 mg/dL
Hgb A1c MFr Bld: 8.2 % — ABNORMAL HIGH (ref 4.8–5.6)

## 2021-10-12 LAB — VITAMIN D 25 HYDROXY (VIT D DEFICIENCY, FRACTURES): Vit D, 25-Hydroxy: 59.5 ng/mL (ref 30.0–100.0)

## 2021-10-12 LAB — TSH: TSH: 4.52 u[IU]/mL — ABNORMAL HIGH (ref 0.450–4.500)

## 2021-10-18 ENCOUNTER — Other Ambulatory Visit: Payer: Self-pay | Admitting: Physician Assistant

## 2021-10-18 DIAGNOSIS — E1159 Type 2 diabetes mellitus with other circulatory complications: Secondary | ICD-10-CM

## 2021-10-18 DIAGNOSIS — E1129 Type 2 diabetes mellitus with other diabetic kidney complication: Secondary | ICD-10-CM

## 2021-10-18 DIAGNOSIS — E782 Mixed hyperlipidemia: Secondary | ICD-10-CM

## 2021-10-26 ENCOUNTER — Other Ambulatory Visit: Payer: Self-pay | Admitting: Physician Assistant

## 2021-10-26 DIAGNOSIS — B009 Herpesviral infection, unspecified: Secondary | ICD-10-CM

## 2021-11-01 ENCOUNTER — Ambulatory Visit
Admission: RE | Admit: 2021-11-01 | Discharge: 2021-11-01 | Disposition: A | Discharge status: Home / Self Care | Payer: Medicare Other | Source: Ambulatory Visit | Attending: Physician Assistant | Admitting: Physician Assistant

## 2021-11-01 DIAGNOSIS — Z0000 Encounter for general adult medical examination without abnormal findings: Secondary | ICD-10-CM

## 2021-11-01 DIAGNOSIS — Z1231 Encounter for screening mammogram for malignant neoplasm of breast: Secondary | ICD-10-CM

## 2021-11-07 ENCOUNTER — Ambulatory Visit (INDEPENDENT_AMBULATORY_CARE_PROVIDER_SITE_OTHER): Payer: Medicare Other

## 2021-11-07 ENCOUNTER — Ambulatory Visit: Payer: Medicare Other | Admitting: Orthopaedic Surgery

## 2021-11-07 DIAGNOSIS — M7062 Trochanteric bursitis, left hip: Secondary | ICD-10-CM | POA: Diagnosis not present

## 2021-11-07 DIAGNOSIS — M25552 Pain in left hip: Secondary | ICD-10-CM

## 2021-11-07 MED ORDER — LIDOCAINE HCL 1 % IJ SOLN
2.0000 mL | INTRAMUSCULAR | Status: AC | PRN
  Administered 2021-11-07: 2 mL

## 2021-11-07 MED ORDER — METHYLPREDNISOLONE ACETATE 40 MG/ML IJ SUSP
80.0000 mg | INTRAMUSCULAR | Status: AC | PRN
  Administered 2021-11-07: 80 mg via INTRA_ARTICULAR

## 2021-11-07 MED ORDER — BUPIVACAINE HCL 0.25 % IJ SOLN
2.0000 mL | INTRAMUSCULAR | Status: AC | PRN
  Administered 2021-11-07: 2 mL via INTRA_ARTICULAR

## 2021-11-07 NOTE — Progress Notes (Signed)
Office Visit Note   Patient: Mercedes Webb           Date of Birth: 06-21-1955           MRN: [id number] Visit Date: 11/07/2021              Requested by: Lorrene Reid, PA-C [place],  [place] [id number] PCP: Lorrene Reid, PA-C  Chief Complaint  Patient presents with   Left Hip - Pain      HPI: Patient is a pleasant 66 year old woman with a 1+ year history of pain on her lateral left hip.  She denies any injuries.  She has 1 focal area she says "over the bone "where it hurts.  She denies back or groin pain.  She has tried physical therapy and massage which have helped a little bit but not been long-lasting.  She is also tried some topical patches as well.  Assessment & Plan: Visit Diagnoses:  1. Pain in left hip   2. Trochanteric bursitis, left hip     Plan: Although she does have some arthritis on x-ray and her hip findings were more consistent for her trochanteric bursitis of the left hip.  We went forward and injected her.  She may follow-up as needed or if she does not get good improvement.  Follow-Up Instructions: As needed  Ortho Exam  Patient is alert, oriented, no adenopathy, well-dressed, normal affect, normal respiratory effort. Examination of her left hip she has a negative straight leg raise no pain with internal/external rotation of her hip she has 5 out of 5 strength in her lower extremity sensation is intact.  She is focally tender over the trochanteric bursa  Imaging: XR HIP UNILAT W OR W/O PELVIS 1V LEFT  Result Date: 11/07/2021 Radiographs of her left hip were reviewed today in multiple projections.  She has well-maintained alignment and femoral head is well placed in the acetabulum she does have some sclerotic changes and small osteophyte fight formation at the superior and inferior pole of the acetabulum.  No evidence of any fracture  No images are attached to the encounter.  Labs: Lab Results  Component Value Date    HGBA1C 8.2 (H) 10/11/2021   HGBA1C 7.3 (A) 07/10/2021   HGBA1C 13.6 (H) 01/13/2021   REPTSTATUS 01/21/2021 FINAL 01/16/2021   CULT  01/16/2021    NO GROWTH 5 DAYS Performed at [facility], [place] [address] [place],  [address]      Lab Results  Component Value Date   ALBUMIN 5.0 (H) 10/11/2021   ALBUMIN 5.2 (H) 04/05/2021   ALBUMIN 4.4 01/31/2021    Lab Results  Component Value Date   MG 1.6 (L) 01/14/2021   MG 1.8 05/05/2020   MG 1.9 12/31/2019   Lab Results  Component Value Date   VD25OH 59.5 10/11/2021   VD25OH 38.4 01/31/2021   VD25OH 101.0 (H) 05/05/2020    No results found for: "PREALBUMIN"    Latest Ref Rng & Units 10/11/2021    9:56 AM 01/31/2021    8:30 AM 01/17/2021    6:30 AM  CBC EXTENDED  WBC 3.4 - 10.8 x10E3/uL 8.0  5.2    RBC 3.77 - 5.28 x10E6/uL 4.84  4.09    Hemoglobin 11.1 - 15.9 g/dL 14.7  12.7    HCT 34.0 - 46.6 % 44.0  38.4    Platelets 150 - 450 x10E3/uL 279  298  158   NEUT# 1.4 -  7.0 x10E3/uL  3.6    Lymph# 0.7 - 3.1 x10E3/uL  1.2       There is no height or weight on file to calculate BMI.  Orders:  Orders Placed This Encounter  Procedures   Large Joint Inj: L greater trochanter   XR HIP UNILAT W OR W/O PELVIS 1V LEFT   No orders of the defined types were placed in this encounter.    Procedures: Large Joint Inj: L greater trochanter on 11/07/2021 4:56 PM Indications: pain and diagnostic evaluation Details: 25 G 3.5 in needle  Arthrogram: No  Medications: 2 mL lidocaine 1 %; 80 mg methylPREDNISolone acetate 40 MG/ML; 2 mL bupivacaine 0.25 % Outcome: tolerated well, no immediate complications Procedure, treatment alternatives, risks and benefits explained, specific risks discussed. Consent was given by the patient. Immediately prior to procedure a time out was called to verify the correct patient, procedure, equipment, support staff and site/side marked as required. Patient was prepped and draped in the usual sterile  fashion.      Clinical Data: No additional findings.  ROS:  All other systems negative, except as noted in the HPI. Review of Systems  Objective: Vital Signs: There were no vitals taken for this visit.  Specialty Comments:  No specialty comments available.  PMFS History: Patient Active Problem List   Diagnosis Date Noted   Trochanteric bursitis, left hip 11/07/2021   DKA (diabetic ketoacidosis) ([place]) 01/13/2021   Dysuria 08/01/2020   Urinary tract infection without hematuria 08/01/2020   Hematuria 08/01/2020   Microalbuminuria due to type 2 diabetes mellitus ([place]) 06/17/2019   Mixed diabetic hyperlipidemia associated with type 2 diabetes mellitus ([place]) 10/23/2018   Diabetes mellitus ([place]) 10/23/2018   Secondary hyperhidrosis- not focal in nature but primarily from waist up is the worst 10/23/2018   Acute stress reaction 08/20/2018   Excess, secretion, sweat 08/20/2018   Low serum progesterone 05/30/2018   Mood disorder ([place])- mixed anxiety and depression 01/20/2018   Panic attack 01/20/2018   Excessive sweating- likely due to hormonal 11/11/2017   Inactivity 11/11/2017   Elevated serum creatinine 09/04/2017   Elevated ALT measurement 09/04/2017   Obesity, Class I, BMI 30-34.9 06/10/2017   Postmenopausal- 30 yrs ago TAH "yrs ago" 11/12/2016   Hormone imbalance- txed by gyn 11/12/2016   Mixed hyperlipidemia 11/12/2016   Primary insomnia 11/12/2016   Adjustment disorder with mixed anxiety and depressed mood 11/12/2016   Elevated cholesterol with elevated triglycerides 09/28/2016   Vitamin D deficiency 09/28/2016   Hypokalemia 02/01/2016   Pancreatitis 01/31/2016   Hypertension associated with diabetes ([place]) 01/31/2016   Generalized anxiety disorder 01/31/2016   Overweight 12/23/2014   Surgical menopause 12/23/2014   Status post abdominal hysterectomy 12/23/2014   History of cervical dysplasia 12/23/2014   SUI (stress urinary incontinence, female) 12/23/2014    Past Medical History:  Diagnosis Date   Anxiety    Cancer ([place]) 2016   Skin CA squamous cell on bilateral feet removed   Cervical dysplasia    h/o   Colon polyps    polyps on first scope ~ 2006, no recurrent polyps ~ 2001 and in 2016.    Diabetes mellitus without complication ([place])    Endometriosis    Family history of adverse reaction to anesthesia    Mother had difficulty waking & nausea   Headache    Hypertension    Insomnia    Pancreatitis 01/2016   Recurrent cold sores     Family History  Problem Relation Age  of Onset   Diabetes Father    Heart disease Father    Breast cancer Sister 29   Diabetes Brother    Colon cancer Neg Hx    Ovarian cancer Neg Hx     Past Surgical History:  Procedure Laterality Date   ABDOMINAL HYSTERECTOMY     tah/bso- endometriosis   CHOLECYSTECTOMY     ERCP N/A 02/03/2016   Procedure: ENDOSCOPIC RETROGRADE CHOLANGIOPANCREATOGRAPHY (ERCP);  Surgeon: Carol Ada, MD;  Location: [facility] ENDOSCOPY;  Service: Endoscopy;  Laterality: N/A;   KNEE ARTHROSCOPY     tonsillectomy     TONSILLECTOMY     TUBAL LIGATION     Social History   Occupational History   Not on file  Tobacco Use   Smoking status: Former    Packs/day: 0.50    Types: Cigarettes    Start date: 04/30/1970    Quit date: 04/30/1988    Years since quitting: 33.5   Smokeless tobacco: Never  Vaping Use   Vaping Use: Never used  Substance and Sexual Activity   Alcohol use: Not Currently   Drug use: No   Sexual activity: Yes    Partners: Male    Birth control/protection: Surgical

## 2021-12-20 ENCOUNTER — Encounter: Payer: Self-pay | Admitting: Orthopaedic Surgery

## 2021-12-20 ENCOUNTER — Ambulatory Visit: Payer: Medicare Other | Admitting: Orthopaedic Surgery

## 2021-12-20 DIAGNOSIS — M7062 Trochanteric bursitis, left hip: Secondary | ICD-10-CM

## 2021-12-20 DIAGNOSIS — M25552 Pain in left hip: Secondary | ICD-10-CM | POA: Diagnosis not present

## 2021-12-20 NOTE — Progress Notes (Signed)
Office Visit Note   Patient: Mercedes Webb           Date of Birth: 1955/11/24           MRN: [id number] Visit Date: 12/20/2021              Requested by: Lorrene Reid, PA-C [place],  [place] [address] PCP: Lorrene Reid, PA-C   Assessment & Plan: Visit Diagnoses:  1. Trochanteric bursitis, left hip   2. Pain in left hip     Plan: Mercedes Webb relates that Mercedes Webb had little if any relief from the cortisone injection about the left greater trochanter.  The pain is still localized to that area and Mercedes Webb is not having any groin or anterior thigh pain and relates not having any discomfort in the lumbar spine.  Some tenderness over the greater trochanter.  Strength is good and neurologically intact.  I am going to order an MRI scan of her left hip and then have her return shortly thereafter.  There is always a possibility this could be referred from her hip.  There was minimal degenerative change by plain film  Follow-Up Instructions: Return After MRI scan left hip.   Orders:  Orders Placed This Encounter  Procedures   MR Hip Left w/o contrast   No orders of the defined types were placed in this encounter.     Procedures: No procedures performed   Clinical Data: No additional findings.   Subjective: Chief Complaint  Patient presents with   Left Hip - Follow-up   Patient presents today as a follow up of her left hip. Mercedes Webb states that Mercedes Webb received a injection on 11/07/2021 and that gave no relief of pain. Mercedes Webb states that pain is now starting to become constant. OTC advil is being used however is giving no relief. Patient states that Mercedes Webb has been avoiding excessive stairs, bending, and squatting.   Review of Systems   Objective: Vital Signs: There were no vitals taken for this visit.  Physical Exam Constitutional:      Appearance: Mercedes Webb is well-developed.  Eyes:     Pupils: Pupils are equal, round, and reactive to light.  Pulmonary:      Effort: Pulmonary effort is normal.  Skin:    General: Skin is warm and dry.  Neurological:     Mental Status: Mercedes Webb is alert and oriented to person, place, and time.  Psychiatric:        Behavior: Behavior normal.     Ortho Exam left hip with painless internal/external rotation.  There was some local tenderness over the greater trochanter.  Skin intact.  Straight leg raise negative.  No percussible back or sacral pain.  Motor exam intact  Specialty Comments:  No specialty comments available.  Imaging: No results found.   PMFS History: Patient Active Problem List   Diagnosis Date Noted   Trochanteric bursitis, left hip 11/07/2021   DKA (diabetic ketoacidosis) ([place]) 01/13/2021   Dysuria 08/01/2020   Urinary tract infection without hematuria 08/01/2020   Hematuria 08/01/2020   Microalbuminuria due to type 2 diabetes mellitus ([place]) 06/17/2019   Mixed diabetic hyperlipidemia associated with type 2 diabetes mellitus ([place]) 10/23/2018   Diabetes mellitus ([place]) 10/23/2018   Secondary hyperhidrosis- not focal in nature but primarily from waist up is the worst 10/23/2018   Acute stress reaction 08/20/2018   Excess, secretion, sweat 08/20/2018   Low serum progesterone 05/30/2018   Mood disorder ([place])- mixed anxiety and  depression 01/20/2018   Panic attack 01/20/2018   Excessive sweating- likely due to hormonal 11/11/2017   Inactivity 11/11/2017   Elevated serum creatinine 09/04/2017   Elevated ALT measurement 09/04/2017   Obesity, Class I, BMI 30-34.9 06/10/2017   Postmenopausal- 30 yrs ago TAH "yrs ago" 11/12/2016   Hormone imbalance- txed by gyn 11/12/2016   Mixed hyperlipidemia 11/12/2016   Primary insomnia 11/12/2016   Adjustment disorder with mixed anxiety and depressed mood 11/12/2016   Elevated cholesterol with elevated triglycerides 09/28/2016   Vitamin D deficiency 09/28/2016   Hypokalemia 02/01/2016   Pancreatitis 01/31/2016   Hypertension associated with diabetes ([place])  01/31/2016   Generalized anxiety disorder 01/31/2016   Overweight 12/23/2014   Surgical menopause 12/23/2014   Status post abdominal hysterectomy 12/23/2014   History of cervical dysplasia 12/23/2014   SUI (stress urinary incontinence, female) 12/23/2014   Past Medical History:  Diagnosis Date   Anxiety    Cancer ([place]) 2016   Skin CA squamous cell on bilateral feet removed   Cervical dysplasia    h/o   Colon polyps    polyps on first scope ~ 2006, no recurrent polyps ~ 2001 and in 2016.    Diabetes mellitus without complication ([place])    Endometriosis    Family history of adverse reaction to anesthesia    Mother had difficulty waking & nausea   Headache    Hypertension    Insomnia    Pancreatitis 01/2016   Recurrent cold sores     Family History  Problem Relation Age of Onset   Diabetes Father    Heart disease Father    Breast cancer Sister 73   Diabetes Brother    Colon cancer Neg Hx    Ovarian cancer Neg Hx     Past Surgical History:  Procedure Laterality Date   ABDOMINAL HYSTERECTOMY     tah/bso- endometriosis   CHOLECYSTECTOMY     ERCP N/A 02/03/2016   Procedure: ENDOSCOPIC RETROGRADE CHOLANGIOPANCREATOGRAPHY (ERCP);  Surgeon: Carol Ada, MD;  Location: [facility] ENDOSCOPY;  Service: Endoscopy;  Laterality: N/A;   KNEE ARTHROSCOPY     tonsillectomy     TONSILLECTOMY     TUBAL LIGATION     Social History   Occupational History   Not on file  Tobacco Use   Smoking status: Former    Packs/day: 0.50    Types: Cigarettes    Start date: 04/30/1970    Quit date: 04/30/1988    Years since quitting: 33.6   Smokeless tobacco: Never  Vaping Use   Vaping Use: Never used  Substance and Sexual Activity   Alcohol use: Not Currently   Drug use: No   Sexual activity: Yes    Partners: Male    Birth control/protection: Surgical

## 2021-12-21 ENCOUNTER — Telehealth: Payer: Self-pay | Admitting: Physician Assistant

## 2021-12-21 NOTE — Telephone Encounter (Signed)
Patient seen ortho back in July and he gave her a steroid shot and since then her glucose has been elevated in the 400's checking it twice sometimes 3x a day. She is concerned if she needs to increase the insulin or what to do?

## 2021-12-22 NOTE — Telephone Encounter (Signed)
Patient aware.

## 2021-12-22 NOTE — Telephone Encounter (Signed)
Can you please ask patient times of glucose measurements? Please ask her to list the last week, AMs and PMs

## 2021-12-22 NOTE — Telephone Encounter (Signed)
08/18 413 AM 341 PM 08/19 393 AM 402 PM 08/20 402 AM 401 PM 08/21 429 AM 475 PM 08/22 454 AM 398 PM 08/23 438 AM 478 PM 08/24 472 AM 408 PM

## 2022-01-02 ENCOUNTER — Ambulatory Visit
Admission: RE | Admit: 2022-01-02 | Discharge: 2022-01-02 | Disposition: A | Discharge status: Home / Self Care | Payer: Medicare Other | Source: Ambulatory Visit | Attending: Orthopaedic Surgery | Admitting: Orthopaedic Surgery

## 2022-01-02 DIAGNOSIS — M25552 Pain in left hip: Secondary | ICD-10-CM

## 2022-01-02 DIAGNOSIS — M7062 Trochanteric bursitis, left hip: Secondary | ICD-10-CM

## 2022-01-04 ENCOUNTER — Encounter: Payer: Self-pay | Admitting: Orthopaedic Surgery

## 2022-01-04 ENCOUNTER — Ambulatory Visit: Payer: Medicare Other | Admitting: Orthopaedic Surgery

## 2022-01-04 DIAGNOSIS — M25552 Pain in left hip: Secondary | ICD-10-CM

## 2022-01-04 NOTE — Progress Notes (Signed)
Office Visit Note   Patient: Mercedes Webb           Date of Birth: July 22, 1955           MRN: [id number] Visit Date: 01/04/2022              Requested by: Lorrene Reid, PA-C [place],  [place] [address] PCP: Lorrene Reid, PA-C   Assessment & Plan: Visit Diagnoses:  1. Pain in left hip     Plan: [name] is a 66 year old woman with a history of left hip pain.  She has had a trochanteric bursa injection which really did not help her pain very much.  Previous examination tend to favor more of a hip cause rather than a back pathology.  An MRI scan was ordered and she is here to review it today.  MRI demonstrated mild to moderate bilateral hip joint degenerative changes but no stress fracture or AVN.  She does have an anterior superior labral tear involving the left hip.  She has not had any mechanical symptoms of locking or catching in her hip.  Arthritis was more advanced than what the x-rays would have led [place] to believe.  We will go forward and order a ultrasound-guided intra-articular left hip injection with Dr. Rolena Infante.  She understands that hopefully this will be therapeutic but also diagnostic.  If she has no relief from the hip injection would be appropriate to revisit to see if this was of a lower back origin.  Follow-Up Instructions: With Dr. Rolena Infante for left hip injection  Orders:  Orders Placed This Encounter  Procedures   Ambulatory referral to Orthopedic Surgery   No orders of the defined types were placed in this encounter.     Procedures: No procedures performed   Clinical Data: No additional findings.   Subjective: Chief Complaint  Patient presents with   Left Hip - Follow-up   Patient presents today for MRI results of her left hip. Patient states that she has been taking over the counter advil for pain which is giving slight relief. She denies having any injuries or falls that she would like to report. MRI was preformed on  01/02/2022 and she would like to discuss and review treatment options.   Review of Systems  All other systems reviewed and are negative.    Objective: Vital Signs: There were no vitals taken for this visit.  Physical Exam Constitutional:      Appearance: Normal appearance.  Pulmonary:     Effort: Pulmonary effort is normal.  Neurological:     General: No focal deficit present.     Mental Status: She is alert.  Psychiatric:        Mood and Affect: Mood normal.        Behavior: Behavior normal.     Ortho Exam Examination of her left hip she has no tenderness over the trochanteric bursa.  Her distal strength and sensation is intact.  She does have some pain with internal/external rotation of the hip.  Hip motion is fairly well-preserved Specialty Comments:  No specialty comments available.  Imaging: No results found.   PMFS History: Patient Active Problem List   Diagnosis Date Noted   Pain in left hip 01/04/2022   Trochanteric bursitis, left hip 11/07/2021   DKA (diabetic ketoacidosis) ([place]) 01/13/2021   Dysuria 08/01/2020   Urinary tract infection without hematuria 08/01/2020   Hematuria 08/01/2020   Microalbuminuria due to type 2 diabetes mellitus (  [place]) 06/17/2019   Mixed diabetic hyperlipidemia associated with type 2 diabetes mellitus ([place]) 10/23/2018   Diabetes mellitus ([place]) 10/23/2018   Secondary hyperhidrosis- not focal in nature but primarily from waist up is the worst 10/23/2018   Acute stress reaction 08/20/2018   Excess, secretion, sweat 08/20/2018   Low serum progesterone 05/30/2018   Mood disorder ([place])- mixed anxiety and depression 01/20/2018   Panic attack 01/20/2018   Excessive sweating- likely due to hormonal 11/11/2017   Inactivity 11/11/2017   Elevated serum creatinine 09/04/2017   Elevated ALT measurement 09/04/2017   Obesity, Class I, BMI 30-34.9 06/10/2017   Postmenopausal- 30 yrs ago TAH "yrs ago" 11/12/2016   Hormone imbalance- txed by gyn  11/12/2016   Mixed hyperlipidemia 11/12/2016   Primary insomnia 11/12/2016   Adjustment disorder with mixed anxiety and depressed mood 11/12/2016   Elevated cholesterol with elevated triglycerides 09/28/2016   Vitamin D deficiency 09/28/2016   Hypokalemia 02/01/2016   Pancreatitis 01/31/2016   Hypertension associated with diabetes ([place]) 01/31/2016   Generalized anxiety disorder 01/31/2016   Overweight 12/23/2014   Surgical menopause 12/23/2014   Status post abdominal hysterectomy 12/23/2014   History of cervical dysplasia 12/23/2014   SUI (stress urinary incontinence, female) 12/23/2014   Past Medical History:  Diagnosis Date   Anxiety    Cancer ([place]) 2016   Skin CA squamous cell on bilateral feet removed   Cervical dysplasia    h/o   Colon polyps    polyps on first scope ~ 2006, no recurrent polyps ~ 2001 and in 2016.    Diabetes mellitus without complication ([place])    Endometriosis    Family history of adverse reaction to anesthesia    Mother had difficulty waking & nausea   Headache    Hypertension    Insomnia    Pancreatitis 01/2016   Recurrent cold sores     Family History  Problem Relation Age of Onset   Diabetes Father    Heart disease Father    Breast cancer Sister 66   Diabetes Brother    Colon cancer Neg Hx    Ovarian cancer Neg Hx     Past Surgical History:  Procedure Laterality Date   ABDOMINAL HYSTERECTOMY     tah/bso- endometriosis   CHOLECYSTECTOMY     ERCP N/A 02/03/2016   Procedure: ENDOSCOPIC RETROGRADE CHOLANGIOPANCREATOGRAPHY (ERCP);  Surgeon: Carol Ada, MD;  Location: [facility] ENDOSCOPY;  Service: Endoscopy;  Laterality: N/A;   KNEE ARTHROSCOPY     tonsillectomy     TONSILLECTOMY     TUBAL LIGATION     Social History   Occupational History   Not on file  Tobacco Use   Smoking status: Former    Packs/day: 0.50    Types: Cigarettes    Start date: 04/30/1970    Quit date: 04/30/1988    Years since quitting: 33.7   Smokeless tobacco: Never   Vaping Use   Vaping Use: Never used  Substance and Sexual Activity   Alcohol use: Not Currently   Drug use: No   Sexual activity: Yes    Partners: Male    Birth control/protection: Surgical

## 2022-01-05 ENCOUNTER — Ambulatory Visit: Payer: Medicare Other | Admitting: Sports Medicine

## 2022-01-05 ENCOUNTER — Ambulatory Visit: Payer: Self-pay

## 2022-01-05 ENCOUNTER — Encounter: Payer: Self-pay | Admitting: Sports Medicine

## 2022-01-05 VITALS — BP 153/77 | HR 95

## 2022-01-05 DIAGNOSIS — E1169 Type 2 diabetes mellitus with other specified complication: Secondary | ICD-10-CM

## 2022-01-05 DIAGNOSIS — M25552 Pain in left hip: Secondary | ICD-10-CM

## 2022-01-05 DIAGNOSIS — Z794 Long term (current) use of insulin: Secondary | ICD-10-CM

## 2022-01-05 DIAGNOSIS — E1165 Type 2 diabetes mellitus with hyperglycemia: Secondary | ICD-10-CM

## 2022-01-05 NOTE — Progress Notes (Signed)
   Procedure Note  Patient: Mercedes Webb             Date of Birth: Aug 04, 1955           MRN: [id number]             Visit Date: 01/05/2022  Procedures:  Visit Diagnoses:  1. Pain in left hip   2. Type 2 diabetes mellitus with other specified complication, with long-term current use of insulin ([name])   3. Hyperglycemia due to diabetes mellitus ([name])    Procedure: US-guided intra-articular hip injection, left After discussion on risks/benefits/indications and informed verbal consent was obtained, a timeout was performed. Patient was lying supine on exam table. The hip was cleaned with betadine and alcohol swabs. Then utilizing ultrasound guidance, the patient's femoral head and neck junction was identified and subsequently injected with 4:2 lidocaine:ketorolac '30mg'$ /mL via an in-plane approach with ultrasound visualization of the injectate administered into the hip joint. Patient tolerated procedure well without immediate complications.  - Evaluated patient about 10 mins post-injection and she had improvement in pain and ROM  *I had a discussion with [name] today regarding the risk and benefits of the injection.  We did check her blood glucose today which was 406.  She is currently working with her PCP on adjusting her insulin regimen to bring her sugars down.  I discussed with her today given her elevated sugar and her history of DKA, that the risk outweighed the benefits of proceeding with corticosteroid injection.  Through shared decision making, elected to proceed with hip injection using Toradol instead.  She will work on decreasing her sugars and if this did give her good relief we can consider an additional injection with possible corticosteroid if her sugars are better controlled sometime in the next 4-6 weeks. May follow-up with Dr. Durward Fortes as indicated.  Elba Barman, DO Primary Care Sports Medicine Physician  [place]  This note was dictated using  Dragon naturally speaking software and may contain errors in syntax, spelling, or content which have not been identified prior to signing this note.

## 2022-01-08 ENCOUNTER — Other Ambulatory Visit (INDEPENDENT_AMBULATORY_CARE_PROVIDER_SITE_OTHER): Payer: Medicare Other

## 2022-01-08 ENCOUNTER — Other Ambulatory Visit: Payer: Self-pay | Admitting: Nurse Practitioner

## 2022-01-08 DIAGNOSIS — R350 Frequency of micturition: Secondary | ICD-10-CM | POA: Diagnosis not present

## 2022-01-08 DIAGNOSIS — N390 Urinary tract infection, site not specified: Secondary | ICD-10-CM

## 2022-01-08 DIAGNOSIS — R1030 Lower abdominal pain, unspecified: Secondary | ICD-10-CM | POA: Diagnosis not present

## 2022-01-08 LAB — POCT URINALYSIS DIP (CLINITEK)
Bilirubin, UA: NEGATIVE
Blood, UA: NEGATIVE
Glucose, UA: 500 mg/dL — AB
Ketones, POC UA: NEGATIVE mg/dL
Nitrite, UA: NEGATIVE
POC PROTEIN,UA: NEGATIVE
Spec Grav, UA: 1.015 (ref 1.010–1.025)
Urobilinogen, UA: 1 E.U./dL
pH, UA: 7 (ref 5.0–8.0)

## 2022-01-08 MED ORDER — NITROFURANTOIN MONOHYD MACRO 100 MG PO CAPS: 100.0000 mg | ORAL_CAPSULE | Freq: Two times a day (BID) | ORAL | 0 refills | Status: DC

## 2022-01-08 NOTE — Progress Notes (Signed)
Treated with macrobid 100 mg bid for 5 days - adjust antibiotics as indicated

## 2022-01-10 LAB — URINE CULTURE

## 2022-02-12 ENCOUNTER — Encounter: Payer: Self-pay | Admitting: Physician Assistant

## 2022-02-12 ENCOUNTER — Ambulatory Visit (INDEPENDENT_AMBULATORY_CARE_PROVIDER_SITE_OTHER): Payer: Medicare Other | Admitting: Physician Assistant

## 2022-02-12 VITALS — BP 133/85 | HR 90 | Temp 97.8°F | Ht 67.0 in | Wt 185.0 lb

## 2022-02-12 DIAGNOSIS — E782 Mixed hyperlipidemia: Secondary | ICD-10-CM

## 2022-02-12 DIAGNOSIS — E1169 Type 2 diabetes mellitus with other specified complication: Secondary | ICD-10-CM | POA: Diagnosis not present

## 2022-02-12 DIAGNOSIS — E1159 Type 2 diabetes mellitus with other circulatory complications: Secondary | ICD-10-CM | POA: Diagnosis not present

## 2022-02-12 DIAGNOSIS — I152 Hypertension secondary to endocrine disorders: Secondary | ICD-10-CM

## 2022-02-12 LAB — POCT GLYCOSYLATED HEMOGLOBIN (HGB A1C): Hemoglobin A1C: 12.6 % — AB (ref 4.0–5.6)

## 2022-02-12 MED ORDER — GLIPIZIDE 5 MG PO TABS: 5.0000 mg | ORAL_TABLET | Freq: Two times a day (BID) | ORAL | 0 refills | Status: DC

## 2022-02-12 MED ORDER — HUMULIN 70/30 KWIKPEN (70-30) 100 UNIT/ML ~~LOC~~ SUPN: PEN_INJECTOR | SUBCUTANEOUS | 4 refills | Status: DC

## 2022-02-12 NOTE — Assessment & Plan Note (Signed)
-  Stable. Continue current medication regimen. See med list. Will collect CMP to monitor renal function and electrolytes.

## 2022-02-12 NOTE — Assessment & Plan Note (Addendum)
-  A1c 12.6, uncontrolled likely secondary to corticosteroid therapy. Discussed with patient avoiding corticosteroid injections until sugars are better controlled. Will increase Humulin 70/30 to 20 units at bedtime and continue with 13 units in the morning. Patient with hx of DKA and pancreatitis so will avoid SGL2i and GLP-1 therapy. Will start glipizide 5 mg BID with meals and advised to monitor for hypoglycemia. Discussed glipizide is a temporary medication to help lower sugars. Will reassess A1c and medication therapy in 4 weeks.

## 2022-02-12 NOTE — Patient Instructions (Signed)

## 2022-02-12 NOTE — Assessment & Plan Note (Signed)
-  Last lipid panel: HDL 42, LDL 66, triglycerides 264 likely secondary to elevated sugars. Will continue with atorvastatin 10 mg daily. Recommend to follow a heart healthy diet low in fat. Recommend repeating fasting lipid panel and hepatic function in 4 months.

## 2022-02-12 NOTE — Progress Notes (Signed)
Established patient visit   Patient: Mercedes Webb   DOB: Nov 16, 1955   66 y.o. Female  MRN: [id number] Visit Date: 02/12/2022  Chief Complaint  Patient presents with   Follow-up   Subjective    HPI  Patient presents for chronic follow-up visit.  Diabetes: Does report increased urination or thirst. Pt reports medication compliance with 13 units in the am and 15 units at bedtime. No hypoglycemic events. Checking glucose at home. FBS have averaged in the 300s. Patient reports has been receiving some steroid injections for hip. States has been eating less calories and monitoring more closely her diet.  HTN: No chest pain, palpitations, dizziness or leg swelling. Taking medication as directed without side effects.   HLD: Pt taking medication as directed without issues. Improved diet.   Medications: Outpatient Medications Prior to Visit  Medication Sig   Accu-Chek Softclix Lancets lancets Check sugars four times daily.   Ascorbic Acid (VITAMIN C) 1000 MG tablet Take 1,000 mg by mouth daily.   atorvastatin (LIPITOR) 10 MG tablet TAKE 1 TABLET BY MOUTH NIGHTLY.   B-D UF III MINI PEN NEEDLES 31G X 5 MM MISC USE  THREE TIMES DAILY   blood glucose meter kit and supplies Dispense based on patient and insurance preference. Use to check fasting blood sugar once daily.   Calcium Carbonate (CALTRATE 600 PO) Take 600 mg by mouth daily.   glucose blood test strip Check sugars four times daily.   losartan (COZAAR) 25 MG tablet TAKE 1 TABLET BY MOUTH DAILY   Multiple Vitamin (MULTIVITAMIN WITH MINERALS) TABS tablet Take 1 tablet by mouth daily. Centrum Silver   nitrofurantoin, macrocrystal-monohydrate, (MACROBID) 100 MG capsule Take 1 capsule (100 mg total) by mouth 2 (two) times daily.   omega-3 acid ethyl esters (LOVAZA) 1 g capsule Take 2 capsules (2 g total) by mouth 2 (two) times daily.   QC LO-DOSE ASPIRIN 81 MG EC tablet TAKE 1 TABLET (81 MG TOTAL) BY MOUTH DAILY.    triamterene-hydrochlorothiazide (MAXZIDE-25) 37.5-25 MG tablet TAKE 1 TABLET BY MOUTH DAILY.   valACYclovir (VALTREX) 500 MG tablet TAKE 1 TABLET BY MOUTH DAILY.   venlafaxine XR (EFFEXOR-XR) 150 MG 24 hr capsule Take 150 mg by mouth every morning.   zolpidem (AMBIEN CR) 12.5 MG CR tablet Take 12.5 mg by mouth at bedtime.   [DISCONTINUED] insulin isophane & regular human KwikPen (HUMULIN 70/30 KWIKPEN) (70-30) 100 UNIT/ML KwikPen Inject 10 units into the skin in the morning and 12 units into the skin at bedtime.   No facility-administered medications prior to visit.    Review of Systems Review of Systems:  A fourteen system review of systems was performed and found to be positive as per HPI.  Last CBC Lab Results  Component Value Date   WBC 8.0 10/11/2021   HGB 14.7 10/11/2021   HCT 44.0 10/11/2021   MCV 91 10/11/2021   MCH 30.4 10/11/2021   RDW 14.0 10/11/2021   PLT 279 67/89/3810   Last metabolic panel Lab Results  Component Value Date   GLUCOSE 256 (H) 10/11/2021   NA 137 10/11/2021   K 4.5 10/11/2021   CL 100 10/11/2021   CO2 21 10/11/2021   BUN 20 10/11/2021   CREATININE 0.99 10/11/2021   EGFR 62 10/11/2021   CALCIUM 10.2 10/11/2021   PHOS 1.3 (L) 01/14/2021   PROT 7.8 10/11/2021   ALBUMIN 5.0 (H) 10/11/2021   LABGLOB 2.8 10/11/2021   AGRATIO 1.8 10/11/2021   BILITOT 0.6 10/11/2021  ALKPHOS 122 (H) 10/11/2021   AST 31 10/11/2021   ALT 45 (H) 10/11/2021   ANIONGAP 15 01/18/2021   Last lipids Lab Results  Component Value Date   CHOL 150 10/11/2021   HDL 42 10/11/2021   LDLCALC 66 10/11/2021   LDLDIRECT 79 01/09/2021   TRIG 264 (H) 10/11/2021   CHOLHDL 3.6 10/11/2021   Last hemoglobin A1c Lab Results  Component Value Date   HGBA1C 12.6 (A) 02/12/2022   Last thyroid functions Lab Results  Component Value Date   TSH 4.520 (H) 10/11/2021   T3TOTAL 126 08/29/2017   T4TOTAL 7.7 05/29/2018   Last vitamin D Lab Results  Component Value Date   VD25OH  59.5 10/11/2021       Objective    BP 133/85   Pulse 90   Temp 97.8 F (36.6 C) (Temporal)   Ht 5' 7"  (1.702 m)   Wt 185 lb (83.9 kg)   SpO2 93%   BMI 28.98 kg/m  BP Readings from Last 3 Encounters:  02/12/22 133/85  01/05/22 (!) 153/77  10/11/21 116/71   Wt Readings from Last 3 Encounters:  02/12/22 185 lb (83.9 kg)  10/11/21 192 lb (87.1 kg)  07/10/21 182 lb (82.6 kg)    Physical Exam  General:  Well Developed, well nourished, appropriate for stated age.  Neuro:  Alert and oriented,  extra-ocular muscles intact  HEENT:  Normocephalic, atraumatic, neck supple  Skin:  no gross rash, warm, pink. Cardiac:  RRR, S1 S2 Respiratory: CTA B/L  Vascular:  Ext warm, no cyanosis apprec.; cap RF less 2 sec. Psych:  No HI/SI, judgement and insight good, Euthymic mood. Full Affect.   Results for orders placed or performed in visit on 02/12/22  POCT HgB A1C  Result Value Ref Range   Hemoglobin A1C 12.6 (A) 4.0 - 5.6 %   HbA1c POC (<> result, manual entry)     HbA1c, POC (prediabetic range)     HbA1c, POC (controlled diabetic range)      Assessment & Plan      Problem List Items Addressed This Visit       Cardiovascular and Mediastinum   Hypertension associated with diabetes ([place])    -Stable. Continue current medication regimen. See med list. Will collect CMP to monitor renal function and electrolytes.       Relevant Medications   glipiZIDE (GLUCOTROL) 5 MG tablet   insulin isophane & regular human KwikPen (HUMULIN 70/30 KWIKPEN) (70-30) 100 UNIT/ML KwikPen   Other Relevant Orders   Comp Met (CMET)   POCT HgB A1C (Completed)     Endocrine   Mixed diabetic hyperlipidemia associated with type 2 diabetes mellitus (HCC)    -Last lipid panel: HDL 42, LDL 66, triglycerides 264 likely secondary to elevated sugars. Will continue with atorvastatin 10 mg daily. Recommend to follow a heart healthy diet low in fat. Recommend repeating fasting lipid panel and hepatic function in  4 months.      Relevant Medications   glipiZIDE (GLUCOTROL) 5 MG tablet   insulin isophane & regular human KwikPen (HUMULIN 70/30 KWIKPEN) (70-30) 100 UNIT/ML KwikPen   Other Relevant Orders   Comp Met (CMET)   Diabetes mellitus ([place]) - Primary    -A1c 12.6, uncontrolled likely secondary to corticosteroid therapy. Discussed with patient avoiding corticosteroid injections until sugars are better controlled. Will increase Humulin 70/30 to 20 units at bedtime and continue with 13 units in the morning. Patient with hx of DKA and pancreatitis so will  avoid SGL2i and GLP-1 therapy. Will start glipizide 5 mg BID with meals and advised to monitor for hypoglycemia. Discussed glipizide is a temporary medication to help lower sugars. Will reassess A1c and medication therapy in 4 weeks.       Relevant Medications   glipiZIDE (GLUCOTROL) 5 MG tablet   insulin isophane & regular human KwikPen (HUMULIN 70/30 KWIKPEN) (70-30) 100 UNIT/ML KwikPen   Other Relevant Orders   Comp Met (CMET)   POCT HgB A1C (Completed)    Return for DM- added new med, mood the week of Nov 13th 2023.        Lorrene Reid, PA-C  [facility] at [facility] [phone] (phone) [phone] (fax)  [place]

## 2022-02-13 LAB — COMPREHENSIVE METABOLIC PANEL
ALT: 29 IU/L (ref 0–32)
AST: 20 IU/L (ref 0–40)
Albumin/Globulin Ratio: 1.5 (ref 1.2–2.2)
Albumin: 4.8 g/dL (ref 3.9–4.9)
Alkaline Phosphatase: 149 IU/L — ABNORMAL HIGH (ref 44–121)
BUN/Creatinine Ratio: 31 — ABNORMAL HIGH (ref 12–28)
BUN: 26 mg/dL (ref 8–27)
Bilirubin Total: 0.7 mg/dL (ref 0.0–1.2)
CO2: 22 mmol/L (ref 20–29)
Calcium: 9.8 mg/dL (ref 8.7–10.3)
Chloride: 95 mmol/L — ABNORMAL LOW (ref 96–106)
Creatinine, Ser: 0.83 mg/dL (ref 0.57–1.00)
Globulin, Total: 3.1 g/dL (ref 1.5–4.5)
Glucose: 424 mg/dL — ABNORMAL HIGH (ref 70–99)
Potassium: 4.1 mmol/L (ref 3.5–5.2)
Sodium: 134 mmol/L (ref 134–144)
Total Protein: 7.9 g/dL (ref 6.0–8.5)
eGFR: 77 mL/min/{1.73_m2} (ref >59)

## 2022-02-19 ENCOUNTER — Telehealth: Payer: Self-pay | Admitting: Physician Assistant

## 2022-02-19 DIAGNOSIS — E1169 Type 2 diabetes mellitus with other specified complication: Secondary | ICD-10-CM

## 2022-02-22 ENCOUNTER — Other Ambulatory Visit: Payer: Self-pay

## 2022-02-22 DIAGNOSIS — E1169 Type 2 diabetes mellitus with other specified complication: Secondary | ICD-10-CM

## 2022-02-22 MED ORDER — ACCU-CHEK GUIDE VI STRP: ORAL_STRIP | 0 refills | Status: DC

## 2022-02-22 MED ORDER — ACCU-CHEK SOFTCLIX LANCETS MISC: 0 refills | Status: DC

## 2022-02-22 NOTE — Telephone Encounter (Signed)
Rx's were sent 02/22/2022

## 2022-03-13 ENCOUNTER — Ambulatory Visit (INDEPENDENT_AMBULATORY_CARE_PROVIDER_SITE_OTHER): Payer: Medicare Other | Admitting: Physician Assistant

## 2022-03-13 ENCOUNTER — Encounter: Payer: Self-pay | Admitting: Physician Assistant

## 2022-03-13 VITALS — BP 132/81 | HR 103 | Resp 18 | Ht 67.0 in | Wt 188.0 lb

## 2022-03-13 DIAGNOSIS — Z23 Encounter for immunization: Secondary | ICD-10-CM | POA: Diagnosis not present

## 2022-03-13 DIAGNOSIS — E1169 Type 2 diabetes mellitus with other specified complication: Secondary | ICD-10-CM

## 2022-03-13 NOTE — Progress Notes (Signed)
Established patient visit   Patient: Mercedes Webb   DOB: 1955/09/03   66 y.o. Female  MRN: [id number] Visit Date: 03/13/2022  Chief Complaint  Patient presents with   Follow-up    Non-Fasting   Diabetes   Subjective    HPI HPI     Follow-up    Additional comments: Non-Fasting      Last edited by Gemma Payor, CMA on 03/13/2022 10:51 AM.      Patient presents for follow-up on diabetes mellitus. Patient reports tolerating glipizide and increased dose of Humulin 70/30 without issues. Denies hypoglycemic events. Reports has noticed sugars are slowly coming down, now are in the 200s, previously 400-500s. Continues with monitoring her sugar and carbohydrate intake.     Medications: Outpatient Medications Prior to Visit  Medication Sig   Accu-Chek Softclix Lancets lancets USE   TO CHECK GLUCOSE 4 TIMES DAILY   Ascorbic Acid (VITAMIN C) 1000 MG tablet Take 1,000 mg by mouth daily.   atorvastatin (LIPITOR) 10 MG tablet TAKE 1 TABLET BY MOUTH NIGHTLY.   B-D UF III MINI PEN NEEDLES 31G X 5 MM MISC USE  THREE TIMES DAILY   blood glucose meter kit and supplies Dispense based on patient and insurance preference. Use to check fasting blood sugar once daily.   Calcium Carbonate (CALTRATE 600 PO) Take 600 mg by mouth daily.   glipiZIDE (GLUCOTROL) 5 MG tablet Take 1 tablet (5 mg total) by mouth 2 (two) times daily before a meal.   glucose blood (ACCU-CHEK GUIDE) test strip USE 1 STRIP TO CHECK SUGAR 4 TIMES DAILY   insulin isophane & regular human KwikPen (HUMULIN 70/30 KWIKPEN) (70-30) 100 UNIT/ML KwikPen Inject 13 units into the skin in the morning and 20 units into the skin at bedtime.   losartan (COZAAR) 25 MG tablet TAKE 1 TABLET BY MOUTH DAILY   Multiple Vitamin (MULTIVITAMIN WITH MINERALS) TABS tablet Take 1 tablet by mouth daily. Centrum Silver   nitrofurantoin, macrocrystal-monohydrate, (MACROBID) 100 MG capsule Take 1 capsule (100 mg total) by mouth 2 (two) times daily.    omega-3 acid ethyl esters (LOVAZA) 1 g capsule Take 2 capsules (2 g total) by mouth 2 (two) times daily.   QC LO-DOSE ASPIRIN 81 MG EC tablet TAKE 1 TABLET (81 MG TOTAL) BY MOUTH DAILY.   triamterene-hydrochlorothiazide (MAXZIDE-25) 37.5-25 MG tablet TAKE 1 TABLET BY MOUTH DAILY.   valACYclovir (VALTREX) 500 MG tablet TAKE 1 TABLET BY MOUTH DAILY.   venlafaxine XR (EFFEXOR-XR) 150 MG 24 hr capsule Take 150 mg by mouth every morning.   zolpidem (AMBIEN CR) 12.5 MG CR tablet Take 12.5 mg by mouth at bedtime.   No facility-administered medications prior to visit.    Review of Systems Review of Systems:  A fourteen system review of systems was performed and found to be positive as per HPI.     Objective    BP 132/81 (BP Location: Left Arm, Patient Position: Sitting, Cuff Size: Large)   Pulse (!) 103   Resp 18   Ht _0  (1.702 m)   Wt 188 lb (85.3 kg)   SpO2 97%   BMI 29.44 kg/m  BP Readings from Last 3 Encounters:  03/13/22 132/81  02/12/22 133/85  01/05/22 (!) 153/77   Wt Readings from Last 3 Encounters:  03/13/22 188 lb (85.3 kg)  02/12/22 185 lb (83.9 kg)  10/11/21 192 lb (87.1 kg)    Physical Exam  General:  Well Developed, well nourished, appropriate for  stated age.  Neuro:  Alert and oriented,  extra-ocular muscles intact  HEENT:  Normocephalic, atraumatic, neck supple  Skin:  no gross rash, warm, pink. Cardiac:  RRR, S1 S2 Respiratory: CTA B/L  Vascular:  Ext warm, no cyanosis apprec.; cap RF less 2 sec. Psych:  No HI/SI, judgement and insight good, Euthymic mood. Full Affect.   No results found for any visits on 03/13/22.  Assessment & Plan      Problem List Items Addressed This Visit       Endocrine   Diabetes mellitus ([place]) - Primary   Other Visit Diagnoses     Need for influenza vaccination       Relevant Orders   Flu Vaccine QUAD High Dose(Fluad) (Completed)      Diabetes mellitus: -A1c repeated today to evaluate for progress and has  reduced from 12.6 to 9.7 in 4 weeks, recommend to continue with glipizide 5 mg twice daily and Humulin 70/30 kwikpen 13 units in the morning and 20 units at bedtime. Discussed with patient recommend glipizide to be a temporary medication until sugars improve and are controlled. A1c goal <7.5. Continue with ambulatory glucose monitoring and diabetic diet. Recommend to avoid any steroid joint injections until sugars are better. Follow-up in 2 months to reassess A1c and medication therapy.   Return in about 2 months (around 05/13/2022) for DM, HTN, HLD.        Lorrene Reid, PA-C  [facility] at [facility] [phone] (phone) [phone] (fax)  [place]

## 2022-03-26 ENCOUNTER — Other Ambulatory Visit: Payer: Self-pay | Admitting: Nurse Practitioner

## 2022-03-26 DIAGNOSIS — E669 Obesity, unspecified: Secondary | ICD-10-CM

## 2022-03-26 DIAGNOSIS — I10 Essential (primary) hypertension: Secondary | ICD-10-CM

## 2022-04-10 ENCOUNTER — Ambulatory Visit
Admission: RE | Admit: 2022-04-10 | Discharge: 2022-04-10 | Disposition: A | Discharge status: Home / Self Care | Payer: Medicare Other | Source: Ambulatory Visit | Attending: Physician Assistant | Admitting: Physician Assistant

## 2022-04-10 DIAGNOSIS — Z0000 Encounter for general adult medical examination without abnormal findings: Secondary | ICD-10-CM

## 2022-04-10 DIAGNOSIS — Z780 Asymptomatic menopausal state: Secondary | ICD-10-CM

## 2022-04-10 DIAGNOSIS — E2839 Other primary ovarian failure: Secondary | ICD-10-CM

## 2022-05-01 ENCOUNTER — Other Ambulatory Visit: Payer: Self-pay | Admitting: Nurse Practitioner

## 2022-05-01 DIAGNOSIS — E782 Mixed hyperlipidemia: Secondary | ICD-10-CM

## 2022-05-01 DIAGNOSIS — E1129 Type 2 diabetes mellitus with other diabetic kidney complication: Secondary | ICD-10-CM

## 2022-05-01 DIAGNOSIS — B009 Herpesviral infection, unspecified: Secondary | ICD-10-CM

## 2022-05-01 DIAGNOSIS — E1159 Type 2 diabetes mellitus with other circulatory complications: Secondary | ICD-10-CM

## 2022-05-14 ENCOUNTER — Ambulatory Visit: Payer: Medicare Other | Admitting: Nurse Practitioner

## 2022-05-15 ENCOUNTER — Other Ambulatory Visit: Payer: Self-pay

## 2022-05-15 ENCOUNTER — Other Ambulatory Visit: Payer: Self-pay | Admitting: Nurse Practitioner

## 2022-05-15 DIAGNOSIS — E1169 Type 2 diabetes mellitus with other specified complication: Secondary | ICD-10-CM

## 2022-05-15 MED ORDER — ACCU-CHEK GUIDE VI STRP: ORAL_STRIP | 1 refills | Status: DC

## 2022-05-15 MED ORDER — ACCU-CHEK SOFTCLIX LANCETS MISC: 1 refills | Status: DC

## 2022-05-15 MED ORDER — BD PEN NEEDLE MINI U/F 31G X 5 MM MISC: 5 refills | Status: DC

## 2022-06-25 ENCOUNTER — Other Ambulatory Visit: Payer: Self-pay | Admitting: Nurse Practitioner

## 2022-06-25 ENCOUNTER — Ambulatory Visit (INDEPENDENT_AMBULATORY_CARE_PROVIDER_SITE_OTHER): Payer: Medicare Other | Admitting: Family Medicine

## 2022-06-25 ENCOUNTER — Encounter: Payer: Self-pay | Admitting: Family Medicine

## 2022-06-25 VITALS — BP 119/80 | HR 103 | Resp 18 | Ht 67.0 in | Wt 193.0 lb

## 2022-06-25 DIAGNOSIS — I152 Hypertension secondary to endocrine disorders: Secondary | ICD-10-CM

## 2022-06-25 DIAGNOSIS — E1129 Type 2 diabetes mellitus with other diabetic kidney complication: Secondary | ICD-10-CM | POA: Diagnosis not present

## 2022-06-25 DIAGNOSIS — E1159 Type 2 diabetes mellitus with other circulatory complications: Secondary | ICD-10-CM

## 2022-06-25 DIAGNOSIS — R809 Proteinuria, unspecified: Secondary | ICD-10-CM

## 2022-06-25 DIAGNOSIS — I10 Essential (primary) hypertension: Secondary | ICD-10-CM

## 2022-06-25 DIAGNOSIS — Z794 Long term (current) use of insulin: Secondary | ICD-10-CM

## 2022-06-25 DIAGNOSIS — E669 Obesity, unspecified: Secondary | ICD-10-CM

## 2022-06-25 DIAGNOSIS — E1169 Type 2 diabetes mellitus with other specified complication: Secondary | ICD-10-CM | POA: Diagnosis not present

## 2022-06-25 DIAGNOSIS — E782 Mixed hyperlipidemia: Secondary | ICD-10-CM

## 2022-06-25 LAB — POCT UA - MICROALBUMIN
Creatinine, POC: 200 mg/dL
Microalbumin Ur, POC: 150 mg/L

## 2022-06-25 NOTE — Assessment & Plan Note (Addendum)
Collected urine micro albumin today.  Will continue to monitor.

## 2022-06-25 NOTE — Assessment & Plan Note (Signed)
Uncontrolled, last A1c in October 2023 was 12.6.  Checking serum A1c today as we are out of point-of-care tests.  We discussed that depending on the A1c results we may change her medication.  She would like to start a GLP-1 like Mounjaro.  We discussed its mechanism of action, indications, and potential side effects.  She is agreeable to starting it if her A1c is still high.  She is aware that this may be while continuing the glipizide or instead of the glipizide.  We will call her with the results of her labs and which of these options will be best based on A1c results.

## 2022-06-25 NOTE — Assessment & Plan Note (Addendum)
Stable.  Ordered lipid panel today, she is coming back tomorrow morning when she is fasting to have her lab work drawn.  Continue atorvastatin 10 mg daily unless lipid panel results warrant change in medication.  Will continue to monitor.

## 2022-06-25 NOTE — Progress Notes (Signed)
Established Patient Office Visit  Subjective   Patient ID: Mercedes Webb, female    DOB: 1955/10/12  Age: 67 y.o. MRN: [id number]  Chief Complaint  Patient presents with   Medical Management of Chronic Issues    HPI Mercedes Webb is a 67 y.o. female presenting today for follow up of diabetes, hypertension, hyperlipidemia. Diabetes: denies hypoglycemic events, wounds or sores that are not healing well, increased thirst or urination. Denies vision problems. Checking glucose at home, ranges have been 300s-400s. Taking glipizide 5 mg twice daily and Humulin 70/30 KwikPen 13 units in the morning and 20 units at night as prescribed without any side effects.  She is frustrated with her sugars still being so out of control even after adding the glipizide.  She would like to discuss adding something like Ozempic or Mounjaro as she has seen commercials on TV and heard of people having a lot of success with those.  She would like to get her sugars under control so that she is able to get steroid injections for her bilateral hip osteoarthritis. Hypertension: Patient here for follow-up of elevated blood pressure. She is not exercising and is adherent to low salt diet.  Blood pressure is well controlled at home. Pt denies chest pain, SOB, dizziness, edema, syncope, fatigue or heart palpitations. Taking losartan and triamterene-hydrochlorothiazide, reports excellent compliance with treatment. Denies side effects. Hyperlipidemia: tolerating atorvastatin well with no myalgias or significant side effects. Currently consuming a general diet.  The 10-year ASCVD risk score (Arnett DK, et al., 2019) is: 15.9%  ROS Negative unless otherwise noted in HPI   Objective:     BP 119/80 (BP Location: Left Arm, Patient Position: Sitting, Cuff Size: Large)   Pulse (!) 103   Resp 18   Ht '5\' 7"'$  (1.702 m)   Wt 193 lb (87.5 kg)   SpO2 96%   BMI 30.23 kg/m   Physical Exam Constitutional:      General: She is  not in acute distress.    Appearance: Normal appearance.  HENT:     Head: Normocephalic and atraumatic.  Cardiovascular:     Rate and Rhythm: Normal rate and regular rhythm.     Pulses: Normal pulses.     Heart sounds: No murmur heard.    No friction rub. No gallop.  Pulmonary:     Effort: Pulmonary effort is normal. No respiratory distress.     Breath sounds: No wheezing, rhonchi or rales.  Skin:    General: Skin is warm and dry.  Neurological:     Mental Status: She is alert and oriented to person, place, and time.     Assessment & Plan:  Hypertension associated with diabetes ([place]) Assessment & Plan: Stable.  Elevated slightly at 139/82 upon initial presentation but within goal at 119/80 on recheck.  Continue monitoring blood pressure at home.  Continue losartan and triamterene-hydrochlorothiazide.  CMP ordered today.  Will continue to monitor.  Orders: -     CBC with Differential/Platelet; Future -     Comprehensive metabolic panel; Future  Mixed diabetic hyperlipidemia associated with type 2 diabetes mellitus ([place]) Assessment & Plan: Stable.  Ordered lipid panel today, she is coming back tomorrow morning when she is fasting to have her lab work drawn.  Continue atorvastatin 10 mg daily unless lipid panel results warrant change in medication.  Will continue to monitor.  Orders: -     Lipid panel; Future  Type 2 diabetes mellitus with other specified complication, with  long-term current use of insulin [facility]) Assessment & Plan: Uncontrolled, last A1c in October 2023 was 12.6.  Checking serum A1c today as we are out of point-of-care tests.  We discussed that depending on the A1c results we may change her medication.  She would like to start a GLP-1 like Mounjaro.  We discussed its mechanism of action, indications, and potential side effects.  She is agreeable to starting it if her A1c is still high.  She is aware that this may be while continuing the glipizide or instead of the  glipizide.  We will call her with the results of her labs and which of these options will be best based on A1c results.  Orders: -     Hemoglobin A1c; Future -     Lipid panel; Future -     POCT UA - Microalbumin  Microalbuminuria due to type 2 diabetes mellitus ([place]) Assessment & Plan: Collected urine micro albumin today.  Will continue to monitor.   Return in about 2 months (around 08/24/2022) for follow-up for diabetes and new medication.    Velva Harman, PA

## 2022-06-25 NOTE — Assessment & Plan Note (Signed)
Stable.  Elevated slightly at 139/82 upon initial presentation but within goal at 119/80 on recheck.  Continue monitoring blood pressure at home.  Continue losartan and triamterene-hydrochlorothiazide.  CMP ordered today.  Will continue to monitor.

## 2022-06-26 ENCOUNTER — Other Ambulatory Visit: Payer: Medicare Other

## 2022-06-26 DIAGNOSIS — E1165 Type 2 diabetes mellitus with hyperglycemia: Secondary | ICD-10-CM

## 2022-06-26 DIAGNOSIS — E1169 Type 2 diabetes mellitus with other specified complication: Secondary | ICD-10-CM

## 2022-06-26 DIAGNOSIS — Z794 Long term (current) use of insulin: Secondary | ICD-10-CM

## 2022-06-26 DIAGNOSIS — I152 Hypertension secondary to endocrine disorders: Secondary | ICD-10-CM

## 2022-06-26 DIAGNOSIS — E1159 Type 2 diabetes mellitus with other circulatory complications: Secondary | ICD-10-CM

## 2022-06-26 DIAGNOSIS — E781 Pure hyperglyceridemia: Secondary | ICD-10-CM

## 2022-06-27 ENCOUNTER — Other Ambulatory Visit: Payer: Self-pay | Admitting: Family Medicine

## 2022-06-27 DIAGNOSIS — E781 Pure hyperglyceridemia: Secondary | ICD-10-CM

## 2022-06-27 LAB — LIPID PANEL
Chol/HDL Ratio: 4.5 ratio — ABNORMAL HIGH (ref 0.0–4.4)
Cholesterol, Total: 154 mg/dL (ref 100–199)
HDL: 34 mg/dL — ABNORMAL LOW (ref >39)
LDL Chol Calc (NIH): 57 mg/dL (ref 0–99)
Triglycerides: 413 mg/dL — ABNORMAL HIGH (ref 0–149)
VLDL Cholesterol Cal: 63 mg/dL — ABNORMAL HIGH (ref 5–40)

## 2022-06-27 LAB — COMPREHENSIVE METABOLIC PANEL
ALT: 49 IU/L — ABNORMAL HIGH (ref 0–32)
AST: 26 IU/L (ref 0–40)
Albumin/Globulin Ratio: 1.6 (ref 1.2–2.2)
Albumin: 4.5 g/dL (ref 3.9–4.9)
Alkaline Phosphatase: 154 IU/L — ABNORMAL HIGH (ref 44–121)
BUN/Creatinine Ratio: 21 (ref 12–28)
BUN: 21 mg/dL (ref 8–27)
Bilirubin Total: 0.5 mg/dL (ref 0.0–1.2)
CO2: 20 mmol/L (ref 20–29)
Calcium: 10.1 mg/dL (ref 8.7–10.3)
Chloride: 98 mmol/L (ref 96–106)
Creatinine, Ser: 0.99 mg/dL (ref 0.57–1.00)
Globulin, Total: 2.9 g/dL (ref 1.5–4.5)
Glucose: 452 mg/dL — ABNORMAL HIGH (ref 70–99)
Potassium: 4.5 mmol/L (ref 3.5–5.2)
Sodium: 137 mmol/L (ref 134–144)
Total Protein: 7.4 g/dL (ref 6.0–8.5)
eGFR: 62 mL/min/{1.73_m2} (ref >59)

## 2022-06-27 LAB — CBC WITH DIFFERENTIAL/PLATELET
Basophils Absolute: 0.1 10*3/uL (ref 0.0–0.2)
Basos: 1 %
EOS (ABSOLUTE): 0 10*3/uL (ref 0.0–0.4)
Eos: 0 %
Hematocrit: 44.7 % (ref 34.0–46.6)
Hemoglobin: 15 g/dL (ref 11.1–15.9)
Immature Grans (Abs): 0 10*3/uL (ref 0.0–0.1)
Immature Granulocytes: 1 %
Lymphocytes Absolute: 1.5 10*3/uL (ref 0.7–3.1)
Lymphs: 25 %
MCH: 30.1 pg (ref 26.6–33.0)
MCHC: 33.6 g/dL (ref 31.5–35.7)
MCV: 90 fL (ref 79–97)
Monocytes Absolute: 0.4 10*3/uL (ref 0.1–0.9)
Monocytes: 6 %
Neutrophils Absolute: 4.2 10*3/uL (ref 1.4–7.0)
Neutrophils: 67 %
Platelets: 290 10*3/uL (ref 150–450)
RBC: 4.99 x10E6/uL (ref 3.77–5.28)
RDW: 13.9 % (ref 11.7–15.4)
WBC: 6.1 10*3/uL (ref 3.4–10.8)

## 2022-06-27 LAB — HEMOGLOBIN A1C
Est. average glucose Bld gHb Est-mCnc: 237 mg/dL
Hgb A1c MFr Bld: 9.9 % — ABNORMAL HIGH (ref 4.8–5.6)

## 2022-06-27 MED ORDER — FENOFIBRATE 50 MG PO CAPS: 50.0000 mg | ORAL_CAPSULE | Freq: Every day | ORAL | 1 refills | Status: DC

## 2022-06-27 MED ORDER — TIRZEPATIDE 2.5 MG/0.5ML ~~LOC~~ SOAJ: 2.5000 mg | SUBCUTANEOUS | 1 refills | Status: DC

## 2022-06-27 MED ORDER — FENOFIBRATE 48 MG PO TABS: 48.0000 mg | ORAL_TABLET | Freq: Every day | ORAL | 1 refills | Status: DC

## 2022-06-27 NOTE — Addendum Note (Signed)
Addended by: Virgil Benedict on: 06/27/2022 08:38 AM   Modules accepted: Orders

## 2022-06-27 NOTE — Progress Notes (Signed)
Discussed lab results with patient on the phone. A1c has gone down from where it was 4 months ago, but at 9.9 it is still much higher than where we want it to be.  We had previously discussed the possibility of starting Mounjaro.  This is still an option for controlling blood sugars and lowering the A1c, but we did discuss that given her history of pancreatitis and current high levels of triglycerides at 413 it does increase the risk of another episode of pancreatitis.  It is not an absolute contraindication, but given the increased risk it is important that the moment that she feels any sort of symptoms starting that she stopped the medication immediately.  Patient expressed understanding but would like to proceed with Mounjaro.  We will start Mounjaro, discontinue glipizide, and continue basal insulin routine.  She is also agreeable to starting a fibrate medication to get her triglycerides lower to help mitigate the risk of pancreatitis further.  Sending in prescriptions for [facility] and fibrate medicine.  We will follow-up with fasting blood work in 2 months.  At that time we will also recheck CMP for alkaline phosphatase and ALT.  Patient expressed understanding and is agreeable to this plan.

## 2022-08-20 ENCOUNTER — Other Ambulatory Visit: Payer: Self-pay | Admitting: Family Medicine

## 2022-08-20 DIAGNOSIS — E781 Pure hyperglyceridemia: Secondary | ICD-10-CM

## 2022-08-24 ENCOUNTER — Encounter: Payer: Self-pay | Admitting: Family Medicine

## 2022-08-24 ENCOUNTER — Ambulatory Visit (INDEPENDENT_AMBULATORY_CARE_PROVIDER_SITE_OTHER): Payer: Medicare Other | Admitting: Family Medicine

## 2022-08-24 VITALS — BP 129/79 | HR 99 | Resp 18 | Ht 67.0 in | Wt 188.0 lb

## 2022-08-24 DIAGNOSIS — I152 Hypertension secondary to endocrine disorders: Secondary | ICD-10-CM | POA: Diagnosis not present

## 2022-08-24 DIAGNOSIS — E782 Mixed hyperlipidemia: Secondary | ICD-10-CM

## 2022-08-24 DIAGNOSIS — E1169 Type 2 diabetes mellitus with other specified complication: Secondary | ICD-10-CM

## 2022-08-24 DIAGNOSIS — Z794 Long term (current) use of insulin: Secondary | ICD-10-CM

## 2022-08-24 DIAGNOSIS — E1159 Type 2 diabetes mellitus with other circulatory complications: Secondary | ICD-10-CM

## 2022-08-24 MED ORDER — TIRZEPATIDE 7.5 MG/0.5ML ~~LOC~~ SOAJ
7.5000 mg | SUBCUTANEOUS | 2 refills | Status: DC
Start: 2022-09-19 — End: 2022-11-14

## 2022-08-24 MED ORDER — TIRZEPATIDE 5 MG/0.5ML ~~LOC~~ SOAJ
5.0000 mg | SUBCUTANEOUS | 1 refills | Status: DC
Start: 2022-08-24 — End: 2022-11-14

## 2022-08-24 NOTE — Assessment & Plan Note (Signed)
Last lipid panel: LDL 57, HDL 34, triglycerides 413.  Continue atorvastatin 10 mg daily, fenofibrate 48 mg daily.  Rechecking lipid panel today.  Will continue to monitor.

## 2022-08-24 NOTE — Patient Instructions (Signed)
Take the 5 mg weekly dose for 1 month.  If tolerating it well, you can go up to the 7.5 mg weekly dose.

## 2022-08-24 NOTE — Assessment & Plan Note (Signed)
Tolerating Mounjaro well.  Continue glipizide 5 mg daily, Mounjaro 5 mg weekly, and insulin 13 units in the morning and 20 units at night.  After 1 month if tolerating Mounjaro well can increase to 7.5 mg weekly.  Fasting blood sugar has been high in the 300s, so may consider checking 3 AM glucose to determine if we need to make any changes to the overnight basal insulin.

## 2022-08-24 NOTE — Assessment & Plan Note (Signed)
Stable, at goal.  Continue losartan and triamterene-hydrochlorothiazide 37.5-25 mg daily.  Will continue to monitor.

## 2022-08-24 NOTE — Progress Notes (Signed)
Established Patient Office Visit  Subjective   Patient ID: Mercedes Webb, female    DOB: 1955/09/19  Age: 67 y.o. MRN: [id number]  Chief Complaint  Patient presents with   Diabetes    HPI JEFFREY VOTH is a 67 y.o. female presenting today for follow up of hypertension, hyperlipidemia, diabetes. Hypertension: Patient here for follow-up of elevated blood pressure.  Pt denies chest pain, SOB, dizziness, edema, syncope, fatigue or heart palpitations. Taking losartan and triamterene-hydrochlorothiazide, reports excellent compliance with treatment. Denies side effects. Hyperlipidemia: tolerating atorvastatin well with no myalgias or significant side effects.  Tolerating fenofibrate well. The 10-year ASCVD risk score (Arnett DK, et al., 2019) is: 19.7% Diabetes: denies hypoglycemic events, wounds or sores that are not healing well, increased thirst or urination. Denies vision problems, eye exam due. Checking glucose at home, ranges have been around 345 fasting consistently. Taking Mounjaro and glipizide as prescribed without any side effects.  She is tolerating the medications well and denies any nausea or vomiting, abdominal pain, diarrhea  ROS Negative unless otherwise noted in HPI   Objective:     BP 129/79 (BP Location: Left Arm, Patient Position: Sitting, Cuff Size: Normal)   Pulse 99   Resp 18   Ht 5\' 7"  (1.702 m)   Wt 188 lb (85.3 kg)   SpO2 95%   BMI 29.44 kg/m   Physical Exam Constitutional:      General: She is not in acute distress.    Appearance: Normal appearance.  HENT:     Head: Normocephalic and atraumatic.  Cardiovascular:     Rate and Rhythm: Normal rate and regular rhythm.     Pulses: Normal pulses.     Heart sounds: No murmur heard.    No friction rub. No gallop.  Pulmonary:     Effort: Pulmonary effort is normal. No respiratory distress.     Breath sounds: No wheezing, rhonchi or rales.  Skin:    General: Skin is warm and dry.  Neurological:      Mental Status: She is alert and oriented to person, place, and time.  Psychiatric:        Mood and Affect: Mood normal.        Behavior: Behavior normal.     Assessment & Plan:  Type 2 diabetes mellitus with other specified complication, with long-term current use of insulin (HCC) Assessment & Plan: Tolerating Mounjaro well.  Continue glipizide 5 mg daily, Mounjaro 5 mg weekly, and insulin 13 units in the morning and 20 units at night.  After 1 month if tolerating Mounjaro well can increase to 7.5 mg weekly.  Fasting blood sugar has been high in the 300s, so may consider checking 3 AM glucose to determine if we need to make any changes to the overnight basal insulin.  Orders: -     CBC with Differential/Platelet; Future -     Comprehensive metabolic panel; Future -     Hemoglobin A1c; Future -     Lipid panel; Future -     Tirzepatide; Inject 5 mg into the skin once a week.  Dispense: 2 mL; Refill: 1 -     Tirzepatide; Inject 7.5 mg into the skin once a week.  Dispense: 2 mL; Refill: 2  Mixed diabetic hyperlipidemia associated with type 2 diabetes mellitus (HCC) Assessment & Plan: Last lipid panel: LDL 57, HDL 34, triglycerides 413.  Continue atorvastatin 10 mg daily, fenofibrate 48 mg daily.  Rechecking lipid panel today.  Will continue  to monitor.  Orders: -     CBC with Differential/Platelet; Future -     Comprehensive metabolic panel; Future -     Hemoglobin A1c; Future -     Lipid panel; Future  Hypertension associated with diabetes [facility]) Assessment & Plan: Stable, at goal.  Continue losartan and triamterene-hydrochlorothiazide 37.5-25 mg daily.  Will continue to monitor.   Return in about 3 months (around 11/23/2022) for follow-up for DM, HTN, HLD.    [name], PA

## 2022-08-25 LAB — COMPREHENSIVE METABOLIC PANEL
ALT: 48 IU/L — ABNORMAL HIGH (ref 0–32)
AST: 29 IU/L (ref 0–40)
Albumin/Globulin Ratio: 1.7 (ref 1.2–2.2)
Albumin: 4.8 g/dL (ref 3.9–4.9)
Alkaline Phosphatase: 133 IU/L — ABNORMAL HIGH (ref 44–121)
BUN/Creatinine Ratio: 15 (ref 12–28)
BUN: 15 mg/dL (ref 8–27)
Bilirubin Total: 0.6 mg/dL (ref 0.0–1.2)
CO2: 20 mmol/L (ref 20–29)
Calcium: 10.3 mg/dL (ref 8.7–10.3)
Chloride: 94 mmol/L — ABNORMAL LOW (ref 96–106)
Creatinine, Ser: 0.97 mg/dL (ref 0.57–1.00)
Globulin, Total: 2.8 g/dL (ref 1.5–4.5)
Glucose: 355 mg/dL — ABNORMAL HIGH (ref 70–99)
Potassium: 3.9 mmol/L (ref 3.5–5.2)
Sodium: 135 mmol/L (ref 134–144)
Total Protein: 7.6 g/dL (ref 6.0–8.5)
eGFR: 64 mL/min/{1.73_m2} (ref >59)

## 2022-08-25 LAB — CBC WITH DIFFERENTIAL/PLATELET
Basophils Absolute: 0.1 10*3/uL (ref 0.0–0.2)
Basos: 1 %
EOS (ABSOLUTE): 0 10*3/uL (ref 0.0–0.4)
Eos: 0 %
Hematocrit: 46.5 % (ref 34.0–46.6)
Hemoglobin: 15.6 g/dL (ref 11.1–15.9)
Immature Grans (Abs): 0 10*3/uL (ref 0.0–0.1)
Immature Granulocytes: 0 %
Lymphocytes Absolute: 1.8 10*3/uL (ref 0.7–3.1)
Lymphs: 30 %
MCH: 30.4 pg (ref 26.6–33.0)
MCHC: 33.5 g/dL (ref 31.5–35.7)
MCV: 91 fL (ref 79–97)
Monocytes Absolute: 0.4 10*3/uL (ref 0.1–0.9)
Monocytes: 6 %
Neutrophils Absolute: 3.8 10*3/uL (ref 1.4–7.0)
Neutrophils: 63 %
Platelets: 365 10*3/uL (ref 150–450)
RBC: 5.14 x10E6/uL (ref 3.77–5.28)
RDW: 13.6 % (ref 11.7–15.4)
WBC: 6.1 10*3/uL (ref 3.4–10.8)

## 2022-08-25 LAB — LIPID PANEL
Chol/HDL Ratio: 3.7 ratio (ref 0.0–4.4)
Cholesterol, Total: 132 mg/dL (ref 100–199)
HDL: 36 mg/dL — ABNORMAL LOW (ref >39)
LDL Chol Calc (NIH): 61 mg/dL (ref 0–99)
Triglycerides: 212 mg/dL — ABNORMAL HIGH (ref 0–149)
VLDL Cholesterol Cal: 35 mg/dL (ref 5–40)

## 2022-08-25 LAB — HEMOGLOBIN A1C
Est. average glucose Bld gHb Est-mCnc: 243 mg/dL
Hgb A1c MFr Bld: 10.1 % — ABNORMAL HIGH (ref 4.8–5.6)

## 2022-09-11 DIAGNOSIS — H02889 Meibomian gland dysfunction of unspecified eye, unspecified eyelid: Secondary | ICD-10-CM | POA: Diagnosis not present

## 2022-09-11 DIAGNOSIS — H04123 Dry eye syndrome of bilateral lacrimal glands: Secondary | ICD-10-CM | POA: Diagnosis not present

## 2022-09-11 DIAGNOSIS — E119 Type 2 diabetes mellitus without complications: Secondary | ICD-10-CM | POA: Diagnosis not present

## 2022-09-25 ENCOUNTER — Other Ambulatory Visit: Payer: Self-pay | Admitting: Family Medicine

## 2022-09-25 DIAGNOSIS — E669 Obesity, unspecified: Secondary | ICD-10-CM

## 2022-09-25 DIAGNOSIS — I10 Essential (primary) hypertension: Secondary | ICD-10-CM

## 2022-09-25 DIAGNOSIS — E781 Pure hyperglyceridemia: Secondary | ICD-10-CM

## 2022-10-01 ENCOUNTER — Other Ambulatory Visit: Payer: Self-pay | Admitting: Nurse Practitioner

## 2022-10-01 DIAGNOSIS — E1169 Type 2 diabetes mellitus with other specified complication: Secondary | ICD-10-CM

## 2022-10-04 ENCOUNTER — Ambulatory Visit (INDEPENDENT_AMBULATORY_CARE_PROVIDER_SITE_OTHER): Payer: Medicare Other

## 2022-10-04 VITALS — Ht 67.0 in | Wt 188.0 lb

## 2022-10-04 DIAGNOSIS — Z1159 Encounter for screening for other viral diseases: Secondary | ICD-10-CM

## 2022-10-04 DIAGNOSIS — Z0000 Encounter for general adult medical examination without abnormal findings: Secondary | ICD-10-CM | POA: Diagnosis not present

## 2022-10-04 NOTE — Addendum Note (Signed)
Addended by: Saralyn Pilar on: 10/04/2022 02:58 PM   Modules accepted: Orders

## 2022-10-04 NOTE — Patient Instructions (Addendum)
Mercedes Webb , Thank you for taking time to come for your Medicare Wellness Visit. I appreciate your ongoing commitment to your health goals. Please review the following plan we discussed and let me know if I can assist you in the future.   These are the goals we discussed:  Goals       Lose weight (pt-stated)        This is a list of the screening recommended for you and due dates:  Health Maintenance  Topic Date Due   Eye exam for diabetics  Never done   Hepatitis C Screening  Never done   Complete foot exam   01/09/2022   COVID-19 Vaccine (4 - 2023-24 season) 10/20/2022*   Flu Shot  11/29/2022   Hemoglobin A1C  02/23/2023   Yearly kidney health urinalysis for diabetes  06/26/2023   Yearly kidney function blood test for diabetes  08/24/2023   Medicare Annual Wellness Visit  10/04/2023   Mammogram  11/02/2023   Colon Cancer Screening  02/06/2025   DTaP/Tdap/Td vaccine (2 - Td or Tdap) 10/12/2031   Pneumonia Vaccine  Completed   DEXA scan (bone density measurement)  Completed   Zoster (Shingles) Vaccine  Completed   HPV Vaccine  Aged Out  *Topic was postponed. The date shown is not the original due date.    Advanced directives: Advance directive discussed with you today. Even though you declined this today, please call our office should you change your mind, and we can give you the proper paperwork for you to fill out.   Conditions/risks identified: None  Next appointment: Follow up in one year for your annual wellness visit   Preventive Care 65 Years and Older, Female Preventive care refers to lifestyle choices and visits with your health care provider that can promote health and wellness. What does preventive care include? A yearly physical exam. This is also called an annual well check. Dental exams once or twice a year. Routine eye exams. Ask your health care provider how often you should have your eyes checked. Personal lifestyle choices, including: Daily care of your  teeth and gums. Regular physical activity. Eating a healthy diet. Avoiding tobacco and drug use. Limiting alcohol use. Practicing safe sex. Taking low-dose aspirin every day. Taking vitamin and mineral supplements as recommended by your health care provider. What happens during an annual well check? The services and screenings done by your health care provider during your annual well check will depend on your age, overall health, lifestyle risk factors, and family history of disease. Counseling  Your health care provider may ask you questions about your: Alcohol use. Tobacco use. Drug use. Emotional well-being. Home and relationship well-being. Sexual activity. Eating habits. History of falls. Memory and ability to understand (cognition). Work and work Astronomer. Reproductive health. Screening  You may have the following tests or measurements: Height, weight, and BMI. Blood pressure. Lipid and cholesterol levels. These may be checked every 5 years, or more frequently if you are over 32 years old. Skin check. Lung cancer screening. You may have this screening every year starting at age 32 if you have a 30-pack-year history of smoking and currently smoke or have quit within the past 15 years. Fecal occult blood test (FOBT) of the stool. You may have this test every year starting at age 91. Flexible sigmoidoscopy or colonoscopy. You may have a sigmoidoscopy every 5 years or a colonoscopy every 10 years starting at age 9. Hepatitis C blood test. Hepatitis B blood  test. Sexually transmitted disease (STD) testing. Diabetes screening. This is done by checking your blood sugar (glucose) after you have not eaten for a while (fasting). You may have this done every 1-3 years. Bone density scan. This is done to screen for osteoporosis. You may have this done starting at age 65. Mammogram. This may be done every 1-2 years. Talk to your health care provider about how often you should have  regular mammograms. Talk with your health care provider about your test results, treatment options, and if necessary, the need for more tests. Vaccines  Your health care provider may recommend certain vaccines, such as: Influenza vaccine. This is recommended every year. Tetanus, diphtheria, and acellular pertussis (Tdap, Td) vaccine. You may need a Td booster every 10 years. Zoster vaccine. You may need this after age 52. Pneumococcal 13-valent conjugate (PCV13) vaccine. One dose is recommended after age 31. Pneumococcal polysaccharide (PPSV23) vaccine. One dose is recommended after age 54. Talk to your health care provider about which screenings and vaccines you need and how often you need them. This information is not intended to replace advice given to you by your health care provider. Make sure you discuss any questions you have with your health care provider. Document Released: 05/13/2015 Document Revised: 01/04/2016 Document Reviewed: 02/15/2015 Elsevier Interactive Patient Education  2017 [employer].  Fall Prevention in the Home Falls can cause injuries. They can happen to people of all ages. There are many things you can do to make your home safe and to help prevent falls. What can I do on the outside of my home? Regularly fix the edges of walkways and driveways and fix any cracks. Remove anything that might make you trip as you walk through a door, such as a raised step or threshold. Trim any bushes or trees on the path to your home. Use bright outdoor lighting. Clear any walking paths of anything that might make someone trip, such as rocks or tools. Regularly check to see if handrails are loose or broken. Make sure that both sides of any steps have handrails. Any raised decks and porches should have guardrails on the edges. Have any leaves, snow, or ice cleared regularly. Use sand or salt on walking paths during winter. Clean up any spills in your garage right away. This  includes oil or grease spills. What can I do in the bathroom? Use night lights. Install grab bars by the toilet and in the tub and shower. Do not use towel bars as grab bars. Use non-skid mats or decals in the tub or shower. If you need to sit down in the shower, use a plastic, non-slip stool. Keep the floor dry. Clean up any water that spills on the floor as soon as it happens. Remove soap buildup in the tub or shower regularly. Attach bath mats securely with double-sided non-slip rug tape. Do not have throw rugs and other things on the floor that can make you trip. What can I do in the bedroom? Use night lights. Make sure that you have a light by your bed that is easy to reach. Do not use any sheets or blankets that are too big for your bed. They should not hang down onto the floor. Have a firm chair that has side arms. You can use this for support while you get dressed. Do not have throw rugs and other things on the floor that can make you trip. What can I do in the kitchen? Clean up any spills right  away. Avoid walking on wet floors. Keep items that you use a lot in easy-to-reach places. If you need to reach something above you, use a strong step stool that has a grab bar. Keep electrical cords out of the way. Do not use floor polish or wax that makes floors slippery. If you must use wax, use non-skid floor wax. Do not have throw rugs and other things on the floor that can make you trip. What can I do with my stairs? Do not leave any items on the stairs. Make sure that there are handrails on both sides of the stairs and use them. Fix handrails that are broken or loose. Make sure that handrails are as long as the stairways. Check any carpeting to make sure that it is firmly attached to the stairs. Fix any carpet that is loose or worn. Avoid having throw rugs at the top or bottom of the stairs. If you do have throw rugs, attach them to the floor with carpet tape. Make sure that you  have a light switch at the top of the stairs and the bottom of the stairs. If you do not have them, ask someone to add them for you. What else can I do to help prevent falls? Wear shoes that: Do not have high heels. Have rubber bottoms. Are comfortable and fit you well. Are closed at the toe. Do not wear sandals. If you use a stepladder: Make sure that it is fully opened. Do not climb a closed stepladder. Make sure that both sides of the stepladder are locked into place. Ask someone to hold it for you, if possible. Clearly mark and make sure that you can see: Any grab bars or handrails. First and last steps. Where the edge of each step is. Use tools that help you move around (mobility aids) if they are needed. These include: Canes. Walkers. Scooters. Crutches. Turn on the lights when you go into a dark area. Replace any light bulbs as soon as they burn out. Set up your furniture so you have a clear path. Avoid moving your furniture around. If any of your floors are uneven, fix them. If there are any pets around you, be aware of where they are. Review your medicines with your doctor. Some medicines can make you feel dizzy. This can increase your chance of falling. Ask your doctor what other things that you can do to help prevent falls. This information is not intended to replace advice given to you by your health care provider. Make sure you discuss any questions you have with your health care provider. Document Released: 02/10/2009 Document Revised: 09/22/2015 Document Reviewed: 05/21/2014 Elsevier Interactive Patient Education  2017 [employer].

## 2022-10-04 NOTE — Progress Notes (Signed)
Subjective:   Mercedes Webb is a 67 y.o. female who presents for Medicare Annual (Subsequent) preventive examination.  Review of Systems    Virtual Visit via Telephone Note  I connected with  Mercedes Webb on 10/04/22 at  2:30 PM EDT by telephone and verified that I am speaking with the correct person using two identifiers.  Location: Patient: Home Provider: Office Persons participating in the virtual visit: patient/Nurse Health Advisor   I discussed the limitations, risks, security and privacy concerns of performing an evaluation and management service by telephone and the availability of in person appointments. The patient expressed understanding and agreed to proceed.  Interactive audio and video telecommunications were attempted between this nurse and patient, however failed, due to patient having technical difficulties OR patient did not have access to video capability.  We continued and completed visit with audio only.  Some vital signs may be absent or patient reported.   [name], LPN  Cardiac Risk Factors include: advanced age (>74men, >1 women);diabetes mellitus     Objective:    Today's Vitals   10/04/22 1438 10/04/22 1443  Weight: 188 lb (85.3 kg)   Height: 5\' 7"  (1.702 m)   PainSc:  0-No pain   Body mass index is 29.44 kg/m.     10/04/2022    2:49 PM 01/14/2021    1:00 AM 01/13/2021    5:34 PM 08/11/2019   12:27 PM 11/12/2016    8:18 AM 02/03/2016   11:55 AM 01/31/2016    6:20 AM  Advanced Directives  Does Patient Have a Medical Advance Directive? No Unable to assess, patient is non-responsive or altered mental status Yes No No No No  Type of Advance Directive   Living will      Would patient like information on creating a medical advance directive? No - Patient declined   No - Patient declined No - Patient declined No - patient declined information     Current Medications (verified) Outpatient Encounter Medications as of 10/04/2022   Medication Sig   Accu-Chek Softclix Lancets lancets USE   TO CHECK GLUCOSE 4 TIMES DAILY   Ascorbic Acid (VITAMIN C) 1000 MG tablet Take 1,000 mg by mouth daily.   ASPIRIN ADULT LOW STRENGTH 81 MG tablet TAKE 1 TABLET (81 MG TOTAL) BY MOUTH DAILY.   atorvastatin (LIPITOR) 10 MG tablet TAKE 1 TABLET BY MOUTH NIGHTLY.   blood glucose meter kit and supplies Dispense based on patient and insurance preference. Use to check fasting blood sugar once daily.   Calcium Carbonate (CALTRATE 600 PO) Take 600 mg by mouth daily.   fenofibrate (TRICOR) 48 MG tablet TAKE 1 TABLET BY MOUTH DAILY.   glipiZIDE (GLUCOTROL) 5 MG tablet TAKE 1 TABLET BY MOUTH 2 TIMES DAILY BEFORE A MEAL.   glucose blood (ACCU-CHEK GUIDE) test strip USE 1 STRIP TO CHECK SUGAR 4 TIMES DAILY   insulin isophane & regular human KwikPen (HUMULIN 70/30 KWIKPEN) (70-30) 100 UNIT/ML KwikPen INJECT 13 UNITS INTO THE SKIN IN THE MORNING AND 20 UNITS INTO THE SKIN AT BEDTIME.   Insulin Pen Needle (B-D UF III MINI PEN NEEDLES) 31G X 5 MM MISC USE  THREE TIMES DAILY   losartan (COZAAR) 25 MG tablet TAKE 1 TABLET BY MOUTH DAILY   Multiple Vitamin (MULTIVITAMIN WITH MINERALS) TABS tablet Take 1 tablet by mouth daily. Centrum Silver   omega-3 acid ethyl esters (LOVAZA) 1 g capsule Take 2 capsules (2 g total) by mouth 2 (two)  times daily.   tirzepatide [name]) 5 MG/0.5ML Pen Inject 5 mg into the skin once a week.   tirzepatide (MOUNJARO) 7.5 MG/0.5ML Pen Inject 7.5 mg into the skin once a week.   triamterene-hydrochlorothiazide (MAXZIDE-25) 37.5-25 MG tablet TAKE 1 TABLET BY MOUTH DAILY.   valACYclovir (VALTREX) 500 MG tablet TAKE 1 TABLET BY MOUTH DAILY.   venlafaxine XR (EFFEXOR-XR) 150 MG 24 hr capsule Take 150 mg by mouth every morning.   zolpidem (AMBIEN CR) 12.5 MG CR tablet Take 12.5 mg by mouth at bedtime.   No facility-administered encounter medications on file as of 10/04/2022.    Allergies (verified) Prozac [fluoxetine hcl]    History: Past Medical History:  Diagnosis Date   Anxiety    Cancer (HCC) 2016   Skin CA squamous cell on bilateral feet removed   Cervical dysplasia    h/o   Colon polyps    polyps on first scope ~ 2006, no recurrent polyps ~ 2001 and in 2016.    Diabetes mellitus without complication (HCC)    Endometriosis    Family history of adverse reaction to anesthesia    Mother had difficulty waking & nausea   Headache    Hypertension    Insomnia    Pancreatitis 01/2016   Recurrent cold sores    Past Surgical History:  Procedure Laterality Date   ABDOMINAL HYSTERECTOMY     tah/bso- endometriosis   CHOLECYSTECTOMY     ERCP N/A 02/03/2016   Procedure: ENDOSCOPIC RETROGRADE CHOLANGIOPANCREATOGRAPHY (ERCP);  Surgeon: Jeani Hawking, MD;  Location: [facility] ENDOSCOPY;  Service: Endoscopy;  Laterality: N/A;   KNEE ARTHROSCOPY     tonsillectomy     TONSILLECTOMY     TUBAL LIGATION     Family History  Problem Relation Age of Onset   Diabetes Father    Heart disease Father    Breast cancer Sister 14   Diabetes Brother    Colon cancer Neg Hx    Ovarian cancer Neg Hx    Social History   Socioeconomic History   Marital status: Divorced    Spouse name: Not on file   Number of children: 1   Years of education: Not on file   Highest education level: Not on file  Occupational History   Not on file  Tobacco Use   Smoking status: Former    Packs/day: .5    Types: Cigarettes    Start date: 04/30/1970    Quit date: 04/30/1988    Years since quitting: 34.4    Passive exposure: Never   Smokeless tobacco: Never  Vaping Use   Vaping Use: Never used  Substance and Sexual Activity   Alcohol use: Not Currently   Drug use: Yes    Types: Hashish   Sexual activity: Yes    Partners: Male    Birth control/protection: Surgical  Other Topics Concern   Not on file  Social History Narrative   Not on file   Social Determinants of Health   Financial Resource Strain: Low Risk  (10/04/2022)   Overall  Financial Resource Strain (CARDIA)    Difficulty of Paying Living Expenses: Not hard at all  Food Insecurity: No Food Insecurity (10/04/2022)   Hunger Vital Sign    Worried About Running Out of Food in the Last Year: Never true    Ran Out of Food in the Last Year: Never true  Transportation Needs: No Transportation Needs (10/04/2022)   PRAPARE - Administrator, Civil Service (Medical): No  Lack of Transportation (Non-Medical): No  Physical Activity: Inactive (10/04/2022)   Exercise Vital Sign    Days of Exercise per Week: 0 days    Minutes of Exercise per Session: 0 min  Stress: Stress Concern Present (10/04/2022)   [employer] of Occupational Health - Occupational Stress Questionnaire    Feeling of Stress : To some extent  Social Connections: Moderately Isolated (10/04/2022)   Social Connection and Isolation Panel [NHANES]    Frequency of Communication with Friends and Family: More than three times a week    Frequency of Social Gatherings with Friends and Family: More than three times a week    Attends Religious Services: More than 4 times per year    Active Member of [employer] or Organizations: No    Attends Engineer, structural: Never    Marital Status: Divorced    Tobacco Counseling Counseling given: Not Answered   Clinical Intake:  Pre-visit preparation completed: No  Pain : No/denies pain Pain Score: 0-No pain   Nutrition Risk Assessment:  Has the patient had any N/V/D within the last 2 months?  No  Does the patient have any non-healing wounds?  No  Has the patient had any unintentional weight loss or weight gain?  No   Diabetes:  Is the patient diabetic?  Yes  If diabetic, was a CBG obtained today?  Yes CBG 158 Taken by patient Did the patient bring in their glucometer from home?  No  How often do you monitor your CBG's? 2 x daily.   Financial Strains and Diabetes Management:  Are you having any financial strains with the device, your supplies  or your medication? No .  Does the patient want to be seen by Chronic Care Management for management of their diabetes?  No  Would the patient like to be referred to a Nutritionist or for Diabetic Management?  No   Diabetic Exams:  Diabetic Eye Exam: Completed  . Overdue for diabetic eye exam. Pt has been advised about the importance in completing this exam. A referral has been placed today. Message sent to referral coordinator for scheduling purposes. Advised pt to expect a call from office referred to regarding appt.  Diabetic Foot Exam: Completed  . Pt has been advised about the importance in completing this exam. Pt is scheduled for diabetic foot exam on Followed by PCP.    BMI - recorded: 29.44 Nutritional Status: BMI 25 -29 Overweight Nutritional Risks: None Diabetes: Yes CBG done?: Yes (CBG 158 Taken by patient) CBG resulted in Enter/ Edit results?: Yes Did pt. bring in CBG monitor from home?: No  How often do you need to have someone help you when you read instructions, pamphlets, or other written materials from your doctor or pharmacy?: 1 - Never  Diabetic? Yes  Interpreter Needed?: No  Information entered by :: Theresa Mulligan LPN   Activities of Daily Living    10/04/2022    2:48 PM 02/12/2022    9:41 AM  In your present state of health, do you have any difficulty performing the following activities:  Hearing? 0 0  Vision? 0 0  Difficulty concentrating or making decisions? 0 0  Walking or climbing stairs? 0 0  Dressing or bathing? 0 0  Doing errands, shopping? 0 0  Preparing Food and eating ? N   Using the Toilet? N   In the past six months, have you accidently leaked urine? N   Do you have problems with loss of bowel control?  N   Managing your Medications? N   Managing your Finances? N   Housekeeping or managing your Housekeeping? N     Patient Care Team: [name], PA as PCP - General (Family Medicine) Defrancesco, Prentice Docker, MD as Consulting  Physician (Obstetrics and Gynecology) Columbia, Melodye Ped, CNM (Inactive) as Midwife (Obstetrics and Gynecology) Arminda Resides, MD as Consulting Physician (Dermatology) Sharrell Ku, MD as Consulting Physician (Gastroenterology) Jeani Hawking, MD as Consulting Physician (Gastroenterology) Marzella Schlein., MD (Ophthalmology) Purcell Nails, CNM (Inactive) as Midwife (Obstetrics and Gynecology) Graylon Gunning, PhD as Consulting Physician (Psychology)  Indicate any recent Medical Services you may have received from other than [facility] providers in the past year (date may be approximate).     Assessment:   This is a routine wellness examination for Arthea.  Hearing/Vision screen Hearing Screening - Comments:: Denies hearing difficulties   Vision Screening - Comments:: Wears rx glasses - up to date with routine eye exams with  Dr Lucretia Roers  Dietary issues and exercise activities discussed: Exercise limited by: None identified   Goals Addressed               This Visit's Progress     Lose weight (pt-stated)         Depression Screen    10/04/2022    2:47 PM 08/24/2022   11:05 AM 06/25/2022   11:28 AM 03/13/2022   10:52 AM 02/12/2022    9:40 AM 10/11/2021    9:25 AM 07/10/2021    1:42 PM  PHQ 2/9 Scores  PHQ - 2 Score 0 0 0  0 0 0  PHQ- 9 Score 0 2 0  0 3 3  Exception Documentation    Patient refusal       Fall Risk    10/04/2022    2:48 PM 08/24/2022   11:05 AM 02/12/2022    9:40 AM 10/11/2021    9:24 AM 10/09/2021   12:23 PM  Fall Risk   Falls in the past year? 0 0 0 0 0  Number falls in past yr: 0 0 0 0 0  Injury with Fall? 0 0 0 0 0  Risk for fall due to : No Fall Risks History of fall(s) No Fall Risks No Fall Risks   Follow up Falls prevention discussed Falls evaluation completed Falls evaluation completed Falls evaluation completed     FALL RISK PREVENTION PERTAINING TO THE HOME:  Any stairs in or around the home? Yes  If so, are there any without handrails? No   Home free of loose throw rugs in walkways, pet beds, electrical cords, etc? Yes  Adequate lighting in your home to reduce risk of falls? Yes   ASSISTIVE DEVICES UTILIZED TO PREVENT FALLS:  Life alert? No  Use of a cane, walker or w/c? No  Grab bars in the bathroom? No  Shower chair or bench in shower? No  Elevated toilet seat or a handicapped toilet? Yes  TIMED UP AND GO:  Was the test performed? No . Audio Visit  Cognitive Function:        10/04/2022    2:49 PM 10/11/2021    9:26 AM  6CIT Screen  What Year? 0 points 0 points  What month? 0 points 0 points  What time? 0 points 0 points  Count back from 20 0 points 0 points  Months in reverse 0 points 0 points  Repeat phrase 0 points 0 points  Total Score 0 points 0  points    Immunizations Immunization History  Administered Date(s) Administered   Fluad Quad(high Dose 65+) 02/19/2020, 02/23/2021, 03/13/2022   Influenza,inj,Quad PF,6+ Mos 02/01/2016, 02/15/2017, 03/05/2018, 02/24/2019   PFIZER(Purple Top)SARS-COV-2 Vaccination 08/07/2019, 08/31/2019, 04/15/2020   Pneumococcal Conjugate-13 12/31/2019   Pneumococcal Polysaccharide-23 01/09/2021   Tdap 10/11/2021   Zoster Recombinat (Shingrix) 10/11/2021, 12/22/2021   Zoster, Live 08/26/2015    TDAP status: Up to date  Flu Vaccine status: Up to date  Pneumococcal vaccine status: Up to date  Covid-19 vaccine status: Completed vaccines  Qualifies for Shingles Vaccine? Yes   Zostavax completed Yes   Shingrix Completed?: Yes  Screening Tests Health Maintenance  Topic Date Due   OPHTHALMOLOGY EXAM  Never done   Hepatitis C Screening  Never done   FOOT EXAM  01/09/2022   COVID-19 Vaccine (4 - 2023-24 season) 10/20/2022 (Originally 12/29/2021)   INFLUENZA VACCINE  11/29/2022   HEMOGLOBIN A1C  02/23/2023   Diabetic kidney evaluation - Urine ACR  06/26/2023   Diabetic kidney evaluation - eGFR measurement  08/24/2023   Medicare Annual Wellness (AWV)  10/04/2023    MAMMOGRAM  11/02/2023   Colonoscopy  02/06/2025   DTaP/Tdap/Td (2 - Td or Tdap) 10/12/2031   Pneumonia Vaccine 73+ Years old  Completed   DEXA SCAN  Completed   Zoster Vaccines- Shingrix  Completed   HPV VACCINES  Aged Out    Health Maintenance  Health Maintenance Due  Topic Date Due   OPHTHALMOLOGY EXAM  Never done   Hepatitis C Screening  Never done   FOOT EXAM  01/09/2022    Colorectal cancer screening: Type of screening: Colonoscopy. Completed 02/07/15. Repeat every 10 years  Mammogram status: Completed 11/01/21. Repeat every year  Bone Density status: Completed 04/10/22. Results reflect: Bone density results: OSTEOPOROSIS. Repeat every   years.  Lung Cancer Screening: (Low Dose CT Chest recommended if Age 38-80 years, 30 pack-year currently smoking OR have quit w/in 15years.) does not qualify.     Additional Screening:  Hepatitis C Screening: does qualify;  Deferred  Vision Screening: Recommended annual ophthalmology exams for early detection of glaucoma and other disorders of the eye. Is the patient up to date with their annual eye exam?  Yes  Who is the provider or what is the name of the office in which the patient attends annual eye exams? Dr Lucretia Roers If pt is not established with a provider, would they like to be referred to a provider to establish care? No .   Dental Screening: Recommended annual dental exams for proper oral hygiene  Community Resource Referral / Chronic Care Management:  CRR required this visit?  No   CCM required this visit?  No      Plan:     I have personally reviewed and noted the following in the patient's chart:   Medical and social history Use of alcohol, tobacco or illicit drugs  Current medications and supplements including opioid prescriptions. Patient is not currently taking opioid prescriptions. Functional ability and status Nutritional status Physical activity Advanced directives List of other physicians Hospitalizations,  surgeries, and ER visits in previous 12 months Vitals Screenings to include cognitive, depression, and falls Referrals and appointments  In addition, I have reviewed and discussed with patient certain preventive protocols, quality metrics, and best practice recommendations. A written personalized care plan for preventive services as well as general preventive health recommendations were provided to patient.     [name], LPN   05/05/1094   Nurse Notes:  Patient due Hep-C Screening

## 2022-10-18 ENCOUNTER — Other Ambulatory Visit: Payer: Self-pay | Admitting: Nurse Practitioner

## 2022-10-18 DIAGNOSIS — E1169 Type 2 diabetes mellitus with other specified complication: Secondary | ICD-10-CM

## 2022-10-24 ENCOUNTER — Other Ambulatory Visit: Payer: Self-pay | Admitting: Nurse Practitioner

## 2022-10-24 DIAGNOSIS — B009 Herpesviral infection, unspecified: Secondary | ICD-10-CM

## 2022-10-24 DIAGNOSIS — E782 Mixed hyperlipidemia: Secondary | ICD-10-CM

## 2022-10-24 DIAGNOSIS — E1159 Type 2 diabetes mellitus with other circulatory complications: Secondary | ICD-10-CM

## 2022-10-24 DIAGNOSIS — E1129 Type 2 diabetes mellitus with other diabetic kidney complication: Secondary | ICD-10-CM

## 2022-11-13 ENCOUNTER — Telehealth: Payer: Self-pay | Admitting: *Deleted

## 2022-11-13 DIAGNOSIS — Z794 Long term (current) use of insulin: Secondary | ICD-10-CM

## 2022-11-13 NOTE — Telephone Encounter (Signed)
Pt calling stating that she has been taking mounjaro and that she has been on the 7.5 dose, however pharmacy does NOT have that dose and she said she asked them about the next dose 10 and they do NOT have it either, the pharmacist told her that they have the 12 and patient is wanting to know if you would bump her up to that dose.  She said her sugars are the best they have been and she doesn't want to mess them up. Please advise.

## 2022-11-13 NOTE — Telephone Encounter (Signed)
Pt informed of below and wanted to know if she finds the 10 would you go up to that if she can't find the 7.5.  She is going to call the [facility] pharmacies to see what she can find out.

## 2022-11-13 NOTE — Telephone Encounter (Signed)
I would not advise jumping up to a dose that is that much higher.  I recommend calling around to see if there is another pharmacy that has the 7.5 mg dose.  The first one that I would recommend getting in touch with would be the [facility] pharmacy or [facility] as people generally have pretty good luck getting the injectable medications there.  Once she finds a pharmacy that has it in stock, she can then send me a MyChart message and let me know which one so I can send it into that pharmacy.

## 2022-11-14 ENCOUNTER — Other Ambulatory Visit (HOSPITAL_COMMUNITY): Payer: Self-pay

## 2022-11-14 MED ORDER — TIRZEPATIDE 7.5 MG/0.5ML ~~LOC~~ SOAJ
7.5000 mg | SUBCUTANEOUS | 2 refills | Status: DC
Start: 2022-11-14 — End: 2022-11-23
  Filled 2022-11-14: qty 2, 28d supply, fill #0

## 2022-11-14 NOTE — Addendum Note (Signed)
Addended by: Saralyn Pilar on: 11/14/2022 02:16 PM   Modules accepted: Orders

## 2022-11-14 NOTE — Telephone Encounter (Signed)
Tried to contact pt to inform them of below.

## 2022-11-14 NOTE — Telephone Encounter (Signed)
It would be safe to go up to 10 mg if that is all that she can find.

## 2022-11-14 NOTE — Telephone Encounter (Signed)
Pt calling to inform provider that the [facility] pharmacy on [address]. Has plenty of the 7.5 dose and 1 of the 10 dose.  Please send in the appropriate dose to this pharmacy.

## 2022-11-14 NOTE — Telephone Encounter (Signed)
Fantastic, I just sent over a refill of the 7.5 dose!  Meds ordered this encounter  Medications   tirzepatide (MOUNJARO) 7.5 MG/0.5ML Pen    Sig: Inject 7.5 mg into the skin once a week.    Dispense:  2 mL    Refill:  2    Titrating up, fill prescription for 5 mg dose first then this prescription for 7.5 mg dose 1 month later with first fill on 09/19/2022    Order Specific Question:   Supervising Provider    Answer:   Nani Gasser D [[id number]]

## 2022-11-14 NOTE — Telephone Encounter (Signed)
 Pt informed of below.  

## 2022-11-15 ENCOUNTER — Other Ambulatory Visit (HOSPITAL_COMMUNITY): Payer: Self-pay

## 2022-11-23 ENCOUNTER — Other Ambulatory Visit (HOSPITAL_COMMUNITY): Payer: Self-pay

## 2022-11-23 ENCOUNTER — Encounter: Payer: Self-pay | Admitting: Family Medicine

## 2022-11-23 ENCOUNTER — Ambulatory Visit (INDEPENDENT_AMBULATORY_CARE_PROVIDER_SITE_OTHER): Payer: Medicare Other | Admitting: Family Medicine

## 2022-11-23 VITALS — BP 119/73 | HR 90 | Ht 67.0 in | Wt 184.2 lb

## 2022-11-23 DIAGNOSIS — E1159 Type 2 diabetes mellitus with other circulatory complications: Secondary | ICD-10-CM | POA: Diagnosis not present

## 2022-11-23 DIAGNOSIS — Z1159 Encounter for screening for other viral diseases: Secondary | ICD-10-CM | POA: Diagnosis not present

## 2022-11-23 DIAGNOSIS — E119 Type 2 diabetes mellitus without complications: Secondary | ICD-10-CM | POA: Diagnosis not present

## 2022-11-23 DIAGNOSIS — E1169 Type 2 diabetes mellitus with other specified complication: Secondary | ICD-10-CM | POA: Diagnosis not present

## 2022-11-23 DIAGNOSIS — Z794 Long term (current) use of insulin: Secondary | ICD-10-CM | POA: Diagnosis not present

## 2022-11-23 DIAGNOSIS — E782 Mixed hyperlipidemia: Secondary | ICD-10-CM

## 2022-11-23 DIAGNOSIS — I152 Hypertension secondary to endocrine disorders: Secondary | ICD-10-CM | POA: Diagnosis not present

## 2022-11-23 LAB — POCT GLYCOSYLATED HEMOGLOBIN (HGB A1C): Hemoglobin A1C: 5.4 % (ref 4.0–5.6)

## 2022-11-23 MED ORDER — TIRZEPATIDE 10 MG/0.5ML ~~LOC~~ SOAJ
10.0000 mg | SUBCUTANEOUS | 0 refills | Status: DC
Start: 2022-12-04 — End: 2023-01-08
  Filled 2022-11-23 – 2022-12-11 (×2): qty 2, 28d supply, fill #0

## 2022-11-23 NOTE — Assessment & Plan Note (Signed)
Last lipid panel: LDL 61, HDL 36, triglycerides 212.  Triglycerides continue to improve.  Continue atorvastatin 10 mg daily, fenofibrate 48 mg daily.  Rechecking lipid panel today.  Will continue to monitor.

## 2022-11-23 NOTE — Assessment & Plan Note (Signed)
A1c today 5.4, significant improvement from 10.1 at last check.  With blood sugars often running low in the evening, recommend discontinuing bedtime dose of insulin.  Continue 13 units of insulin each morning.  Increase Mounjaro to 10 mg weekly injection and continue monitoring blood sugar at home.  If blood sugar in the morning runs low, can skip morning dose of insulin.  She also knows to send me a message if that is the case.  She will also check in in about 1 month to let me know if she is feeling well and would like to stay at the 10 mg dose or increase to 12 mg.  Will continue to monitor. Eye exam is due, we will conduct foot exam at next appointment.

## 2022-11-23 NOTE — Assessment & Plan Note (Signed)
BP goal <130/80. Stable, at goal.  Continue losartan and triamterene-hydrochlorothiazide 37.5-25 mg daily.  Will continue to monitor.

## 2022-11-23 NOTE — Patient Instructions (Signed)
START Mounjaro 10 mg weekly.  STOP the nighttime insulin (woohoo!)  Continue checking your blood sugar a couple of times each day. If your morning levels are less that 100, send me a message!  Also send me a message in about a month about how sugar levels have been so we can decide if we should stay at 10 mg or increase to the next dose up.

## 2022-11-23 NOTE — Progress Notes (Signed)
Established Patient Office Visit  Subjective   Patient ID: Mercedes Webb, female    DOB: Jul 14, 1955  Age: 67 y.o. MRN: [id number]  Chief Complaint  Patient presents with   Diabetes    HPI Mercedes Webb is a 67 y.o. female presenting today for follow up of hypertension, hyperlipidemia, diabetes. Hypertension: Patient here for follow-up of elevated blood pressure.  Pt denies chest pain, SOB, dizziness, edema, syncope, fatigue or heart palpitations. Taking losartan and triamterene-hydrochlorothiazide, reports excellent compliance with treatment. Denies side effects. Hyperlipidemia: tolerating atorvastatin and fenofibrate well with no myalgias or significant side effects.  The 10-year ASCVD risk score (Arnett DK, et al., 2019) is: 15.8% Diabetes: denies hypoglycemic events, wounds or sores that are not healing well, increased thirst or urination. Denies vision problems, eye exam due. Checking glucose at home, ranges have been consistently around 120 fasting, bedtime glucose has been as low as 101. Taking Mounjaro 7.5 mg dose as prescribed without any side effects.  With nighttime blood sugars getting so low, she has skipped bedtime insulin at times when it gets close to 100, but she has consistently been taking her 13 unit dose of insulin in the morning.   ROS Negative unless otherwise noted in HPI   Objective:     BP 119/73   Pulse 90   Ht 5\' 7"  (1.702 m)   Wt 184 lb 4 oz (83.6 kg)   SpO2 96%   BMI 28.86 kg/m   Physical Exam Constitutional:      General: She is not in acute distress.    Appearance: Normal appearance.  HENT:     Head: Normocephalic and atraumatic.  Cardiovascular:     Rate and Rhythm: Normal rate and regular rhythm.     Heart sounds: No murmur heard.    No friction rub. No gallop.  Pulmonary:     Effort: Pulmonary effort is normal. No respiratory distress.     Breath sounds: No wheezing, rhonchi or rales.  Skin:    General: Skin is warm and dry.   Neurological:     Mental Status: She is alert and oriented to person, place, and time.      Assessment & Plan:  Hypertension associated with diabetes (HCC) Assessment & Plan: BP goal <130/80. Stable, at goal.  Continue losartan and triamterene-hydrochlorothiazide 37.5-25 mg daily.  Will continue to monitor.    Orders: -     CBC with Differential/Platelet; Future -     Comprehensive metabolic panel; Future  Mixed diabetic hyperlipidemia associated with type 2 diabetes mellitus (HCC) Assessment & Plan: Last lipid panel: LDL 61, HDL 36, triglycerides 212.  Triglycerides continue to improve.  Continue atorvastatin 10 mg daily, fenofibrate 48 mg daily.  Rechecking lipid panel today.  Will continue to monitor.  Orders: -     Lipid panel; Future  Screening for viral disease -     Hepatitis C antibody; Future  Controlled type 2 diabetes mellitus without complication, with long-term current use of insulin (HCC) Assessment & Plan: A1c today 5.4, significant improvement from 10.1 at last check.  With blood sugars often running low in the evening, recommend discontinuing bedtime dose of insulin.  Continue 13 units of insulin each morning.  Increase Mounjaro to 10 mg weekly injection and continue monitoring blood sugar at home.  If blood sugar in the morning runs low, can skip morning dose of insulin.  She also knows to send me a message if that is the case.  She will  also check in in about 1 month to let me know if she is feeling well and would like to stay at the 10 mg dose or increase to 12 mg.  Will continue to monitor. Eye exam is due, we will conduct foot exam at next appointment.  Orders: -     Tirzepatide; Inject 10 mg into the skin once a week.  Dispense: 2 mL; Refill: 0 -     POCT glycosylated hemoglobin (Hb A1C)    Return in about 3 months (around 02/23/2023) for follow-up for HTN, HLD, DM with POC A1C.    [name], PA

## 2022-11-28 ENCOUNTER — Other Ambulatory Visit: Payer: Self-pay | Admitting: Family Medicine

## 2022-11-28 DIAGNOSIS — E1169 Type 2 diabetes mellitus with other specified complication: Secondary | ICD-10-CM

## 2022-12-05 ENCOUNTER — Other Ambulatory Visit (HOSPITAL_COMMUNITY): Payer: Self-pay

## 2022-12-11 ENCOUNTER — Other Ambulatory Visit (HOSPITAL_COMMUNITY): Payer: Self-pay

## 2022-12-19 DIAGNOSIS — H02834 Dermatochalasis of left upper eyelid: Secondary | ICD-10-CM | POA: Diagnosis not present

## 2022-12-19 DIAGNOSIS — H57813 Brow ptosis, bilateral: Secondary | ICD-10-CM | POA: Diagnosis not present

## 2022-12-19 DIAGNOSIS — Z01818 Encounter for other preprocedural examination: Secondary | ICD-10-CM | POA: Diagnosis not present

## 2022-12-19 DIAGNOSIS — H02831 Dermatochalasis of right upper eyelid: Secondary | ICD-10-CM | POA: Diagnosis not present

## 2022-12-19 DIAGNOSIS — H02411 Mechanical ptosis of right eyelid: Secondary | ICD-10-CM | POA: Diagnosis not present

## 2022-12-19 DIAGNOSIS — H02413 Mechanical ptosis of bilateral eyelids: Secondary | ICD-10-CM | POA: Diagnosis not present

## 2022-12-19 DIAGNOSIS — H0279 Other degenerative disorders of eyelid and periocular area: Secondary | ICD-10-CM | POA: Diagnosis not present

## 2022-12-19 DIAGNOSIS — H02412 Mechanical ptosis of left eyelid: Secondary | ICD-10-CM | POA: Diagnosis not present

## 2022-12-19 DIAGNOSIS — H53483 Generalized contraction of visual field, bilateral: Secondary | ICD-10-CM | POA: Diagnosis not present

## 2022-12-21 ENCOUNTER — Other Ambulatory Visit: Payer: Self-pay | Admitting: Family Medicine

## 2022-12-21 DIAGNOSIS — E669 Obesity, unspecified: Secondary | ICD-10-CM

## 2022-12-21 DIAGNOSIS — E781 Pure hyperglyceridemia: Secondary | ICD-10-CM

## 2022-12-21 DIAGNOSIS — Z1231 Encounter for screening mammogram for malignant neoplasm of breast: Secondary | ICD-10-CM

## 2022-12-21 DIAGNOSIS — I10 Essential (primary) hypertension: Secondary | ICD-10-CM

## 2022-12-26 ENCOUNTER — Ambulatory Visit
Admission: RE | Admit: 2022-12-26 | Discharge: 2022-12-26 | Disposition: A | Discharge status: Home / Self Care | Payer: Medicare Other | Source: Ambulatory Visit | Attending: Family Medicine | Admitting: Family Medicine

## 2022-12-26 DIAGNOSIS — Z1231 Encounter for screening mammogram for malignant neoplasm of breast: Secondary | ICD-10-CM | POA: Diagnosis not present

## 2022-12-28 DIAGNOSIS — H53483 Generalized contraction of visual field, bilateral: Secondary | ICD-10-CM | POA: Diagnosis not present

## 2023-01-03 ENCOUNTER — Telehealth: Payer: Self-pay | Admitting: *Deleted

## 2023-01-03 NOTE — Telephone Encounter (Signed)
Complete, in box to be faxed

## 2023-01-03 NOTE — Telephone Encounter (Signed)
Patient dropped off document Surgical Clearance, to be filled out by provider. Patient requested to send it back via Fax within ASAP at the number provided on the form. Document is located in providers tray at front office.Please advise at Mobile [phone] (mobile)

## 2023-01-04 NOTE — Telephone Encounter (Signed)
Forms have been faxed 

## 2023-01-08 ENCOUNTER — Telehealth: Payer: Self-pay | Admitting: *Deleted

## 2023-01-08 DIAGNOSIS — Z794 Long term (current) use of insulin: Secondary | ICD-10-CM

## 2023-01-08 MED ORDER — TIRZEPATIDE 10 MG/0.5ML ~~LOC~~ SOAJ
10.0000 mg | SUBCUTANEOUS | 0 refills | Status: DC
Start: 2023-01-08 — End: 2023-02-01

## 2023-01-08 NOTE — Telephone Encounter (Signed)
Meds ordered this encounter  Medications   tirzepatide (MOUNJARO) 10 MG/0.5ML Pen    Sig: Inject 10 mg into the skin once a week.    Dispense:  2 mL    Refill:  0    Order Specific Question:   Supervising Provider    Answer:   [name] [[id number]]

## 2023-01-08 NOTE — Telephone Encounter (Signed)
Pt calling to update provider with her medication.  She said that she thinks that she should stay on the 10 until she meets with you and then you can review her number that she is getting then to determine next dosage.  Please send refill of below.     tirzepatide (MOUNJARO) 10 MG/0.5ML Pen    [place] [facility]  [address] [place] [address]

## 2023-01-09 ENCOUNTER — Other Ambulatory Visit (HOSPITAL_COMMUNITY): Payer: Self-pay

## 2023-01-10 ENCOUNTER — Other Ambulatory Visit (HOSPITAL_COMMUNITY): Payer: Self-pay

## 2023-01-31 ENCOUNTER — Telehealth: Payer: Self-pay | Admitting: *Deleted

## 2023-01-31 DIAGNOSIS — Z794 Long term (current) use of insulin: Secondary | ICD-10-CM

## 2023-01-31 NOTE — Telephone Encounter (Signed)
Pt came into office and dropped off her numbers for the provider.  She stated that she is currently at 10 and thinks that the provider would probably want her to go to next dose of 12 of the mounjaro.  Please send in the new dose.  Numbers have bee set to scan into patients chart.

## 2023-02-01 MED ORDER — TIRZEPATIDE 10 MG/0.5ML ~~LOC~~ SOAJ
10.0000 mg | SUBCUTANEOUS | 0 refills | Status: DC
Start: 2023-02-01 — End: 2023-03-01

## 2023-02-01 NOTE — Telephone Encounter (Signed)
I am going to keep her at her current dosage right now.  Our goal is for fasting blood sugar to be between 80 and 140, and the vast majority of her blood sugars meet that goal.  Her A1c last time was also amazing, so we will hold off on any adjustments until the next time that I see her in person.  30 total fasting blood sugar values provided in log. 22 values (73%) 80-140 (at goal) 8 values (27%) >140 (above goal)

## 2023-02-01 NOTE — Telephone Encounter (Signed)
Meds ordered this encounter  Medications   tirzepatide (MOUNJARO) 10 MG/0.5ML Pen    Sig: Inject 10 mg into the skin once a week.    Dispense:  2 mL    Refill:  0    Order Specific Question:   Supervising Provider    Answer:   Nani Gasser D [[id number]]

## 2023-02-01 NOTE — Telephone Encounter (Signed)
Informed pt of below and she said that she needs a refill due to not having enough to get to the appointment.

## 2023-02-01 NOTE — Addendum Note (Signed)
Addended by: Saralyn Pilar on: 02/01/2023 10:47 AM   Modules accepted: Orders

## 2023-02-11 DIAGNOSIS — Z85828 Personal history of other malignant neoplasm of skin: Secondary | ICD-10-CM | POA: Diagnosis not present

## 2023-02-11 DIAGNOSIS — L821 Other seborrheic keratosis: Secondary | ICD-10-CM | POA: Diagnosis not present

## 2023-02-11 DIAGNOSIS — L565 Disseminated superficial actinic porokeratosis (DSAP): Secondary | ICD-10-CM | POA: Diagnosis not present

## 2023-02-19 DIAGNOSIS — H57813 Brow ptosis, bilateral: Secondary | ICD-10-CM | POA: Diagnosis not present

## 2023-02-19 DIAGNOSIS — H02413 Mechanical ptosis of bilateral eyelids: Secondary | ICD-10-CM | POA: Diagnosis not present

## 2023-02-19 DIAGNOSIS — H02831 Dermatochalasis of right upper eyelid: Secondary | ICD-10-CM | POA: Diagnosis not present

## 2023-02-19 DIAGNOSIS — H53483 Generalized contraction of visual field, bilateral: Secondary | ICD-10-CM | POA: Diagnosis not present

## 2023-02-19 DIAGNOSIS — H02834 Dermatochalasis of left upper eyelid: Secondary | ICD-10-CM | POA: Diagnosis not present

## 2023-02-26 ENCOUNTER — Other Ambulatory Visit: Payer: Self-pay | Admitting: Family Medicine

## 2023-02-26 DIAGNOSIS — E1169 Type 2 diabetes mellitus with other specified complication: Secondary | ICD-10-CM

## 2023-02-28 ENCOUNTER — Telehealth: Payer: Self-pay

## 2023-02-28 ENCOUNTER — Ambulatory Visit: Payer: Medicare Other | Admitting: Family Medicine

## 2023-02-28 DIAGNOSIS — Z794 Long term (current) use of insulin: Secondary | ICD-10-CM

## 2023-02-28 NOTE — Telephone Encounter (Signed)
Pt appt is reschedule but will need refill for  tirzepatide [facility]) 10 MG/0.5ML Pen   But also wants to know if she is due to increase to 12mg .  Pharmacy: [facility] - [place], [place] - [address]

## 2023-03-01 MED ORDER — TIRZEPATIDE 10 MG/0.5ML ~~LOC~~ SOAJ
10.0000 mg | SUBCUTANEOUS | 0 refills | Status: DC
Start: 2023-03-01 — End: 2023-03-19

## 2023-03-01 NOTE — Addendum Note (Signed)
Addended by: Saralyn Pilar on: 03/01/2023 12:08 PM   Modules accepted: Orders

## 2023-03-01 NOTE — Telephone Encounter (Signed)
Refill sent, no need to increase at this time

## 2023-03-05 ENCOUNTER — Ambulatory Visit (INDEPENDENT_AMBULATORY_CARE_PROVIDER_SITE_OTHER): Payer: Medicare Other

## 2023-03-05 ENCOUNTER — Ambulatory Visit: Payer: Medicare Other

## 2023-03-05 DIAGNOSIS — Z23 Encounter for immunization: Secondary | ICD-10-CM | POA: Diagnosis not present

## 2023-03-13 DIAGNOSIS — H04123 Dry eye syndrome of bilateral lacrimal glands: Secondary | ICD-10-CM | POA: Diagnosis not present

## 2023-03-13 DIAGNOSIS — H5203 Hypermetropia, bilateral: Secondary | ICD-10-CM | POA: Diagnosis not present

## 2023-03-13 DIAGNOSIS — E119 Type 2 diabetes mellitus without complications: Secondary | ICD-10-CM | POA: Diagnosis not present

## 2023-03-13 DIAGNOSIS — H524 Presbyopia: Secondary | ICD-10-CM | POA: Diagnosis not present

## 2023-03-18 ENCOUNTER — Ambulatory Visit: Payer: Medicare Other | Admitting: Family Medicine

## 2023-03-19 ENCOUNTER — Encounter: Payer: Self-pay | Admitting: Family Medicine

## 2023-03-19 ENCOUNTER — Ambulatory Visit (INDEPENDENT_AMBULATORY_CARE_PROVIDER_SITE_OTHER): Payer: Medicare Other | Admitting: Family Medicine

## 2023-03-19 VITALS — BP 120/75 | HR 83 | Resp 18 | Ht 67.0 in | Wt 178.0 lb

## 2023-03-19 DIAGNOSIS — Z794 Long term (current) use of insulin: Secondary | ICD-10-CM

## 2023-03-19 DIAGNOSIS — E66811 Obesity, class 1: Secondary | ICD-10-CM

## 2023-03-19 DIAGNOSIS — E1169 Type 2 diabetes mellitus with other specified complication: Secondary | ICD-10-CM | POA: Diagnosis not present

## 2023-03-19 DIAGNOSIS — I152 Hypertension secondary to endocrine disorders: Secondary | ICD-10-CM

## 2023-03-19 DIAGNOSIS — E119 Type 2 diabetes mellitus without complications: Secondary | ICD-10-CM

## 2023-03-19 DIAGNOSIS — E1159 Type 2 diabetes mellitus with other circulatory complications: Secondary | ICD-10-CM

## 2023-03-19 DIAGNOSIS — E782 Mixed hyperlipidemia: Secondary | ICD-10-CM | POA: Diagnosis not present

## 2023-03-19 LAB — POCT GLYCOSYLATED HEMOGLOBIN (HGB A1C): HbA1c POC (<> result, manual entry): 5.7 % (ref 4.0–5.6)

## 2023-03-19 MED ORDER — FENOFIBRATE 48 MG PO TABS: 48.0000 mg | ORAL_TABLET | Freq: Every day | ORAL | 0 refills | Status: DC

## 2023-03-19 MED ORDER — TIRZEPATIDE 10 MG/0.5ML ~~LOC~~ SOAJ: 10.0000 mg | SUBCUTANEOUS | 0 refills | Status: DC

## 2023-03-19 MED ORDER — LOSARTAN POTASSIUM 25 MG PO TABS: ORAL_TABLET | ORAL | 1 refills | Status: DC

## 2023-03-19 MED ORDER — ATORVASTATIN CALCIUM 10 MG PO TABS: ORAL_TABLET | ORAL | 1 refills | Status: DC

## 2023-03-19 MED ORDER — HUMULIN 70/30 KWIKPEN (70-30) 100 UNIT/ML ~~LOC~~ SUPN: PEN_INJECTOR | SUBCUTANEOUS | 0 refills | Status: DC

## 2023-03-19 MED ORDER — ASPIRIN 81 MG PO TBEC: 81.0000 mg | DELAYED_RELEASE_TABLET | Freq: Every day | ORAL | 0 refills | Status: DC

## 2023-03-19 MED ORDER — TRIAMTERENE-HCTZ 37.5-25 MG PO TABS: 1.0000 | ORAL_TABLET | Freq: Every day | ORAL | 0 refills | Status: DC

## 2023-03-19 NOTE — Assessment & Plan Note (Signed)
BP goal <130/80. Stable, at goal.  Continue losartan and triamterene-hydrochlorothiazide 37.5-25 mg daily.  Will continue to monitor.

## 2023-03-19 NOTE — Assessment & Plan Note (Signed)
A1c 5.7, very well-controlled.  Continue Mounjaro 10 mg weekly and 13 units insulin each morning.  Eye exam completed, foot exam completed today.  Will continue to monitor.

## 2023-03-19 NOTE — Progress Notes (Signed)
Established Patient Office Visit  Subjective   Patient ID: Mercedes Webb, female    DOB: 1955-05-10  Age: 67 y.o. MRN: [id number]  Chief Complaint  Patient presents with   Diabetes   Hypertension    HPI Mercedes Webb is a 67 y.o. female presenting today for follow up of hypertension, diabetes. Hypertension:  Pt denies chest pain, SOB, dizziness, edema, syncope, fatigue or heart palpitations. Taking losartan, reports excellent compliance with treatment. Denies side effects. Diabetes: denies hypoglycemic events, wounds or sores that are not healing well, increased thirst or urination. Denies vision problems, eye exam completed.  Taking insulin and Mounjaro as prescribed without any side effects.  She would like to continue with her current regimen and have a cortisone injection in her hip prior to make any further adjustments in diabetes medication routine.   Outpatient Medications Prior to Visit  Medication Sig   Accu-Chek Softclix Lancets lancets USE 1  TO CHECK GLUCOSE 4 TIMES DAILY   Ascorbic Acid (VITAMIN C) 1000 MG tablet Take 1,000 mg by mouth daily.   blood glucose meter kit and supplies Dispense based on patient and insurance preference. Use to check fasting blood sugar once daily.   Calcium Carbonate (CALTRATE 600 PO) Take 600 mg by mouth daily.   glucose blood (ACCU-CHEK GUIDE) test strip USE 1 STRIP 4 TIMES DAILY TO  CHECK  BLOOD  SUGAR   Insulin Pen Needle (B-D UF III MINI PEN NEEDLES) 31G X 5 MM MISC USE  THREE TIMES DAILY   Multiple Vitamin (MULTIVITAMIN WITH MINERALS) TABS tablet Take 1 tablet by mouth daily. Centrum Silver   omega-3 acid ethyl esters (LOVAZA) 1 g capsule Take 2 capsules (2 g total) by mouth 2 (two) times daily.   valACYclovir (VALTREX) 500 MG tablet TAKE 1 TABLET BY MOUTH DAILY.   venlafaxine XR (EFFEXOR-XR) 150 MG 24 hr capsule Take 150 mg by mouth every morning.   zolpidem (AMBIEN CR) 12.5 MG CR tablet Take 12.5 mg by mouth at bedtime.    [DISCONTINUED] ASPIRIN LOW DOSE 81 MG tablet TAKE 1 TABLET (81 MG TOTAL) BY MOUTH DAILY.   [DISCONTINUED] atorvastatin (LIPITOR) 10 MG tablet TAKE 1 TABLET BY MOUTH NIGHTLY.   [DISCONTINUED] fenofibrate (TRICOR) 48 MG tablet TAKE 1 TABLET BY MOUTH DAILY.   [DISCONTINUED] HUMULIN 70/30 KWIKPEN (70-30) 100 UNIT/ML KwikPen INJECT 13 UNITS INTO THE SKIN IN THE MORNING AND 20 UNITS INTO THE SKIN AT BEDTIME.   [DISCONTINUED] losartan (COZAAR) 25 MG tablet TAKE 1 TABLET BY MOUTH DAILY   [DISCONTINUED] tirzepatide (MOUNJARO) 10 MG/0.5ML Pen Inject 10 mg into the skin once a week.   [DISCONTINUED] triamterene-hydrochlorothiazide (MAXZIDE-25) 37.5-25 MG tablet TAKE 1 TABLET BY MOUTH DAILY.   No facility-administered medications prior to visit.    ROS Negative unless otherwise noted in HPI   Objective:     BP 120/75 (BP Location: Left Arm, Patient Position: Sitting, Cuff Size: Normal)   Pulse 83   Resp 18   Ht 5\' 7"  (1.702 m)   Wt 178 lb (80.7 kg)   SpO2 97%   BMI 27.88 kg/m   Physical Exam Constitutional:      General: She is not in acute distress.    Appearance: Normal appearance.  HENT:     Head: Normocephalic and atraumatic.  Cardiovascular:     Rate and Rhythm: Normal rate and regular rhythm.     Heart sounds: No murmur heard.    No friction rub. No gallop.  Pulmonary:  Effort: Pulmonary effort is normal. No respiratory distress.     Breath sounds: No wheezing, rhonchi or rales.  Skin:    General: Skin is warm and dry.  Neurological:     Mental Status: She is alert and oriented to person, place, and time.    Diabetic Foot Exam - Simple   Simple Foot Form Diabetic Foot exam was performed with the following findings: Yes 03/19/2023  4:36 PM  Visual Inspection No deformities, no ulcerations, no other skin breakdown bilaterally: Yes Sensation Testing Intact to touch and monofilament testing bilaterally: Yes Pulse Check Posterior Tibialis and Dorsalis pulse intact  bilaterally: Yes Comments    Results for orders placed or performed in visit on 03/19/23  POCT HgB A1C  Result Value Ref Range   Hemoglobin A1C     HbA1c POC (<> result, manual entry) 5.7 4.0 - 5.6 %   HbA1c, POC (prediabetic range)     HbA1c, POC (controlled diabetic range)       Assessment & Plan:  Controlled type 2 diabetes mellitus without complication, with long-term current use of insulin (HCC) Assessment & Plan: A1c 5.7, very well-controlled.  Continue Mounjaro 10 mg weekly and 13 units insulin each morning.  Eye exam completed, foot exam completed today.  Will continue to monitor.  Orders: -     POCT glycosylated hemoglobin (Hb A1C) -     Tirzepatide; Inject 10 mg into the skin once a week.  Dispense: 2 mL; Refill: 0 -     Losartan Potassium; TAKE 1 TABLET BY MOUTH DAILY  Dispense: 90 tablet; Refill: 1 -     HumuLIN 70/30 KwikPen; Inject 13 units into the skin in the morning and 20 units into the skin at bedtime.  Dispense: 15 mL; Refill: 0  Hypertension associated with diabetes (HCC) Assessment & Plan: BP goal <130/80. Stable, at goal.  Continue losartan and triamterene-hydrochlorothiazide 37.5-25 mg daily.  Will continue to monitor.    Orders: -     Triamterene-HCTZ; Take 1 tablet by mouth daily.  Dispense: 90 tablet; Refill: 0 -     Aspirin; Take 1 tablet (81 mg total) by mouth daily.  Dispense: 90 tablet; Refill: 0 -     Losartan Potassium; TAKE 1 TABLET BY MOUTH DAILY  Dispense: 90 tablet; Refill: 1  Obesity, Class I, BMI 30-34.9 -     Aspirin; Take 1 tablet (81 mg total) by mouth daily.  Dispense: 90 tablet; Refill: 0  Mixed diabetic hyperlipidemia associated with type 2 diabetes mellitus (HCC) -     Atorvastatin Calcium; TAKE 1 TABLET BY MOUTH NIGHTLY.  Dispense: 90 tablet; Refill: 1 -     Fenofibrate; Take 1 tablet (48 mg total) by mouth daily.  Dispense: 90 tablet; Refill: 0    Return in about 4 months (around 07/17/2023) for follow-up for HTN, HLD, DM,  fasting blood work 1 week before.    Mercedes Quitter, PA

## 2023-03-25 ENCOUNTER — Ambulatory Visit: Payer: Medicare Other | Admitting: Sports Medicine

## 2023-03-25 ENCOUNTER — Other Ambulatory Visit: Payer: Self-pay

## 2023-03-25 ENCOUNTER — Other Ambulatory Visit (INDEPENDENT_AMBULATORY_CARE_PROVIDER_SITE_OTHER): Payer: Medicare Other

## 2023-03-25 ENCOUNTER — Encounter: Payer: Self-pay | Admitting: Sports Medicine

## 2023-03-25 DIAGNOSIS — E1169 Type 2 diabetes mellitus with other specified complication: Secondary | ICD-10-CM

## 2023-03-25 DIAGNOSIS — M25512 Pain in left shoulder: Secondary | ICD-10-CM | POA: Diagnosis not present

## 2023-03-25 DIAGNOSIS — Z794 Long term (current) use of insulin: Secondary | ICD-10-CM | POA: Diagnosis not present

## 2023-03-25 DIAGNOSIS — G8929 Other chronic pain: Secondary | ICD-10-CM | POA: Diagnosis not present

## 2023-03-25 DIAGNOSIS — M25552 Pain in left hip: Secondary | ICD-10-CM

## 2023-03-25 DIAGNOSIS — M1612 Unilateral primary osteoarthritis, left hip: Secondary | ICD-10-CM | POA: Diagnosis not present

## 2023-03-25 MED ORDER — LIDOCAINE HCL 1 % IJ SOLN
4.0000 mL | INTRAMUSCULAR | Status: AC | PRN
  Administered 2023-03-25: 4 mL

## 2023-03-25 MED ORDER — METHYLPREDNISOLONE ACETATE 40 MG/ML IJ SUSP
40.0000 mg | INTRAMUSCULAR | Status: AC | PRN
  Administered 2023-03-25: 40 mg via INTRA_ARTICULAR

## 2023-03-25 NOTE — Progress Notes (Signed)
Mercedes Webb - 67 y.o. female MRN [id number]  Date of birth: [birth year]  Office Visit Note: Visit Date: 03/25/2023 PCP: [name], PA Referred by: [name], PA  Subjective: Chief Complaint  Patient presents with   Left Hip - Pain   Left Shoulder - Pain   HPI: Mercedes Webb is a pleasant 67 y.o. female who presents today for acute on chronic left hip pain, evaluation of left shoulder pain.  Left hip - has known osteoarthritis of the left hip.  We did perform an ultrasound-guided hip injection on 01/05/2022 which gave her excellent relief of her pain at that time.  She is interested in repeat injection today.  Left shoulder -the left shoulder over the anterior and lateral aspect has bothered her over the last few months.  It is worse with certain reaching motions out and specifically back behind her back.  She denies any specific injury.  She does take ibuprofen as needed, but has limited this secondary to her primary care instruction.  DM II - much better controlled over the last year. She is managed on Humulin 70/30 13units daily.  Lab Results  Component Value Date   HGBA1C 5.7 03/19/2023   Pertinent ROS were reviewed with the patient and found to be negative unless otherwise specified above in HPI.   Assessment & Plan: Visit Diagnoses:  1. Chronic left hip pain   2. Unilateral primary osteoarthritis, left hip   3. Chronic left shoulder pain   4. Type 2 diabetes mellitus with other specified complication, with long-term current use of insulin (HCC)    Plan: Impression is acute on chronic left hip pain with moderate osteoarthritis exacerbation.  In the past we did perform ultrasound-guided Toradol injection into the hip which gave her great relief for the short-term, however her glucose levels were much higher at that time so she could not undergo corticosteroid injection.  Through shared decision making today, we did proceed with ultrasound-guided  intra-articular hip injection, patient tolerated well.  Advised on the transient increase in her sugars, she will check her sugars and adjust her Humulin as necessary. Continue Humulin 70/30 (13units) daily otherwise.  In terms of her left shoulder, this is indicative of rotator cuff arthropathy.  Her x-rays only show mild AC joint arthritis but the joint itself is relatively well-preserved.  She has good strength however only thing is secondary to pain.  She will follow-up with me in about 2 weeks and we will review her imaging, I do not think she needs an MRI at this standpoint.  Likely would benefit from either formalized physical therapy versus a home exercise plan.  Could consider subacromial joint injection down the road, but would not do this for at least 2 weeks to minimize her corticosteroid birding given her diabetes. F/u in 2 weeks.  Additional treatment considerations: Short-term use of meloxicam or Celebrex for 2-3 weeks  Follow-up: Return in about 2 weeks (around 04/08/2023) for for L-shoulder evaluation and poss inj (30-min).   Meds & Orders: No orders of the defined types were placed in this encounter.   Orders Placed This Encounter  Procedures   Large Joint Inj: L hip joint   US Guided Needle Placement - No Linked Charges   XR Shoulder Left     Procedures: Large Joint Inj: L hip joint on 03/25/2023 3:42 PM Indications: pain Details: 22 G 3.5 in needle, ultrasound-guided anterior approach Medications: 4 mL lidocaine 1 %; 40 mg methylPREDNISolone acetate  40 MG/ML Outcome: tolerated well, no immediate complications  Procedure: US-guided intra-articular hip injection, Left After discussion on risks/benefits/indications and informed verbal consent was obtained, a timeout was performed. Patient was lying supine on exam table. The hip was cleaned with betadine and alcohol swabs. Then utilizing ultrasound guidance, the patient's femoral head and neck junction was identified and  subsequently injected with 4:1 lidocaine:depomedrol via an in-plane approach with ultrasound visualization of the injectate administered into the hip joint. Patient tolerated procedure well without immediate complications.  Procedure, treatment alternatives, risks and benefits explained, specific risks discussed. Consent was given by the patient. Immediately prior to procedure a time out was called to verify the correct patient, procedure, equipment, support staff and site/side marked as required. Patient was prepped and draped in the usual sterile fashion.          Clinical History: No specialty comments available.  She reports that she quit smoking about 34 years ago. Her smoking use included cigarettes. She started smoking about 52 years ago. She has a 9 pack-year smoking history. She has never been exposed to tobacco smoke. She has never used smokeless tobacco.  Recent Labs    08/24/22 1109 11/23/22 1025 03/19/23 1526  HGBA1C 10.1* 5.4 5.7    Objective:    Physical Exam  Gen: Well-appearing, in no acute distress; non-toxic CV: Well-perfused. Warm.  Resp: Breathing unlabored on room air; no wheezing. Psych: Fluid speech in conversation; appropriate affect; normal thought process Neuro: Sensation intact throughout. No gross coordination deficits.   Ortho Exam - Left shoulder: Positive TTP at Codman's point.  She is able Xu perform forward flexion to approximately 140 degrees before some pain and pulling, abduction to 135 degrees.  I am able to take her to near full range motion passively in all directions.  There is positive drop arm and empty can testing.  Positive Gerber liftoff test.  There is no significant weakness with rotator cuff testing other than secondary to pain with Gerber liftoff.  - Left hip: No bony tenderness to palpation, no effusion redness or swelling.  There is pain with FADIR testing, positive Stinchfield test.  Nonantalgic gait.  Imaging: XR Shoulder  Left  Result Date: 03/25/2023 3 views of the left shoulder including AP, Grashey, and axial view were ordered and reviewed by myself.  X-rays show a well-seated humeral head.  There is mild to moderate AC joint arthritis.  No bony fracture or otherwise acute bony abnormality.   Narrative & Impression  CLINICAL DATA:  Left hip pain for 1 year.   EXAM: MR OF THE LEFT HIP WITHOUT CONTRAST   TECHNIQUE: Multiplanar, multisequence MR imaging was performed. No intravenous contrast was administered.   COMPARISON:  Radiographs 11/07/2021   FINDINGS: Mild/moderate bilateral hip joint degenerative changes with degenerative chondrosis and early joint space narrowing. Minimal subchondral cystic changes. No hip joint effusion. No periarticular fluid collections to suggest a paralabral cyst. There is a anterior superior labral tear involving the left hip.   No hip stress fracture or AVN.   The pubic symphysis and SI joints are intact. No pelvic fractures or bone lesions.   Minimal bilateral peritendinitis but no trochanteric bursitis. The hip and pelvic musculature are unremarkable. No muscle tear, myositis or mass. The hamstring tendons are intact. Mild proximal hamstring tendinopathy bilaterally.   No significant intrapelvic abnormalities. No inguinal mass or hernia.   IMPRESSION: 1. Mild/moderate bilateral hip joint degenerative changes but no stress fracture or AVN. 2. Anterior superior labral tear  involving the left hip. 3. Minimal bilateral peritendinitis but no trochanteric bursitis. 4. No significant intrapelvic abnormalities.     Electronically Signed   By: Rudie Meyer M.D.   On: 01/04/2022 07:56   **Independently reviewed both left hip MRI from 01/02/22 and left hip x-ray from 11/07/2021.  L-hip XR 11/07/21: Radiographs of her left hip were reviewed today in multiple projections.   She has well-maintained alignment and femoral head is well placed in the  acetabulum she  does have some sclerotic changes and small osteophyte fight  formation at the superior and inferior pole of the acetabulum.  No  evidence of any fracture   Past Medical/Family/Surgical/Social History: Medications & Allergies reviewed per EMR, new medications updated. Patient Active Problem List   Diagnosis Date Noted   Pain in left hip 01/04/2022   Trochanteric bursitis, left hip 11/07/2021   Microalbuminuria due to type 2 diabetes mellitus (HCC) 06/17/2019   Controlled type 2 diabetes mellitus without complication, with long-term current use of insulin (HCC) 10/23/2018   Secondary hyperhidrosis- not focal in nature but primarily from waist up is the worst 08/20/2018   Low serum progesterone 05/30/2018   Excessive sweating- likely due to hormonal 11/11/2017   Elevated serum creatinine 09/04/2017   Elevated ALT measurement 09/04/2017   Postmenopausal- 30 yrs ago TAH "yrs ago" 11/12/2016   Hormone imbalance- txed by gyn 11/12/2016   Primary insomnia 11/12/2016   Mood disorder (HCC)- mixed anxiety and depression 11/12/2016   Vitamin D deficiency 09/28/2016   Mixed diabetic hyperlipidemia associated with type 2 diabetes mellitus (HCC) 09/28/2016   Hypokalemia 02/01/2016   Hypertension associated with diabetes (HCC) 01/31/2016   Generalized anxiety disorder 01/31/2016   Surgical menopause 12/23/2014   Status post abdominal hysterectomy 12/23/2014   History of cervical dysplasia 12/23/2014   SUI (stress urinary incontinence, female) 12/23/2014   Obesity, Class I, BMI 30-34.9 12/23/2014   Past Medical History:  Diagnosis Date   Anxiety    Cancer (HCC) 2016   Skin CA squamous cell on bilateral feet removed   Cervical dysplasia    h/o   Colon polyps    polyps on first scope ~ 2006, no recurrent polyps ~ 2001 and in 2016.    Diabetes mellitus without complication (HCC)    Endometriosis    Family history of adverse reaction to anesthesia    Mother had difficulty waking & nausea    Headache    Hypertension    Insomnia    Pancreatitis 01/2016   Recurrent cold sores    Family History  Problem Relation Age of Onset   Diabetes Father    Heart disease Father    Breast cancer Sister 18   Diabetes Brother    Colon cancer Neg Hx    Ovarian cancer Neg Hx    Past Surgical History:  Procedure Laterality Date   ABDOMINAL HYSTERECTOMY     tah/bso- endometriosis   CHOLECYSTECTOMY     ERCP N/A 02/03/2016   Procedure: ENDOSCOPIC RETROGRADE CHOLANGIOPANCREATOGRAPHY (ERCP);  Surgeon: Jeani Hawking, MD;  Location: [facility] END[facility]COPY;  Service: Endoscopy;  Laterality: N/A;   KNEE ARTHROSCOPY     tonsillectomy     TONSILLECTOMY     TUBAL LIGATION     Social History   Occupational History   Not on file  Tobacco Use   Smoking status: Former    Current packs/day: 0.00    Average packs/day: 0.5 packs/day for 18.0 years (9.0 ttl pk-yrs)    Types: Cigarettes  Start date: 04/30/1970    Quit date: 04/30/1988    Years since quitting: 34.9    Passive exposure: Never   Smokeless tobacco: Never  Vaping Use   Vaping status: Never Used  Substance and Sexual Activity   Alcohol use: Not Currently   Drug use: Yes    Types: Hashish   Sexual activity: Yes    Partners: Male    Birth control/protection: Surgical

## 2023-04-08 ENCOUNTER — Ambulatory Visit: Payer: Medicare Other | Admitting: Sports Medicine

## 2023-04-08 ENCOUNTER — Encounter: Payer: Self-pay | Admitting: Sports Medicine

## 2023-04-08 DIAGNOSIS — M25512 Pain in left shoulder: Secondary | ICD-10-CM | POA: Diagnosis not present

## 2023-04-08 DIAGNOSIS — E1169 Type 2 diabetes mellitus with other specified complication: Secondary | ICD-10-CM | POA: Diagnosis not present

## 2023-04-08 DIAGNOSIS — M1612 Unilateral primary osteoarthritis, left hip: Secondary | ICD-10-CM | POA: Diagnosis not present

## 2023-04-08 DIAGNOSIS — G8929 Other chronic pain: Secondary | ICD-10-CM | POA: Diagnosis not present

## 2023-04-08 DIAGNOSIS — Z794 Long term (current) use of insulin: Secondary | ICD-10-CM

## 2023-04-08 MED ORDER — CELECOXIB 100 MG PO CAPS: 100.0000 mg | ORAL_CAPSULE | Freq: Two times a day (BID) | ORAL | 1 refills | Status: DC

## 2023-04-08 MED ORDER — CELECOXIB 100 MG PO CAPS: 100.0000 mg | ORAL_CAPSULE | Freq: Every day | ORAL | 0 refills | Status: DC

## 2023-04-08 MED ORDER — LIDOCAINE HCL 1 % IJ SOLN
2.0000 mL | INTRAMUSCULAR | Status: AC | PRN
  Administered 2023-04-08: 2 mL

## 2023-04-08 MED ORDER — METHYLPREDNISOLONE ACETATE 40 MG/ML IJ SUSP
40.0000 mg | INTRAMUSCULAR | Status: AC | PRN
  Administered 2023-04-08: 40 mg via INTRA_ARTICULAR

## 2023-04-08 MED ORDER — BUPIVACAINE HCL 0.25 % IJ SOLN
2.0000 mL | INTRAMUSCULAR | Status: AC | PRN
  Administered 2023-04-08: 2 mL via INTRA_ARTICULAR

## 2023-04-08 NOTE — Progress Notes (Signed)
Patient says that her hip is feeling better since her injection. She says it is not 100% and still has a deep ache, but she has been able to sleep on the left side some as far as her hip pain.   Patient says that her left shoulder is still painful. She says that the left shoulder prevents her from sleeping normally on the left side.  Patient was instructed in 10 minutes of therapeutic exercises for left shoulder to improve strength, ROM and function according to my instructions and plan of care by a Certified Athletic Trainer during the office visit. A customized handout was provided and demonstration of proper technique shown and discussed. Patient did perform exercises and demonstrate understanding through teachback.  All questions discussed and answered.

## 2023-04-08 NOTE — Progress Notes (Signed)
Mercedes Webb - 67 y.o. female MRN [id number]  Date of birth: Sep 21, 1955  Office Visit Note: Visit Date: 04/08/2023 PCP: [name], PA Referred by: [name], PA  Subjective: Chief Complaint  Patient presents with   Left Hip - Follow-up   Left Shoulder - Pain   HPI: Mercedes Webb is a pleasant 67 y.o. female who presents today for persistent left shoulder pain, f/u of L-hip pain.  Left hip -this is doing much improved after our ultrasound-guided intra-articular hip injection.  Still not 100%, but has less pain with activity and lying on that side to sleep.  Left shoulder -this continues to give her pain, pain with certain reaching motions, this is affecting her ADLs.  She is not taking medication on a consistent basis and tries to limit her ibuprofen.  DM II - much better controlled over the last year. She is managed on Humulin 70/30 13units daily. Her sugars stayed well controlled even after last injection for the hip. AM bG were around 140 today.  Pertinent ROS were reviewed with the patient and found to be negative unless otherwise specified above in HPI.   Assessment & Plan: Visit Diagnoses:  1. Chronic left shoulder pain   2. Unilateral primary osteoarthritis, left hip   3. Type 2 diabetes mellitus with other specified complication, with long-term current use of insulin (HCC)    Plan: [name] received excellent relief from her hip pain with osteoarthritis from her intra-articular injection a few weeks ago.  She will continue being active, walking and keep mobility about the hip.  She is dealing with left shoulder pain which seems indicative of rotator cuff tendinitis versus subacromial joint bursitis.  Through shared decision-making, we did proceed with subacromial joint injection. Advised on the transient increase in her sugars, she will check her sugars and adjust her Humulin as necessary. Continue Humulin 70/30 (13units) daily otherwise. I would like her  to perform some rehab for the rotator cuff and shoulder, she prefers home therapy/formal PT.  We did print out a customized handout for her shoulder and rotator cuff, my athletic trainer Isabelle Course did review these in the room with her today.  She will perform these once daily. We will allow her to take Celebrex 100 mg daily as needed for more breakthrough pain, but does not need to take this consistently.  We will follow-up in about 4 to 6 weeks to reevaluate the shoulder.  Follow-up: Return in about 5 weeks (around 05/13/2023) for L-shoulder.   Meds & Orders:  Meds ordered this encounter  Medications   DISCONTD: celecoxib (CELEBREX) 100 MG capsule    Sig: Take 1 capsule (100 mg total) by mouth 2 (two) times daily.    Dispense:  40 capsule    Refill:  1   celecoxib (CELEBREX) 100 MG capsule    Sig: Take 1 capsule (100 mg total) by mouth daily.    Dispense:  40 capsule    Refill:  0    Orders Placed This Encounter  Procedures   Large Joint Inj     Procedures: Large Joint Inj: L subacromial bursa on 04/08/2023 1:38 PM Indications: pain Details: 22 G 1.5 in needle, posterior approach Medications: 2 mL lidocaine 1 %; 2 mL bupivacaine 0.25 %; 40 mg methylPREDNISolone acetate 40 MG/ML Outcome: tolerated well, no immediate complications  Subacromial Joint Injection, Left Shoulder After discussion on risks/benefits/indications, informed verbal consent was obtained. A timeout was then performed. Patient was seated on table  in exam room. The patient's shoulder was prepped with betadine and alcohol swabs and utilizing posterior approach a 22G, 1.5" needle was directed anteriorly and laterally into the patient's subacromial space was injected with 2:2:1 mixture of lidocaine:bupivicaine:depomedrol with appreciation of free-flowing of the injectate into the bursal space. Patient tolerated the procedure well without immediate complications.   Procedure, treatment alternatives, risks and benefits explained,  specific risks discussed. Consent was given by the patient. Immediately prior to procedure a time out was called to verify the correct patient, procedure, equipment, support staff and site/side marked as required. Patient was prepped and draped in the usual sterile fashion.          Clinical History: No specialty comments available.  She reports that she quit smoking about 34 years ago. Her smoking use included cigarettes. She started smoking about 52 years ago. She has a 9 pack-year smoking history. She has never been exposed to tobacco smoke. She has never used smokeless tobacco.  Recent Labs    08/24/22 1109 11/23/22 1025 03/19/23 1526  HGBA1C 10.1* 5.4 5.7    Objective:    Physical Exam  Gen: Well-appearing, in no acute distress; non-toxic CV: Well-perfused. Warm.  Resp: Breathing unlabored on room air; no wheezing. Psych: Fluid speech in conversation; appropriate affect; normal thought process Neuro: Sensation intact throughout. No gross coordination deficits.   Ortho Exam - Left shoulder: + TTP at Codman's point.  There is near full active and passive range of motion but some pain extending past 145 degrees of flexion.  Positive Gerber liftoff, no gross weakness with rotator cuff testing other than secondary to pain with lift off and resisted abduction. No AC joint TTP.  Imaging: No results found.  Past Medical/Family/Surgical/Social History: Medications & Allergies reviewed per EMR, new medications updated. Patient Active Problem List   Diagnosis Date Noted   Pain in left hip 01/04/2022   Trochanteric bursitis, left hip 11/07/2021   Microalbuminuria due to type 2 diabetes mellitus (HCC) 06/17/2019   Controlled type 2 diabetes mellitus without complication, with long-term current use of insulin (HCC) 10/23/2018   Secondary hyperhidrosis- not focal in nature but primarily from waist up is the worst 08/20/2018   Low serum progesterone 05/30/2018   Excessive sweating-  likely due to hormonal 11/11/2017   Elevated serum creatinine 09/04/2017   Elevated ALT measurement 09/04/2017   Postmenopausal- 30 yrs ago TAH "yrs ago" 11/12/2016   Hormone imbalance- txed by gyn 11/12/2016   Primary insomnia 11/12/2016   Mood disorder (HCC)- mixed anxiety and depression 11/12/2016   Vitamin D deficiency 09/28/2016   Mixed diabetic hyperlipidemia associated with type 2 diabetes mellitus (HCC) 09/28/2016   Hypokalemia 02/01/2016   Hypertension associated with diabetes (HCC) 01/31/2016   Generalized anxiety disorder 01/31/2016   Surgical menopause 12/23/2014   Status post abdominal hysterectomy 12/23/2014   History of cervical dysplasia 12/23/2014   SUI (stress urinary incontinence, female) 12/23/2014   Obesity, Class I, BMI 30-34.9 12/23/2014   Past Medical History:  Diagnosis Date   Anxiety    Cancer (HCC) 2016   Skin CA squamous cell on bilateral feet removed   Cervical dysplasia    h/o   Colon polyps    polyps on first scope ~ 2006, no recurrent polyps ~ 2001 and in 2016.    Diabetes mellitus without complication (HCC)    Endometriosis    Family history of adverse reaction to anesthesia    Mother had difficulty waking & nausea   Headache  Hypertension    Insomnia    Pancreatitis 01/2016   Recurrent cold sores    Family History  Problem Relation Age of Onset   Diabetes Father    Heart disease Father    Breast cancer Sister 53   Diabetes Brother    Colon cancer Neg Hx    Ovarian cancer Neg Hx    Past Surgical History:  Procedure Laterality Date   ABDOMINAL HYSTERECTOMY     tah/bso- endometriosis   CHOLECYSTECTOMY     ERCP N/A 02/03/2016   Procedure: ENDOSCOPIC RETROGRADE CHOLANGIOPANCREATOGRAPHY (ERCP);  Surgeon: Jeani Hawking, MD;  Location: [facility] ENDOSCOPY;  Service: Endoscopy;  Laterality: N/A;   KNEE ARTHROSCOPY     tonsillectomy     TONSILLECTOMY     TUBAL LIGATION     Social History   Occupational History   Not on file  Tobacco Use    Smoking status: Former    Current packs/day: 0.00    Average packs/day: 0.5 packs/day for 18.0 years (9.0 ttl pk-yrs)    Types: Cigarettes    Start date: 04/30/1970    Quit date: 04/30/1988    Years since quitting: 34.9    Passive exposure: Never   Smokeless tobacco: Never  Vaping Use   Vaping status: Never Used  Substance and Sexual Activity   Alcohol use: Not Currently   Drug use: Yes    Types: Hashish   Sexual activity: Yes    Partners: Male    Birth control/protection: Surgical

## 2023-04-11 ENCOUNTER — Other Ambulatory Visit: Payer: Self-pay | Admitting: Nurse Practitioner

## 2023-04-11 DIAGNOSIS — E1169 Type 2 diabetes mellitus with other specified complication: Secondary | ICD-10-CM

## 2023-04-15 ENCOUNTER — Other Ambulatory Visit: Payer: Self-pay | Admitting: Family Medicine

## 2023-04-15 DIAGNOSIS — E1169 Type 2 diabetes mellitus with other specified complication: Secondary | ICD-10-CM

## 2023-04-16 ENCOUNTER — Other Ambulatory Visit: Payer: Self-pay | Admitting: Nurse Practitioner

## 2023-04-16 DIAGNOSIS — B009 Herpesviral infection, unspecified: Secondary | ICD-10-CM

## 2023-04-17 NOTE — Telephone Encounter (Signed)
Hey morgan. You are listed as the patient's PCP. Herbert Seta, NP

## 2023-04-26 ENCOUNTER — Other Ambulatory Visit: Payer: Self-pay | Admitting: Family Medicine

## 2023-04-26 DIAGNOSIS — E119 Type 2 diabetes mellitus without complications: Secondary | ICD-10-CM

## 2023-05-13 ENCOUNTER — Ambulatory Visit: Payer: Medicare Other | Admitting: Sports Medicine

## 2023-05-13 ENCOUNTER — Encounter: Payer: Self-pay | Admitting: Sports Medicine

## 2023-05-13 ENCOUNTER — Other Ambulatory Visit (INDEPENDENT_AMBULATORY_CARE_PROVIDER_SITE_OTHER): Payer: Medicare Other

## 2023-05-13 DIAGNOSIS — G8929 Other chronic pain: Secondary | ICD-10-CM | POA: Diagnosis not present

## 2023-05-13 DIAGNOSIS — M12812 Other specific arthropathies, not elsewhere classified, left shoulder: Secondary | ICD-10-CM

## 2023-05-13 DIAGNOSIS — M25512 Pain in left shoulder: Secondary | ICD-10-CM

## 2023-05-13 DIAGNOSIS — M1612 Unilateral primary osteoarthritis, left hip: Secondary | ICD-10-CM | POA: Diagnosis not present

## 2023-05-13 MED ORDER — CELECOXIB 100 MG PO CAPS: 100.0000 mg | ORAL_CAPSULE | Freq: Every day | ORAL | 0 refills | Status: AC

## 2023-05-13 NOTE — Progress Notes (Signed)
 Patient says that she did not get any relief from the injection into her shoulder. She says she went to her massage therapist who also helped her with her home exercises. She says that it does not feel any worse, but it does not feel any better either.  Patient mentions that her hip is also in pain again. She says that her shoulder is her main concern.

## 2023-05-13 NOTE — Progress Notes (Signed)
 Mercedes Webb - 68 y.o. female MRN [id number]  Date of birth: 20-Sep-1955  Office Visit Note: Visit Date: 05/13/2023 PCP: Wallace Joesph LABOR, PA Referred by: Wallace Joesph LABOR, PA  Subjective: Chief Complaint  Patient presents with   Left Shoulder - Follow-up   HPI: Mercedes Webb is a pleasant 68 y.o. female who presents today for follow-up of left shoulder pain and left hip osteoarthritis.  Left shoulder -1 month ago we did perform subacromial joint injection, unfortunately this did not give her really any pain relief.  She has been compliant with her home exercise therapy, did have modifications made with her PT/therapist to avoid provocative motions. She does use her Celebrex  100 mg once daily as needed, tries to not take this consistently but for only breakthrough pain.  Left hip - left hip is starting to bother her again.  We did perform hip injection back on 03/25/2023 which was helpful.  Pain is more so over the posterior lateral aspect of the hip.  Pertinent ROS were reviewed with the patient and found to be negative unless otherwise specified above in HPI.   Assessment & Plan: Visit Diagnoses:  1. Chronic left shoulder pain   2. Unilateral primary osteoarthritis, left hip   3. Rotator cuff arthropathy, left    Plan: Impression is chronic left shoulder pain which does seem to emanate from her rotator cuff origin, although interestingly she did not receive much relief from subacromial joint injection.  She has been doing PT and home therapy without much relief.  We will order an MRI to evaluate the rotator cuff quality and evaluate any other intra-articular pathology.  She will continue her hip abduction and hip strengthens Asian exercises, we discussed we could always consider repeating intra-articular hip injections greater than 3 months apart.  For her pains, she will take Celebrex  100 mg daily only as needed, I did refill this with her today.  She will follow-up 1 week  after MRI of the shoulder to discuss next steps.  Follow-up: Return for f/u in 1-week after MRI.   Meds & Orders:  Meds ordered this encounter  Medications   celecoxib  (CELEBREX ) 100 MG capsule    Sig: Take 1 capsule (100 mg total) by mouth daily.    Dispense:  40 capsule    Refill:  0    Orders Placed This Encounter  Procedures   XR HIP UNILAT W OR W/O PELVIS 2-3 VIEWS LEFT   MR Shoulder Left w/o contrast     Procedures: No procedures performed      Clinical History: No specialty comments available.  She reports that she quit smoking about 35 years ago. Her smoking use included cigarettes. She started smoking about 53 years ago. She has a 9 pack-year smoking history. She has never been exposed to tobacco smoke. She has never used smokeless tobacco.  Recent Labs    08/24/22 1109 11/23/22 1025 03/19/23 1526  HGBA1C 10.1* 5.4 5.7    Objective:    Physical Exam  Gen: Well-appearing, in no acute distress; non-toxic CV: Well-perfused. Warm.  Resp: Breathing unlabored on room air; no wheezing. Psych: Fluid speech in conversation; appropriate affect; normal thought process  Ortho Exam - Left shoulder: + TTP at Codman's point.  There is equivocal active and passive range of motion in all directions.  Positive drop arm and empty can testing.  There is pain with Gerber liftoff test as well.  There is no significant weakness with rotator cuff testing  however.  There is no restriction to internal or external rotation with elbows at her side.  Imaging: XR HIP UNILAT W OR W/O PELVIS 2-3 VIEWS LEFT Result Date: 05/13/2023 2 views of the left hip including AP and lateral femoral ordered and reviewed by myself.  X-rays demonstrate mild to moderate osteoarthritic change of the left hip.  There is a small degree of subchondral sclerosis over the acetabulum near the rim.  No breakdown or loss of the femoral head neck juncture, no acute fracture.  This appears similar to previous x-rays back  in 2023 without advanced progression.  03/25/23: 3 views of the left shoulder including AP, Grashey, and axial view were  ordered and reviewed by myself.  X-rays show a well-seated humeral head.   There is mild to moderate AC joint arthritis.  No bony fracture or  otherwise acute bony abnormality.   Narrative & Impression  CLINICAL DATA:  Left hip pain for 1 year.   EXAM: MR OF THE LEFT HIP WITHOUT CONTRAST   TECHNIQUE: Multiplanar, multisequence MR imaging was performed. No intravenous contrast was administered.   COMPARISON:  Radiographs 11/07/2021   FINDINGS: Mild/moderate bilateral hip joint degenerative changes with degenerative chondrosis and early joint space narrowing. Minimal subchondral cystic changes. No hip joint effusion. No periarticular fluid collections to suggest a paralabral cyst. There is a anterior superior labral tear involving the left hip.   No hip stress fracture or AVN.   The pubic symphysis and SI joints are intact. No pelvic fractures or bone lesions.   Minimal bilateral peritendinitis but no trochanteric bursitis. The hip and pelvic musculature are unremarkable. No muscle tear, myositis or mass. The hamstring tendons are intact. Mild proximal hamstring tendinopathy bilaterally.   No significant intrapelvic abnormalities. No inguinal mass or hernia.   IMPRESSION: 1. Mild/moderate bilateral hip joint degenerative changes but no stress fracture or AVN. 2. Anterior superior labral tear involving the left hip. 3. Minimal bilateral peritendinitis but no trochanteric bursitis. 4. No significant intrapelvic abnormalities.     Electronically Signed   By: MYRTIS Stammer M.D.   On: 01/04/2022 07:56    Past Medical/Family/Surgical/Social History: Medications & Allergies reviewed per EMR, new medications updated. Patient Active Problem List   Diagnosis Date Noted   Pain in left hip 01/04/2022   Trochanteric bursitis, left hip 11/07/2021    Microalbuminuria due to type 2 diabetes mellitus (HCC) 06/17/2019   Controlled type 2 diabetes mellitus without complication, with long-term current use of insulin  (HCC) 10/23/2018   Secondary hyperhidrosis- not focal in nature but primarily from waist up is the worst 08/20/2018   Low serum progesterone  05/30/2018   Excessive sweating- likely due to hormonal 11/11/2017   Elevated serum creatinine 09/04/2017   Elevated ALT measurement 09/04/2017   Postmenopausal- 30 yrs ago TAH yrs ago 11/12/2016   Hormone imbalance- txed by gyn 11/12/2016   Primary insomnia 11/12/2016   Mood disorder (HCC)- mixed anxiety and depression 11/12/2016   Vitamin D  deficiency 09/28/2016   Mixed diabetic hyperlipidemia associated with type 2 diabetes mellitus (HCC) 09/28/2016   Hypokalemia 02/01/2016   Hypertension associated with diabetes (HCC) 01/31/2016   Generalized anxiety disorder 01/31/2016   Surgical menopause 12/23/2014   Status post abdominal hysterectomy 12/23/2014   History of cervical dysplasia 12/23/2014   SUI (stress urinary incontinence, female) 12/23/2014   Obesity, Class I, BMI 30-34.9 12/23/2014   Past Medical History:  Diagnosis Date   Anxiety    Cancer (HCC) 2016   Skin CA  squamous cell on bilateral feet removed   Cervical dysplasia    h/o   Colon polyps    polyps on first scope ~ 2006, no recurrent polyps ~ 2001 and in 2016.    Diabetes mellitus without complication (HCC)    Endometriosis    Family history of adverse reaction to anesthesia    Mother had difficulty waking & nausea   Headache    Hypertension    Insomnia    Pancreatitis 01/2016   Recurrent cold sores    Family History  Problem Relation Age of Onset   Diabetes Father    Heart disease Father    Breast cancer Sister 82   Diabetes Brother    Colon cancer Neg Hx    Ovarian cancer Neg Hx    Past Surgical History:  Procedure Laterality Date   ABDOMINAL HYSTERECTOMY     tah/bso- endometriosis    CHOLECYSTECTOMY     ERCP N/A 02/03/2016   Procedure: ENDOSCOPIC RETROGRADE CHOLANGIOPANCREATOGRAPHY (ERCP);  Surgeon: Belvie Just, MD;  Location: [facility] ENDOSCOPY;  Service: Endoscopy;  Laterality: N/A;   KNEE ARTHROSCOPY     tonsillectomy     TONSILLECTOMY     TUBAL LIGATION     Social History   Occupational History   Not on file  Tobacco Use   Smoking status: Former    Current packs/day: 0.00    Average packs/day: 0.5 packs/day for 18.0 years (9.0 ttl pk-yrs)    Types: Cigarettes    Start date: 04/30/1970    Quit date: 04/30/1988    Years since quitting: 35.0    Passive exposure: Never   Smokeless tobacco: Never  Vaping Use   Vaping status: Never Used  Substance and Sexual Activity   Alcohol use: Not Currently   Drug use: Yes    Types: Hashish   Sexual activity: Yes    Partners: Male    Birth control/protection: Surgical

## 2023-05-20 ENCOUNTER — Other Ambulatory Visit: Payer: Self-pay | Admitting: Family Medicine

## 2023-05-20 DIAGNOSIS — E1169 Type 2 diabetes mellitus with other specified complication: Secondary | ICD-10-CM

## 2023-05-30 ENCOUNTER — Other Ambulatory Visit: Payer: Self-pay | Admitting: Family Medicine

## 2023-05-30 DIAGNOSIS — E119 Type 2 diabetes mellitus without complications: Secondary | ICD-10-CM

## 2023-05-31 ENCOUNTER — Ambulatory Visit
Admission: RE | Admit: 2023-05-31 | Discharge: 2023-05-31 | Disposition: A | Discharge status: Home / Self Care | Payer: Medicare Other | Source: Ambulatory Visit | Attending: Sports Medicine

## 2023-05-31 DIAGNOSIS — G8929 Other chronic pain: Secondary | ICD-10-CM

## 2023-05-31 DIAGNOSIS — M7582 Other shoulder lesions, left shoulder: Secondary | ICD-10-CM | POA: Diagnosis not present

## 2023-05-31 DIAGNOSIS — M12812 Other specific arthropathies, not elsewhere classified, left shoulder: Secondary | ICD-10-CM

## 2023-05-31 DIAGNOSIS — M25512 Pain in left shoulder: Secondary | ICD-10-CM | POA: Diagnosis not present

## 2023-06-06 ENCOUNTER — Encounter: Payer: Self-pay | Admitting: Family Medicine

## 2023-06-13 ENCOUNTER — Other Ambulatory Visit: Payer: Medicare Other

## 2023-06-13 DIAGNOSIS — I152 Hypertension secondary to endocrine disorders: Secondary | ICD-10-CM

## 2023-06-13 DIAGNOSIS — E1159 Type 2 diabetes mellitus with other circulatory complications: Secondary | ICD-10-CM | POA: Diagnosis not present

## 2023-06-13 DIAGNOSIS — E1169 Type 2 diabetes mellitus with other specified complication: Secondary | ICD-10-CM

## 2023-06-13 DIAGNOSIS — E782 Mixed hyperlipidemia: Secondary | ICD-10-CM | POA: Diagnosis not present

## 2023-06-14 LAB — LIPID PANEL
Chol/HDL Ratio: 3.9 {ratio} (ref 0.0–4.4)
Cholesterol, Total: 112 mg/dL (ref 100–199)
HDL: 29 mg/dL — ABNORMAL LOW (ref >39)
LDL Chol Calc (NIH): 45 mg/dL (ref 0–99)
Triglycerides: 239 mg/dL — ABNORMAL HIGH (ref 0–149)
VLDL Cholesterol Cal: 38 mg/dL (ref 5–40)

## 2023-06-14 LAB — COMPREHENSIVE METABOLIC PANEL
ALT: 24 [IU]/L (ref 0–32)
AST: 16 [IU]/L (ref 0–40)
Albumin: 4.5 g/dL (ref 3.9–4.9)
Alkaline Phosphatase: 87 [IU]/L (ref 44–121)
BUN/Creatinine Ratio: 21 (ref 12–28)
BUN: 29 mg/dL — ABNORMAL HIGH (ref 8–27)
Bilirubin Total: 0.5 mg/dL (ref 0.0–1.2)
CO2: 22 mmol/L (ref 20–29)
Calcium: 9.9 mg/dL (ref 8.7–10.3)
Chloride: 104 mmol/L (ref 96–106)
Creatinine, Ser: 1.37 mg/dL — ABNORMAL HIGH (ref 0.57–1.00)
Globulin, Total: 2.7 g/dL (ref 1.5–4.5)
Glucose: 148 mg/dL — ABNORMAL HIGH (ref 70–99)
Potassium: 4 mmol/L (ref 3.5–5.2)
Sodium: 142 mmol/L (ref 134–144)
Total Protein: 7.2 g/dL (ref 6.0–8.5)
eGFR: 42 mL/min/{1.73_m2} — ABNORMAL LOW (ref >59)

## 2023-06-18 ENCOUNTER — Encounter: Payer: Self-pay | Admitting: Family Medicine

## 2023-06-19 ENCOUNTER — Encounter: Payer: Self-pay | Admitting: Family Medicine

## 2023-06-19 ENCOUNTER — Telehealth (INDEPENDENT_AMBULATORY_CARE_PROVIDER_SITE_OTHER): Payer: Medicare Other | Admitting: Family Medicine

## 2023-06-19 DIAGNOSIS — E66811 Obesity, class 1: Secondary | ICD-10-CM

## 2023-06-19 DIAGNOSIS — Z6827 Body mass index (BMI) 27.0-27.9, adult: Secondary | ICD-10-CM

## 2023-06-19 DIAGNOSIS — E119 Type 2 diabetes mellitus without complications: Secondary | ICD-10-CM

## 2023-06-19 DIAGNOSIS — E1159 Type 2 diabetes mellitus with other circulatory complications: Secondary | ICD-10-CM

## 2023-06-19 DIAGNOSIS — I152 Hypertension secondary to endocrine disorders: Secondary | ICD-10-CM | POA: Diagnosis not present

## 2023-06-19 DIAGNOSIS — Z7985 Long-term (current) use of injectable non-insulin antidiabetic drugs: Secondary | ICD-10-CM

## 2023-06-19 DIAGNOSIS — E1169 Type 2 diabetes mellitus with other specified complication: Secondary | ICD-10-CM | POA: Diagnosis not present

## 2023-06-19 DIAGNOSIS — Z794 Long term (current) use of insulin: Secondary | ICD-10-CM | POA: Diagnosis not present

## 2023-06-19 DIAGNOSIS — E782 Mixed hyperlipidemia: Secondary | ICD-10-CM

## 2023-06-19 MED ORDER — ASPIRIN 81 MG PO TBEC
81.0000 mg | DELAYED_RELEASE_TABLET | Freq: Every day | ORAL | 0 refills | Status: DC
Start: 2023-06-19 — End: 2023-09-24

## 2023-06-19 MED ORDER — LOSARTAN POTASSIUM 25 MG PO TABS: ORAL_TABLET | ORAL | 1 refills | Status: DC

## 2023-06-19 MED ORDER — ATORVASTATIN CALCIUM 10 MG PO TABS: ORAL_TABLET | ORAL | 1 refills | Status: DC

## 2023-06-19 MED ORDER — FENOFIBRATE 48 MG PO TABS
48.0000 mg | ORAL_TABLET | Freq: Every day | ORAL | 0 refills | Status: DC
Start: 2023-06-19 — End: 2023-09-24

## 2023-06-19 MED ORDER — TRIAMTERENE-HCTZ 37.5-25 MG PO TABS: 1.0000 | ORAL_TABLET | Freq: Every day | ORAL | 0 refills | Status: DC

## 2023-06-19 NOTE — Assessment & Plan Note (Signed)
BP goal <130/80. Stable, at goal.  Continue losartan and triamterene-hydrochlorothiazide 37.5-25 mg daily.  Will continue to monitor.

## 2023-06-19 NOTE — Assessment & Plan Note (Signed)
 Last lipid panel: LDL 45, HDL 29, triglycerides 239.  Triglycerides worsened since last check.  Continue atorvastatin 10 mg daily, fenofibrate 48 mg daily. Will continue to monitor.

## 2023-06-19 NOTE — Assessment & Plan Note (Signed)
 Most recent. A1c 5.7, very well-controlled.  Continue Mounjaro 10 mg weekly and 13 units insulin each morning.  Eye exam completed.  Will continue to monitor.

## 2023-06-19 NOTE — Progress Notes (Signed)
 Virtual Visit via Video Note  I connected with Mercedes Webb on 06/19/23 at  1:50 PM EST by a video enabled telemedicine application and verified that I am speaking with the correct person using two identifiers.  Patient Location: Home Provider Location: Office/clinic  I discussed the limitations, risks, security, and privacy concerns of performing an evaluation and management service by video and the availability of in person appointments. I also discussed with the patient that there may be a patient responsible charge related to this service. The patient expressed understanding and agreed to proceed.    Subjective   Patient ID: Mercedes Webb, female    DOB: 15-May-1955  Age: 68 y.o. MRN: [id number]  Chief Complaint  Patient presents with   Hypertension   Hyperlipidemia   Diabetes    HPI Mercedes Webb is a 68 y.o. female presenting today for follow up of hypertension, hyperlipidemia, diabetes.  She notes that she has been taking celecoxib every day for shoulder pain Hypertension:  Pt denies chest pain, SOB, dizziness, edema, syncope, fatigue or heart palpitations. Taking losartan, reports excellent compliance with treatment. Denies side effects. Hyperlipidemia: tolerating atorvastatin 10 mg daily well with no myalgias or significant side effects.  The ASCVD Risk score (Arnett DK, et al., 2019) failed to calculate for the following reasons:   The valid total cholesterol range is 130 to 320 mg/dL Diabetes: denies hypoglycemic events, wounds or sores that are not healing well, increased thirst or urination. Denies vision problems, eye exam due. Taking Mounjaro and insulin as prescribed without any side effects.   ROS Negative unless otherwise noted in HPI  Outpatient Medications Prior to Visit  Medication Sig   Accu-Chek Softclix Lancets lancets USE 1  TO CHECK GLUCOSE 4 TIMES DAILY   Ascorbic Acid (VITAMIN C) 1000 MG tablet Take 1,000 mg by mouth daily.   B-D UF III  MINI PEN NEEDLES 31G X 5 MM MISC USE  THREE TIMES DAILY   blood glucose meter kit and supplies Dispense based on patient and insurance preference. Use to check fasting blood sugar once daily.   Calcium Carbonate (CALTRATE 600 PO) Take 600 mg by mouth daily.   celecoxib (CELEBREX) 100 MG capsule Take 1 capsule (100 mg total) by mouth daily.   glucose blood (ACCU-CHEK GUIDE TEST) test strip USE  TO CHECK GLUCOSE 4 TIMES DAILY   insulin isophane & regular human KwikPen (HUMULIN 70/30 KWIKPEN) (70-30) 100 UNIT/ML KwikPen Inject 13 units into the skin in the morning and 20 units into the skin at bedtime.   MOUNJARO 10 MG/0.5ML Pen INJECT 10 MG INTO THE SKIN ONCE A WEEK.   Multiple Vitamin (MULTIVITAMIN WITH MINERALS) TABS tablet Take 1 tablet by mouth daily. Centrum Silver   valACYclovir (VALTREX) 500 MG tablet TAKE 1 TABLET BY MOUTH DAILY.   zolpidem (AMBIEN CR) 12.5 MG CR tablet Take 12.5 mg by mouth at bedtime.   [DISCONTINUED] aspirin EC (ASPIRIN LOW DOSE) 81 MG tablet Take 1 tablet (81 mg total) by mouth daily.   [DISCONTINUED] atorvastatin (LIPITOR) 10 MG tablet TAKE 1 TABLET BY MOUTH NIGHTLY.   [DISCONTINUED] fenofibrate (TRICOR) 48 MG tablet Take 1 tablet (48 mg total) by mouth daily.   [DISCONTINUED] losartan (COZAAR) 25 MG tablet TAKE 1 TABLET BY MOUTH DAILY   [DISCONTINUED] omega-3 acid ethyl esters (LOVAZA) 1 g capsule Take 2 capsules (2 g total) by mouth 2 (two) times daily.   [DISCONTINUED] triamterene-hydrochlorothiazide (MAXZIDE-25) 37.5-25 MG tablet Take 1 tablet by  mouth daily.   [DISCONTINUED] venlafaxine XR (EFFEXOR-XR) 150 MG 24 hr capsule Take 150 mg by mouth every morning.   No facility-administered medications prior to visit.     Objective:     Physical Exam General: Speaking clearly in complete sentences without any shortness of breath.  Alert and oriented x3.  Normal judgment. No apparent acute distress.   Assessment & Plan:  Controlled type 2 diabetes mellitus  without complication, with long-term current use of insulin (HCC) Assessment & Plan: Most recent. A1c 5.7, very well-controlled.  Continue Mounjaro 10 mg weekly and 13 units insulin each morning.  Eye exam completed.  Will continue to monitor.  Orders: -     Losartan Potassium; TAKE 1 TABLET BY MOUTH DAILY  Dispense: 90 tablet; Refill: 1  Hypertension associated with diabetes (HCC) Assessment & Plan: BP goal <130/80. Stable, at goal.  Continue losartan and triamterene-hydrochlorothiazide 37.5-25 mg daily.  Will continue to monitor.   Orders: -     Losartan Potassium; TAKE 1 TABLET BY MOUTH DAILY  Dispense: 90 tablet; Refill: 1 -     Triamterene-HCTZ; Take 1 tablet by mouth daily.  Dispense: 90 tablet; Refill: 0 -     Aspirin; Take 1 tablet (81 mg total) by mouth daily.  Dispense: 90 tablet; Refill: 0  Obesity, Class I, BMI 30-34.9 -     Aspirin; Take 1 tablet (81 mg total) by mouth daily.  Dispense: 90 tablet; Refill: 0  Mixed diabetic hyperlipidemia associated with type 2 diabetes mellitus (HCC) Assessment & Plan: Last lipid panel: LDL 45, HDL 29, triglycerides 239.  Triglycerides worsened since last check.  Continue atorvastatin 10 mg daily, fenofibrate 48 mg daily. Will continue to monitor.  Orders: -     Fenofibrate; Take 1 tablet (48 mg total) by mouth daily.  Dispense: 90 tablet; Refill: 0 -     Atorvastatin Calcium; TAKE 1 TABLET BY MOUTH NIGHTLY.  Dispense: 90 tablet; Refill: 1  Significant decrease in GFR to 42 and increase in creatinine to 1.37, this is likely secondary to daily celecoxib.  Discussed discontinuing daily use of celecoxib, save for breakthrough pain.  Instead, take Tylenol 500 mg every 4 hours if needed.  Return in about 4 months (around 10/17/2023) for follow-up for HTN, DM, fasting labs 1 week before (CMP, A1C).   I discussed the assessment and treatment plan with the patient. The patient was provided an opportunity to ask questions, and all were answered. The  patient agreed with the plan and demonstrated an understanding of the instructions.   The patient was advised to call back or seek an in-person evaluation if the symptoms worsen or if the condition fails to improve as anticipated.  The above assessment and management plan was discussed with the patient. The patient verbalized understanding of and has agreed to the management plan.   [name], PA

## 2023-06-25 ENCOUNTER — Encounter: Payer: Self-pay | Admitting: Family Medicine

## 2023-06-25 ENCOUNTER — Ambulatory Visit (INDEPENDENT_AMBULATORY_CARE_PROVIDER_SITE_OTHER): Payer: Medicare Other | Admitting: Family Medicine

## 2023-06-25 DIAGNOSIS — Z794 Long term (current) use of insulin: Secondary | ICD-10-CM

## 2023-06-25 DIAGNOSIS — E119 Type 2 diabetes mellitus without complications: Secondary | ICD-10-CM

## 2023-06-25 LAB — POCT GLYCOSYLATED HEMOGLOBIN (HGB A1C): Hemoglobin A1C: 5.2 % (ref 4.0–5.6)

## 2023-06-25 LAB — POCT UA - MICROALBUMIN
Creatinine, POC: 300 mg/dL
Microalbumin Ur, POC: 150 mg/L

## 2023-06-25 NOTE — Progress Notes (Signed)
 Pt in for poc urine micro and poc A1c.

## 2023-06-26 MED ORDER — MOUNJARO 10 MG/0.5ML ~~LOC~~ SOAJ: 10.0000 mg | SUBCUTANEOUS | 3 refills | Status: DC

## 2023-06-27 ENCOUNTER — Encounter: Payer: Self-pay | Admitting: Sports Medicine

## 2023-06-27 ENCOUNTER — Other Ambulatory Visit: Payer: Self-pay

## 2023-06-27 ENCOUNTER — Ambulatory Visit (INDEPENDENT_AMBULATORY_CARE_PROVIDER_SITE_OTHER): Payer: Medicare Other | Admitting: Sports Medicine

## 2023-06-27 DIAGNOSIS — R7989 Other specified abnormal findings of blood chemistry: Secondary | ICD-10-CM | POA: Diagnosis not present

## 2023-06-27 DIAGNOSIS — E1169 Type 2 diabetes mellitus with other specified complication: Secondary | ICD-10-CM | POA: Diagnosis not present

## 2023-06-27 DIAGNOSIS — M25512 Pain in left shoulder: Secondary | ICD-10-CM

## 2023-06-27 DIAGNOSIS — Z794 Long term (current) use of insulin: Secondary | ICD-10-CM

## 2023-06-27 DIAGNOSIS — M67814 Other specified disorders of tendon, left shoulder: Secondary | ICD-10-CM

## 2023-06-27 DIAGNOSIS — G8929 Other chronic pain: Secondary | ICD-10-CM | POA: Diagnosis not present

## 2023-06-27 MED ORDER — METHYLPREDNISOLONE ACETATE 40 MG/ML IJ SUSP
40.0000 mg | INTRAMUSCULAR | Status: AC | PRN
Start: 2023-06-27 — End: 2023-06-27
  Administered 2023-06-27: 40 mg via INTRA_ARTICULAR

## 2023-06-27 MED ORDER — LIDOCAINE HCL 1 % IJ SOLN
2.0000 mL | INTRAMUSCULAR | Status: AC | PRN
  Administered 2023-06-27: 2 mL

## 2023-06-27 MED ORDER — BUPIVACAINE HCL 0.25 % IJ SOLN
2.0000 mL | INTRAMUSCULAR | Status: AC | PRN
  Administered 2023-06-27: 2 mL via INTRA_ARTICULAR

## 2023-06-27 NOTE — Progress Notes (Signed)
 Patient says that she did not have any relief from the hip injection. She is inquiring about any other options based on the MRI of her shoulder, whether that be physical therapy or other treatment options.

## 2023-06-27 NOTE — Progress Notes (Signed)
 Mercedes Webb - 68 y.o. female MRN [id number]  Date of birth: 16-Jul-1955  Office Visit Note: Visit Date: 06/27/2023 PCP: [name], PA Referred by: [name], PA  Subjective: Chief Complaint  Patient presents with   Left Shoulder - Follow-up   HPI: Mercedes Webb is a pleasant 68 y.o. female who presents today for follow-up of left shoulder pain, here for MRI review.  Left shoulder - still is bothering her, working with home exercises and her PTA.  She is inquiring about dry needling or other treatment options.  She does feel some pain with reaching maneuvers, feels like the left shoulder slightly weaker with reaching compared to the right.  The Celebrex medication was helpful although she had transient increase in her creatinine and was told to stop this by her PCP.  DM II - has been very well controlled. She is managed on Humulin 70/30 13units daily - they are lowering her early AM dosing. Her sugars stayed well controlled even after injections.   Lab Results  Component Value Date   HGBA1C 5.2 06/25/2023   Pertinent ROS were reviewed with the patient and found to be negative unless otherwise specified above in HPI.   Assessment & Plan: Visit Diagnoses:  1. Chronic left shoulder pain   2. Tendinosis of left rotator cuff   3. Type 2 diabetes mellitus with other specified complication, with long-term current use of insulin (HCC)   4. Elevated serum creatinine    Plan: Impression is chronic left shoulder pain with MRI tendinosis of the supraspinatus and bicep tendon.  There may be some small undersurface tearing of the supraspinatus but no full-thickness tearing or retraction.  Through shared decision-making, we did proceed with ultrasound-guided glenohumeral joint injection for a final injection to try to improve her pain.  We will send her to formalized physical therapy to work on rehab but more specifically dry needling and other soft tissue treatment  modalities and attempt to help improve her muscular hypertonicity and range of motion.  Given her increasing creatinine, we will discontinue her Celebrex altogether, could consider this sparingly in the future but will wait for recheck of her creatinine with her PCP.  She will continue checking her sugars and monitoring although this has been very well-controlled.  She will message me an update after the injection and after she has 3-4 sessions of PT to see where she is at in terms of improvement.  If for some reason she is not largely improved, may have her see Dr. August Saucer for further shoulder evaluation.  Follow-up: Return for she will update me after 3-4 sessions of PT .   Meds & Orders: No orders of the defined types were placed in this encounter.   Orders Placed This Encounter  Procedures   Large Joint Inj   US Guided Needle Placement - No Linked Charges   Ambulatory referral to Physical Therapy     Procedures: Large Joint Inj: L glenohumeral on 06/27/2023 8:33 AM Indications: pain Details: 22 G 3.5 in needle, ultrasound-guided posterior approach Medications: 2 mL lidocaine 1 %; 2 mL bupivacaine 0.25 %; 40 mg methylPREDNISolone acetate 40 MG/ML Outcome: tolerated well, no immediate complications  US-guided glenohumeral joint injection, left shoulder After discussion on risks/benefits/indications, informed verbal consent was obtained. A timeout was then performed. The patient was positioned lying lateral recumbent on examination table. The patient's shoulder was prepped with betadine and multiple alcohol swabs and utilizing ultrasound guidance, the patient's glenohumeral joint was  identified on ultrasound. Using ultrasound guidance a 22-gauge, 3.5 inch needle with a mixture of 2:2:1 cc's lidocaine:bupivicaine:depomedrol was directed from a lateral to medial direction via in-plane technique into the glenohumeral joint with visualization of appropriate spread of injectate into the joint. Patient  tolerated the procedure well without immediate complications.      Procedure, treatment alternatives, risks and benefits explained, specific risks discussed. Consent was given by the patient. Immediately prior to procedure a time out was called to verify the correct patient, procedure, equipment, support staff and site/side marked as required. Patient was prepped and draped in the usual sterile fashion.          Clinical History: No specialty comments available.  She reports that she quit smoking about 35 years ago. Her smoking use included cigarettes. She started smoking about 53 years ago. She has a 9 pack-year smoking history. She has never been exposed to tobacco smoke. She has never used smokeless tobacco.  Recent Labs    11/23/22 1025 03/19/23 1526 06/25/23 1025  HGBA1C 5.4 5.7 5.2    Objective:    Physical Exam  Gen: Well-appearing, in no acute distress; non-toxic CV: Well-perfused. Warm.  Resp: Breathing unlabored on room air; no wheezing. Psych: Fluid speech in conversation; appropriate affect; normal thought process  Ortho Exam - Left shoulder: Mild TTP in the bicipital groove and at Codman's point.  There is full active and passive range of motion in all directions.  Positive drop arm and empty can testing.  Speed's Test is positive.  There is mild weakness with resisted external rotation, internal rotation preserved.  Imaging:  MR Shoulder Left w/o contrast CLINICAL DATA:  Chronic left shoulder pain, decreased range of motion  EXAM: MRI OF THE LEFT SHOULDER WITHOUT CONTRAST  TECHNIQUE: Multiplanar, multisequence MR imaging of the shoulder was performed. No intravenous contrast was administered.  COMPARISON:  None Available.  FINDINGS: Rotator cuff: Mild supraspinatus tendinosis. Infraspinatus tendon is intact. Teres minor tendon is intact. Mild subscapularis tendinosis.  Muscles: No muscle atrophy or edema. No intramuscular fluid collection or  hematoma.  Biceps Long Head: Moderate tendinosis of the intra-articular portion of the long head of the biceps tendon.  Acromioclavicular Joint: Mild arthropathy of the acromioclavicular joint. No subacromial/subdeltoid bursal fluid.  Glenohumeral Joint: No joint effusion. No chondral defect.  Labrum: Grossly intact, but evaluation is limited by lack of intraarticular fluid/contrast.  Bones: No fracture or dislocation. No aggressive osseous lesion.  Other: No fluid collection or hematoma.  IMPRESSION: 1. Mild supraspinatus and subscapularis tendinosis. 2. Moderate tendinosis of the intra-articular portion of the long head of the biceps tendon.  Electronically Signed   By: Elige Ko M.D.   On: 06/08/2023 14:30  *I do agree with the read above although I think there is a slight degree of increased tendinopathy with possibly small partial tearing on the undersurface of the supraspinatus.  Past Medical/Family/Surgical/Social History: Medications & Allergies reviewed per EMR, new medications updated. Patient Active Problem List   Diagnosis Date Noted   Pain in left hip 01/04/2022   Trochanteric bursitis, left hip 11/07/2021   Microalbuminuria due to type 2 diabetes mellitus (HCC) 06/17/2019   Controlled type 2 diabetes mellitus without complication, with long-term current use of insulin (HCC) 10/23/2018   Secondary hyperhidrosis- not focal in nature but primarily from waist up is the worst 08/20/2018   Low serum progesterone 05/30/2018   Excessive sweating- likely due to hormonal 11/11/2017   Elevated serum creatinine 09/04/2017   Elevated  ALT measurement 09/04/2017   Postmenopausal- 30 yrs ago TAH "yrs ago" 11/12/2016   Hormone imbalance- txed by gyn 11/12/2016   Primary insomnia 11/12/2016   Mood disorder (HCC)- mixed anxiety and depression 11/12/2016   Vitamin D deficiency 09/28/2016   Mixed diabetic hyperlipidemia associated with type 2 diabetes mellitus (HCC)  09/28/2016   Hypokalemia 02/01/2016   Hypertension associated with diabetes (HCC) 01/31/2016   Generalized anxiety disorder 01/31/2016   Surgical menopause 12/23/2014   Status post abdominal hysterectomy 12/23/2014   History of cervical dysplasia 12/23/2014   SUI (stress urinary incontinence, female) 12/23/2014   Obesity, Class I, BMI 30-34.9 12/23/2014   Past Medical History:  Diagnosis Date   Anxiety    Cancer (HCC) 2016   Skin CA squamous cell on bilateral feet removed   Cervical dysplasia    h/o   Colon polyps    polyps on first scope ~ 2006, no recurrent polyps ~ 2001 and in 2016.    Diabetes mellitus without complication (HCC)    Endometriosis    Family history of adverse reaction to anesthesia    Mother had difficulty waking & nausea   Headache    Hypertension    Insomnia    Pancreatitis 01/2016   Recurrent cold sores    Family History  Problem Relation Age of Onset   Diabetes Father    Heart disease Father    Breast cancer Sister 69   Diabetes Brother    Colon cancer Neg Hx    Ovarian cancer Neg Hx    Past Surgical History:  Procedure Laterality Date   ABDOMINAL HYSTERECTOMY     tah/bso- endometriosis   CHOLECYSTECTOMY     ERCP N/A 02/03/2016   Procedure: ENDOSCOPIC RETROGRADE CHOLANGIOPANCREATOGRAPHY (ERCP);  Surgeon: Jeani Hawking, MD;  Location: [facility] ENDOSCOPY;  Service: Endoscopy;  Laterality: N/A;   KNEE ARTHROSCOPY     tonsillectomy     TONSILLECTOMY     TUBAL LIGATION     Social History   Occupational History   Not on file  Tobacco Use   Smoking status: Former    Current packs/day: 0.00    Average packs/day: 0.5 packs/day for 18.0 years (9.0 ttl pk-yrs)    Types: Cigarettes    Start date: 04/30/1970    Quit date: 04/30/1988    Years since quitting: 35.1    Passive exposure: Never   Smokeless tobacco: Never  Vaping Use   Vaping status: Never Used  Substance and Sexual Activity   Alcohol use: Not Currently   Drug use: Yes    Types: Hashish    Sexual activity: Yes    Partners: Male    Birth control/protection: Surgical

## 2023-07-08 ENCOUNTER — Ambulatory Visit: Payer: Medicare Other | Admitting: Physical Therapy

## 2023-07-08 ENCOUNTER — Encounter: Payer: Self-pay | Admitting: Physical Therapy

## 2023-07-08 DIAGNOSIS — M25512 Pain in left shoulder: Secondary | ICD-10-CM

## 2023-07-08 DIAGNOSIS — R600 Localized edema: Secondary | ICD-10-CM | POA: Diagnosis not present

## 2023-07-08 DIAGNOSIS — M6281 Muscle weakness (generalized): Secondary | ICD-10-CM

## 2023-07-08 DIAGNOSIS — G8929 Other chronic pain: Secondary | ICD-10-CM | POA: Diagnosis not present

## 2023-07-08 NOTE — Therapy (Signed)
 OUTPATIENT PHYSICAL THERAPY SHOULDER EVALUATION   Patient Name: Mercedes Webb MRN: [id number] DOB:Nov 03, 1955, 68 y.o., female Today's Date: 07/08/2023  END OF SESSION:  PT End of Session - 07/08/23 0943     Visit Number 1    Number of Visits 12    Date for PT Re-Evaluation 09/02/23    Authorization Type [employer] MCR    PT Start Time 0845    PT Stop Time 0935    PT Time Calculation (min) 50 min    Activity Tolerance Patient tolerated treatment well    Behavior During Therapy [facility] for tasks assessed/performed             Past Medical History:  Diagnosis Date   Anxiety    Cancer (HCC) 2016   Skin CA squamous cell on bilateral feet removed   Cervical dysplasia    h/o   Colon polyps    polyps on first scope ~ 2006, no recurrent polyps ~ 2001 and in 2016.    Diabetes mellitus without complication (HCC)    Endometriosis    Family history of adverse reaction to anesthesia    Mother had difficulty waking & nausea   Headache    Hypertension    Insomnia    Pancreatitis 01/2016   Recurrent cold sores    Past Surgical History:  Procedure Laterality Date   ABDOMINAL HYSTERECTOMY     tah/bso- endometriosis   CHOLECYSTECTOMY     ERCP N/A 02/03/2016   Procedure: ENDOSCOPIC RETROGRADE CHOLANGIOPANCREATOGRAPHY (ERCP);  Surgeon: Jeani Hawking, MD;  Location: [facility] ENDOSCOPY;  Service: Endoscopy;  Laterality: N/A;   KNEE ARTHROSCOPY     tonsillectomy     TONSILLECTOMY     TUBAL LIGATION     Patient Active Problem List   Diagnosis Date Noted   Pain in left hip 01/04/2022   Trochanteric bursitis, left hip 11/07/2021   Microalbuminuria due to type 2 diabetes mellitus (HCC) 06/17/2019   Controlled type 2 diabetes mellitus without complication, with long-term current use of insulin (HCC) 10/23/2018   Secondary hyperhidrosis- not focal in nature but primarily from waist up is the worst 08/20/2018   Low serum progesterone 05/30/2018   Excessive sweating- likely due to hormonal  11/11/2017   Elevated serum creatinine 09/04/2017   Elevated ALT measurement 09/04/2017   Postmenopausal- 30 yrs ago TAH "yrs ago" 11/12/2016   Hormone imbalance- txed by gyn 11/12/2016   Primary insomnia 11/12/2016   Mood disorder (HCC)- mixed anxiety and depression 11/12/2016   Vitamin D deficiency 09/28/2016   Mixed diabetic hyperlipidemia associated with type 2 diabetes mellitus (HCC) 09/28/2016   Hypokalemia 02/01/2016   Hypertension associated with diabetes (HCC) 01/31/2016   Generalized anxiety disorder 01/31/2016   Surgical menopause 12/23/2014   Status post abdominal hysterectomy 12/23/2014   History of cervical dysplasia 12/23/2014   SUI (stress urinary incontinence, female) 12/23/2014   Obesity, Class I, BMI 30-34.9 12/23/2014    PCP: [name], PA   REFERRING PROVIDER: Madelyn Brunner, DO   REFERRING DIAG: [id number] (ICD-10-CM) - Chronic left shoulder pain M67.814 (ICD-10-CM) - Tendinosis of left rotator cuff  THERAPY DIAG:  Chronic left shoulder pain  Muscle weakness (generalized)  Localized edema  Rationale for Evaluation and Treatment: Rehabilitation  ONSET DATE: pain since November 2024 SUBJECTIVE:  SUBJECTIVE STATEMENT: She has had pain since November 2024, insidious onset. Had injections, first one did not help, the second one did. She denies N/T, burning.   Hand dominance: Right  PERTINENT HISTORY: DM  PAIN:  NPRS scale: 6 at rest, goes to 10 sometimes with raising her arm/10  Pain location:left shoulder Pain description: intermittent, sharp Aggravating factors: raising arm, reaching, making bed, unhooking bra, cleaning Relieving factors: rest, meds  PRECAUTIONS: None  RED FLAGS: None   WEIGHT BEARING RESTRICTIONS: No  FALLS:  Has patient fallen in  last 6 months? No  OCCUPATION: retired  PLOF: Independent  PATIENT GOALS:reduce pain  NEXT MD VISIT:   OBJECTIVE:  Note: Objective measures were completed at Evaluation unless otherwise noted.  DIAGNOSTIC FINDINGS:  06/28/23 MRI IMPRESSION: 1. Mild supraspinatus and subscapularis tendinosis. 2. Moderate tendinosis of the intra-articular portion of the long head of the biceps tendon  PATIENT SURVEYS:  Patient-Specific Activity Scoring Scheme  "0" represents "unable to perform." "10" represents "able to perform at prior level. 0 1 2 3 4 5 6 7 8 9  10 (Date and Score)   Activity Eval     1. Unhooking bra 4    2. Reaching cabinets/drying hair 6    3. Making bed 7   4.    5.    Score 17/30    Total score = sum of the activity scores/number of activities Minimum detectable change (90%CI) for average score = 2 points Minimum detectable change (90%CI) for single activity score = 3 points     COGNITION: Overall cognitive status: Within functional limits for tasks assessed     SENSATION: WFL   UPPER EXTREMITY ROM:   Active ROM left eval   Shoulder flexion 135   Shoulder extension    Shoulder abduction 90   Shoulder adduction    Shoulder internal rotation L3 behind back   Shoulder external rotation 60   Elbow flexion    Elbow extension    Wrist flexion    Wrist extension    Wrist ulnar deviation    Wrist radial deviation    Wrist pronation    Wrist supination    (Blank rows = not tested)  UPPER EXTREMITY MMT:  MMT Left Eval   Shoulder flexion 4   Shoulder extension 3+   Shoulder abduction 4   Shoulder adduction    Shoulder internal rotation 4+   Shoulder external rotation 3+   Middle trapezius 3+   Lower trapezius 3+   Elbow flexion    Elbow extension    Wrist flexion    Wrist extension    Wrist ulnar deviation    Wrist radial deviation    Wrist pronation    Wrist supination    Grip strength (lbs)    (Blank rows = not tested)  TODAY'S TREATMENT:  Eval HEP creation and review with demonstration and trial set preformed, see below for details Selfcare:education provided, see below for details Trigger Point Dry Needling (not included in billable time) Initial Treatment: Pt instructed on Dry Needling rational, procedures, and possible side effects. Pt instructed to expect mild to moderate muscle soreness later in the day and/or into the next day.  Pt instructed to continue prescribed HEP. Patient verbalized understanding of these instructions and education.  Patient Verbal Consent Given: YES Education Handout Provided: No , verbally provided Muscles Treated:Left shoulder deltoids, RTC insertion, proximal biceps, supraspinatus Electrical Stimulation Performed: No Treatment Response/Outcome:  good overall tolerance,twitch response noted   -Vasopnuematic device X 10 min, medium compression, 34 deg to Left shoulder    PATIENT EDUCATION: Education details: HEP, PT plan of care, anatomy and education on exam findings and shoulder, impingement/tendinopathy, ways to decrease inflammation, do not push into pain with exercises.  DN rationale and education Person educated: Patient Education method: Explanation, Demonstration, Verbal cues, and Handouts Education comprehension: verbalized understanding and needs further education   HOME EXERCISE PROGRAM: Access Code: [facility] URL: [email or web address] Date: 07/08/2023 Prepared by: Ivery Quale  Exercises - Standing Shoulder Posterior Capsule Stretch  - 2 x daily - 6 x weekly - 1 sets - 5 reps - 10 sec hold - Doorway Pec Stretch at 90 Degrees Abduction  - 2 x daily - 6 x weekly - 1 sets - 5 reps - 10 sec hold - Standing Shoulder Internal Rotation Stretch with Towel (Mirrored)  - 2 x daily - 6 x weekly - 1 sets - 10 reps - 5 sec hold - Standing  Shoulder Row with Anchored Resistance  - 2 x daily - 6 x weekly - 2 sets - 10 reps - Shoulder Extension with Resistance - Neutral  - 2 x daily - 6 x weekly - 2 sets - 10 reps - Supine Shoulder External Rotation with Resistance  - 2 x daily - 6 x weekly - 2 sets - 10 reps - Supine Shoulder Horizontal Abduction with Resistance  - 2 x daily - 6 x weekly - 2 sets - 10 reps  ASSESSMENT:  CLINICAL IMPRESSION: Patient referred to PT for Left shoulder pain with tendinopathy and impingement. Patient interested in dry needling so we did perform this today. Patient will benefit from skilled PT to address below impairments, limitations and improve overall function.  OBJECTIVE IMPAIRMENTS: decreased activity tolerance, decreased shoulder mobility, decreased ROM, decreased strength, impaired flexibility, impaired UE use, and pain.  ACTIVITY LIMITATIONS: reaching, lifting, carry,  cleaning,  PERSONAL FACTORS:  DM also affecting patient's functional outcome.  REHAB POTENTIAL: Good  CLINICAL DECISION MAKING: Stable/uncomplicated  EVALUATION COMPLEXITY: Low    GOALS: Short term PT Goals Target date: 08/05/2023   Pt will be I and compliant with HEP. Baseline:  Goal status: New Pt will decrease pain by 25% overall with reaching Baseline: up to 10/10 Goal status: New  Long term PT goals Target date:09/02/2023   Pt will improve Lt shoulder AROM to [facility] (>145 deg flexion and abduction, >70 ER, >T6 reaching behind back to improve functional reaching Baseline: Goal status: New Pt will improve  Lt shoulder strength to at least 4+/5 MMT to improve functional strength Baseline: Goal status: New Pt will improve PSFS to at least 23/30 functional to show improved function Baseline: Goal status: New Pt will reduce pain to overall less than 3/10 with usual activity and work activity. Baseline: Goal status: New  PLAN:  PT FREQUENCY: 1-2 times per week   PT DURATION: 4-8 weeks  PLANNED INTERVENTIONS  (unless contraindicated): aquatic PT, Canalith repositioning, cryotherapy, Electrical stimulation, Iontophoresis with 4 mg/ml dexamethasome, Moist heat, traction, Ultrasound, gait training, Therapeutic exercise, balance training, neuromuscular re-education, patient/family education, manual techniques, passive ROM, dry needling, taping, vasopnuematic device, vestibular, spinal manipulations, joint manipulations 97110-Therapeutic exercises, 97530- Therapeutic activity, 97112- Neuromuscular re-education, 97535- Self Care, 40981- Manual therapy, 97016- Vasopneumatic device, 97035- Ultrasound, 19147- Ionotophoresis 4mg /ml Dexamethasone, and Dry Needling  PLAN FOR NEXT SESSION: review HEP, how was DN and repeat if helpful.     April Manson, PT, DPT 07/08/2023, 9:45 AM  Date of referral: 06/27/23 Referring provider:Brooks, Annabelle Harman, DO  Referring diagnosis? M25.512,G89.29 (ICD-10-CM) - Chronic left shoulder pain M67.814 (ICD-10-CM) - Tendinosis of left rotator cuff Treatment diagnosis? (if different than referring diagnosis) m25.512  What was this (referring dx) caused by? Ongoing Issue  Ashby Dawes of Condition: Initial Onset (within last 3 months)   Laterality: Lt  Current Functional Measure Score: Other   Patient-Specific Activity Scoring Scheme  "0" represents "unable to perform." "10" represents "able to perform at prior level. 0 1 2 3 4 5 6 7 8 9  10 (Date and Score)   Activity Eval     1. Unhooking bra 4    2. Reaching cabinets/drying hair 6    3. Making bed 7   4.    5.    Score 17/30    Total score = sum of the activity scores/number of activities Minimum detectable change (90%CI) for average score = 2 points Minimum detectable change (90%CI) for single activity score = 3 points Objective measurements identify impairments when they are compared to normal values, the uninvolved extremity, and prior level of function.  [x]  Yes  []  No  Objective assessment of functional ability:  Moderate functional limitations   Briefly describe symptoms:  pain in left shoulder with reaching up, out, or behind her limiting her functional activity  How did symptoms start: insidious onset, PT feels shoulder impingement is root cause.  Average pain intensity:  Last 24 hours: 6  Past week: 6-10  How often does the pt experience symptoms? Frequently  How much have the symptoms interfered with usual daily activities? Moderately  How has condition changed since care began at this facility? NA - initial visit  In general, how is the patients overall health? Good   BACK PAIN (STarT Back Screening Tool) No

## 2023-07-10 ENCOUNTER — Ambulatory Visit: Admitting: Physical Therapy

## 2023-07-10 ENCOUNTER — Encounter: Payer: Self-pay | Admitting: Physical Therapy

## 2023-07-10 DIAGNOSIS — R600 Localized edema: Secondary | ICD-10-CM

## 2023-07-10 DIAGNOSIS — M25512 Pain in left shoulder: Secondary | ICD-10-CM | POA: Diagnosis not present

## 2023-07-10 DIAGNOSIS — M6281 Muscle weakness (generalized): Secondary | ICD-10-CM

## 2023-07-10 DIAGNOSIS — G8929 Other chronic pain: Secondary | ICD-10-CM | POA: Diagnosis not present

## 2023-07-10 NOTE — Therapy (Signed)
 OUTPATIENT PHYSICAL THERAPY SHOULDER TREATMENT   Patient Name: Mercedes Webb MRN: [id number] DOB:04/19/1956, 68 y.o., female Today's Date: 07/10/2023  END OF SESSION:  PT End of Session - 07/10/23 1423     Visit Number 2    Number of Visits 12    Date for PT Re-Evaluation 09/02/23    Authorization Type [employer] MCR    PT Start Time 1300    PT Stop Time 1353    PT Time Calculation (min) 53 min    Activity Tolerance Patient tolerated treatment well    Behavior During Therapy [facility] for tasks assessed/performed              Past Medical History:  Diagnosis Date   Anxiety    Cancer (HCC) 2016   Skin CA squamous cell on bilateral feet removed   Cervical dysplasia    h/o   Colon polyps    polyps on first scope ~ 2006, no recurrent polyps ~ 2001 and in 2016.    Diabetes mellitus without complication (HCC)    Endometriosis    Family history of adverse reaction to anesthesia    Mother had difficulty waking & nausea   Headache    Hypertension    Insomnia    Pancreatitis 01/2016   Recurrent cold sores    Past Surgical History:  Procedure Laterality Date   ABDOMINAL HYSTERECTOMY     tah/bso- endometriosis   CHOLECYSTECTOMY     ERCP N/A 02/03/2016   Procedure: ENDOSCOPIC RETROGRADE CHOLANGIOPANCREATOGRAPHY (ERCP);  Surgeon: Jeani Hawking, MD;  Location: [facility] ENDOSCOPY;  Service: Endoscopy;  Laterality: N/A;   KNEE ARTHROSCOPY     tonsillectomy     TONSILLECTOMY     TUBAL LIGATION     Patient Active Problem List   Diagnosis Date Noted   Pain in left hip 01/04/2022   Trochanteric bursitis, left hip 11/07/2021   Microalbuminuria due to type 2 diabetes mellitus (HCC) 06/17/2019   Controlled type 2 diabetes mellitus without complication, with long-term current use of insulin (HCC) 10/23/2018   Secondary hyperhidrosis- not focal in nature but primarily from waist up is the worst 08/20/2018   Low serum progesterone 05/30/2018   Excessive sweating- likely due to hormonal  11/11/2017   Elevated serum creatinine 09/04/2017   Elevated ALT measurement 09/04/2017   Postmenopausal- 30 yrs ago TAH "yrs ago" 11/12/2016   Hormone imbalance- txed by gyn 11/12/2016   Primary insomnia 11/12/2016   Mood disorder (HCC)- mixed anxiety and depression 11/12/2016   Vitamin D deficiency 09/28/2016   Mixed diabetic hyperlipidemia associated with type 2 diabetes mellitus (HCC) 09/28/2016   Hypokalemia 02/01/2016   Hypertension associated with diabetes (HCC) 01/31/2016   Generalized anxiety disorder 01/31/2016   Surgical menopause 12/23/2014   Status post abdominal hysterectomy 12/23/2014   History of cervical dysplasia 12/23/2014   SUI (stress urinary incontinence, female) 12/23/2014   Obesity, Class I, BMI 30-34.9 12/23/2014    PCP: [name], PA   REFERRING PROVIDER: Madelyn Brunner, DO   REFERRING DIAG: [phone] (ICD-10-CM) - Chronic left shoulder pain M67.814 (ICD-10-CM) - Tendinosis of left rotator cuff  THERAPY DIAG:  Chronic left shoulder pain  Muscle weakness (generalized)  Localized edema  Rationale for Evaluation and Treatment: Rehabilitation  ONSET DATE: pain since November 2024 SUBJECTIVE:  SUBJECTIVE STATEMENT: She was very sore after DN treatment but then the next day her arm felt so much better, wishes to have this again today.  Hand dominance: Right  PERTINENT HISTORY: DM  PAIN:  NPRS scale: 5/10  Pain location:left shoulder Pain description: intermittent, sharp Aggravating factors: raising arm, reaching, making bed, unhooking bra, cleaning Relieving factors: rest, meds  PRECAUTIONS: None  RED FLAGS: None   WEIGHT BEARING RESTRICTIONS: No  FALLS:  Has patient fallen in last 6 months? No  OCCUPATION: retired  PLOF:  Independent  PATIENT GOALS:reduce pain  NEXT MD VISIT:   OBJECTIVE:  Note: Objective measures were completed at Evaluation unless otherwise noted.  DIAGNOSTIC FINDINGS:  06/28/23 MRI IMPRESSION: 1. Mild supraspinatus and subscapularis tendinosis. 2. Moderate tendinosis of the intra-articular portion of the long head of the biceps tendon  PATIENT SURVEYS:  Patient-Specific Activity Scoring Scheme  "0" represents "unable to perform." "10" represents "able to perform at prior level. 0 1 2 3 4 5 6 7 8 9  10 (Date and Score)   Activity Eval     1. Unhooking bra 4    2. Reaching cabinets/drying hair 6    3. Making bed 7   4.    5.    Score 17/30    Total score = sum of the activity scores/number of activities Minimum detectable change (90%CI) for average score = 2 points Minimum detectable change (90%CI) for single activity score = 3 points     COGNITION: Overall cognitive status: Within functional limits for tasks assessed     SENSATION: WFL   UPPER EXTREMITY ROM:   Active ROM left eval   Shoulder flexion 135   Shoulder extension    Shoulder abduction 90   Shoulder adduction    Shoulder internal rotation L3 behind back   Shoulder external rotation 60   Elbow flexion    Elbow extension    Wrist flexion    Wrist extension    Wrist ulnar deviation    Wrist radial deviation    Wrist pronation    Wrist supination    (Blank rows = not tested)  UPPER EXTREMITY MMT:  MMT Left Eval   Shoulder flexion 4   Shoulder extension 3+   Shoulder abduction 4   Shoulder adduction    Shoulder internal rotation 4+   Shoulder external rotation 3+   Middle trapezius 3+   Lower trapezius 3+   Elbow flexion    Elbow extension    Wrist flexion    Wrist extension    Wrist ulnar deviation    Wrist radial deviation    Wrist pronation    Wrist supination    Grip strength (lbs)    (Blank rows = not tested)                                                                                                                               TODAY'S TREATMENT:  Trigger Point Dry Needling (not included in billable time) Initial Treatment: Pt instructed on Dry Needling rational, procedures, and possible side effects. Pt instructed to expect mild to moderate muscle soreness later in the day and/or into the next day.  Pt instructed to continue prescribed HEP. Patient verbalized understanding of these instructions and education.  Patient Verbal Consent Given: YES Education Handout Provided: No , verbally provided Muscles Treated:Left shoulder deltoids, RTC insertion, proximal biceps, supraspinatus Electrical Stimulation Performed: YES, performed micro current and mili amp current at intensity turned up to tolerance Treatment Response/Outcome:  good overall tolerance,twitch response noted   Therex HEP review with modification of one exercise, the doorway stretch was modified to sitting at table to reduce pain with this one, she verbalizes understanding to change this up at home -Table ER stretch 10 sec X 5 -Posterior capsule stretch 10 sec X 5 -Shoulder rows red 2X10 -Shoulder extensions red 2X10 -Shoulder bilat ER red 2X10 -Shoulder bilat horizontal abd red 2X10 -Shoulder pendulums with 2# X 20 CW,CCW  modaties -Vasopnuematic device X 10 min, medium compression, 34 deg to Left shoulder  Eval HEP creation and review with demonstration and trial set preformed, see below for details Selfcare:education provided, see below for details Trigger Point Dry Needling (not included in billable time) Initial Treatment: Pt instructed on Dry Needling rational, procedures, and possible side effects. Pt instructed to expect mild to moderate muscle soreness later in the day and/or into the next day.  Pt instructed to continue prescribed HEP. Patient verbalized understanding of these instructions and education.  Patient Verbal Consent Given: YES Education Handout Provided: No ,  verbally provided Muscles Treated:Left shoulder deltoids, RTC insertion, proximal biceps, supraspinatus Electrical Stimulation Performed: No Treatment Response/Outcome:  good overall tolerance,twitch response noted   -Vasopnuematic device X 10 min, medium compression, 34 deg to Left shoulder    PATIENT EDUCATION: Education details: HEP, PT plan of care, anatomy and education on exam findings and shoulder, impingement/tendinopathy, ways to decrease inflammation, do not push into pain with exercises.  DN rationale and education Person educated: Patient Education method: Explanation, Demonstration, Verbal cues, and Handouts Education comprehension: verbalized understanding and needs further education   HOME EXERCISE PROGRAM: Access Code: [facility] URL: [email or web address] Date: 07/08/2023 Prepared by: Ivery Quale  Exercises - Standing Shoulder Posterior Capsule Stretch  - 2 x daily - 6 x weekly - 1 sets - 5 reps - 10 sec hold - Doorway Pec Stretch at 90 Degrees Abduction  - 2 x daily - 6 x weekly - 1 sets - 5 reps - 10 sec hold - Standing Shoulder Internal Rotation Stretch with Towel (Mirrored)  - 2 x daily - 6 x weekly - 1 sets - 10 reps - 5 sec hold - Standing Shoulder Row with Anchored Resistance  - 2 x daily - 6 x weekly - 2 sets - 10 reps - Shoulder Extension with Resistance - Neutral  - 2 x daily - 6 x weekly - 2 sets - 10 reps - Supine Shoulder External Rotation with Resistance  - 2 x daily - 6 x weekly - 2 sets - 10 reps - Supine Shoulder Horizontal Abduction with Resistance  - 2 x daily - 6 x weekly - 2 sets - 10 reps  ASSESSMENT:  CLINICAL IMPRESSION: She had good response from DN session but was very sore from it, so today I tried it with electrical stimulation in efforts to reduce her soreness. This was followed with stretching and strength program with good  tolerance noted.   OBJECTIVE IMPAIRMENTS: decreased activity tolerance, decreased shoulder mobility,  decreased ROM, decreased strength, impaired flexibility, impaired UE use, and pain.  ACTIVITY LIMITATIONS: reaching, lifting, carry,  cleaning,  PERSONAL FACTORS:  DM also affecting patient's functional outcome.  REHAB POTENTIAL: Good  CLINICAL DECISION MAKING: Stable/uncomplicated  EVALUATION COMPLEXITY: Low    GOALS: Short term PT Goals Target date: 08/05/2023   Pt will be I and compliant with HEP. Baseline:  Goal status: New Pt will decrease pain by 25% overall with reaching Baseline: up to 10/10 Goal status: New  Long term PT goals Target date:09/02/2023   Pt will improve Lt shoulder AROM to [facility] (>145 deg flexion and abduction, >70 ER, >T6 reaching behind back to improve functional reaching Baseline: Goal status: New Pt will improve  Lt shoulder strength to at least 4+/5 MMT to improve functional strength Baseline: Goal status: New Pt will improve PSFS to at least 23/30 functional to show improved function Baseline: Goal status: New Pt will reduce pain to overall less than 3/10 with usual activity and work activity. Baseline: Goal status: New  PLAN: PT FREQUENCY: 1-2 times per week   PT DURATION: 4-8 weeks  PLANNED INTERVENTIONS (unless contraindicated): aquatic PT, Canalith repositioning, cryotherapy, Electrical stimulation, Iontophoresis with 4 mg/ml dexamethasome, Moist heat, traction, Ultrasound, gait training, Therapeutic exercise, balance training, neuromuscular re-education, patient/family education, manual techniques, passive ROM, dry needling, taping, vasopnuematic device, vestibular, spinal manipulations, joint manipulations 97110-Therapeutic exercises, 97530- Therapeutic activity, [id number]- Neuromuscular re-education, 97535- Self Care, 16109- Manual therapy, 97016- Vasopneumatic device, 97035- Ultrasound, 60454- Ionotophoresis 4mg /ml Dexamethasone, and Dry Needling  PLAN FOR NEXT SESSION:  how was DN with estim last time? Progress as toelrated.    April Manson, PT, DPT 07/10/2023, 2:24 PM  Date of referral: 06/27/23 Referring provider:Brooks, Annabelle Harman, DO  Referring diagnosis? M25.512,G89.29 (ICD-10-CM) - Chronic left shoulder pain M67.814 (ICD-10-CM) - Tendinosis of left rotator cuff Treatment diagnosis? (if different than referring diagnosis) m25.512  What was this (referring dx) caused by? Ongoing Issue  Ashby Dawes of Condition: Initial Onset (within last 3 months)   Laterality: Lt  Current Functional Measure Score: Other   Patient-Specific Activity Scoring Scheme  "0" represents "unable to perform." "10" represents "able to perform at prior level. 0 1 2 3 4 5 6 7 8 9  10 (Date and Score)   Activity Eval     1. Unhooking bra 4    2. Reaching cabinets/drying hair 6    3. Making bed 7   4.    5.    Score 17/30    Total score = sum of the activity scores/number of activities Minimum detectable change (90%CI) for average score = 2 points Minimum detectable change (90%CI) for single activity score = 3 points Objective measurements identify impairments when they are compared to normal values, the uninvolved extremity, and prior level of function.  [x]  Yes  []  No  Objective assessment of functional ability: Moderate functional limitations   Briefly describe symptoms:  pain in left shoulder with reaching up, out, or behind her limiting her functional activity  How did symptoms start: insidious onset, PT feels shoulder impingement is root cause.  Average pain intensity:  Last 24 hours: 6  Past week: 6-10  How often does the pt experience symptoms? Frequently  How much have the symptoms interfered with usual daily activities? Moderately  How has condition changed since care began at this facility? NA - initial visit  In general, how is the patients overall  health? Good   BACK PAIN (STarT Back Screening Tool) No

## 2023-07-12 ENCOUNTER — Other Ambulatory Visit: Payer: Medicare Other

## 2023-07-16 ENCOUNTER — Ambulatory Visit: Admitting: Physical Therapy

## 2023-07-16 ENCOUNTER — Encounter: Payer: Self-pay | Admitting: Physical Therapy

## 2023-07-16 DIAGNOSIS — G8929 Other chronic pain: Secondary | ICD-10-CM | POA: Diagnosis not present

## 2023-07-16 DIAGNOSIS — M6281 Muscle weakness (generalized): Secondary | ICD-10-CM

## 2023-07-16 DIAGNOSIS — M25512 Pain in left shoulder: Secondary | ICD-10-CM

## 2023-07-16 DIAGNOSIS — R600 Localized edema: Secondary | ICD-10-CM

## 2023-07-16 NOTE — Therapy (Signed)
 OUTPATIENT PHYSICAL THERAPY SHOULDER TREATMENT   Patient Name: Mercedes Webb MRN: [id number] DOB:11-25-1955, 68 y.o., female Today's Date: 07/16/2023  END OF SESSION:  PT End of Session - 07/16/23 0918     Visit Number 3    Number of Visits 12    Date for PT Re-Evaluation 09/02/23    Authorization Type [employer] MCR    PT Start Time 0843    PT Stop Time 0928    PT Time Calculation (min) 45 min    Activity Tolerance Patient tolerated treatment well    Behavior During Therapy [facility] for tasks assessed/performed               Past Medical History:  Diagnosis Date   Anxiety    Cancer (HCC) 2016   Skin CA squamous cell on bilateral feet removed   Cervical dysplasia    h/o   Colon polyps    polyps on first scope ~ 2006, no recurrent polyps ~ 2001 and in 2016.    Diabetes mellitus without complication (HCC)    Endometriosis    Family history of adverse reaction to anesthesia    Mother had difficulty waking & nausea   Headache    Hypertension    Insomnia    Pancreatitis 01/2016   Recurrent cold sores    Past Surgical History:  Procedure Laterality Date   ABDOMINAL HYSTERECTOMY     tah/bso- endometriosis   CHOLECYSTECTOMY     ERCP N/A 02/03/2016   Procedure: ENDOSCOPIC RETROGRADE CHOLANGIOPANCREATOGRAPHY (ERCP);  Surgeon: Jeani Hawking, MD;  Location: [facility] ENDOSCOPY;  Service: Endoscopy;  Laterality: N/A;   KNEE ARTHROSCOPY     tonsillectomy     TONSILLECTOMY     TUBAL LIGATION     Patient Active Problem List   Diagnosis Date Noted   Pain in left hip 01/04/2022   Trochanteric bursitis, left hip 11/07/2021   Microalbuminuria due to type 2 diabetes mellitus (HCC) 06/17/2019   Controlled type 2 diabetes mellitus without complication, with long-term current use of insulin (HCC) 10/23/2018   Secondary hyperhidrosis- not focal in nature but primarily from waist up is the worst 08/20/2018   Low serum progesterone 05/30/2018   Excessive sweating- likely due to hormonal  11/11/2017   Elevated serum creatinine 09/04/2017   Elevated ALT measurement 09/04/2017   Postmenopausal- 30 yrs ago TAH "yrs ago" 11/12/2016   Hormone imbalance- txed by gyn 11/12/2016   Primary insomnia 11/12/2016   Mood disorder (HCC)- mixed anxiety and depression 11/12/2016   Vitamin D deficiency 09/28/2016   Mixed diabetic hyperlipidemia associated with type 2 diabetes mellitus (HCC) 09/28/2016   Hypokalemia 02/01/2016   Hypertension associated with diabetes (HCC) 01/31/2016   Generalized anxiety disorder 01/31/2016   Surgical menopause 12/23/2014   Status post abdominal hysterectomy 12/23/2014   History of cervical dysplasia 12/23/2014   SUI (stress urinary incontinence, female) 12/23/2014   Obesity, Class I, BMI 30-34.9 12/23/2014    PCP: [name], PA   REFERRING PROVIDER: Madelyn Brunner, DO   REFERRING DIAG: [phone] (ICD-10-CM) - Chronic left shoulder pain M67.814 (ICD-10-CM) - Tendinosis of left rotator cuff  THERAPY DIAG:  Chronic left shoulder pain  Muscle weakness (generalized)  Localized edema  Rationale for Evaluation and Treatment: Rehabilitation  ONSET DATE: pain since November 2024 SUBJECTIVE:  SUBJECTIVE STATEMENT: She was not as sore after the DN with Estim, however also felt like it was not as effective so does elect to have it without Estim today.   Hand dominance: Right  PERTINENT HISTORY: DM  PAIN:  NPRS scale: 6/10  Pain location:left shoulder Pain description: intermittent, sharp Aggravating factors: raising arm, reaching, making bed, unhooking bra, cleaning Relieving factors: rest, meds  PRECAUTIONS: None  RED FLAGS: None   WEIGHT BEARING RESTRICTIONS: No  FALLS:  Has patient fallen in last 6 months? No  OCCUPATION: retired  PLOF:  Independent  PATIENT GOALS:reduce pain  NEXT MD VISIT:   OBJECTIVE:  Note: Objective measures were completed at Evaluation unless otherwise noted.  DIAGNOSTIC FINDINGS:  06/28/23 MRI IMPRESSION: 1. Mild supraspinatus and subscapularis tendinosis. 2. Moderate tendinosis of the intra-articular portion of the long head of the biceps tendon  PATIENT SURVEYS:  Patient-Specific Activity Scoring Scheme  "0" represents "unable to perform." "10" represents "able to perform at prior level. 0 1 2 3 4 5 6 7 8 9  10 (Date and Score)   Activity Eval     1. Unhooking bra 4    2. Reaching cabinets/drying hair 6    3. Making bed 7   4.    5.    Score 17/30    Total score = sum of the activity scores/number of activities Minimum detectable change (90%CI) for average score = 2 points Minimum detectable change (90%CI) for single activity score = 3 points     COGNITION: Overall cognitive status: Within functional limits for tasks assessed     SENSATION: [facility]   UPPER EXTREMITY ROM:   Active ROM left eval Left 07/16/23  Shoulder flexion 135 150  Shoulder extension    Shoulder abduction 90 90  Shoulder adduction    Shoulder internal rotation L3 behind back L2 behind back  Shoulder external rotation 60 80  Elbow flexion    Elbow extension    Wrist flexion    Wrist extension    Wrist ulnar deviation    Wrist radial deviation    Wrist pronation    Wrist supination    (Blank rows = not tested)  UPPER EXTREMITY MMT:  MMT Left Eval   Shoulder flexion 4   Shoulder extension 3+   Shoulder abduction 4   Shoulder adduction    Shoulder internal rotation 4+   Shoulder external rotation 3+   Middle trapezius 3+   Lower trapezius 3+   Elbow flexion    Elbow extension    Wrist flexion    Wrist extension    Wrist ulnar deviation    Wrist radial deviation    Wrist pronation    Wrist supination    Grip strength (lbs)    (Blank rows = not tested)  TODAY'S TREATMENT:  07/16/23 Trigger Point Dry Needling (5 min and not included in billable time) Subsequent Treatment: education previously provided Pt instructed to continue prescribed HEP. Patient Verbal Consent Given: YES Education Handout Provided: yes Muscles Treated:Left shoulder deltoids, RTC insertion, proximal biceps, supraspinatus Electrical Stimulation Performed: NO Treatment Response/Outcome:  good overall tolerance,twitch response noted   Therex -Table ER stretch 10 sec X 5 -Table flexion stretch 10 sec X 5 -Posterior capsule stretch 10 sec X 5 -Bent over shoulder scaption "Y"  X10 -Bent over horizontal abduction "T" X 10 -Shoulder rows red 2X15 -Shoulder extensions red 2X15 -Shoulder bilat ER red 2X10 -Shoulder bilat horizontal abd red 2X10 -Shoulder pendulums with X 20 CW,CCW  modaties -Vasopnuematic device X 10 min, medium compression, 34 deg to Left shoulder  07/08/23 Trigger Point Dry Needling (not included in billable time) Initial Treatment: Pt instructed on Dry Needling rational, procedures, and possible side effects. Pt instructed to expect mild to moderate muscle soreness later in the day and/or into the next day.  Pt instructed to continue prescribed HEP. Patient verbalized understanding of these instructions and education.  Patient Verbal Consent Given: YES Education Handout Provided: No , verbally provided Muscles Treated:Left shoulder deltoids, RTC insertion, proximal biceps, supraspinatus Electrical Stimulation Performed: YES, performed micro current and mili amp current at intensity turned up to tolerance Treatment Response/Outcome:  good overall tolerance,twitch response noted   Therex HEP review with modification of one exercise, the doorway stretch was modified to sitting at table to reduce pain with this one, she verbalizes understanding to change  this up at home -Table ER stretch 10 sec X 5 -Posterior capsule stretch 10 sec X 5 -Shoulder rows red 2X10 -Shoulder extensions red 2X10 -Shoulder bilat ER red 2X10 -Shoulder bilat horizontal abd red 2X10 -Shoulder pendulums with 2# X 20 CW,CCW  modaties -Vasopnuematic device X 10 min, medium compression, 34 deg to Left shoulder   PATIENT EDUCATION: Education details: HEP, PT plan of care, anatomy and education on exam findings and shoulder, impingement/tendinopathy, ways to decrease inflammation, do not push into pain with exercises.  DN rationale and education Person educated: Patient Education method: Explanation, Demonstration, Verbal cues, and Handouts Education comprehension: verbalized understanding and needs further education   HOME EXERCISE PROGRAM: Access Code: [facility] URL: [email or web address] Date: 07/08/2023 Prepared by: Ivery Quale  Exercises - Standing Shoulder Posterior Capsule Stretch  - 2 x daily - 6 x weekly - 1 sets - 5 reps - 10 sec hold - Doorway Pec Stretch at 90 Degrees Abduction  - 2 x daily - 6 x weekly - 1 sets - 5 reps - 10 sec hold - Standing Shoulder Internal Rotation Stretch with Towel (Mirrored)  - 2 x daily - 6 x weekly - 1 sets - 10 reps - 5 sec hold - Standing Shoulder Row with Anchored Resistance  - 2 x daily - 6 x weekly - 2 sets - 10 reps - Shoulder Extension with Resistance - Neutral  - 2 x daily - 6 x weekly - 2 sets - 10 reps - Supine Shoulder External Rotation with Resistance  - 2 x daily - 6 x weekly - 2 sets - 10 reps - Supine Shoulder Horizontal Abduction with Resistance  - 2 x daily - 6 x weekly - 2 sets - 10 reps  ASSESSMENT:  CLINICAL IMPRESSION: She felt the DN with the Estim was not quite as effective so she did opt for DN without the Estim using poisoning technique. I then progressed  stretching and strength work in session today with good tolerance noted. ROM is improving but still with pain so PT recommending to  continue with current plan. She does report she feels about 60% better overall since starting PT.  OBJECTIVE IMPAIRMENTS: decreased activity tolerance, decreased shoulder mobility, decreased ROM, decreased strength, impaired flexibility, impaired UE use, and pain.  ACTIVITY LIMITATIONS: reaching, lifting, carry,  cleaning,  PERSONAL FACTORS:  DM also affecting patient's functional outcome.  REHAB POTENTIAL: Good  CLINICAL DECISION MAKING: Stable/uncomplicated  EVALUATION COMPLEXITY: Low    GOALS: Short term PT Goals Target date: 08/05/2023   Pt will be I and compliant with HEP. Baseline:  Goal status: New Pt will decrease pain by 25% overall with reaching Baseline: up to 10/10 Goal status: New  Long term PT goals Target date:09/02/2023   Pt will improve Lt shoulder AROM to [facility] (>145 deg flexion and abduction, >70 ER, >T6 reaching behind back to improve functional reaching Baseline: Goal status: New Pt will improve  Lt shoulder strength to at least 4+/5 MMT to improve functional strength Baseline: Goal status: New Pt will improve PSFS to at least 23/30 functional to show improved function Baseline: Goal status: New Pt will reduce pain to overall less than 3/10 with usual activity and work activity. Baseline: Goal status: New  PLAN: PT FREQUENCY: 1-2 times per week   PT DURATION: 4-8 weeks  PLANNED INTERVENTIONS (unless contraindicated): aquatic PT, Canalith repositioning, cryotherapy, Electrical stimulation, Iontophoresis with 4 mg/ml dexamethasome, Moist heat, traction, Ultrasound, gait training, Therapeutic exercise, balance training, neuromuscular re-education, patient/family education, manual techniques, passive ROM, dry needling, taping, vasopnuematic device, vestibular, spinal manipulations, joint manipulations 97110-Therapeutic exercises, 97530- Therapeutic activity, [id number]- Neuromuscular re-education, 97535- Self Care, 16109- Manual therapy, 97016- Vasopneumatic  device, 97035- Ultrasound, 60454- Ionotophoresis 4mg /ml Dexamethasone, and Dry Needling  PLAN FOR NEXT SESSION:  how was DN with estim last time? Progress as toelrated.    April Manson, PT, DPT 07/16/2023, 9:18 AM  Date of referral: 06/27/23 Referring provider:Brooks, Annabelle Harman, DO  Referring diagnosis? M25.512,G89.29 (ICD-10-CM) - Chronic left shoulder pain M67.814 (ICD-10-CM) - Tendinosis of left rotator cuff Treatment diagnosis? (if different than referring diagnosis) m25.512  What was this (referring dx) caused by? Ongoing Issue  Ashby Dawes of Condition: Initial Onset (within last 3 months)   Laterality: Lt  Current Functional Measure Score: Other   Patient-Specific Activity Scoring Scheme  "0" represents "unable to perform." "10" represents "able to perform at prior level. 0 1 2 3 4 5 6 7 8 9  10 (Date and Score)   Activity Eval     1. Unhooking bra 4    2. Reaching cabinets/drying hair 6    3. Making bed 7   4.    5.    Score 17/30    Total score = sum of the activity scores/number of activities Minimum detectable change (90%CI) for average score = 2 points Minimum detectable change (90%CI) for single activity score = 3 points Objective measurements identify impairments when they are compared to normal values, the uninvolved extremity, and prior level of function.  [x]  Yes  []  No  Objective assessment of functional ability: Moderate functional limitations   Briefly describe symptoms:  pain in left shoulder with reaching up, out, or behind her limiting her functional activity  How did symptoms start: insidious onset, PT feels shoulder impingement is root cause.  Average pain intensity:  Last 24 hours: 6  Past week: 6-10  How often does the pt experience symptoms? Frequently  How much have the symptoms interfered with usual daily activities? Moderately  How has condition changed since care began at this facility? NA - initial visit  In general, how is the  patients overall health? Good   BACK PAIN (STarT Back Screening Tool) No

## 2023-07-16 NOTE — Patient Instructions (Signed)

## 2023-07-17 ENCOUNTER — Ambulatory Visit: Payer: Medicare Other | Admitting: Family Medicine

## 2023-07-18 ENCOUNTER — Ambulatory Visit: Admitting: Physical Therapy

## 2023-07-18 ENCOUNTER — Encounter: Payer: Self-pay | Admitting: Physical Therapy

## 2023-07-18 DIAGNOSIS — G8929 Other chronic pain: Secondary | ICD-10-CM

## 2023-07-18 DIAGNOSIS — R600 Localized edema: Secondary | ICD-10-CM

## 2023-07-18 DIAGNOSIS — M25512 Pain in left shoulder: Secondary | ICD-10-CM | POA: Diagnosis not present

## 2023-07-18 DIAGNOSIS — M6281 Muscle weakness (generalized): Secondary | ICD-10-CM | POA: Diagnosis not present

## 2023-07-18 NOTE — Therapy (Signed)
 OUTPATIENT PHYSICAL THERAPY SHOULDER TREATMENT   Patient Name: Mercedes Webb MRN: [id number] DOB:May 01, 1955, 68 y.o., female Today's Date: 07/18/2023  END OF SESSION:  PT End of Session - 07/18/23 1412     Visit Number 4    Number of Visits 12    Date for PT Re-Evaluation 09/02/23    Authorization Type [employer] MCR    PT Start Time 1415    PT Stop Time 1455    PT Time Calculation (min) 40 min    Activity Tolerance Patient tolerated treatment well    Behavior During Therapy [facility] for tasks assessed/performed                Past Medical History:  Diagnosis Date   Anxiety    Cancer (HCC) 2016   Skin CA squamous cell on bilateral feet removed   Cervical dysplasia    h/o   Colon polyps    polyps on first scope ~ 2006, no recurrent polyps ~ 2001 and in 2016.    Diabetes mellitus without complication (HCC)    Endometriosis    Family history of adverse reaction to anesthesia    Mother had difficulty waking & nausea   Headache    Hypertension    Insomnia    Pancreatitis 01/2016   Recurrent cold sores    Past Surgical History:  Procedure Laterality Date   ABDOMINAL HYSTERECTOMY     tah/bso- endometriosis   CHOLECYSTECTOMY     ERCP N/A 02/03/2016   Procedure: ENDOSCOPIC RETROGRADE CHOLANGIOPANCREATOGRAPHY (ERCP);  Surgeon: Jeani Hawking, MD;  Location: [facility] ENDOSCOPY;  Service: Endoscopy;  Laterality: N/A;   KNEE ARTHROSCOPY     tonsillectomy     TONSILLECTOMY     TUBAL LIGATION     Patient Active Problem List   Diagnosis Date Noted   Pain in left hip 01/04/2022   Trochanteric bursitis, left hip 11/07/2021   Microalbuminuria due to type 2 diabetes mellitus (HCC) 06/17/2019   Controlled type 2 diabetes mellitus without complication, with long-term current use of insulin (HCC) 10/23/2018   Secondary hyperhidrosis- not focal in nature but primarily from waist up is the worst 08/20/2018   Low serum progesterone 05/30/2018   Excessive sweating- likely due to hormonal  11/11/2017   Elevated serum creatinine 09/04/2017   Elevated ALT measurement 09/04/2017   Postmenopausal- 30 yrs ago TAH "yrs ago" 11/12/2016   Hormone imbalance- txed by gyn 11/12/2016   Primary insomnia 11/12/2016   Mood disorder (HCC)- mixed anxiety and depression 11/12/2016   Vitamin D deficiency 09/28/2016   Mixed diabetic hyperlipidemia associated with type 2 diabetes mellitus (HCC) 09/28/2016   Hypokalemia 02/01/2016   Hypertension associated with diabetes (HCC) 01/31/2016   Generalized anxiety disorder 01/31/2016   Surgical menopause 12/23/2014   Status post abdominal hysterectomy 12/23/2014   History of cervical dysplasia 12/23/2014   SUI (stress urinary incontinence, female) 12/23/2014   Obesity, Class I, BMI 30-34.9 12/23/2014    PCP: [name], PA   REFERRING PROVIDER: Madelyn Brunner, DO   REFERRING DIAG: [phone] (ICD-10-CM) - Chronic left shoulder pain M67.814 (ICD-10-CM) - Tendinosis of left rotator cuff  THERAPY DIAG:  Chronic left shoulder pain  Muscle weakness (generalized)  Localized edema  Rationale for Evaluation and Treatment: Rehabilitation  ONSET DATE: pain since November 2024 SUBJECTIVE:  SUBJECTIVE STATEMENT: No pain out of the ordinary; DN has been helpful  Hand dominance: Right  PERTINENT HISTORY: DM  PAIN:  NPRS scale: 6 currently/10  Pain location:left shoulder Pain description: intermittent, sharp Aggravating factors: raising arm, reaching, making bed, unhooking bra, cleaning Relieving factors: rest, meds  PRECAUTIONS: None  RED FLAGS: None   WEIGHT BEARING RESTRICTIONS: No  FALLS:  Has patient fallen in last 6 months? No  OCCUPATION: retired  PLOF: Independent  PATIENT GOALS:reduce pain  NEXT MD VISIT:   OBJECTIVE:  Note:  Objective measures were completed at Evaluation unless otherwise noted.  DIAGNOSTIC FINDINGS:  06/28/23 MRI IMPRESSION: 1. Mild supraspinatus and subscapularis tendinosis. 2. Moderate tendinosis of the intra-articular portion of the long head of the biceps tendon  PATIENT SURVEYS:  Patient-Specific Activity Scoring Scheme  "0" represents "unable to perform." "10" represents "able to perform at prior level. 0 1 2 3 4 5 6 7 8 9  10 (Date and Score)   Activity Eval     1. Unhooking bra 4    2. Reaching cabinets/drying hair 6    3. Making bed 7   4.    5.    Score 17/30    Total score = sum of the activity scores/number of activities Minimum detectable change (90%CI) for average score = 2 points Minimum detectable change (90%CI) for single activity score = 3 points     COGNITION: Overall cognitive status: Within functional limits for tasks assessed     SENSATION: [facility]   UPPER EXTREMITY ROM:   Active ROM left eval Left 07/16/23  Shoulder flexion 135 150  Shoulder extension    Shoulder abduction 90 90  Shoulder adduction    Shoulder internal rotation L3 behind back L2 behind back  Shoulder external rotation 60 80  Elbow flexion    Elbow extension    Wrist flexion    Wrist extension    Wrist ulnar deviation    Wrist radial deviation    Wrist pronation    Wrist supination    (Blank rows = not tested)  UPPER EXTREMITY MMT:  MMT Left Eval   Shoulder flexion 4   Shoulder extension 3+   Shoulder abduction 4   Shoulder adduction    Shoulder internal rotation 4+   Shoulder external rotation 3+   Middle trapezius 3+   Lower trapezius 3+   Elbow flexion    Elbow extension    Wrist flexion    Wrist extension    Wrist ulnar deviation    Wrist radial deviation    Wrist pronation    Wrist supination    Grip strength (lbs)    (Blank rows = not tested)                                                                                                                               TODAY'S TREATMENT:  07/18/23 TherEx UBE L2 x 4 min (2 min each  direction) Rows L3 band 2x10 IR/ER L3 band 2x10 on Lt Standing shoulder extension stretch 5x10 sec hold on Lt  Manual STM with compression to Lt anterior deltoid; skilled palpation and monitoring of soft tissue during DN IASTM with percussive device to Lt anterior and middle deltoid  Trigger Point Dry Needling  Subsequent Treatment: Instructions provided previously at initial dry needling treatment.   Patient Verbal Consent Given: Yes Education Handout Provided: Previously Provided Muscles Treated: Lt anterior deltoid Electrical Stimulation Performed: No Treatment Response/Outcome: twitch responses noted     07/16/23 Trigger Point Dry Needling (5 min and not included in billable time) Subsequent Treatment: education previously provided Pt instructed to continue prescribed HEP. Patient Verbal Consent Given: YES Education Handout Provided: yes Muscles Treated:Left shoulder deltoids, RTC insertion, proximal biceps, supraspinatus Electrical Stimulation Performed: NO Treatment Response/Outcome:  good overall tolerance,twitch response noted   Therex -Table ER stretch 10 sec X 5 -Table flexion stretch 10 sec X 5 -Posterior capsule stretch 10 sec X 5 -Bent over shoulder scaption "Y"  X10 -Bent over horizontal abduction "T" X 10 -Shoulder rows red 2X15 -Shoulder extensions red 2X15 -Shoulder bilat ER red 2X10 -Shoulder bilat horizontal abd red 2X10 -Shoulder pendulums with X 20 CW,CCW  modaties -Vasopnuematic device X 10 min, medium compression, 34 deg to Left shoulder  07/08/23 Trigger Point Dry Needling (not included in billable time) Initial Treatment: Pt instructed on Dry Needling rational, procedures, and possible side effects. Pt instructed to expect mild to moderate muscle soreness later in the day and/or into the next day.  Pt instructed to continue prescribed HEP. Patient verbalized  understanding of these instructions and education.  Patient Verbal Consent Given: YES Education Handout Provided: No , verbally provided Muscles Treated:Left shoulder deltoids, RTC insertion, proximal biceps, supraspinatus Electrical Stimulation Performed: YES, performed micro current and mili amp current at intensity turned up to tolerance Treatment Response/Outcome:  good overall tolerance,twitch response noted   Therex HEP review with modification of one exercise, the doorway stretch was modified to sitting at table to reduce pain with this one, she verbalizes understanding to change this up at home -Table ER stretch 10 sec X 5 -Posterior capsule stretch 10 sec X 5 -Shoulder rows red 2X10 -Shoulder extensions red 2X10 -Shoulder bilat ER red 2X10 -Shoulder bilat horizontal abd red 2X10 -Shoulder pendulums with 2# X 20 CW,CCW  modaties -Vasopnuematic device X 10 min, medium compression, 34 deg to Left shoulder   PATIENT EDUCATION: Education details: HEP, PT plan of care, anatomy and education on exam findings and shoulder, impingement/tendinopathy, ways to decrease inflammation, do not push into pain with exercises.  DN rationale and education Person educated: Patient Education method: Explanation, Demonstration, Verbal cues, and Handouts Education comprehension: verbalized understanding and needs further education   HOME EXERCISE PROGRAM: Access Code: [facility] URL: [email or web address] Date: 07/08/2023 Prepared by: Ivery Quale  Exercises - Standing Shoulder Posterior Capsule Stretch  - 2 x daily - 6 x weekly - 1 sets - 5 reps - 10 sec hold - Doorway Pec Stretch at 90 Degrees Abduction  - 2 x daily - 6 x weekly - 1 sets - 5 reps - 10 sec hold - Standing Shoulder Internal Rotation Stretch with Towel (Mirrored)  - 2 x daily - 6 x weekly - 1 sets - 10 reps - 5 sec hold - Standing Shoulder Row with Anchored Resistance  - 2 x daily - 6 x weekly - 2 sets - 10 reps -  Shoulder Extension with Resistance -  Neutral  - 2 x daily - 6 x weekly - 2 sets - 10 reps - Supine Shoulder External Rotation with Resistance  - 2 x daily - 6 x weekly - 2 sets - 10 reps - Supine Shoulder Horizontal Abduction with Resistance  - 2 x daily - 6 x weekly - 2 sets - 10 reps  ASSESSMENT:  CLINICAL IMPRESSION:  Pt tolerated session well today with continued DN to anterior deltoid today.  Able to tolerate progression of exercises well today.  Continue skilled PT.  OBJECTIVE IMPAIRMENTS: decreased activity tolerance, decreased shoulder mobility, decreased ROM, decreased strength, impaired flexibility, impaired UE use, and pain.  ACTIVITY LIMITATIONS: reaching, lifting, carry,  cleaning,  PERSONAL FACTORS:  DM also affecting patient's functional outcome.  REHAB POTENTIAL: Good  CLINICAL DECISION MAKING: Stable/uncomplicated  EVALUATION COMPLEXITY: Low    GOALS: Short term PT Goals Target date: 08/05/2023   Pt will be I and compliant with HEP. Baseline:  Goal status: New Pt will decrease pain by 25% overall with reaching Baseline: up to 10/10 Goal status: New  Long term PT goals Target date:09/02/2023   Pt will improve Lt shoulder AROM to [facility] (>145 deg flexion and abduction, >70 ER, >T6 reaching behind back to improve functional reaching Baseline: Goal status: New Pt will improve  Lt shoulder strength to at least 4+/5 MMT to improve functional strength Baseline: Goal status: New Pt will improve PSFS to at least 23/30 functional to show improved function Baseline: Goal status: New Pt will reduce pain to overall less than 3/10 with usual activity and work activity. Baseline: Goal status: New  PLAN: PT FREQUENCY: 1-2 times per week   PT DURATION: 4-8 weeks  PLANNED INTERVENTIONS (unless contraindicated): aquatic PT, Canalith repositioning, cryotherapy, Electrical stimulation, Iontophoresis with 4 mg/ml dexamethasome, Moist heat, traction, Ultrasound, gait  training, Therapeutic exercise, balance training, neuromuscular re-education, patient/family education, manual techniques, passive ROM, dry needling, taping, vasopnuematic device, vestibular, spinal manipulations, joint manipulations 97110-Therapeutic exercises, 97530- Therapeutic activity, 97112- Neuromuscular re-education, 97535- Self Care, [id number]- Manual therapy, 97016- Vasopneumatic device, [id number]- Ultrasound, 03474- Ionotophoresis 4mg /ml Dexamethasone, and Dry Needling  PLAN FOR NEXT SESSION:  continue DN PRN, shoulder strengthening,  Progress as tolerated.    Clarita Crane, PT, DPT 07/18/23 3:02 PM   Date of referral: 06/27/23 Referring provider:Brooks, Annabelle Harman, DO  Referring diagnosis? M25.512,G89.29 (ICD-10-CM) - Chronic left shoulder pain M67.814 (ICD-10-CM) - Tendinosis of left rotator cuff Treatment diagnosis? (if different than referring diagnosis) m25.512  What was this (referring dx) caused by? Ongoing Issue  Ashby Dawes of Condition: Initial Onset (within last 3 months)   Laterality: Lt  Current Functional Measure Score: Other   Patient-Specific Activity Scoring Scheme  "0" represents "unable to perform." "10" represents "able to perform at prior level. 0 1 2 3 4 5 6 7 8 9  10 (Date and Score)   Activity Eval     1. Unhooking bra 4    2. Reaching cabinets/drying hair 6    3. Making bed 7   4.    5.    Score 17/30    Total score = sum of the activity scores/number of activities Minimum detectable change (90%CI) for average score = 2 points Minimum detectable change (90%CI) for single activity score = 3 points Objective measurements identify impairments when they are compared to normal values, the uninvolved extremity, and prior level of function.  [x]  Yes  []  No  Objective assessment of functional ability: Moderate functional limitations   Briefly describe symptoms:  pain in left shoulder with reaching up, out, or behind her limiting her functional  activity  How did symptoms start: insidious onset, PT feels shoulder impingement is root cause.  Average pain intensity:  Last 24 hours: 6  Past week: 6-10  How often does the pt experience symptoms? Frequently  How much have the symptoms interfered with usual daily activities? Moderately  How has condition changed since care began at this facility? NA - initial visit  In general, how is the patients overall health? Good   BACK PAIN (STarT Back Screening Tool) No

## 2023-07-19 ENCOUNTER — Other Ambulatory Visit: Payer: Self-pay | Admitting: Family Medicine

## 2023-07-19 DIAGNOSIS — E1169 Type 2 diabetes mellitus with other specified complication: Secondary | ICD-10-CM

## 2023-07-22 ENCOUNTER — Ambulatory Visit: Admitting: Physical Therapy

## 2023-07-22 ENCOUNTER — Encounter: Payer: Self-pay | Admitting: Physical Therapy

## 2023-07-22 DIAGNOSIS — G8929 Other chronic pain: Secondary | ICD-10-CM | POA: Diagnosis not present

## 2023-07-22 DIAGNOSIS — R600 Localized edema: Secondary | ICD-10-CM

## 2023-07-22 DIAGNOSIS — M25512 Pain in left shoulder: Secondary | ICD-10-CM | POA: Diagnosis not present

## 2023-07-22 DIAGNOSIS — M6281 Muscle weakness (generalized): Secondary | ICD-10-CM | POA: Diagnosis not present

## 2023-07-22 NOTE — Therapy (Signed)
 OUTPATIENT PHYSICAL THERAPY SHOULDER TREATMENT   Patient Name: Mercedes Webb MRN: [id number] DOB:Mar 14, 1956, 68 y.o., female Today's Date: 07/22/2023  END OF SESSION:  PT End of Session - 07/22/23 0843     Visit Number 5    Number of Visits 12    Date for PT Re-Evaluation 09/02/23    Authorization Type [employer] MCR    PT Start Time 0845    PT Stop Time 0928    PT Time Calculation (min) 43 min    Activity Tolerance Patient tolerated treatment well    Behavior During Therapy [facility] for tasks assessed/performed                Past Medical History:  Diagnosis Date   Anxiety    Cancer (HCC) 2016   Skin CA squamous cell on bilateral feet removed   Cervical dysplasia    h/o   Colon polyps    polyps on first scope ~ 2006, no recurrent polyps ~ 2001 and in 2016.    Diabetes mellitus without complication (HCC)    Endometriosis    Family history of adverse reaction to anesthesia    Mother had difficulty waking & nausea   Headache    Hypertension    Insomnia    Pancreatitis 01/2016   Recurrent cold sores    Past Surgical History:  Procedure Laterality Date   ABDOMINAL HYSTERECTOMY     tah/bso- endometriosis   CHOLECYSTECTOMY     ERCP N/A 02/03/2016   Procedure: ENDOSCOPIC RETROGRADE CHOLANGIOPANCREATOGRAPHY (ERCP);  Surgeon: Jeani Hawking, MD;  Location: [facility] ENDOSCOPY;  Service: Endoscopy;  Laterality: N/A;   KNEE ARTHROSCOPY     tonsillectomy     TONSILLECTOMY     TUBAL LIGATION     Patient Active Problem List   Diagnosis Date Noted   Pain in left hip 01/04/2022   Trochanteric bursitis, left hip 11/07/2021   Microalbuminuria due to type 2 diabetes mellitus (HCC) 06/17/2019   Controlled type 2 diabetes mellitus without complication, with long-term current use of insulin (HCC) 10/23/2018   Secondary hyperhidrosis- not focal in nature but primarily from waist up is the worst 08/20/2018   Low serum progesterone 05/30/2018   Excessive sweating- likely due to hormonal  11/11/2017   Elevated serum creatinine 09/04/2017   Elevated ALT measurement 09/04/2017   Postmenopausal- 30 yrs ago TAH "yrs ago" 11/12/2016   Hormone imbalance- txed by gyn 11/12/2016   Primary insomnia 11/12/2016   Mood disorder (HCC)- mixed anxiety and depression 11/12/2016   Vitamin D deficiency 09/28/2016   Mixed diabetic hyperlipidemia associated with type 2 diabetes mellitus (HCC) 09/28/2016   Hypokalemia 02/01/2016   Hypertension associated with diabetes (HCC) 01/31/2016   Generalized anxiety disorder 01/31/2016   Surgical menopause 12/23/2014   Status post abdominal hysterectomy 12/23/2014   History of cervical dysplasia 12/23/2014   SUI (stress urinary incontinence, female) 12/23/2014   Obesity, Class I, BMI 30-34.9 12/23/2014    PCP: [name], PA   REFERRING PROVIDER: Madelyn Brunner, DO   REFERRING DIAG: [phone] (ICD-10-CM) - Chronic left shoulder pain M67.814 (ICD-10-CM) - Tendinosis of left rotator cuff  THERAPY DIAG:  Chronic left shoulder pain  Muscle weakness (generalized)  Localized edema  Rationale for Evaluation and Treatment: Rehabilitation  ONSET DATE: pain since November 2024 SUBJECTIVE:  SUBJECTIVE STATEMENT: Relays the DN and PT are helping the pain  Hand dominance: Right  PERTINENT HISTORY: DM  PAIN:  NPRS scale: 5 currently/10  Pain location:left shoulder Pain description: intermittent, sharp Aggravating factors: raising arm, reaching, making bed, unhooking bra, cleaning Relieving factors: rest, meds  PRECAUTIONS: None  RED FLAGS: None   WEIGHT BEARING RESTRICTIONS: No  FALLS:  Has patient fallen in last 6 months? No  OCCUPATION: retired  PLOF: Independent  PATIENT GOALS:reduce pain  NEXT MD VISIT:   OBJECTIVE:  Note:  Objective measures were completed at Evaluation unless otherwise noted.  DIAGNOSTIC FINDINGS:  06/28/23 MRI IMPRESSION: 1. Mild supraspinatus and subscapularis tendinosis. 2. Moderate tendinosis of the intra-articular portion of the long head of the biceps tendon  PATIENT SURVEYS:  Patient-Specific Activity Scoring Scheme  "0" represents "unable to perform." "10" represents "able to perform at prior level. 0 1 2 3 4 5 6 7 8 9  10 (Date and Score)   Activity Eval     1. Unhooking bra 4    2. Reaching cabinets/drying hair 6    3. Making bed 7   4.    5.    Score 17/30    Total score = sum of the activity scores/number of activities Minimum detectable change (90%CI) for average score = 2 points Minimum detectable change (90%CI) for single activity score = 3 points     COGNITION: Overall cognitive status: Within functional limits for tasks assessed     SENSATION: [facility]   UPPER EXTREMITY ROM:   Active ROM left eval Left 07/16/23  Shoulder flexion 135 150  Shoulder extension    Shoulder abduction 90 90  Shoulder adduction    Shoulder internal rotation L3 behind back L2 behind back  Shoulder external rotation 60 80  Elbow flexion    Elbow extension    Wrist flexion    Wrist extension    Wrist ulnar deviation    Wrist radial deviation    Wrist pronation    Wrist supination    (Blank rows = not tested)  UPPER EXTREMITY MMT:  MMT Left Eval   Shoulder flexion 4   Shoulder extension 3+   Shoulder abduction 4   Shoulder adduction    Shoulder internal rotation 4+   Shoulder external rotation 3+   Middle trapezius 3+   Lower trapezius 3+   Elbow flexion    Elbow extension    Wrist flexion    Wrist extension    Wrist ulnar deviation    Wrist radial deviation    Wrist pronation    Wrist supination    Grip strength (lbs)    (Blank rows = not tested)                                                                                                                               TODAY'S TREATMENT:  07/22/23 Trigger Point Dry Needling (5 min and not included in  billable time) Subsequent Treatment: education previously provided Pt instructed to continue prescribed HEP. Patient Verbal Consent Given: YES Education Handout Provided: yes Muscles Treated:Left shoulder deltoids, RTC insertion, proximal biceps, supraspinatus Electrical Stimulation Performed: NO Treatment Response/Outcome:  good overall tolerance,twitch response noted   Therex -Table ER stretch 10 sec X 5 -Table flexion stretch 10 sec X 5 -Posterior capsule stretch 10 sec X 5 -Bent over shoulder scaption "Y"  X10 -Bent over horizontal abduction "T" X 10 -Shoulder rows green 2X15 -Shoulder extensions green 2X15 -Shoulder bilat ER green 2X10 -Shoulder pendulums with X 20 CW,CCW   07/18/23 TherEx UBE L2 x 4 min (2 min each direction) Rows L3 band 2x10 IR/ER L3 band 2x10 on Lt Standing shoulder extension stretch 5x10 sec hold on Lt  Manual STM with compression to Lt anterior deltoid; skilled palpation and monitoring of soft tissue during DN IASTM with percussive device to Lt anterior and middle deltoid  Trigger Point Dry Needling  Subsequent Treatment: Instructions provided previously at initial dry needling treatment.   Patient Verbal Consent Given: Yes Education Handout Provided: Previously Provided Muscles Treated: Lt anterior deltoid Electrical Stimulation Performed: No Treatment Response/Outcome: twitch responses noted     PATIENT EDUCATION: Education details: HEP, PT plan of care, anatomy and education on exam findings and shoulder, impingement/tendinopathy, ways to decrease inflammation, do not push into pain with exercises.  DN rationale and education Person educated: Patient Education method: Explanation, Demonstration, Verbal cues, and Handouts Education comprehension: verbalized understanding and needs further education   HOME EXERCISE PROGRAM: Access Code:  [facility] URL: [email or web address] Date: 07/08/2023 Prepared by: Ivery Quale  Exercises - Standing Shoulder Posterior Capsule Stretch  - 2 x daily - 6 x weekly - 1 sets - 5 reps - 10 sec hold - Doorway Pec Stretch at 90 Degrees Abduction  - 2 x daily - 6 x weekly - 1 sets - 5 reps - 10 sec hold - Standing Shoulder Internal Rotation Stretch with Towel (Mirrored)  - 2 x daily - 6 x weekly - 1 sets - 10 reps - 5 sec hold - Standing Shoulder Row with Anchored Resistance  - 2 x daily - 6 x weekly - 2 sets - 10 reps - Shoulder Extension with Resistance - Neutral  - 2 x daily - 6 x weekly - 2 sets - 10 reps - Supine Shoulder External Rotation with Resistance  - 2 x daily - 6 x weekly - 2 sets - 10 reps - Supine Shoulder Horizontal Abduction with Resistance  - 2 x daily - 6 x weekly - 2 sets - 10 reps  ASSESSMENT:  CLINICAL IMPRESSION:  She is showing benefit from PT focusing on DN and exercises for strength and shoulder mobility so this was continued again today with good tolerance noted. PT recommending to continue with current plan of care.  OBJECTIVE IMPAIRMENTS: decreased activity tolerance, decreased shoulder mobility, decreased ROM, decreased strength, impaired flexibility, impaired UE use, and pain.  ACTIVITY LIMITATIONS: reaching, lifting, carry,  cleaning,  PERSONAL FACTORS:  DM also affecting patient's functional outcome.  REHAB POTENTIAL: Good  CLINICAL DECISION MAKING: Stable/uncomplicated  EVALUATION COMPLEXITY: Low    GOALS: Short term PT Goals Target date: 08/05/2023   Pt will be I and compliant with HEP. Baseline:  Goal status: New Pt will decrease pain by 25% overall with reaching Baseline: up to 10/10 Goal status: New  Long term PT goals Target date:09/02/2023   Pt will improve Lt shoulder AROM to [facility] (>145 deg flexion  and abduction, >70 ER, >T6 reaching behind back to improve functional reaching Baseline: Goal status: New Pt will improve  Lt  shoulder strength to at least 4+/5 MMT to improve functional strength Baseline: Goal status: New Pt will improve PSFS to at least 23/30 functional to show improved function Baseline: Goal status: New Pt will reduce pain to overall less than 3/10 with usual activity and work activity. Baseline: Goal status: New  PLAN: PT FREQUENCY: 1-2 times per week   PT DURATION: 4-8 weeks  PLANNED INTERVENTIONS (unless contraindicated): aquatic PT, Canalith repositioning, cryotherapy, Electrical stimulation, Iontophoresis with 4 mg/ml dexamethasome, Moist heat, traction, Ultrasound, gait training, Therapeutic exercise, balance training, neuromuscular re-education, patient/family education, manual techniques, passive ROM, dry needling, taping, vasopnuematic device, vestibular, spinal manipulations, joint manipulations 97110-Therapeutic exercises, 97530- Therapeutic activity, 97112- Neuromuscular re-education, 97535- Self Care, 66440- Manual therapy, 97016- Vasopneumatic device, 97035- Ultrasound, 34742- Ionotophoresis 4mg /ml Dexamethasone, and Dry Needling  PLAN FOR NEXT SESSION:  continue DN PRN, shoulder strengthening,  Progress as tolerated.    Ivery Quale, PT, DPT 07/22/23 10:23 AM    Date of referral: 06/27/23 Referring provider:Brooks, Annabelle Harman, DO  Referring diagnosis? M25.512,G89.29 (ICD-10-CM) - Chronic left shoulder pain M67.814 (ICD-10-CM) - Tendinosis of left rotator cuff Treatment diagnosis? (if different than referring diagnosis) m25.512  What was this (referring dx) caused by? Ongoing Issue  Ashby Dawes of Condition: Initial Onset (within last 3 months)   Laterality: Lt  Current Functional Measure Score: Other   Patient-Specific Activity Scoring Scheme  "0" represents "unable to perform." "10" represents "able to perform at prior level. 0 1 2 3 4 5 6 7 8 9  10 (Date and Score)   Activity Eval     1. Unhooking bra 4    2. Reaching cabinets/drying hair 6    3. Making bed 7   4.     5.    Score 17/30    Total score = sum of the activity scores/number of activities Minimum detectable change (90%CI) for average score = 2 points Minimum detectable change (90%CI) for single activity score = 3 points Objective measurements identify impairments when they are compared to normal values, the uninvolved extremity, and prior level of function.  [x]  Yes  []  No  Objective assessment of functional ability: Moderate functional limitations   Briefly describe symptoms:  pain in left shoulder with reaching up, out, or behind her limiting her functional activity  How did symptoms start: insidious onset, PT feels shoulder impingement is root cause.  Average pain intensity:  Last 24 hours: 6  Past week: 6-10  How often does the pt experience symptoms? Frequently  How much have the symptoms interfered with usual daily activities? Moderately  How has condition changed since care began at this facility? NA - initial visit  In general, how is the patients overall health? Good   BACK PAIN (STarT Back Screening Tool) No

## 2023-07-24 ENCOUNTER — Encounter: Payer: Self-pay | Admitting: Physical Therapy

## 2023-07-24 ENCOUNTER — Ambulatory Visit: Admitting: Physical Therapy

## 2023-07-24 DIAGNOSIS — R600 Localized edema: Secondary | ICD-10-CM

## 2023-07-24 DIAGNOSIS — M25512 Pain in left shoulder: Secondary | ICD-10-CM

## 2023-07-24 DIAGNOSIS — G8929 Other chronic pain: Secondary | ICD-10-CM

## 2023-07-24 DIAGNOSIS — M6281 Muscle weakness (generalized): Secondary | ICD-10-CM

## 2023-07-24 NOTE — Therapy (Signed)
 OUTPATIENT PHYSICAL THERAPY SHOULDER TREATMENT   Patient Name: Mercedes Webb MRN: [id number] DOB:April 19, 1956, 68 y.o., female Today's Date: 07/24/2023  END OF SESSION:  PT End of Session - 07/24/23 0829     Visit Number 6    Number of Visits 12    Date for PT Re-Evaluation 09/02/23    Authorization Type [employer] MCR    PT Start Time 0823    PT Stop Time 0906    PT Time Calculation (min) 43 min    Activity Tolerance Patient tolerated treatment well    Behavior During Therapy [facility] for tasks assessed/performed                 Past Medical History:  Diagnosis Date   Anxiety    Cancer (HCC) 2016   Skin CA squamous cell on bilateral feet removed   Cervical dysplasia    h/o   Colon polyps    polyps on first scope ~ 2006, no recurrent polyps ~ 2001 and in 2016.    Diabetes mellitus without complication (HCC)    Endometriosis    Family history of adverse reaction to anesthesia    Mother had difficulty waking & nausea   Headache    Hypertension    Insomnia    Pancreatitis 01/2016   Recurrent cold sores    Past Surgical History:  Procedure Laterality Date   ABDOMINAL HYSTERECTOMY     tah/bso- endometriosis   CHOLECYSTECTOMY     ERCP N/A 02/03/2016   Procedure: ENDOSCOPIC RETROGRADE CHOLANGIOPANCREATOGRAPHY (ERCP);  Surgeon: Jeani Hawking, MD;  Location: [facility] END[facility];  Service: Endoscopy;  Laterality: N/A;   KNEE ARTHROSCOPY     tonsillectomy     TONSILLECTOMY     TUBAL LIGATION     Patient Active Problem List   Diagnosis Date Noted   Pain in left hip 01/04/2022   Trochanteric bursitis, left hip 11/07/2021   Microalbuminuria due to type 2 diabetes mellitus (HCC) 06/17/2019   Controlled type 2 diabetes mellitus without complication, with long-term current use of insulin (HCC) 10/23/2018   Secondary hyperhidrosis- not focal in nature but primarily from waist up is the worst 08/20/2018   Low serum progesterone 05/30/2018   Excessive sweating- likely due to hormonal  11/11/2017   Elevated serum creatinine 09/04/2017   Elevated ALT measurement 09/04/2017   Postmenopausal- 30 yrs ago TAH "yrs ago" 11/12/2016   Hormone imbalance- txed by gyn 11/12/2016   Primary insomnia 11/12/2016   Mood disorder (HCC)- mixed anxiety and depression 11/12/2016   Vitamin D deficiency 09/28/2016   Mixed diabetic hyperlipidemia associated with type 2 diabetes mellitus (HCC) 09/28/2016   Hypokalemia 02/01/2016   Hypertension associated with diabetes (HCC) 01/31/2016   Generalized anxiety disorder 01/31/2016   Surgical menopause 12/23/2014   Status post abdominal hysterectomy 12/23/2014   History of cervical dysplasia 12/23/2014   SUI (stress urinary incontinence, female) 12/23/2014   Obesity, Class I, BMI 30-34.9 12/23/2014    PCP: [name], PA   REFERRING PROVIDER: Madelyn Brunner, DO   REFERRING DIAG: [phone] (ICD-10-CM) - Chronic left shoulder pain M67.814 (ICD-10-CM) - Tendinosis of left rotator cuff  THERAPY DIAG:  Chronic left shoulder pain  Muscle weakness (generalized)  Localized edema  Rationale for Evaluation and Treatment: Rehabilitation  ONSET DATE: pain since November 2024 SUBJECTIVE:  SUBJECTIVE STATEMENT: Relays her shoulder continues to improve. She does still have trouble sleeping on that side  Hand dominance: Right  PERTINENT HISTORY: DM  PAIN:  NPRS scale: 4 currently/10  Pain location:left shoulder Pain description: intermittent, sharp Aggravating factors: raising arm, reaching, making bed, unhooking bra, cleaning Relieving factors: rest, meds  PRECAUTIONS: None  RED FLAGS: None   WEIGHT BEARING RESTRICTIONS: No  FALLS:  Has patient fallen in last 6 months? No  OCCUPATION: retired  PLOF: Independent  PATIENT GOALS:reduce  pain  NEXT MD VISIT:   OBJECTIVE:  Note: Objective measures were completed at Evaluation unless otherwise noted.  DIAGNOSTIC FINDINGS:  06/28/23 MRI IMPRESSION: 1. Mild supraspinatus and subscapularis tendinosis. 2. Moderate tendinosis of the intra-articular portion of the long head of the biceps tendon  PATIENT SURVEYS:  Patient-Specific Activity Scoring Scheme  "0" represents "unable to perform." "10" represents "able to perform at prior level. 0 1 2 3 4 5 6 7 8 9  10 (Date and Score)   Activity Eval     1. Unhooking bra 4    2. Reaching cabinets/drying hair 6    3. Making bed 7   4.    5.    Score 17/30    Total score = sum of the activity scores/number of activities Minimum detectable change (90%CI) for average score = 2 points Minimum detectable change (90%CI) for single activity score = 3 points     COGNITION: Overall cognitive status: Within functional limits for tasks assessed     SENSATION: [facility]   UPPER EXTREMITY ROM:   Active ROM left eval Left 07/16/23  Shoulder flexion 135 150  Shoulder extension    Shoulder abduction 90 90  Shoulder adduction    Shoulder internal rotation L3 behind back L2 behind back  Shoulder external rotation 60 80  Elbow flexion    Elbow extension    Wrist flexion    Wrist extension    Wrist ulnar deviation    Wrist radial deviation    Wrist pronation    Wrist supination    (Blank rows = not tested)  UPPER EXTREMITY MMT:  MMT Left Eval   Shoulder flexion 4   Shoulder extension 3+   Shoulder abduction 4   Shoulder adduction    Shoulder internal rotation 4+   Shoulder external rotation 3+   Middle trapezius 3+   Lower trapezius 3+   Elbow flexion    Elbow extension    Wrist flexion    Wrist extension    Wrist ulnar deviation    Wrist radial deviation    Wrist pronation    Wrist supination    Grip strength (lbs)    (Blank rows = not tested)                                                                                                                               TODAY'S TREATMENT:  07/24/23 Therapeutic Exercise: UBE level  1 for 3 min forward and backward;  PT verbal & demo cues on upper body posture PT educated pt on well rounded HEP to include flexibility, strength and muscle endurance components.  PT recommended considering using [employer] Medicare Silver Sneakers to cover [employer] or gym membership.  If she is interested in aquatic exercises, 1-2 sessions with PT is recommended. Pt verbalized a general understanding. PT demo & verbal cues on upper body posture limiting head forward and rounded shoulders with ADLs seated, standing & laying down in bed. Upper body posture has effect on shoulder function. Pt verbalized understanding. Supine wand flexion 2# bar X 10 Supine wand abduction 2# bar X 10 Stargazer stretch (bilat ER with hands behind head in supine) 5 sec X 10  Theractivity (strength for push/pull/lift/carry) Rows green 2X10 Shouder extensions green 2X10 Shoulder ER green 2X10    Trigger Point Dry Needling (5 min and not included in billable time) Subsequent Treatment: education previously provided Pt instructed to continue prescribed HEP. Patient Verbal Consent Given: YES Education Handout Provided: yes Muscles Treated:Left shoulder deltoids, RTC insertion, proximal biceps, supraspinatus Electrical Stimulation Performed: NO Treatment Response/Outcome:  good overall tolerance,twitch response noted     TREATMENT:  07/22/23 Trigger Point Dry Needling (5 min and not included in billable time) Subsequent Treatment: education previously provided Pt instructed to continue prescribed HEP. Patient Verbal Consent Given: YES Education Handout Provided: yes Muscles Treated:Left shoulder deltoids, RTC insertion, proximal biceps, supraspinatus Electrical Stimulation Performed: NO Treatment Response/Outcome:  good overall tolerance,twitch response noted   Therex -Table ER stretch 10 sec X  5 -Table flexion stretch 10 sec X 5 -Posterior capsule stretch 10 sec X 5 -Bent over shoulder scaption "Y"  X10 -Bent over horizontal abduction "T" X 10 -Shoulder rows green 2X15 -Shoulder extensions green 2X15 -Shoulder bilat ER green 2X10 -Shoulder pendulums with X 20 CW,CCW  07/22/23 Trigger Point Dry Needling (5 min and not included in billable time) Subsequent Treatment: education previously provided Pt instructed to continue prescribed HEP. Patient Verbal Consent Given: YES Education Handout Provided: yes Muscles Treated:Left shoulder deltoids, RTC insertion, proximal biceps, supraspinatus Electrical Stimulation Performed: NO Treatment Response/Outcome:  good overall tolerance,twitch response noted   Therex -Table ER stretch 10 sec X 5 -Table flexion stretch 10 sec X 5 -Posterior capsule stretch 10 sec X 5 -Bent over shoulder scaption "Y"  X10 -Bent over horizontal abduction "T" X 10 -Shoulder rows green 2X15 -Shoulder extensions green 2X15 -Shoulder bilat ER green 2X10 -Shoulder pendulums with X 20 CW,CCW   07/18/23 TherEx UBE L2 x 4 min (2 min each direction) Rows L3 band 2x10 IR/ER L3 band 2x10 on Lt Standing shoulder extension stretch 5x10 sec hold on Lt  Manual STM with compression to Lt anterior deltoid; skilled palpation and monitoring of soft tissue during DN IASTM with percussive device to Lt anterior and middle deltoid  Trigger Point Dry Needling  Subsequent Treatment: Instructions provided previously at initial dry needling treatment.   Patient Verbal Consent Given: Yes Education Handout Provided: Previously Provided Muscles Treated: Lt anterior deltoid Electrical Stimulation Performed: No Treatment Response/Outcome: twitch responses noted     PATIENT EDUCATION: Education details: HEP, PT plan of care, anatomy and education on exam findings and shoulder, impingement/tendinopathy, ways to decrease inflammation, do not push into pain with  exercises.  DN rationale and education Person educated: Patient Education method: Explanation, Demonstration, Verbal cues, and Handouts Education comprehension: verbalized understanding and needs further education   HOME EXERCISE PROGRAM: Access Code: [facility] URL:  [email or web address] Date: 07/08/2023 Prepared by: Ivery Quale  Exercises - Standing Shoulder Posterior Capsule Stretch  - 2 x daily - 6 x weekly - 1 sets - 5 reps - 10 sec hold - Doorway Pec Stretch at 90 Degrees Abduction  - 2 x daily - 6 x weekly - 1 sets - 5 reps - 10 sec hold - Standing Shoulder Internal Rotation Stretch with Towel (Mirrored)  - 2 x daily - 6 x weekly - 1 sets - 10 reps - 5 sec hold - Standing Shoulder Row with Anchored Resistance  - 2 x daily - 6 x weekly - 2 sets - 10 reps - Shoulder Extension with Resistance - Neutral  - 2 x daily - 6 x weekly - 2 sets - 10 reps - Supine Shoulder External Rotation with Resistance  - 2 x daily - 6 x weekly - 2 sets - 10 reps - Supine Shoulder Horizontal Abduction with Resistance  - 2 x daily - 6 x weekly - 2 sets - 10 reps  ASSESSMENT:  CLINICAL IMPRESSION:  She continues to show progress with PT and is responding well from DN. PT recommending continue with same PT plan as long she continues to improve. She has now met short term PT goals.   OBJECTIVE IMPAIRMENTS: decreased activity tolerance, decreased shoulder mobility, decreased ROM, decreased strength, impaired flexibility, impaired UE use, and pain.  ACTIVITY LIMITATIONS: reaching, lifting, carry,  cleaning,  PERSONAL FACTORS:  DM also affecting patient's functional outcome.  REHAB POTENTIAL: Good  CLINICAL DECISION MAKING: Stable/uncomplicated  EVALUATION COMPLEXITY: Low    GOALS: Short term PT Goals Target date: 08/05/2023   Pt will be I and compliant with HEP. Baseline:  Goal status:MET 07/24/23 Pt will decrease pain by 25% overall with reaching Baseline: up to 10/10 Goal status:  Met 07/24/23  Long term PT goals Target date:09/02/2023   Pt will improve Lt shoulder AROM to [facility] (>145 deg flexion and abduction, >70 ER, >T6 reaching behind back to improve functional reaching Baseline: Goal status: New Pt will improve  Lt shoulder strength to at least 4+/5 MMT to improve functional strength Baseline: Goal status: New Pt will improve PSFS to at least 23/30 functional to show improved function Baseline: Goal status: New Pt will reduce pain to overall less than 3/10 with usual activity and work activity. Baseline: Goal status: New  PLAN: PT FREQUENCY: 1-2 times per week   PT DURATION: 4-8 weeks  PLANNED INTERVENTIONS (unless contraindicated): aquatic PT, Canalith repositioning, cryotherapy, Electrical stimulation, Iontophoresis with 4 mg/ml dexamethasome, Moist heat, traction, Ultrasound, gait training, Therapeutic exercise, balance training, neuromuscular re-education, patient/family education, manual techniques, passive ROM, dry needling, taping, vasopnuematic device, vestibular, spinal manipulations, joint manipulations 97110-Therapeutic exercises, 97530- Therapeutic activity, 97112- Neuromuscular re-education, 97535- Self Care, 16109- Manual therapy, 97016- Vasopneumatic device, 97035- Ultrasound, 60454- Ionotophoresis 4mg /ml Dexamethasone, and Dry Needling  PLAN FOR NEXT SESSION:  continue DN PRN, shoulder strengthening,  Progress as tolerated.    Ivery Quale, PT, DPT 07/24/23 9:20 AM    Date of referral: 06/27/23 Referring provider:Brooks, Annabelle Harman, DO  Referring diagnosis? M25.512,G89.29 (ICD-10-CM) - Chronic left shoulder pain M67.814 (ICD-10-CM) - Tendinosis of left rotator cuff Treatment diagnosis? (if different than referring diagnosis) m25.512  What was this (referring dx) caused by? Ongoing Issue  Ashby Dawes of Condition: Initial Onset (within last 3 months)   Laterality: Lt  Current Functional Measure Score: Other   Patient-Specific Activity  Scoring Scheme  "0" represents "unable to perform." "10" represents "able to perform  at prior level. 0 1 2 3 4 5 6 7 8 9  10 (Date and Score)   Activity Eval     1. Unhooking bra 4    2. Reaching cabinets/drying hair 6    3. Making bed 7   4.    5.    Score 17/30    Total score = sum of the activity scores/number of activities Minimum detectable change (90%CI) for average score = 2 points Minimum detectable change (90%CI) for single activity score = 3 points Objective measurements identify impairments when they are compared to normal values, the uninvolved extremity, and prior level of function.  [x]  Yes  []  No  Objective assessment of functional ability: Moderate functional limitations   Briefly describe symptoms:  pain in left shoulder with reaching up, out, or behind her limiting her functional activity  How did symptoms start: insidious onset, PT feels shoulder impingement is root cause.  Average pain intensity:  Last 24 hours: 6  Past week: 6-10  How often does the pt experience symptoms? Frequently  How much have the symptoms interfered with usual daily activities? Moderately  How has condition changed since care began at this facility? NA - initial visit  In general, how is the patients overall health? Good   BACK PAIN (STarT Back Screening Tool) No

## 2023-07-29 ENCOUNTER — Encounter: Payer: Self-pay | Admitting: Physical Therapy

## 2023-07-29 ENCOUNTER — Ambulatory Visit: Admitting: Physical Therapy

## 2023-07-29 DIAGNOSIS — M25512 Pain in left shoulder: Secondary | ICD-10-CM | POA: Diagnosis not present

## 2023-07-29 DIAGNOSIS — G8929 Other chronic pain: Secondary | ICD-10-CM

## 2023-07-29 DIAGNOSIS — R600 Localized edema: Secondary | ICD-10-CM

## 2023-07-29 DIAGNOSIS — M6281 Muscle weakness (generalized): Secondary | ICD-10-CM

## 2023-07-29 NOTE — Therapy (Signed)
 OUTPATIENT PHYSICAL THERAPY SHOULDER TREATMENT   Patient Name: Mercedes Webb MRN: [id number] DOB:[birth year], 68 y.o., female Today's Date: 07/29/2023  END OF SESSION:  PT End of Session - 07/29/23 0865     Visit Number 7    Number of Visits 12    Date for PT Re-Evaluation 09/02/23    Authorization Type [employer] MCR    PT Start Time 0822    PT Stop Time 0905    PT Time Calculation (min) 43 min    Activity Tolerance Patient tolerated treatment well    Behavior During Therapy [facility] for tasks assessed/performed                 Past Medical History:  Diagnosis Date   Anxiety    Cancer (HCC) 2016   Skin CA squamous cell on bilateral feet removed   Cervical dysplasia    h/o   Colon polyps    polyps on first scope ~ 2006, no recurrent polyps ~ 2001 and in 2016.    Diabetes mellitus without complication (HCC)    Endometriosis    Family history of adverse reaction to anesthesia    Mother had difficulty waking & nausea   Headache    Hypertension    Insomnia    Pancreatitis 01/2016   Recurrent cold sores    Past Surgical History:  Procedure Laterality Date   ABDOMINAL HYSTERECTOMY     tah/bso- endometriosis   CHOLECYSTECTOMY     ERCP N/A 02/03/2016   Procedure: ENDOSCOPIC RETROGRADE CHOLANGIOPANCREATOGRAPHY (ERCP);  Surgeon: Jeani Hawking, MD;  Location: [facility] ENDOSCOPY;  Service: Endoscopy;  Laterality: N/A;   KNEE ARTHROSCOPY     tonsillectomy     TONSILLECTOMY     TUBAL LIGATION     Patient Active Problem List   Diagnosis Date Noted   Pain in left hip 01/04/2022   Trochanteric bursitis, left hip 11/07/2021   Microalbuminuria due to type 2 diabetes mellitus (HCC) 06/17/2019   Controlled type 2 diabetes mellitus without complication, with long-term current use of insulin (HCC) 10/23/2018   Secondary hyperhidrosis- not focal in nature but primarily from waist up is the worst 08/20/2018   Low serum progesterone 05/30/2018   Excessive sweating- likely due to hormonal  11/11/2017   Elevated serum creatinine 09/04/2017   Elevated ALT measurement 09/04/2017   Postmenopausal- 30 yrs ago TAH "yrs ago" 11/12/2016   Hormone imbalance- txed by gyn 11/12/2016   Primary insomnia 11/12/2016   Mood disorder (HCC)- mixed anxiety and depression 11/12/2016   Vitamin D deficiency 09/28/2016   Mixed diabetic hyperlipidemia associated with type 2 diabetes mellitus (HCC) 09/28/2016   Hypokalemia 02/01/2016   Hypertension associated with diabetes (HCC) 01/31/2016   Generalized anxiety disorder 01/31/2016   Surgical menopause 12/23/2014   Status post abdominal hysterectomy 12/23/2014   History of cervical dysplasia 12/23/2014   SUI (stress urinary incontinence, female) 12/23/2014   Obesity, Class I, BMI 30-34.9 12/23/2014    PCP: [name], PA   REFERRING PROVIDER: Madelyn Brunner, DO   REFERRING DIAG: [phone] (ICD-10-CM) - Chronic left shoulder pain M67.814 (ICD-10-CM) - Tendinosis of left rotator cuff  THERAPY DIAG:  Chronic left shoulder pain  Muscle weakness (generalized)  Localized edema  Rationale for Evaluation and Treatment: Rehabilitation  ONSET DATE: pain since November 2024 SUBJECTIVE:  SUBJECTIVE STATEMENT: Relays the pain is not in the front of her shoulder, it is in the back today.   Hand dominance: Right  PERTINENT HISTORY: DM  PAIN:  NPRS scale: 5 currently/10  Pain location:left shoulder Pain description: intermittent, sharp Aggravating factors: raising arm, reaching, making bed, unhooking bra, cleaning Relieving factors: rest, meds  PRECAUTIONS: None  RED FLAGS: None   WEIGHT BEARING RESTRICTIONS: No  FALLS:  Has patient fallen in last 6 months? No  OCCUPATION: retired  PLOF: Independent  PATIENT GOALS:reduce pain  NEXT MD  VISIT:   OBJECTIVE:  Note: Objective measures were completed at Evaluation unless otherwise noted.  DIAGNOSTIC FINDINGS:  06/28/23 MRI IMPRESSION: 1. Mild supraspinatus and subscapularis tendinosis. 2. Moderate tendinosis of the intra-articular portion of the long head of the biceps tendon  PATIENT SURVEYS:  Patient-Specific Activity Scoring Scheme  "0" represents "unable to perform." "10" represents "able to perform at prior level. 0 1 2 3 4 5 6 7 8 9  10 (Date and Score)   Activity Eval     1. Unhooking bra 4    2. Reaching cabinets/drying hair 6    3. Making bed 7   4.    5.    Score 17/30    Total score = sum of the activity scores/number of activities Minimum detectable change (90%CI) for average score = 2 points Minimum detectable change (90%CI) for single activity score = 3 points     COGNITION: Overall cognitive status: Within functional limits for tasks assessed     SENSATION: [facility]   UPPER EXTREMITY ROM:   Active ROM left eval Left 07/16/23  Shoulder flexion 135 150  Shoulder extension    Shoulder abduction 90 90  Shoulder adduction    Shoulder internal rotation L3 behind back L2 behind back  Shoulder external rotation 60 80  Elbow flexion    Elbow extension    Wrist flexion    Wrist extension    Wrist ulnar deviation    Wrist radial deviation    Wrist pronation    Wrist supination    (Blank rows = not tested)  UPPER EXTREMITY MMT:  MMT Left Eval   Shoulder flexion 4   Shoulder extension 3+   Shoulder abduction 4   Shoulder adduction    Shoulder internal rotation 4+   Shoulder external rotation 3+   Middle trapezius 3+   Lower trapezius 3+   Elbow flexion    Elbow extension    Wrist flexion    Wrist extension    Wrist ulnar deviation    Wrist radial deviation    Wrist pronation    Wrist supination    Grip strength (lbs)    (Blank rows = not tested)                                                                                                                               TODAY'S TREATMENT:  07/29/23 Therapeutic  Exercise: UBE level 3 for 4 min forward and backward Supine wand flexion 2# bar X 15 Supine wand abduction 2# bar X 15 Supine wand ER 2# bar X 15 Stargazer stretch (bilat ER with hands behind head in supine) 5 sec X 10  Theractivity (strength for push/pull/lift/carry) Rows green 2X15 Shouder extensions green 2X15 Shoulder bilat ER green 2X10 Shoulder bilat horizontal abd green X 15 Bicep curl green 2X10    Trigger Point Dry Needling (5 min and not included in billable time) Subsequent Treatment: education previously provided Pt instructed to continue prescribed HEP. Patient Verbal Consent Given: YES Education Handout Provided: yes Muscles Treated:Left shoulder mid and posterior deltoid, RTC insertion, supraspinatus, infraspinatus. Electrical Stimulation Performed: NO Treatment Response/Outcome:  good overall tolerance,twitch response noted   07/24/23 Therapeutic Exercise: UBE level 1 for 3 min forward and backward;  PT verbal & demo cues on upper body posture PT educated pt on well rounded HEP to include flexibility, strength and muscle endurance components.  PT recommended considering using [employer] Silver Sneakers to cover [employer] or gym membership.  If she is interested in aquatic exercises, 1-2 sessions with PT is recommended. Pt verbalized a general understanding. PT demo & verbal cues on upper body posture limiting head forward and rounded shoulders with ADLs seated, standing & laying down in bed. Upper body posture has effect on shoulder function. Pt verbalized understanding. Supine wand flexion 2# bar X 10 Supine wand abduction 2# bar X 10 Stargazer stretch (bilat ER with hands behind head in supine) 5 sec X 10  Theractivity (strength for push/pull/lift/carry) Rows green 2X10 Shouder extensions green 2X10 Shoulder ER green 2X10    Trigger Point Dry Needling (5 min and not included in  billable time) Subsequent Treatment: education previously provided Pt instructed to continue prescribed HEP. Patient Verbal Consent Given: YES Education Handout Provided: yes Muscles Treated:Left shoulder deltoids, RTC insertion, proximal biceps, supraspinatus Electrical Stimulation Performed: NO Treatment Response/Outcome:  good overall tolerance,twitch response noted    PATIENT EDUCATION: Education details: HEP, PT plan of care, anatomy and education on exam findings and shoulder, impingement/tendinopathy, ways to decrease inflammation, do not push into pain with exercises.  DN rationale and education Person educated: Patient Education method: Explanation, Demonstration, Verbal cues, and Handouts Education comprehension: verbalized understanding and needs further education   HOME EXERCISE PROGRAM: Access Code: [facility] URL: [email or web address] Date: 07/08/2023 Prepared by: Ivery Quale  Exercises - Standing Shoulder Posterior Capsule Stretch  - 2 x daily - 6 x weekly - 1 sets - 5 reps - 10 sec hold - Doorway Pec Stretch at 90 Degrees Abduction  - 2 x daily - 6 x weekly - 1 sets - 5 reps - 10 sec hold - Standing Shoulder Internal Rotation Stretch with Towel (Mirrored)  - 2 x daily - 6 x weekly - 1 sets - 10 reps - 5 sec hold - Standing Shoulder Row with Anchored Resistance  - 2 x daily - 6 x weekly - 2 sets - 10 reps - Shoulder Extension with Resistance - Neutral  - 2 x daily - 6 x weekly - 2 sets - 10 reps - Supine Shoulder External Rotation with Resistance  - 2 x daily - 6 x weekly - 2 sets - 10 reps - Supine Shoulder Horizontal Abduction with Resistance  - 2 x daily - 6 x weekly - 2 sets - 10 reps  ASSESSMENT:  CLINICAL IMPRESSION:  The pain has improved in anterior left shoulder but still with some  pain complaints posterior left shoulder so focused DN treatment there today. She was able to progress strength program today and had good tolerance to this noted.    OBJECTIVE IMPAIRMENTS: decreased activity tolerance, decreased shoulder mobility, decreased ROM, decreased strength, impaired flexibility, impaired UE use, and pain.  ACTIVITY LIMITATIONS: reaching, lifting, carry,  cleaning,  PERSONAL FACTORS:  DM also affecting patient's functional outcome.  REHAB POTENTIAL: Good  CLINICAL DECISION MAKING: Stable/uncomplicated  EVALUATION COMPLEXITY: Low    GOALS: Short term PT Goals Target date: 08/05/2023   Pt will be I and compliant with HEP. Baseline:  Goal status:MET 07/24/23 Pt will decrease pain by 25% overall with reaching Baseline: up to 10/10 Goal status: Met 07/24/23  Long term PT goals Target date:09/02/2023   Pt will improve Lt shoulder AROM to [facility] (>145 deg flexion and abduction, >70 ER, >T6 reaching behind back to improve functional reaching Baseline: Goal status: MET 07/15/13 Pt will improve  Lt shoulder strength to at least 4+/5 MMT to improve functional strength Baseline: Goal status: ongoing 07/29/23 Pt will improve PSFS to at least 23/30 functional to show improved function Baseline: Goal status: ongoing 07/29/23 Pt will reduce pain to overall less than 3/10 with usual activity and work activity. Baseline: Goal status: ongoing 07/29/23  PLAN: PT FREQUENCY: 1-2 times per week   PT DURATION: 4-8 weeks  PLANNED INTERVENTIONS (unless contraindicated): aquatic PT, Canalith repositioning, cryotherapy, Electrical stimulation, Iontophoresis with 4 mg/ml dexamethasome, Moist heat, traction, Ultrasound, gait training, Therapeutic exercise, balance training, neuromuscular re-education, patient/family education, manual techniques, passive ROM, dry needling, taping, vasopnuematic device, vestibular, spinal manipulations, joint manipulations 97110-Therapeutic exercises, 97530- Therapeutic activity, 97112- Neuromuscular re-education, 97535- Self Care, 40981- Manual therapy, 97016- Vasopneumatic device, 97035- Ultrasound, 19147-  Ionotophoresis 4mg /ml Dexamethasone, and Dry Needling  PLAN FOR NEXT SESSION:  continue DN PRN, shoulder strengthening,  Progress as tolerated.    Ivery Quale, PT, DPT 07/29/23 8:23 AM    Date of referral: 06/27/23 Referring provider:Brooks, Annabelle Harman, DO  Referring diagnosis? M25.512,G89.29 (ICD-10-CM) - Chronic left shoulder pain M67.814 (ICD-10-CM) - Tendinosis of left rotator cuff Treatment diagnosis? (if different than referring diagnosis) m25.512  What was this (referring dx) caused by? Ongoing Issue  Ashby Dawes of Condition: Initial Onset (within last 3 months)   Laterality: Lt  Current Functional Measure Score: Other   Patient-Specific Activity Scoring Scheme  "0" represents "unable to perform." "10" represents "able to perform at prior level. 0 1 2 3 4 5 6 7 8 9  10 (Date and Score)   Activity Eval     1. Unhooking bra 4    2. Reaching cabinets/drying hair 6    3. Making bed 7   4.    5.    Score 17/30    Total score = sum of the activity scores/number of activities Minimum detectable change (90%CI) for average score = 2 points Minimum detectable change (90%CI) for single activity score = 3 points Objective measurements identify impairments when they are compared to normal values, the uninvolved extremity, and prior level of function.  [x]  Yes  []  No  Objective assessment of functional ability: Moderate functional limitations   Briefly describe symptoms:  pain in left shoulder with reaching up, out, or behind her limiting her functional activity  How did symptoms start: insidious onset, PT feels shoulder impingement is root cause.  Average pain intensity:  Last 24 hours: 6  Past week: 6-10  How often does the pt experience symptoms? Frequently  How much have the symptoms  interfered with usual daily activities? Moderately  How has condition changed since care began at this facility? NA - initial visit  In general, how is the patients overall health?  Good   BACK PAIN (STarT Back Screening Tool) No

## 2023-07-31 ENCOUNTER — Ambulatory Visit: Admitting: Physical Therapy

## 2023-07-31 ENCOUNTER — Encounter: Payer: Self-pay | Admitting: Physical Therapy

## 2023-07-31 DIAGNOSIS — R600 Localized edema: Secondary | ICD-10-CM

## 2023-07-31 DIAGNOSIS — M25512 Pain in left shoulder: Secondary | ICD-10-CM | POA: Diagnosis not present

## 2023-07-31 DIAGNOSIS — G8929 Other chronic pain: Secondary | ICD-10-CM | POA: Diagnosis not present

## 2023-07-31 DIAGNOSIS — M6281 Muscle weakness (generalized): Secondary | ICD-10-CM | POA: Diagnosis not present

## 2023-07-31 NOTE — Therapy (Signed)
 OUTPATIENT PHYSICAL THERAPY SHOULDER TREATMENT   Patient Name: Mercedes Webb MRN: [id number] DOB:[birth year], 68 y.o., female Today's Date: 07/31/2023  END OF SESSION:  PT End of Session - 07/31/23 0935     Visit Number 8    Number of Visits 12    Date for PT Re-Evaluation 09/02/23    Authorization Type [employer] MCR    PT Start Time 0930    PT Stop Time 1013    PT Time Calculation (min) 43 min    Activity Tolerance Patient tolerated treatment well    Behavior During Therapy [facility] for tasks assessed/performed                 Past Medical History:  Diagnosis Date   Anxiety    Cancer (HCC) 2016   Skin CA squamous cell on bilateral feet removed   Cervical dysplasia    h/o   Colon polyps    polyps on first scope ~ 2006, no recurrent polyps ~ 2001 and in 2016.    Diabetes mellitus without complication (HCC)    Endometriosis    Family history of adverse reaction to anesthesia    Mother had difficulty waking & nausea   Headache    Hypertension    Insomnia    Pancreatitis 01/2016   Recurrent cold sores    Past Surgical History:  Procedure Laterality Date   ABDOMINAL HYSTERECTOMY     tah/bso- endometriosis   CHOLECYSTECTOMY     ERCP N/A 02/03/2016   Procedure: ENDOSCOPIC RETROGRADE CHOLANGIOPANCREATOGRAPHY (ERCP);  Surgeon: Jeani Hawking, MD;  Location: [facility] ENDOSCOPY;  Service: Endoscopy;  Laterality: N/A;   KNEE ARTHROSCOPY     tonsillectomy     TONSILLECTOMY     TUBAL LIGATION     Patient Active Problem List   Diagnosis Date Noted   Pain in left hip 01/04/2022   Trochanteric bursitis, left hip 11/07/2021   Microalbuminuria due to type 2 diabetes mellitus (HCC) 06/17/2019   Controlled type 2 diabetes mellitus without complication, with long-term current use of insulin (HCC) 10/23/2018   Secondary hyperhidrosis- not focal in nature but primarily from waist up is the worst 08/20/2018   Low serum progesterone 05/30/2018   Excessive sweating- likely due to hormonal  11/11/2017   Elevated serum creatinine 09/04/2017   Elevated ALT measurement 09/04/2017   Postmenopausal- 30 yrs ago TAH "yrs ago" 11/12/2016   Hormone imbalance- txed by gyn 11/12/2016   Primary insomnia 11/12/2016   Mood disorder (HCC)- mixed anxiety and depression 11/12/2016   Vitamin D deficiency 09/28/2016   Mixed diabetic hyperlipidemia associated with type 2 diabetes mellitus (HCC) 09/28/2016   Hypokalemia 02/01/2016   Hypertension associated with diabetes (HCC) 01/31/2016   Generalized anxiety disorder 01/31/2016   Surgical menopause 12/23/2014   Status post abdominal hysterectomy 12/23/2014   History of cervical dysplasia 12/23/2014   SUI (stress urinary incontinence, female) 12/23/2014   Obesity, Class I, BMI 30-34.9 12/23/2014    PCP: [name], PA   REFERRING PROVIDER: Madelyn Brunner, DO   REFERRING DIAG: [phone] (ICD-10-CM) - Chronic left shoulder pain M67.814 (ICD-10-CM) - Tendinosis of left rotator cuff  THERAPY DIAG:  Chronic left shoulder pain  Muscle weakness (generalized)  Localized edema  Rationale for Evaluation and Treatment: Rehabilitation  ONSET DATE: pain since November 2024 SUBJECTIVE:  SUBJECTIVE STATEMENT: Relays the pain is better in the back of shoulder but now has returned to front of shoulder.   Hand dominance: Right  PERTINENT HISTORY: DM  PAIN:  NPRS scale: 4currently/10  Pain location:left shoulder Pain description: intermittent, sharp Aggravating factors: raising arm, reaching, making bed, unhooking bra, cleaning Relieving factors: rest, meds  PRECAUTIONS: None  RED FLAGS: None   WEIGHT BEARING RESTRICTIONS: No  FALLS:  Has patient fallen in last 6 months? No  OCCUPATION: retired  PLOF: Independent  PATIENT GOALS:reduce  pain  NEXT MD VISIT:   OBJECTIVE:  Note: Objective measures were completed at Evaluation unless otherwise noted.  DIAGNOSTIC FINDINGS:  06/28/23 MRI IMPRESSION: 1. Mild supraspinatus and subscapularis tendinosis. 2. Moderate tendinosis of the intra-articular portion of the long head of the biceps tendon  PATIENT SURVEYS:  Patient-Specific Activity Scoring Scheme  "0" represents "unable to perform." "10" represents "able to perform at prior level. 0 1 2 3 4 5 6 7 8 9  10 (Date and Score)   Activity Eval     1. Unhooking bra 4    2. Reaching cabinets/drying hair 6    3. Making bed 7   4.    5.    Score 17/30    Total score = sum of the activity scores/number of activities Minimum detectable change (90%CI) for average score = 2 points Minimum detectable change (90%CI) for single activity score = 3 points     COGNITION: Overall cognitive status: Within functional limits for tasks assessed     SENSATION: [facility]   UPPER EXTREMITY ROM:   Active ROM left eval Left 07/16/23 Left 07/31/23  Shoulder flexion 135 150 160  Shoulder extension     Shoulder abduction 90 90 125  Shoulder adduction     Shoulder internal rotation L3 behind back L2 behind back   Shoulder external rotation 60 80   Elbow flexion     Elbow extension     Wrist flexion     Wrist extension     Wrist ulnar deviation     Wrist radial deviation     Wrist pronation     Wrist supination     (Blank rows = not tested)  UPPER EXTREMITY MMT:  MMT Left Eval   Shoulder flexion 4   Shoulder extension 3+   Shoulder abduction 4   Shoulder adduction    Shoulder internal rotation 4+   Shoulder external rotation 3+   Middle trapezius 3+   Lower trapezius 3+   Elbow flexion    Elbow extension    Wrist flexion    Wrist extension    Wrist ulnar deviation    Wrist radial deviation    Wrist pronation    Wrist supination    Grip strength (lbs)    (Blank rows = not tested)  TODAY'S TREATMENT:  07/31/23 Therapeutic Exercise: UBE level 3 for 4 min forward and backward Standing L stretch 5 sec X 10   Theractivity (strength for push/pull/lift/carry) Rows green 2X15 Shouder extensions green 2X15 Shoulder bilat ER green 2X10 Shoulder bilat horizontal abd green 2X10 Bicep curl green 2X10 Standing wand flexion 1# bar 2 X 10 Standing wand abduction 1# bar 2 X 10 Standding wand ER 1# bar 2 X 10    Trigger Point Dry Needling (5 min and not included in billable time) Subsequent Treatment: education previously provided Pt instructed to continue prescribed HEP. Patient Verbal Consent Given: YES Education Handout Provided: yes Muscles Treated:Left shoulder anterior deltoids and biceps Electrical Stimulation Performed: NO Treatment Response/Outcome:  good overall tolerance,twitch response noted  07/29/23 Therapeutic Exercise: UBE level 3 for 4 min forward and backward Supine wand flexion 2# bar X 15 Supine wand abduction 2# bar X 15 Supine wand ER 2# bar X 15 Stargazer stretch (bilat ER with hands behind head in supine) 5 sec X 10  Theractivity (strength for push/pull/lift/carry) Rows green 2X15 Shouder extensions green 2X15 Shoulder bilat ER green 2X10 Shoulder bilat horizontal abd green X 15 Bicep curl green 2X10    Trigger Point Dry Needling (5 min and not included in billable time) Subsequent Treatment: education previously provided Pt instructed to continue prescribed HEP. Patient Verbal Consent Given: YES Education Handout Provided: yes Muscles Treated:Left shoulder mid and posterior deltoid, RTC insertion, supraspinatus, infraspinatus. Electrical Stimulation Performed: NO Treatment Response/Outcome:  good overall tolerance,twitch response noted     PATIENT EDUCATION: Education details: HEP, PT plan of care, anatomy and education on exam findings and  shoulder, impingement/tendinopathy, ways to decrease inflammation, do not push into pain with exercises.  DN rationale and education Person educated: Patient Education method: Explanation, Demonstration, Verbal cues, and Handouts Education comprehension: verbalized understanding and needs further education   HOME EXERCISE PROGRAM: Access Code: [facility] URL: [email or web address] Date: 07/08/2023 Prepared by: Ivery Quale  Exercises - Standing Shoulder Posterior Capsule Stretch  - 2 x daily - 6 x weekly - 1 sets - 5 reps - 10 sec hold - Doorway Pec Stretch at 90 Degrees Abduction  - 2 x daily - 6 x weekly - 1 sets - 5 reps - 10 sec hold - Standing Shoulder Internal Rotation Stretch with Towel (Mirrored)  - 2 x daily - 6 x weekly - 1 sets - 10 reps - 5 sec hold - Standing Shoulder Row with Anchored Resistance  - 2 x daily - 6 x weekly - 2 sets - 10 reps - Shoulder Extension with Resistance - Neutral  - 2 x daily - 6 x weekly - 2 sets - 10 reps - Supine Shoulder External Rotation with Resistance  - 2 x daily - 6 x weekly - 2 sets - 10 reps - Supine Shoulder Horizontal Abduction with Resistance  - 2 x daily - 6 x weekly - 2 sets - 10 reps  ASSESSMENT:  CLINICAL IMPRESSION:  She continues to report progress with pain and functional reaching, but still with some difficulty reaching into abduction and IR that we are working to improve with PT. She showed good improvements with updated shoulder AROM today.  OBJECTIVE IMPAIRMENTS: decreased activity tolerance, decreased shoulder mobility, decreased ROM, decreased strength, impaired flexibility, impaired UE use, and pain.  ACTIVITY LIMITATIONS: reaching, lifting, carry,  cleaning,  PERSONAL FACTORS:  DM also affecting patient's functional outcome.  REHAB POTENTIAL: Good  CLINICAL DECISION MAKING: Stable/uncomplicated  EVALUATION COMPLEXITY: Low  GOALS: Short term PT Goals Target date: 08/05/2023   Pt will be I and  compliant with HEP. Baseline:  Goal status:MET 07/24/23 Pt will decrease pain by 25% overall with reaching Baseline: up to 10/10 Goal status: Met 07/24/23  Long term PT goals Target date:09/02/2023   Pt will improve Lt shoulder AROM to [facility] (>145 deg flexion and abduction, >70 ER, >T6 reaching behind back to improve functional reaching Baseline: Goal status: MET 07/15/13 Pt will improve  Lt shoulder strength to at least 4+/5 MMT to improve functional strength Baseline: Goal status: ongoing 07/29/23 Pt will improve PSFS to at least 23/30 functional to show improved function Baseline: Goal status: ongoing 07/29/23 Pt will reduce pain to overall less than 3/10 with usual activity and work activity. Baseline: Goal status: ongoing 07/29/23  PLAN: PT FREQUENCY: 1-2 times per week   PT DURATION: 4-8 weeks  PLANNED INTERVENTIONS (unless contraindicated): aquatic PT, Canalith repositioning, cryotherapy, Electrical stimulation, Iontophoresis with 4 mg/ml dexamethasome, Moist heat, traction, Ultrasound, gait training, Therapeutic exercise, balance training, neuromuscular re-education, patient/family education, manual techniques, passive ROM, dry needling, taping, vasopnuematic device, vestibular, spinal manipulations, joint manipulations 97110-Therapeutic exercises, 97530- Therapeutic activity, 97112- Neuromuscular re-education, 97535- Self Care, 13244- Manual therapy, 97016- Vasopneumatic device, 97035- Ultrasound, 01027- Ionotophoresis 4mg /ml Dexamethasone, and Dry Needling  PLAN FOR NEXT SESSION:  continue DN PRN, shoulder strengthening,  Progress as tolerated.    Ivery Quale, PT, DPT 07/31/23 9:35 AM    Date of referral: 06/27/23 Referring provider:Brooks, Annabelle Harman, DO  Referring diagnosis? M25.512,G89.29 (ICD-10-CM) - Chronic left shoulder pain M67.814 (ICD-10-CM) - Tendinosis of left rotator cuff Treatment diagnosis? (if different than referring diagnosis) m25.512  What was this  (referring dx) caused by? Ongoing Issue  Ashby Dawes of Condition: Initial Onset (within last 3 months)   Laterality: Lt  Current Functional Measure Score: Other   Patient-Specific Activity Scoring Scheme  "0" represents "unable to perform." "10" represents "able to perform at prior level. 0 1 2 3 4 5 6 7 8 9  10 (Date and Score)   Activity Eval     1. Unhooking bra 4    2. Reaching cabinets/drying hair 6    3. Making bed 7   4.    5.    Score 17/30    Total score = sum of the activity scores/number of activities Minimum detectable change (90%CI) for average score = 2 points Minimum detectable change (90%CI) for single activity score = 3 points Objective measurements identify impairments when they are compared to normal values, the uninvolved extremity, and prior level of function.  [x]  Yes  []  No  Objective assessment of functional ability: Moderate functional limitations   Briefly describe symptoms:  pain in left shoulder with reaching up, out, or behind her limiting her functional activity  How did symptoms start: insidious onset, PT feels shoulder impingement is root cause.  Average pain intensity:  Last 24 hours: 6  Past week: 6-10  How often does the pt experience symptoms? Frequently  How much have the symptoms interfered with usual daily activities? Moderately  How has condition changed since care began at this facility? NA - initial visit  In general, how is the patients overall health? Good   BACK PAIN (STarT Back Screening Tool) No

## 2023-08-05 ENCOUNTER — Ambulatory Visit: Admitting: Physical Therapy

## 2023-08-05 ENCOUNTER — Encounter: Payer: Self-pay | Admitting: Physical Therapy

## 2023-08-05 DIAGNOSIS — M25512 Pain in left shoulder: Secondary | ICD-10-CM

## 2023-08-05 DIAGNOSIS — M6281 Muscle weakness (generalized): Secondary | ICD-10-CM | POA: Diagnosis not present

## 2023-08-05 DIAGNOSIS — R600 Localized edema: Secondary | ICD-10-CM | POA: Diagnosis not present

## 2023-08-05 DIAGNOSIS — G8929 Other chronic pain: Secondary | ICD-10-CM

## 2023-08-05 NOTE — Therapy (Signed)
 OUTPATIENT PHYSICAL THERAPY SHOULDER TREATMENT   Patient Name: Mercedes Webb MRN: [id number] DOB:Jan 11, 1956, 68 y.o., female Today's Date: 08/05/2023  END OF SESSION:  PT End of Session - 08/05/23 0918     Visit Number 9    Number of Visits 12    Date for PT Re-Evaluation 09/02/23    Authorization Type [employer] MCR 12 visits approaved until 4/21    Authorization - Visit Number 9    Authorization - Number of Visits 12    Progress Note Due on Visit 10    PT Start Time 0843    PT Stop Time 0921    PT Time Calculation (min) 38 min    Activity Tolerance Patient tolerated treatment well    Behavior During Therapy [facility] for tasks assessed/performed                 Past Medical History:  Diagnosis Date   Anxiety    Cancer (HCC) 2016   Skin CA squamous cell on bilateral feet removed   Cervical dysplasia    h/o   Colon polyps    polyps on first scope ~ 2006, no recurrent polyps ~ 2001 and in 2016.    Diabetes mellitus without complication (HCC)    Endometriosis    Family history of adverse reaction to anesthesia    Mother had difficulty waking & nausea   Headache    Hypertension    Insomnia    Pancreatitis 01/2016   Recurrent cold sores    Past Surgical History:  Procedure Laterality Date   ABDOMINAL HYSTERECTOMY     tah/bso- endometriosis   CHOLECYSTECTOMY     ERCP N/A 02/03/2016   Procedure: ENDOSCOPIC RETROGRADE CHOLANGIOPANCREATOGRAPHY (ERCP);  Surgeon: Jeani Hawking, MD;  Location: [facility] ENDOSCOPY;  Service: Endoscopy;  Laterality: N/A;   KNEE ARTHROSCOPY     tonsillectomy     TONSILLECTOMY     TUBAL LIGATION     Patient Active Problem List   Diagnosis Date Noted   Pain in left hip 01/04/2022   Trochanteric bursitis, left hip 11/07/2021   Microalbuminuria due to type 2 diabetes mellitus (HCC) 06/17/2019   Controlled type 2 diabetes mellitus without complication, with long-term current use of insulin (HCC) 10/23/2018   Secondary hyperhidrosis- not focal in  nature but primarily from waist up is the worst 08/20/2018   Low serum progesterone 05/30/2018   Excessive sweating- likely due to hormonal 11/11/2017   Elevated serum creatinine 09/04/2017   Elevated ALT measurement 09/04/2017   Postmenopausal- 30 yrs ago TAH "yrs ago" 11/12/2016   Hormone imbalance- txed by gyn 11/12/2016   Primary insomnia 11/12/2016   Mood disorder (HCC)- mixed anxiety and depression 11/12/2016   Vitamin D deficiency 09/28/2016   Mixed diabetic hyperlipidemia associated with type 2 diabetes mellitus (HCC) 09/28/2016   Hypokalemia 02/01/2016   Hypertension associated with diabetes (HCC) 01/31/2016   Generalized anxiety disorder 01/31/2016   Surgical menopause 12/23/2014   Status post abdominal hysterectomy 12/23/2014   History of cervical dysplasia 12/23/2014   SUI (stress urinary incontinence, female) 12/23/2014   Obesity, Class I, BMI 30-34.9 12/23/2014    PCP: [name], PA   REFERRING PROVIDER: Madelyn Brunner, DO   REFERRING DIAG: [phone] (ICD-10-CM) - Chronic left shoulder pain M67.814 (ICD-10-CM) - Tendinosis of left rotator cuff  THERAPY DIAG:  Chronic left shoulder pain  Muscle weakness (generalized)  Localized edema  Rationale for Evaluation and Treatment: Rehabilitation  ONSET DATE: pain since November 2024 SUBJECTIVE:  SUBJECTIVE STATEMENT: Relays the pain has been better managed. Pain is more lateral mid upper arm area today   Hand dominance: Right  PERTINENT HISTORY: DM  PAIN:  NPRS scale: 4currently/10  Pain location:left shoulder Pain description: intermittent, sharp Aggravating factors: raising arm, reaching, making bed, unhooking bra, cleaning Relieving factors: rest, meds  PRECAUTIONS: None  RED FLAGS: None   WEIGHT BEARING  RESTRICTIONS: No  FALLS:  Has patient fallen in last 6 months? No  OCCUPATION: retired  PLOF: Independent  PATIENT GOALS:reduce pain  NEXT MD VISIT:   OBJECTIVE:  Note: Objective measures were completed at Evaluation unless otherwise noted.  DIAGNOSTIC FINDINGS:  06/28/23 MRI IMPRESSION: 1. Mild supraspinatus and subscapularis tendinosis. 2. Moderate tendinosis of the intra-articular portion of the long head of the biceps tendon  PATIENT SURVEYS:  Patient-Specific Activity Scoring Scheme  "0" represents "unable to perform." "10" represents "able to perform at prior level. 0 1 2 3 4 5 6 7 8 9  10 (Date and Score)   Activity Eval     1. Unhooking bra 4    2. Reaching cabinets/drying hair 6    3. Making bed 7   4.    5.    Score 17/30    Total score = sum of the activity scores/number of activities Minimum detectable change (90%CI) for average score = 2 points Minimum detectable change (90%CI) for single activity score = 3 points     COGNITION: Overall cognitive status: Within functional limits for tasks assessed     SENSATION: [facility]   UPPER EXTREMITY ROM:   Active ROM left eval Left 07/16/23 Left 07/31/23  Shoulder flexion 135 150 160  Shoulder extension     Shoulder abduction 90 90 125  Shoulder adduction     Shoulder internal rotation L3 behind back L2 behind back   Shoulder external rotation 60 80   Elbow flexion     Elbow extension     Wrist flexion     Wrist extension     Wrist ulnar deviation     Wrist radial deviation     Wrist pronation     Wrist supination     (Blank rows = not tested)  UPPER EXTREMITY MMT:  MMT Left Eval   Shoulder flexion 4   Shoulder extension 3+   Shoulder abduction 4   Shoulder adduction    Shoulder internal rotation 4+   Shoulder external rotation 3+   Middle trapezius 3+   Lower trapezius 3+   Elbow flexion    Elbow extension    Wrist flexion    Wrist extension    Wrist ulnar deviation    Wrist radial  deviation    Wrist pronation    Wrist supination    Grip strength (lbs)    (Blank rows = not tested)  TODAY'S TREATMENT:  08/05/23 Therapeutic Exercise: UBE level 3 for 4 min forward and backward Standing L stretch 5 sec X 10   Theractivity (strength for push/pull/lift/carry) Rows green 2X15 Shouder extensions green 2X15  ER green 2X15 IR green 2X15 Shoulder bilat horizontal abd green 2X10   Manual therapy for active compression and skilled palpation during TDN Trigger Point Dry Needling  Subsequent Treatment: education previously provided Pt instructed to continue prescribed HEP. Patient Verbal Consent Given: YES Education Handout Provided: yes Muscles Treated:Left shoulder anterior deltoids and biceps Electrical Stimulation Performed: NO Treatment Response/Outcome:  good overall tolerance,twitch response noted  07/31/23 Therapeutic Exercise: UBE level 3 for 4 min forward and backward Standing L stretch 5 sec X 10   Theractivity (strength for push/pull/lift/carry) Rows green 2X15 Shouder extensions green 2X15 Shoulder bilat ER green 2X10 Shoulder bilat horizontal abd green 2X10 Bicep curl green 2X10 Standing wand flexion 1# bar 2 X 10 Standing wand abduction 1# bar 2 X 10 Standding wand ER 1# bar 2 X 10    Trigger Point Dry Needling (5 min and not included in billable time) Subsequent Treatment: education previously provided Pt instructed to continue prescribed HEP. Patient Verbal Consent Given: YES Education Handout Provided: yes Muscles Treated:Left shoulder anterior deltoids and biceps Electrical Stimulation Performed: NO Treatment Response/Outcome:  good overall tolerance,twitch response noted  07/29/23 Therapeutic Exercise: UBE level 3 for 4 min forward and backward Supine wand flexion 2# bar X 15 Supine wand abduction 2# bar X  15 Supine wand ER 2# bar X 15 Stargazer stretch (bilat ER with hands behind head in supine) 5 sec X 10  Theractivity (strength for push/pull/lift/carry) Rows green 2X15 Shouder extensions green 2X15 Shoulder bilat ER green 2X10 Shoulder bilat horizontal abd green X 15 Bicep curl green 2X10    Trigger Point Dry Needling (5 min and not included in billable time) Subsequent Treatment: education previously provided Pt instructed to continue prescribed HEP. Patient Verbal Consent Given: YES Education Handout Provided: yes Muscles Treated:Left shoulder mid and posterior deltoid, RTC insertion, supraspinatus, infraspinatus. Electrical Stimulation Performed: NO Treatment Response/Outcome:  good overall tolerance,twitch response noted     PATIENT EDUCATION: Education details: HEP, PT plan of care, anatomy and education on exam findings and shoulder, impingement/tendinopathy, ways to decrease inflammation, do not push into pain with exercises.  DN rationale and education Person educated: Patient Education method: Explanation, Demonstration, Verbal cues, and Handouts Education comprehension: verbalized understanding and needs further education   HOME EXERCISE PROGRAM: Access Code: [facility] URL: [email or web address] Date: 07/08/2023 Prepared by: Ivery Quale  Exercises - Standing Shoulder Posterior Capsule Stretch  - 2 x daily - 6 x weekly - 1 sets - 5 reps - 10 sec hold - Doorway Pec Stretch at 90 Degrees Abduction  - 2 x daily - 6 x weekly - 1 sets - 5 reps - 10 sec hold - Standing Shoulder Internal Rotation Stretch with Towel (Mirrored)  - 2 x daily - 6 x weekly - 1 sets - 10 reps - 5 sec hold - Standing Shoulder Row with Anchored Resistance  - 2 x daily - 6 x weekly - 2 sets - 10 reps - Shoulder Extension with Resistance - Neutral  - 2 x daily - 6 x weekly - 2 sets - 10 reps - Supine Shoulder External Rotation with Resistance  - 2 x daily - 6 x weekly - 2 sets - 10  reps - Supine Shoulder Horizontal Abduction with Resistance  - 2 x daily -  6 x weekly - 2 sets - 10 reps  ASSESSMENT:  CLINICAL IMPRESSION:  Her pain has been better managed, she continues to respond well to DN. 10th visit progress note needed next visit.  OBJECTIVE IMPAIRMENTS: decreased activity tolerance, decreased shoulder mobility, decreased ROM, decreased strength, impaired flexibility, impaired UE use, and pain.  ACTIVITY LIMITATIONS: reaching, lifting, carry,  cleaning,  PERSONAL FACTORS:  DM also affecting patient's functional outcome.  REHAB POTENTIAL: Good  CLINICAL DECISION MAKING: Stable/uncomplicated  EVALUATION COMPLEXITY: Low    GOALS: Short term PT Goals Target date: 08/05/2023   Pt will be I and compliant with HEP. Baseline:  Goal status:MET 07/24/23 Pt will decrease pain by 25% overall with reaching Baseline: up to 10/10 Goal status: Met 07/24/23  Long term PT goals Target date:09/02/2023   Pt will improve Lt shoulder AROM to [facility] (>145 deg flexion and abduction, >70 ER, >T6 reaching behind back to improve functional reaching Baseline: Goal status: MET 07/15/13 Pt will improve  Lt shoulder strength to at least 4+/5 MMT to improve functional strength Baseline: Goal status: ongoing 07/29/23 Pt will improve PSFS to at least 23/30 functional to show improved function Baseline: Goal status: ongoing 07/29/23 Pt will reduce pain to overall less than 3/10 with usual activity and work activity. Baseline: Goal status: ongoing 07/29/23  PLAN: PT FREQUENCY: 1-2 times per week   PT DURATION: 4-8 weeks  PLANNED INTERVENTIONS (unless contraindicated): aquatic PT, Canalith repositioning, cryotherapy, Electrical stimulation, Iontophoresis with 4 mg/ml dexamethasome, Moist heat, traction, Ultrasound, gait training, Therapeutic exercise, balance training, neuromuscular re-education, patient/family education, manual techniques, passive ROM, dry needling, taping, vasopnuematic  device, vestibular, spinal manipulations, joint manipulations 97110-Therapeutic exercises, 97530- Therapeutic activity, 97112- Neuromuscular re-education, 97535- Self Care, 56213- Manual therapy, 97016- Vasopneumatic device, 97035- Ultrasound, 08657- Ionotophoresis 4mg /ml Dexamethasone, and Dry Needling  PLAN FOR NEXT SESSION:   Do progress note continue DN PRN, shoulder strengthening,  Progress as tolerated.    Ivery Quale, PT, DPT 08/05/23 9:24 AM    Date of referral: 06/27/23 Referring provider:Brooks, Annabelle Harman, DO  Referring diagnosis? M25.512,G89.29 (ICD-10-CM) - Chronic left shoulder pain M67.814 (ICD-10-CM) - Tendinosis of left rotator cuff Treatment diagnosis? (if different than referring diagnosis) m25.512  What was this (referring dx) caused by? Ongoing Issue  Ashby Dawes of Condition: Initial Onset (within last 3 months)   Laterality: Lt  Current Functional Measure Score: Other   Patient-Specific Activity Scoring Scheme  "0" represents "unable to perform." "10" represents "able to perform at prior level. 0 1 2 3 4 5 6 7 8 9  10 (Date and Score)   Activity Eval     1. Unhooking bra 4    2. Reaching cabinets/drying hair 6    3. Making bed 7   4.    5.    Score 17/30    Total score = sum of the activity scores/number of activities Minimum detectable change (90%CI) for average score = 2 points Minimum detectable change (90%CI) for single activity score = 3 points Objective measurements identify impairments when they are compared to normal values, the uninvolved extremity, and prior level of function.  [x]  Yes  []  No  Objective assessment of functional ability: Moderate functional limitations   Briefly describe symptoms:  pain in left shoulder with reaching up, out, or behind her limiting her functional activity  How did symptoms start: insidious onset, PT feels shoulder impingement is root cause.  Average pain intensity:  Last 24 hours: 6  Past week: 6-10  How  often  does the pt experience symptoms? Frequently  How much have the symptoms interfered with usual daily activities? Moderately  How has condition changed since care began at this facility? NA - initial visit  In general, how is the patients overall health? Good   BACK PAIN (STarT Back Screening Tool) No

## 2023-08-08 ENCOUNTER — Ambulatory Visit: Admitting: Physical Therapy

## 2023-08-08 ENCOUNTER — Encounter: Payer: Self-pay | Admitting: Physical Therapy

## 2023-08-08 DIAGNOSIS — M25512 Pain in left shoulder: Secondary | ICD-10-CM

## 2023-08-08 DIAGNOSIS — R600 Localized edema: Secondary | ICD-10-CM | POA: Diagnosis not present

## 2023-08-08 DIAGNOSIS — G8929 Other chronic pain: Secondary | ICD-10-CM

## 2023-08-08 DIAGNOSIS — M6281 Muscle weakness (generalized): Secondary | ICD-10-CM | POA: Diagnosis not present

## 2023-08-08 NOTE — Therapy (Signed)
 OUTPATIENT PHYSICAL THERAPY SHOULDER TREATMENT Progress Note reporting period date 07/08/23 to 08/08/23  See below for objective and subjective measurements relating to patients progress with PT.    Patient Name: Mercedes Webb MRN: [id number] DOB:06-Jun-1955, 68 y.o., female Today's Date: 08/08/2023  END OF SESSION:  PT End of Session - 08/08/23 0757     Visit Number 10    Number of Visits 12    Date for PT Re-Evaluation 09/02/23    Authorization Type [employer] MCR 12 visits approaved until 4/21    Authorization - Number of Visits 12    Progress Note Due on Visit 10    PT Start Time 0800    PT Stop Time 0840    PT Time Calculation (min) 40 min    Activity Tolerance Patient tolerated treatment well    Behavior During Therapy [facility] for tasks assessed/performed                 Past Medical History:  Diagnosis Date   Anxiety    Cancer (HCC) 2016   Skin CA squamous cell on bilateral feet removed   Cervical dysplasia    h/o   Colon polyps    polyps on first scope ~ 2006, no recurrent polyps ~ 2001 and in 2016.    Diabetes mellitus without complication (HCC)    Endometriosis    Family history of adverse reaction to anesthesia    Mother had difficulty waking & nausea   Headache    Hypertension    Insomnia    Pancreatitis 01/2016   Recurrent cold sores    Past Surgical History:  Procedure Laterality Date   ABDOMINAL HYSTERECTOMY     tah/bso- endometriosis   CHOLECYSTECTOMY     ERCP N/A 02/03/2016   Procedure: ENDOSCOPIC RETROGRADE CHOLANGIOPANCREATOGRAPHY (ERCP);  Surgeon: Jeani Hawking, MD;  Location: [facility] ENDOSCOPY;  Service: Endoscopy;  Laterality: N/A;   KNEE ARTHROSCOPY     tonsillectomy     TONSILLECTOMY     TUBAL LIGATION     Patient Active Problem List   Diagnosis Date Noted   Pain in left hip 01/04/2022   Trochanteric bursitis, left hip 11/07/2021   Microalbuminuria due to type 2 diabetes mellitus (HCC) 06/17/2019   Controlled type 2 diabetes mellitus  without complication, with long-term current use of insulin (HCC) 10/23/2018   Secondary hyperhidrosis- not focal in nature but primarily from waist up is the worst 08/20/2018   Low serum progesterone 05/30/2018   Excessive sweating- likely due to hormonal 11/11/2017   Elevated serum creatinine 09/04/2017   Elevated ALT measurement 09/04/2017   Postmenopausal- 30 yrs ago TAH "yrs ago" 11/12/2016   Hormone imbalance- txed by gyn 11/12/2016   Primary insomnia 11/12/2016   Mood disorder (HCC)- mixed anxiety and depression 11/12/2016   Vitamin D deficiency 09/28/2016   Mixed diabetic hyperlipidemia associated with type 2 diabetes mellitus (HCC) 09/28/2016   Hypokalemia 02/01/2016   Hypertension associated with diabetes (HCC) 01/31/2016   Generalized anxiety disorder 01/31/2016   Surgical menopause 12/23/2014   Status post abdominal hysterectomy 12/23/2014   History of cervical dysplasia 12/23/2014   SUI (stress urinary incontinence, female) 12/23/2014   Obesity, Class I, BMI 30-34.9 12/23/2014    PCP: [name], PA   REFERRING PROVIDER: Madelyn Brunner, DO   REFERRING DIAG: [phone] (ICD-10-CM) - Chronic left shoulder pain M67.814 (ICD-10-CM) - Tendinosis of left rotator cuff  THERAPY DIAG:  Chronic left shoulder pain  Muscle weakness (generalized)  Localized edema  Rationale  for Evaluation and Treatment: Rehabilitation  ONSET DATE: pain since November 2024 SUBJECTIVE:                                                                                                                                                                                      SUBJECTIVE STATEMENT: Relays the pain is 3/10 today, so much better overall  Hand dominance: Right  PERTINENT HISTORY: DM  PAIN:  NPRS scale: 3 currently/10  Pain location:left shoulder Pain description: intermittent, sharp Aggravating factors: raising arm, reaching, making bed, unhooking bra, cleaning Relieving  factors: rest, meds  PRECAUTIONS: None  RED FLAGS: None   WEIGHT BEARING RESTRICTIONS: No  FALLS:  Has patient fallen in last 6 months? No  OCCUPATION: retired  PLOF: Independent  PATIENT GOALS:reduce pain  NEXT MD VISIT:   OBJECTIVE:  Note: Objective measures were completed at Evaluation unless otherwise noted.  DIAGNOSTIC FINDINGS:  06/28/23 MRI IMPRESSION: 1. Mild supraspinatus and subscapularis tendinosis. 2. Moderate tendinosis of the intra-articular portion of the long head of the biceps tendon  PATIENT SURVEYS:  Patient-Specific Activity Scoring Scheme  "0" represents "unable to perform." "10" represents "able to perform at prior level. 0 1 2 3 4 5 6 7 8 9  10 (Date and Score)   Activity Eval  08/08/23   1. Unhooking bra 4  8  2. Reaching cabinets/drying hair 6 7.5   3. Making bed 7 7  4.    5.    Score 17/30 22.5/30   Total score = sum of the activity scores/number of activities Minimum detectable change (90%CI) for average score = 2 points Minimum detectable change (90%CI) for single activity score = 3 points     COGNITION: Overall cognitive status: Within functional limits for tasks assessed     SENSATION: [facility]   UPPER EXTREMITY ROM:   Active ROM left eval Left 07/16/23 Left 07/31/23 Left 08/08/23  Shoulder flexion 135 150 160 WFL  Shoulder extension      Shoulder abduction 90 90 125 130  Shoulder adduction      Shoulder internal rotation L3 behind back L2 behind back  T12  Shoulder external rotation 60 80    Elbow flexion      Elbow extension      Wrist flexion      Wrist extension      Wrist ulnar deviation      Wrist radial deviation      Wrist pronation      Wrist supination      (Blank rows = not tested)  UPPER EXTREMITY MMT:  MMT Left Eval Left 08/08/23  Shoulder flexion 4 5  Shoulder extension  3+   Shoulder abduction 4 5  Shoulder adduction    Shoulder internal rotation 4+ 5  Shoulder external rotation 3+ 5  Middle  trapezius 3+   Lower trapezius 3+   Elbow flexion    Elbow extension    Wrist flexion    Wrist extension    Wrist ulnar deviation    Wrist radial deviation    Wrist pronation    Wrist supination    Grip strength (lbs)    (Blank rows = not tested)                                                                                                                              TODAY'S TREATMENT:  08/08/23 Theractivity (strength for push/pull/lift/carry) UBE level 3 for 3 min forward and backward Standing L stretch 5 sec X 10 Rows green 2X15 Shouder extensions green 2X15 ER green 2X15 IR green 2X15 Shoulder bilat horizontal abd green 2X10 Bicep curls 2# 2X10 Shoulder abd 1# 2X10 bilat   Manual therapy for active compression and skilled palpation during TDN Trigger Point Dry Needling  Subsequent Treatment: education previously provided Pt instructed to continue prescribed HEP. Patient Verbal Consent Given: YES Education Handout Provided: yes Muscles Treated:Left shoulder anterior deltoids and biceps Electrical Stimulation Performed: NO Treatment Response/Outcome:  good overall tolerance,twitch response noted  08/05/23 Therapeutic Exercise: UBE level 3 for 4 min forward and backward Standing L stretch 5 sec X 10   Theractivity (strength for push/pull/lift/carry) Rows green 2X15 Shouder extensions green 2X15  ER green 2X15 IR green 2X15 Shoulder bilat horizontal abd green 2X10   Manual therapy for active compression and skilled palpation during TDN Trigger Point Dry Needling  Subsequent Treatment: education previously provided Pt instructed to continue prescribed HEP. Patient Verbal Consent Given: YES Education Handout Provided: yes Muscles Treated:Left shoulder anterior deltoids and biceps Electrical Stimulation Performed: NO Treatment Response/Outcome:  good overall tolerance,twitch response noted         PATIENT EDUCATION: Education details: HEP, PT plan of  care, anatomy and education on exam findings and shoulder, impingement/tendinopathy, ways to decrease inflammation, do not push into pain with exercises.  DN rationale and education Person educated: Patient Education method: Explanation, Demonstration, Verbal cues, and Handouts Education comprehension: verbalized understanding and needs further education   HOME EXERCISE PROGRAM: Access Code: [facility] URL: [email or web address] Date: 07/08/2023 Prepared by: Ivery Quale  Exercises - Standing Shoulder Posterior Capsule Stretch  - 2 x daily - 6 x weekly - 1 sets - 5 reps - 10 sec hold - Doorway Pec Stretch at 90 Degrees Abduction  - 2 x daily - 6 x weekly - 1 sets - 5 reps - 10 sec hold - Standing Shoulder Internal Rotation Stretch with Towel (Mirrored)  - 2 x daily - 6 x weekly - 1 sets - 10 reps - 5 sec hold - Standing Shoulder Row with Anchored Resistance  - 2 x daily - 6 x weekly -  2 sets - 10 reps - Shoulder Extension with Resistance - Neutral  - 2 x daily - 6 x weekly - 2 sets - 10 reps - Supine Shoulder External Rotation with Resistance  - 2 x daily - 6 x weekly - 2 sets - 10 reps - Supine Shoulder Horizontal Abduction with Resistance  - 2 x daily - 6 x weekly - 2 sets - 10 reps  ASSESSMENT:  CLINICAL IMPRESSION:  She has now met her short term PT goals and 2/4 long term PT goals. Overall has made good progress with PT but has not yet met all functional goals so recommending to continue with PT.  OBJECTIVE IMPAIRMENTS: decreased activity tolerance, decreased shoulder mobility, decreased ROM, decreased strength, impaired flexibility, impaired UE use, and pain.  ACTIVITY LIMITATIONS: reaching, lifting, carry,  cleaning,  PERSONAL FACTORS:  DM also affecting patient's functional outcome.  REHAB POTENTIAL: Good  CLINICAL DECISION MAKING: Stable/uncomplicated  EVALUATION COMPLEXITY: Low    GOALS: Short term PT Goals Target date: 08/05/2023   Pt will be I and  compliant with HEP. Baseline:  Goal status:MET 07/24/23 Pt will decrease pain by 25% overall with reaching Baseline: up to 10/10 Goal status: Met 07/24/23  Long term PT goals Target date:09/02/2023   Pt will improve Lt shoulder AROM to [facility] (>145 deg flexion and abduction, >70 ER, >T6 reaching behind back to improve functional reaching Baseline: Goal status: ongoing 08/07/13 Pt will improve  Lt shoulder strength to at least 4+/5 MMT to improve functional strength Baseline: Goal status: MET 08/08/23 Pt will improve PSFS to at least 23/30 functional to show improved function Baseline: Goal status: ongoing 08/08/23 Pt will reduce pain to overall less than 3/10 with usual activity and work activity. Baseline: Goal status: MET 08/08/23  PLAN: PT FREQUENCY: 1-2 times per week   PT DURATION: 4-8 weeks  PLANNED INTERVENTIONS (unless contraindicated): aquatic PT, Canalith repositioning, cryotherapy, Electrical stimulation, Iontophoresis with 4 mg/ml dexamethasome, Moist heat, traction, Ultrasound, gait training, Therapeutic exercise, balance training, neuromuscular re-education, patient/family education, manual techniques, passive ROM, dry needling, taping, vasopnuematic device, vestibular, spinal manipulations, joint manipulations 97110-Therapeutic exercises, 97530- Therapeutic activity, 97112- Neuromuscular re-education, 97535- Self Care, 01027- Manual therapy, 97016- Vasopneumatic device, [id number]- Ultrasound, 25366- Ionotophoresis 4mg /ml Dexamethasone, and Dry Needling  PLAN FOR NEXT SESSION:   Will need recert after 12 visits or 4/21. continue DN PRN, shoulder strengthening,  Progress as tolerated.    Ivery Quale, PT, DPT 08/08/23 7:58 AM    Date of referral: 06/27/23 Referring provider:Brooks, Annabelle Harman, DO  Referring diagnosis? M25.512,G89.29 (ICD-10-CM) - Chronic left shoulder pain M67.814 (ICD-10-CM) - Tendinosis of left rotator cuff Treatment diagnosis? (if different than referring  diagnosis) m25.512  What was this (referring dx) caused by? Ongoing Issue  Ashby Dawes of Condition: Initial Onset (within last 3 months)   Laterality: Lt  Current Functional Measure Score: Other   Patient-Specific Activity Scoring Scheme  "0" represents "unable to perform." "10" represents "able to perform at prior level. 0 1 2 3 4 5 6 7 8 9  10 (Date and Score)   Activity Eval     1. Unhooking bra 4    2. Reaching cabinets/drying hair 6    3. Making bed 7   4.    5.    Score 17/30    Total score = sum of the activity scores/number of activities Minimum detectable change (90%CI) for average score = 2 points Minimum detectable change (90%CI) for single activity score = 3 points Objective measurements  identify impairments when they are compared to normal values, the uninvolved extremity, and prior level of function.  [x]  Yes  []  No  Objective assessment of functional ability: Moderate functional limitations   Briefly describe symptoms:  pain in left shoulder with reaching up, out, or behind her limiting her functional activity  How did symptoms start: insidious onset, PT feels shoulder impingement is root cause.  Average pain intensity:  Last 24 hours: 6  Past week: 6-10  How often does the pt experience symptoms? Frequently  How much have the symptoms interfered with usual daily activities? Moderately  How has condition changed since care began at this facility? NA - initial visit  In general, how is the patients overall health? Good   BACK PAIN (STarT Back Screening Tool) No

## 2023-08-09 ENCOUNTER — Other Ambulatory Visit: Payer: Self-pay | Admitting: Family Medicine

## 2023-08-09 DIAGNOSIS — E119 Type 2 diabetes mellitus without complications: Secondary | ICD-10-CM

## 2023-08-13 ENCOUNTER — Encounter: Payer: Self-pay | Admitting: Physical Therapy

## 2023-08-13 ENCOUNTER — Ambulatory Visit: Admitting: Physical Therapy

## 2023-08-13 DIAGNOSIS — M6281 Muscle weakness (generalized): Secondary | ICD-10-CM

## 2023-08-13 DIAGNOSIS — G8929 Other chronic pain: Secondary | ICD-10-CM

## 2023-08-13 DIAGNOSIS — M25512 Pain in left shoulder: Secondary | ICD-10-CM

## 2023-08-13 DIAGNOSIS — R600 Localized edema: Secondary | ICD-10-CM | POA: Diagnosis not present

## 2023-08-13 NOTE — Therapy (Signed)
 OUTPATIENT PHYSICAL THERAPY SHOULDER TREATMENT    Patient Name: Mercedes Webb MRN: [id number] DOB:[birth year], 68 y.o., female Today's Date: 08/13/2023  END OF SESSION:  PT End of Session - 08/13/23 0859     Visit Number 11    Number of Visits 12    Date for PT Re-Evaluation 09/02/23    Authorization Type [employer] MCR 12 visits approaved until 4/21    Authorization - Visit Number 10    Authorization - Number of Visits 12    Progress Note Due on Visit 10    PT Start Time 0818    PT Stop Time 0856    PT Time Calculation (min) 38 min    Activity Tolerance Patient tolerated treatment well    Behavior During Therapy [facility] for tasks assessed/performed                  Past Medical History:  Diagnosis Date   Anxiety    Cancer (HCC) 2016   Skin CA squamous cell on bilateral feet removed   Cervical dysplasia    h/o   Colon polyps    polyps on first scope ~ 2006, no recurrent polyps ~ 2001 and in 2016.    Diabetes mellitus without complication (HCC)    Endometriosis    Family history of adverse reaction to anesthesia    Mother had difficulty waking & nausea   Headache    Hypertension    Insomnia    Pancreatitis 01/2016   Recurrent cold sores    Past Surgical History:  Procedure Laterality Date   ABDOMINAL HYSTERECTOMY     tah/bso- endometriosis   CHOLECYSTECTOMY     ERCP N/A 02/03/2016   Procedure: ENDOSCOPIC RETROGRADE CHOLANGIOPANCREATOGRAPHY (ERCP);  Surgeon: Jeani Hawking, MD;  Location: [facility] ENDOSCOPY;  Service: Endoscopy;  Laterality: N/A;   KNEE ARTHROSCOPY     tonsillectomy     TONSILLECTOMY     TUBAL LIGATION     Patient Active Problem List   Diagnosis Date Noted   Pain in left hip 01/04/2022   Trochanteric bursitis, left hip 11/07/2021   Microalbuminuria due to type 2 diabetes mellitus (HCC) 06/17/2019   Controlled type 2 diabetes mellitus without complication, with long-term current use of insulin (HCC) 10/23/2018   Secondary hyperhidrosis- not focal  in nature but primarily from waist up is the worst 08/20/2018   Low serum progesterone 05/30/2018   Excessive sweating- likely due to hormonal 11/11/2017   Elevated serum creatinine 09/04/2017   Elevated ALT measurement 09/04/2017   Postmenopausal- 30 yrs ago TAH "yrs ago" 11/12/2016   Hormone imbalance- txed by gyn 11/12/2016   Primary insomnia 11/12/2016   Mood disorder (HCC)- mixed anxiety and depression 11/12/2016   Vitamin D deficiency 09/28/2016   Mixed diabetic hyperlipidemia associated with type 2 diabetes mellitus (HCC) 09/28/2016   Hypokalemia 02/01/2016   Hypertension associated with diabetes (HCC) 01/31/2016   Generalized anxiety disorder 01/31/2016   Surgical menopause 12/23/2014   Status post abdominal hysterectomy 12/23/2014   History of cervical dysplasia 12/23/2014   SUI (stress urinary incontinence, female) 12/23/2014   Obesity, Class I, BMI 30-34.9 12/23/2014    PCP: [name], PA   REFERRING PROVIDER: Madelyn Brunner, DO   REFERRING DIAG: [phone] (ICD-10-CM) - Chronic left shoulder pain M67.814 (ICD-10-CM) - Tendinosis of left rotator cuff  THERAPY DIAG:  Chronic left shoulder pain  Muscle weakness (generalized)  Localized edema  Rationale for Evaluation and Treatment: Rehabilitation  ONSET DATE: pain since November 2024 SUBJECTIVE:  SUBJECTIVE STATEMENT: Relays the pain is 4/10 with some tenderness in bicep area  Hand dominance: Right  PERTINENT HISTORY: DM  PAIN:  NPRS scale: 4 currently/10  Pain location:left shoulder Pain description: intermittent, sharp Aggravating factors: raising arm, reaching, making bed, unhooking bra, cleaning Relieving factors: rest, meds  PRECAUTIONS: None  RED FLAGS: None   WEIGHT BEARING RESTRICTIONS: No  FALLS:   Has patient fallen in last 6 months? No  OCCUPATION: retired  PLOF: Independent  PATIENT GOALS:reduce pain  NEXT MD VISIT:   OBJECTIVE:  Note: Objective measures were completed at Evaluation unless otherwise noted.  DIAGNOSTIC FINDINGS:  06/28/23 MRI IMPRESSION: 1. Mild supraspinatus and subscapularis tendinosis. 2. Moderate tendinosis of the intra-articular portion of the long head of the biceps tendon  PATIENT SURVEYS:  Patient-Specific Activity Scoring Scheme  "0" represents "unable to perform." "10" represents "able to perform at prior level. 0 1 2 3 4 5 6 7 8 9  10 (Date and Score)   Activity Eval  08/08/23   1. Unhooking bra 4  8  2. Reaching cabinets/drying hair 6 7.5   3. Making bed 7 7  4.    5.    Score 17/30 22.5/30   Total score = sum of the activity scores/number of activities Minimum detectable change (90%CI) for average score = 2 points Minimum detectable change (90%CI) for single activity score = 3 points     COGNITION: Overall cognitive status: Within functional limits for tasks assessed     SENSATION: [facility]   UPPER EXTREMITY ROM:   Active ROM left eval Left 07/16/23 Left 07/31/23 Left 08/08/23  Shoulder flexion 135 150 160 WFL  Shoulder extension      Shoulder abduction 90 90 125 130  Shoulder adduction      Shoulder internal rotation L3 behind back L2 behind back  T12  Shoulder external rotation 60 80    Elbow flexion      Elbow extension      Wrist flexion      Wrist extension      Wrist ulnar deviation      Wrist radial deviation      Wrist pronation      Wrist supination      (Blank rows = not tested)  UPPER EXTREMITY MMT:  MMT Left Eval Left 08/08/23  Shoulder flexion 4 5  Shoulder extension 3+   Shoulder abduction 4 5  Shoulder adduction    Shoulder internal rotation 4+ 5  Shoulder external rotation 3+ 5  Middle trapezius 3+   Lower trapezius 3+   Elbow flexion    Elbow extension    Wrist flexion    Wrist  extension    Wrist ulnar deviation    Wrist radial deviation    Wrist pronation    Wrist supination    Grip strength (lbs)    (Blank rows = not tested)  TODAY'S TREATMENT:  08/13/23 Theractivity (strength for push/pull/lift/carry) UBE level 3 for 3 min forward and backward Standing L stretch 5 sec X 10 Blue 2X15 Blue 2X15 ER blue 2X10 IR blue 2X10 Shoulder bilat horizontal abd green 2X10 Bicep curls blue 2X10 Shoulder abd 1# 2X10 bilat   Manual therapy for active compression and skilled palpation during TDN Trigger Point Dry Needling  Subsequent Treatment: education previously provided Pt instructed to continue prescribed HEP. Patient Verbal Consent Given: YES Education Handout Provided: yes Muscles Treated:Left shoulder anterior deltoids and biceps, triceps Electrical Stimulation Performed: NO Treatment Response/Outcome:  good overall tolerance,twitch response noted  08/08/23 Theractivity (strength for push/pull/lift/carry) UBE level 3 for 3 min forward and backward Standing L stretch 5 sec X 10 Rows green 2X15 Shouder extensions green 2X15 ER green 2X15 IR green 2X15 Shoulder bilat horizontal abd green 2X10 Bicep curls 2# 2X10 Shoulder abd 1# 2X10 bilat   Manual therapy for active compression and skilled palpation during TDN Trigger Point Dry Needling  Subsequent Treatment: education previously provided Pt instructed to continue prescribed HEP. Patient Verbal Consent Given: YES Education Handout Provided: yes Muscles Treated:Left shoulder anterior deltoids and biceps Electrical Stimulation Performed: NO Treatment Response/Outcome:  good overall tolerance,twitch response noted   PATIENT EDUCATION: Education details: HEP, PT plan of care, anatomy and education on exam findings and shoulder, impingement/tendinopathy, ways to decrease  inflammation, do not push into pain with exercises.  DN rationale and education Person educated: Patient Education method: Explanation, Demonstration, Verbal cues, and Handouts Education comprehension: verbalized understanding and needs further education   HOME EXERCISE PROGRAM: Access Code: [facility] URL: [email or web address] Date: 07/08/2023 Prepared by: Ivery Quale  Exercises - Standing Shoulder Posterior Capsule Stretch  - 2 x daily - 6 x weekly - 1 sets - 5 reps - 10 sec hold - Doorway Pec Stretch at 90 Degrees Abduction  - 2 x daily - 6 x weekly - 1 sets - 5 reps - 10 sec hold - Standing Shoulder Internal Rotation Stretch with Towel (Mirrored)  - 2 x daily - 6 x weekly - 1 sets - 10 reps - 5 sec hold - Standing Shoulder Row with Anchored Resistance  - 2 x daily - 6 x weekly - 2 sets - 10 reps - Shoulder Extension with Resistance - Neutral  - 2 x daily - 6 x weekly - 2 sets - 10 reps - Supine Shoulder External Rotation with Resistance  - 2 x daily - 6 x weekly - 2 sets - 10 reps - Supine Shoulder Horizontal Abduction with Resistance  - 2 x daily - 6 x weekly - 2 sets - 10 reps  ASSESSMENT:  CLINICAL IMPRESSION:  She does still have some pain in shoulder but a lot better than when she started PT. She did request blue band today to match her progress with gaining strength so I provided her one.   OBJECTIVE IMPAIRMENTS: decreased activity tolerance, decreased shoulder mobility, decreased ROM, decreased strength, impaired flexibility, impaired UE use, and pain.  ACTIVITY LIMITATIONS: reaching, lifting, carry,  cleaning,  PERSONAL FACTORS:  DM also affecting patient's functional outcome.  REHAB POTENTIAL: Good  CLINICAL DECISION MAKING: Stable/uncomplicated  EVALUATION COMPLEXITY: Low    GOALS: Short term PT Goals Target date: 08/05/2023   Pt will be I and compliant with HEP. Baseline:  Goal status:MET 07/24/23 Pt will decrease pain by 25% overall with  reaching Baseline: up to 10/10 Goal status: Met 07/24/23  Long term PT goals Target date:09/02/2023  Pt will improve Lt shoulder AROM to [facility] (>145 deg flexion and abduction, >70 ER, >T6 reaching behind back to improve functional reaching Baseline: Goal status: ongoing 08/07/13 Pt will improve  Lt shoulder strength to at least 4+/5 MMT to improve functional strength Baseline: Goal status: MET 08/08/23 Pt will improve PSFS to at least 23/30 functional to show improved function Baseline: Goal status: ongoing 08/08/23 Pt will reduce pain to overall less than 3/10 with usual activity and work activity. Baseline: Goal status: MET 08/08/23  PLAN: PT FREQUENCY: 1-2 times per week   PT DURATION: 4-8 weeks  PLANNED INTERVENTIONS (unless contraindicated): aquatic PT, Canalith repositioning, cryotherapy, Electrical stimulation, Iontophoresis with 4 mg/ml dexamethasome, Moist heat, traction, Ultrasound, gait training, Therapeutic exercise, balance training, neuromuscular re-education, patient/family education, manual techniques, passive ROM, dry needling, taping, vasopnuematic device, vestibular, spinal manipulations, joint manipulations 97110-Therapeutic exercises, 97530- Therapeutic activity, 97112- Neuromuscular re-education, 97535- Self Care, 16109- Manual therapy, 97016- Vasopneumatic device, 97035- Ultrasound, 60454- Ionotophoresis 4mg /ml Dexamethasone, and Dry Needling  PLAN FOR NEXT SESSION:   Will need recert after 12 visits or 4/21. continue DN PRN, shoulder strengthening,  Progress as tolerated.    Jamee Mazzoni, PT, DPT 08/13/23 9:01 AM    Date of referral: 06/27/23 Referring provider:Brooks, Dana, DO  Referring diagnosis? M25.512,G89.29 (ICD-10-CM) - Chronic left shoulder pain M67.814 (ICD-10-CM) - Tendinosis of left rotator cuff Treatment diagnosis? (if different than referring diagnosis) m25.512  What was this (referring dx) caused by? Ongoing Issue  Lonne Roan of Condition:  Initial Onset (within last 3 months)   Laterality: Lt  Current Functional Measure Score: Other   Patient-Specific Activity Scoring Scheme  "0" represents "unable to perform." "10" represents "able to perform at prior level. 0 1 2 3 4 5 6 7 8 9  10 (Date and Score)   Activity Eval     1. Unhooking bra 4    2. Reaching cabinets/drying hair 6    3. Making bed 7   4.    5.    Score 17/30    Total score = sum of the activity scores/number of activities Minimum detectable change (90%CI) for average score = 2 points Minimum detectable change (90%CI) for single activity score = 3 points Objective measurements identify impairments when they are compared to normal values, the uninvolved extremity, and prior level of function.  [x]  Yes  []  No  Objective assessment of functional ability: Moderate functional limitations   Briefly describe symptoms:  pain in left shoulder with reaching up, out, or behind her limiting her functional activity  How did symptoms start: insidious onset, PT feels shoulder impingement is root cause.  Average pain intensity:  Last 24 hours: 6  Past week: 6-10  How often does the pt experience symptoms? Frequently  How much have the symptoms interfered with usual daily activities? Moderately  How has condition changed since care began at this facility? NA - initial visit  In general, how is the patients overall health? Good   BACK PAIN (STarT Back Screening Tool) No

## 2023-08-15 ENCOUNTER — Ambulatory Visit (INDEPENDENT_AMBULATORY_CARE_PROVIDER_SITE_OTHER): Admitting: Physical Therapy

## 2023-08-15 ENCOUNTER — Encounter: Payer: Self-pay | Admitting: Physical Therapy

## 2023-08-15 DIAGNOSIS — M6281 Muscle weakness (generalized): Secondary | ICD-10-CM | POA: Diagnosis not present

## 2023-08-15 DIAGNOSIS — G8929 Other chronic pain: Secondary | ICD-10-CM

## 2023-08-15 DIAGNOSIS — M25512 Pain in left shoulder: Secondary | ICD-10-CM

## 2023-08-15 DIAGNOSIS — R600 Localized edema: Secondary | ICD-10-CM | POA: Diagnosis not present

## 2023-08-15 NOTE — Therapy (Signed)
 OUTPATIENT PHYSICAL THERAPY SHOULDER TREATMENT/Recert     Patient Name: Mercedes Webb MRN: [id number] DOB:07-03-1955, 68 y.o., female Today's Date: 08/15/2023  END OF SESSION:  PT End of Session - 08/15/23 1201     Visit Number 12    Number of Visits 20    Date for PT Re-Evaluation 09/26/23    Authorization Type [employer] MCR 12 visits approaved until 4/21    Authorization - Visit Number 12    Authorization - Number of Visits 12    Progress Note Due on Visit 10    PT Start Time 1145    PT Stop Time 1223    PT Time Calculation (min) 38 min    Activity Tolerance Patient tolerated treatment well    Behavior During Therapy [facility] for tasks assessed/performed                   Past Medical History:  Diagnosis Date   Anxiety    Cancer (HCC) 2016   Skin CA squamous cell on bilateral feet removed   Cervical dysplasia    h/o   Colon polyps    polyps on first scope ~ 2006, no recurrent polyps ~ 2001 and in 2016.    Diabetes mellitus without complication (HCC)    Endometriosis    Family history of adverse reaction to anesthesia    Mother had difficulty waking & nausea   Headache    Hypertension    Insomnia    Pancreatitis 01/2016   Recurrent cold sores    Past Surgical History:  Procedure Laterality Date   ABDOMINAL HYSTERECTOMY     tah/bso- endometriosis   CHOLECYSTECTOMY     ERCP N/A 02/03/2016   Procedure: ENDOSCOPIC RETROGRADE CHOLANGIOPANCREATOGRAPHY (ERCP);  Surgeon: Alvis Jourdain, MD;  Location: [facility] ENDOSCOPY;  Service: Endoscopy;  Laterality: N/A;   KNEE ARTHROSCOPY     tonsillectomy     TONSILLECTOMY     TUBAL LIGATION     Patient Active Problem List   Diagnosis Date Noted   Pain in left hip 01/04/2022   Trochanteric bursitis, left hip 11/07/2021   Microalbuminuria due to type 2 diabetes mellitus (HCC) 06/17/2019   Controlled type 2 diabetes mellitus without complication, with long-term current use of insulin (HCC) 10/23/2018   Secondary hyperhidrosis-  not focal in nature but primarily from waist up is the worst 08/20/2018   Low serum progesterone 05/30/2018   Excessive sweating- likely due to hormonal 11/11/2017   Elevated serum creatinine 09/04/2017   Elevated ALT measurement 09/04/2017   Postmenopausal- 30 yrs ago TAH "yrs ago" 11/12/2016   Hormone imbalance- txed by gyn 11/12/2016   Primary insomnia 11/12/2016   Mood disorder (HCC)- mixed anxiety and depression 11/12/2016   Vitamin D deficiency 09/28/2016   Mixed diabetic hyperlipidemia associated with type 2 diabetes mellitus (HCC) 09/28/2016   Hypokalemia 02/01/2016   Hypertension associated with diabetes (HCC) 01/31/2016   Generalized anxiety disorder 01/31/2016   Surgical menopause 12/23/2014   Status post abdominal hysterectomy 12/23/2014   History of cervical dysplasia 12/23/2014   SUI (stress urinary incontinence, female) 12/23/2014   Obesity, Class I, BMI 30-34.9 12/23/2014    PCP: Noreene Bearded, PA   REFERRING PROVIDER: Shauna Del, DO   REFERRING DIAG: [phone] (ICD-10-CM) - Chronic left shoulder pain M67.814 (ICD-10-CM) - Tendinosis of left rotator cuff  THERAPY DIAG:  Chronic left shoulder pain  Muscle weakness (generalized)  Localized edema  Rationale for Evaluation and Treatment: Rehabilitation  ONSET DATE: pain since November  2024 SUBJECTIVE:                                                                                                                                                                                      SUBJECTIVE STATEMENT: Relays the pain in her bicep area has improved, does report the pain is now in posterior shoulder and down to elbow today. She can tell that she can reach out higher and she can get her arm behind her back better now. She feels PT is helping and would like to continue.   Hand dominance: Right  PERTINENT HISTORY: DM  PAIN:  NPRS scale: 4 currently/10  Pain location:left shoulder Pain description:  intermittent, sharp Aggravating factors: raising arm, reaching, making bed, unhooking bra, cleaning Relieving factors: rest, meds  PRECAUTIONS: None  RED FLAGS: None   WEIGHT BEARING RESTRICTIONS: No  FALLS:  Has patient fallen in last 6 months? No  OCCUPATION: retired  PLOF: Independent  PATIENT GOALS:reduce pain  NEXT MD VISIT:   OBJECTIVE:  Note: Objective measures were completed at Evaluation unless otherwise noted.  DIAGNOSTIC FINDINGS:  06/28/23 MRI IMPRESSION: 1. Mild supraspinatus and subscapularis tendinosis. 2. Moderate tendinosis of the intra-articular portion of the long head of the biceps tendon  PATIENT SURVEYS:  Patient-Specific Activity Scoring Scheme  "0" represents "unable to perform." "10" represents "able to perform at prior level. 0 1 2 3 4 5 6 7 8 9  10 (Date and Score)   Activity Eval  08/08/23   1. Unhooking bra 4  8  2. Reaching cabinets/drying hair 6 7.5   3. Making bed 7 7  4.    5.    Score 17/30 22.5/30   Total score = sum of the activity scores/number of activities Minimum detectable change (90%CI) for average score = 2 points Minimum detectable change (90%CI) for single activity score = 3 points     COGNITION: Overall cognitive status: Within functional limits for tasks assessed     SENSATION: [facility]   UPPER EXTREMITY ROM:   Active ROM left eval Left 07/16/23 Left 07/31/23 Left 08/08/23 Left 08/15/23  Shoulder flexion 135 150 160 WFL WFL  Shoulder extension       Shoulder abduction 90 90 125 130 140  Shoulder adduction       Shoulder internal rotation L3 behind back L2 behind back  T12 reaching behind back T10 reaching behind back  Shoulder external rotation 60 80     Elbow flexion       Elbow extension       Wrist flexion       Wrist extension       Wrist ulnar  deviation       Wrist radial deviation       Wrist pronation       Wrist supination       (Blank rows = not tested)  UPPER EXTREMITY MMT:  MMT  Left Eval Left 08/08/23  Shoulder flexion 4 5  Shoulder extension 3+   Shoulder abduction 4 5  Shoulder adduction    Shoulder internal rotation 4+ 5  Shoulder external rotation 3+ 5  Middle trapezius 3+   Lower trapezius 3+   Elbow flexion    Elbow extension    Wrist flexion    Wrist extension    Wrist ulnar deviation    Wrist radial deviation    Wrist pronation    Wrist supination    Grip strength (lbs)    (Blank rows = not tested)                                                                                                                              TODAY'S TREATMENT:  08/15/23 Theractivity (strength for push/pull/lift/carry) UBE level 3 for 3 min forward and backward Blue 2X15 Blue 2X15 ER blue 2X10 IR blue 2X10 Shoulder bilat horizontal abd green 2X10 Bicep curls blue 2X10 Shoulder abd 1# 2X10 bilat Standing tricep extension green 2X10   Manual therapy for active compression and skilled palpation during TDN Trigger Point Dry Needling  Subsequent Treatment: education previously provided Pt instructed to continue prescribed HEP. Patient Verbal Consent Given: YES Education Handout Provided: yes Muscles Treated:Left shoulder posterior deltoids, triceps (proximal and distal) Electrical Stimulation Performed: NO Treatment Response/Outcome:  good overall tolerance,twitch response noted  08/13/23 Theractivity (strength for push/pull/lift/carry) UBE level 3 for 3 min forward and backward Standing L stretch 5 sec X 10 Blue 2X15 Blue 2X15 ER blue 2X10 IR blue 2X10 Shoulder bilat horizontal abd green 2X10 Bicep curls blue 2X10 Shoulder abd 1# 2X10 bilat   Manual therapy for active compression and skilled palpation during TDN Trigger Point Dry Needling  Subsequent Treatment: education previously provided Pt instructed to continue prescribed HEP. Patient Verbal Consent Given: YES Education Handout Provided: yes Muscles Treated:Left shoulder anterior deltoids  and biceps, triceps Electrical Stimulation Performed: NO Treatment Response/Outcome:  good overall tolerance,twitch response noted  08/08/23 Theractivity (strength for push/pull/lift/carry) UBE level 3 for 3 min forward and backward Standing L stretch 5 sec X 10 Rows green 2X15 Shouder extensions green 2X15 ER green 2X15 IR green 2X15 Shoulder bilat horizontal abd green 2X10 Bicep curls 2# 2X10 Shoulder abd 1# 2X10 bilat   Manual therapy for active compression and skilled palpation during TDN Trigger Point Dry Needling  Subsequent Treatment: education previously provided Pt instructed to continue prescribed HEP. Patient Verbal Consent Given: YES Education Handout Provided: yes Muscles Treated:Left shoulder anterior deltoids and biceps Electrical Stimulation Performed: NO Treatment Response/Outcome:  good overall tolerance,twitch response noted   PATIENT EDUCATION: Education details: HEP, PT plan of care, anatomy and education on  exam findings and shoulder, impingement/tendinopathy, ways to decrease inflammation, do not push into pain with exercises.  DN rationale and education Person educated: Patient Education method: Explanation, Demonstration, Verbal cues, and Handouts Education comprehension: verbalized understanding and needs further education   HOME EXERCISE PROGRAM: Access Code: [facility] URL: [email or web address] Date: 07/08/2023 Prepared by: Ivery Quale  Exercises - Standing Shoulder Posterior Capsule Stretch  - 2 x daily - 6 x weekly - 1 sets - 5 reps - 10 sec hold - Doorway Pec Stretch at 90 Degrees Abduction  - 2 x daily - 6 x weekly - 1 sets - 5 reps - 10 sec hold - Standing Shoulder Internal Rotation Stretch with Towel (Mirrored)  - 2 x daily - 6 x weekly - 1 sets - 10 reps - 5 sec hold - Standing Shoulder Row with Anchored Resistance  - 2 x daily - 6 x weekly - 2 sets - 10 reps - Shoulder Extension with Resistance - Neutral  - 2 x daily - 6 x  weekly - 2 sets - 10 reps - Supine Shoulder External Rotation with Resistance  - 2 x daily - 6 x weekly - 2 sets - 10 reps - Supine Shoulder Horizontal Abduction with Resistance  - 2 x daily - 6 x weekly - 2 sets - 10 reps  ASSESSMENT:  CLINICAL IMPRESSION:  She feels PT is helping and would like to continue with PT. Recert today to ask for more authorized visits from insurance. Overall has made good progress with PT but has not yet met all her PT goals so PT recommending up to 8 more visits spread out over the next 6 weeks with plan to reduce to one time per week after next week and wean to independent program as able.   OBJECTIVE IMPAIRMENTS: decreased activity tolerance, decreased shoulder mobility, decreased ROM, decreased strength, impaired flexibility, impaired UE use, and pain.  ACTIVITY LIMITATIONS: reaching, lifting, carry,  cleaning,  PERSONAL FACTORS:  DM also affecting patient's functional outcome.  REHAB POTENTIAL: Good  CLINICAL DECISION MAKING: Stable/uncomplicated  EVALUATION COMPLEXITY: Low    GOALS: Short term PT Goals Target date: 08/05/2023   Pt will be I and compliant with HEP. Baseline:  Goal status:MET 07/24/23 Pt will decrease pain by 25% overall with reaching Baseline: up to 10/10 Goal status: Met 07/24/23  Long term PT goals Target date:09/02/2023   Pt will improve Lt shoulder AROM to [facility] (>145 deg flexion and abduction, >70 ER, >T6 reaching behind back to improve functional reaching Baseline: Goal status: ongoing  but very close to meeting 08/15/23 Pt will improve  Lt shoulder strength to at least 4+/5 MMT to improve functional strength Baseline: Goal status: MET 08/08/23 Pt will improve PSFS to at least 23/30 functional to show improved function Baseline: Goal status: ongoing 08/15/23 Pt will reduce pain to overall less than 3/10 with usual activity and work activity. Baseline: Goal status: some days this has been met, other days pain is 4/10,  08/15/23  PLAN: PT FREQUENCY: 1-2 times per week   PT DURATION: 4-8 weeks  PLANNED INTERVENTIONS (unless contraindicated): aquatic PT, Canalith repositioning, cryotherapy, Electrical stimulation, Iontophoresis with 4 mg/ml dexamethasome, Moist heat, traction, Ultrasound, gait training, Therapeutic exercise, balance training, neuromuscular re-education, patient/family education, manual techniques, passive ROM, dry needling, taping, vasopnuematic device, vestibular, spinal manipulations, joint manipulations 97110-Therapeutic exercises, 97530- Therapeutic activity, 97112- Neuromuscular re-education, 97535- Self Care, 16109- Manual therapy, 97016- Vasopneumatic device, 97035- Ultrasound, 60454- Ionotophoresis 4mg /ml Dexamethasone, and Dry Needling  PLAN  FOR NEXT SESSION:   Check for new insurance authorization. continue DN PRN, shoulder strengthening,  Progress as tolerated.    Jamee Mazzoni, PT, DPT 08/15/23 12:33 PM    Date of referral: 06/27/23 Referring provider:Brooks, Dana, DO  Referring diagnosis? M25.512,G89.29 (ICD-10-CM) - Chronic left shoulder pain M67.814 (ICD-10-CM) - Tendinosis of left rotator cuff Treatment diagnosis? (if different than referring diagnosis) m25.512  What was this (referring dx) caused by? Ongoing Issue  Lonne Roan of Condition: Initial Onset (within last 3 months)   Laterality: Lt  Current Functional Measure Score: Other   Patient-Specific Activity Scoring Scheme  "0" represents "unable to perform." "10" represents "able to perform at prior level. 0 1 2 3 4 5 6 7 8 9  10 (Date and Score)  Activity Eval  08/08/23   1. Unhooking bra 4  8  2. Reaching cabinets/drying hair 6 7.5   3. Making bed 7 7  4.    5.    Score 17/30 22.5/30    Total score = sum of the activity scores/number of activities Minimum detectable change (90%CI) for average score = 2 points Minimum detectable change (90%CI) for single activity score = 3 points Objective measurements  identify impairments when they are compared to normal values, the uninvolved extremity, and prior level of function.  [x]  Yes  []  No  Objective assessment of functional ability: Moderate functional limitations   Briefly describe symptoms:  pain in left shoulder with reaching up, out, or behind her limiting her functional activity  How did symptoms start: insidious onset, PT feels shoulder impingement and tendinitis is root cause.  Average pain intensity:  Last 24 hours: 6  Past week: 6-10  How often does the pt experience symptoms? intermittently  How much have the symptoms interfered with usual daily activities? Moderately  How has condition changed since care began at this facility? She has improved about 75%  In general, how is the patients overall health? Good   BACK PAIN (STarT Back Screening Tool) No

## 2023-08-19 ENCOUNTER — Encounter: Payer: Self-pay | Admitting: Physical Therapy

## 2023-08-19 ENCOUNTER — Ambulatory Visit: Admitting: Physical Therapy

## 2023-08-19 DIAGNOSIS — M6281 Muscle weakness (generalized): Secondary | ICD-10-CM | POA: Diagnosis not present

## 2023-08-19 DIAGNOSIS — M25512 Pain in left shoulder: Secondary | ICD-10-CM | POA: Diagnosis not present

## 2023-08-19 DIAGNOSIS — G8929 Other chronic pain: Secondary | ICD-10-CM

## 2023-08-19 DIAGNOSIS — R600 Localized edema: Secondary | ICD-10-CM

## 2023-08-19 NOTE — Therapy (Signed)
 OUTPATIENT PHYSICAL THERAPY SHOULDER TREATMENT     Patient Name: Mercedes Webb MRN: [id number] DOB:Oct 31, 1955, 68 y.o., female Today's Date: 08/19/2023  END OF SESSION:  PT End of Session - 08/19/23 0839     Visit Number 13    Number of Visits 20    Date for PT Re-Evaluation 09/26/23    Authorization Type [employer] MCR 12 visits approaved until 4/21    Authorization - Visit Number 1    PT Start Time 0800    PT Stop Time 0838    PT Time Calculation (min) 38 min    Activity Tolerance Patient tolerated treatment well    Behavior During Therapy [facility] for tasks assessed/performed                    Past Medical History:  Diagnosis Date   Anxiety    Cancer (HCC) 2016   Skin CA squamous cell on bilateral feet removed   Cervical dysplasia    h/o   Colon polyps    polyps on first scope ~ 2006, no recurrent polyps ~ 2001 and in 2016.    Diabetes mellitus without complication (HCC)    Endometriosis    Family history of adverse reaction to anesthesia    Mother had difficulty waking & nausea   Headache    Hypertension    Insomnia    Pancreatitis 01/2016   Recurrent cold sores    Past Surgical History:  Procedure Laterality Date   ABDOMINAL HYSTERECTOMY     tah/bso- endometriosis   CHOLECYSTECTOMY     ERCP N/A 02/03/2016   Procedure: ENDOSCOPIC RETROGRADE CHOLANGIOPANCREATOGRAPHY (ERCP);  Surgeon: Alvis Jourdain, MD;  Location: [facility] ENDOSCOPY;  Service: Endoscopy;  Laterality: N/A;   KNEE ARTHROSCOPY     tonsillectomy     TONSILLECTOMY     TUBAL LIGATION     Patient Active Problem List   Diagnosis Date Noted   Pain in left hip 01/04/2022   Trochanteric bursitis, left hip 11/07/2021   Microalbuminuria due to type 2 diabetes mellitus (HCC) 06/17/2019   Controlled type 2 diabetes mellitus without complication, with long-term current use of insulin  (HCC) 10/23/2018   Secondary hyperhidrosis- not focal in nature but primarily from waist up is the worst 08/20/2018   Low  serum progesterone  05/30/2018   Excessive sweating- likely due to hormonal 11/11/2017   Elevated serum creatinine 09/04/2017   Elevated ALT measurement 09/04/2017   Postmenopausal- 30 yrs ago TAH "yrs ago" 11/12/2016   Hormone imbalance- txed by gyn 11/12/2016   Primary insomnia 11/12/2016   Mood disorder (HCC)- mixed anxiety and depression 11/12/2016   Vitamin D  deficiency 09/28/2016   Mixed diabetic hyperlipidemia associated with type 2 diabetes mellitus (HCC) 09/28/2016   Hypokalemia 02/01/2016   Hypertension associated with diabetes (HCC) 01/31/2016   Generalized anxiety disorder 01/31/2016   Surgical menopause 12/23/2014   Status post abdominal hysterectomy 12/23/2014   History of cervical dysplasia 12/23/2014   SUI (stress urinary incontinence, female) 12/23/2014   Obesity, Class I, BMI 30-34.9 12/23/2014    PCP: Noreene Bearded, PA   REFERRING PROVIDER: Shauna Del, DO   REFERRING DIAG: [phone] (ICD-10-CM) - Chronic left shoulder pain M67.814 (ICD-10-CM) - Tendinosis of left rotator cuff  THERAPY DIAG:  Chronic left shoulder pain  Muscle weakness (generalized)  Localized edema  Rationale for Evaluation and Treatment: Rehabilitation  ONSET DATE: pain since November 2024 SUBJECTIVE:  SUBJECTIVE STATEMENT: Relays the pain is not bad today, did feel a pull in her arm first thing this morning that sort of hurt.   Hand dominance: Right  PERTINENT HISTORY: DM  PAIN:  NPRS scale: 3 currently/10  Pain location:left shoulder Pain description: intermittent, sharp Aggravating factors: raising arm, reaching, making bed, unhooking bra, cleaning Relieving factors: rest, meds  PRECAUTIONS: None  RED FLAGS: None   WEIGHT BEARING RESTRICTIONS: No  FALLS:  Has patient fallen in  last 6 months? No  OCCUPATION: retired  PLOF: Independent  PATIENT GOALS:reduce pain  NEXT MD VISIT:   OBJECTIVE:  Note: Objective measures were completed at Evaluation unless otherwise noted.  DIAGNOSTIC FINDINGS:  06/28/23 MRI IMPRESSION: 1. Mild supraspinatus and subscapularis tendinosis. 2. Moderate tendinosis of the intra-articular portion of the long head of the biceps tendon  PATIENT SURVEYS:  Patient-Specific Activity Scoring Scheme  "0" represents "unable to perform." "10" represents "able to perform at prior level. 0 1 2 3 4 5 6 7 8 9  10 (Date and Score)   Activity Eval  08/08/23   1. Unhooking bra 4  8  2. Reaching cabinets/drying hair 6 7.5   3. Making bed 7 7  4.    5.    Score 17/30 22.5/30   Total score = sum of the activity scores/number of activities Minimum detectable change (90%CI) for average score = 2 points Minimum detectable change (90%CI) for single activity score = 3 points     COGNITION: Overall cognitive status: Within functional limits for tasks assessed     SENSATION: [facility]   UPPER EXTREMITY ROM:   Active ROM left eval Left 07/16/23 Left 07/31/23 Left 08/08/23 Left 08/15/23  Shoulder flexion 135 150 160 WFL WFL  Shoulder extension       Shoulder abduction 90 90 125 130 140  Shoulder adduction       Shoulder internal rotation L3 behind back L2 behind back  T12 reaching behind back T10 reaching behind back  Shoulder external rotation 60 80     Elbow flexion       Elbow extension       Wrist flexion       Wrist extension       Wrist ulnar deviation       Wrist radial deviation       Wrist pronation       Wrist supination       (Blank rows = not tested)  UPPER EXTREMITY MMT:  MMT Left Eval Left 08/08/23  Shoulder flexion 4 5  Shoulder extension 3+   Shoulder abduction 4 5  Shoulder adduction    Shoulder internal rotation 4+ 5  Shoulder external rotation 3+ 5  Middle trapezius 3+   Lower trapezius 3+   Elbow flexion     Elbow extension    Wrist flexion    Wrist extension    Wrist ulnar deviation    Wrist radial deviation    Wrist pronation    Wrist supination    Grip strength (lbs)    (Blank rows = not tested)  TODAY'S TREATMENT:  08/19/23 Theractivity (strength for push/pull/lift/carry) UBE level 3 for 3 min forward and backward Blue 2X15 Blue 2X15 ER green 2X15 IR green  2X15 Shoulder bilat horizontal abd green 2X15 Bicep curls green 2X15 Shoulder abd green X10 bilat Standing tricep extension green 2X10   Manual therapy for active compression and skilled palpation during TDN Trigger Point Dry Needling  Subsequent Treatment: education previously provided Pt instructed to continue prescribed HEP. Patient Verbal Consent Given: YES Education Handout Provided: yes Muscles Treated:Left shoulder posterior deltoids, triceps (proximal and distal) Electrical Stimulation Performed: NO Treatment Response/Outcome:  good overall tolerance,twitch response noted  08/15/23 Theractivity (strength for push/pull/lift/carry) UBE level 3 for 3 min forward and backward Blue 2X15 Blue 2X15 ER blue 2X10 IR blue 2X10 Shoulder bilat horizontal abd green 2X10 Bicep curls blue 2X10 Shoulder abd 1# 2X10 bilat Standing tricep extension green 2X10   Manual therapy for active compression and skilled palpation during TDN Trigger Point Dry Needling  Subsequent Treatment: education previously provided Pt instructed to continue prescribed HEP. Patient Verbal Consent Given: YES Education Handout Provided: yes Muscles Treated:Left shoulder posterior deltoids, triceps (proximal and distal) Electrical Stimulation Performed: NO Treatment Response/Outcome:  good overall tolerance,twitch response noted  08/13/23 Theractivity (strength for push/pull/lift/carry) UBE level 3 for 3 min forward and  backward Standing L stretch 5 sec X 10 Blue 2X15 Blue 2X15 ER blue 2X10 IR blue 2X10 Shoulder bilat horizontal abd green 2X10 Bicep curls blue 2X10 Shoulder abd 1# 2X10 bilat   Manual therapy for active compression and skilled palpation during TDN Trigger Point Dry Needling  Subsequent Treatment: education previously provided Pt instructed to continue prescribed HEP. Patient Verbal Consent Given: YES Education Handout Provided: yes Muscles Treated:Left shoulder anterior deltoids and biceps, triceps Electrical Stimulation Performed: NO Treatment Response/Outcome:  good overall tolerance,twitch response noted  08/08/23 Theractivity (strength for push/pull/lift/carry) UBE level 3 for 3 min forward and backward Standing L stretch 5 sec X 10 Rows green 2X15 Shouder extensions green 2X15 ER green 2X15 IR green 2X15 Shoulder bilat horizontal abd green 2X10 Bicep curls 2# 2X10 Shoulder abd 1# 2X10 bilat   Manual therapy for active compression and skilled palpation during TDN Trigger Point Dry Needling  Subsequent Treatment: education previously provided Pt instructed to continue prescribed HEP. Patient Verbal Consent Given: YES Education Handout Provided: yes Muscles Treated:Left shoulder anterior deltoids and biceps Electrical Stimulation Performed: NO Treatment Response/Outcome:  good overall tolerance,twitch response noted   PATIENT EDUCATION: Education details: HEP, PT plan of care, anatomy and education on exam findings and shoulder, impingement/tendinopathy, ways to decrease inflammation, do not push into pain with exercises.  DN rationale and education Person educated: Patient Education method: Explanation, Demonstration, Verbal cues, and Handouts Education comprehension: verbalized understanding and needs further education   HOME EXERCISE PROGRAM: Access Code: [facility] URL: [email or web address] Date: 07/08/2023 Prepared by: Jamee Mazzoni  Exercises - Standing Shoulder Posterior Capsule Stretch  - 2 x daily - 6 x weekly - 1 sets - 5 reps - 10 sec hold - Doorway Pec Stretch at 90 Degrees Abduction  - 2 x daily - 6 x weekly - 1 sets - 5 reps - 10 sec hold - Standing Shoulder Internal Rotation Stretch with Towel (Mirrored)  - 2 x daily - 6 x weekly - 1 sets - 10 reps - 5 sec hold - Standing Shoulder Row with Anchored Resistance  - 2 x daily - 6 x weekly - 2 sets - 10 reps - Shoulder Extension with  Resistance - Neutral  - 2 x daily - 6 x weekly - 2 sets - 10 reps - Supine Shoulder External Rotation with Resistance  - 2 x daily - 6 x weekly - 2 sets - 10 reps - Supine Shoulder Horizontal Abduction with Resistance  - 2 x daily - 6 x weekly - 2 sets - 10 reps  ASSESSMENT:  CLINICAL IMPRESSION:  She had good tolerance to session today, pain was less overall. She continues to show good benefit from DN.  OBJECTIVE IMPAIRMENTS: decreased activity tolerance, decreased shoulder mobility, decreased ROM, decreased strength, impaired flexibility, impaired UE use, and pain.  ACTIVITY LIMITATIONS: reaching, lifting, carry,  cleaning,  PERSONAL FACTORS:  DM also affecting patient's functional outcome.  REHAB POTENTIAL: Good  CLINICAL DECISION MAKING: Stable/uncomplicated  EVALUATION COMPLEXITY: Low    GOALS: Short term PT Goals Target date: 08/05/2023   Pt will be I and compliant with HEP. Baseline:  Goal status:MET 07/24/23 Pt will decrease pain by 25% overall with reaching Baseline: up to 10/10 Goal status: Met 07/24/23  Long term PT goals Target date:09/02/2023   Pt will improve Lt shoulder AROM to [facility] (>145 deg flexion and abduction, >70 ER, >T6 reaching behind back to improve functional reaching Baseline: Goal status: ongoing  but very close to meeting 08/15/23 Pt will improve  Lt shoulder strength to at least 4+/5 MMT to improve functional strength Baseline: Goal status: MET 08/08/23 Pt will improve PSFS to at least  23/30 functional to show improved function Baseline: Goal status: ongoing 08/15/23 Pt will reduce pain to overall less than 3/10 with usual activity and work activity. Baseline: Goal status: some days this has been met, other days pain is 4/10, 08/15/23  PLAN: PT FREQUENCY: 1-2 times per week   PT DURATION: 4-8 weeks  PLANNED INTERVENTIONS (unless contraindicated): aquatic PT, Canalith repositioning, cryotherapy, Electrical stimulation, Iontophoresis with 4 mg/ml dexamethasome, Moist heat, traction, Ultrasound, gait training, Therapeutic exercise, balance training, neuromuscular re-education, patient/family education, manual techniques, passive ROM, dry needling, taping, vasopnuematic device, vestibular, spinal manipulations, joint manipulations 97110-Therapeutic exercises, 97530- Therapeutic activity, 97112- Neuromuscular re-education, 97535- Self Care, 16109- Manual therapy, 97016- Vasopneumatic device, 97035- Ultrasound, 60454- Ionotophoresis 4mg /ml Dexamethasone , and Dry Needling  PLAN FOR NEXT SESSION:   Check for new insurance authorization. continue DN PRN, shoulder strengthening,  Progress as tolerated.    Jamee Mazzoni, PT, DPT 08/19/23 8:40 AM    Date of referral: 06/27/23 Referring provider:Brooks, Dana, DO  Referring diagnosis? M25.512,G89.29 (ICD-10-CM) - Chronic left shoulder pain M67.814 (ICD-10-CM) - Tendinosis of left rotator cuff Treatment diagnosis? (if different than referring diagnosis) m25.512  What was this (referring dx) caused by? Ongoing Issue  Lonne Roan of Condition: Initial Onset (within last 3 months)   Laterality: Lt  Current Functional Measure Score: Other   Patient-Specific Activity Scoring Scheme  "0" represents "unable to perform." "10" represents "able to perform at prior level. 0 1 2 3 4 5 6 7 8 9  10 (Date and Score)  Activity Eval  08/08/23   1. Unhooking bra 4  8  2. Reaching cabinets/drying hair 6 7.5   3. Making bed 7 7  4.    5.     Score 17/30 22.5/30    Total score = sum of the activity scores/number of activities Minimum detectable change (90%CI) for average score = 2 points Minimum detectable change (90%CI) for single activity score = 3 points Objective measurements identify impairments when they are compared to normal values, the uninvolved extremity, and  prior level of function.  [x]  Yes  []  No  Objective assessment of functional ability: Moderate functional limitations   Briefly describe symptoms:  pain in left shoulder with reaching up, out, or behind her limiting her functional activity  How did symptoms start: insidious onset, PT feels shoulder impingement and tendinitis is root cause.  Average pain intensity:  Last 24 hours: 6  Past week: 6-10  How often does the pt experience symptoms? intermittently  How much have the symptoms interfered with usual daily activities? Moderately  How has condition changed since care began at this facility? She has improved about 75%  In general, how is the patients overall health? Good   BACK PAIN (STarT Back Screening Tool) No

## 2023-08-21 ENCOUNTER — Ambulatory Visit: Admitting: Physical Therapy

## 2023-08-21 ENCOUNTER — Encounter: Payer: Self-pay | Admitting: Physical Therapy

## 2023-08-21 DIAGNOSIS — M6281 Muscle weakness (generalized): Secondary | ICD-10-CM | POA: Diagnosis not present

## 2023-08-21 DIAGNOSIS — R600 Localized edema: Secondary | ICD-10-CM | POA: Diagnosis not present

## 2023-08-21 DIAGNOSIS — G8929 Other chronic pain: Secondary | ICD-10-CM

## 2023-08-21 DIAGNOSIS — M25512 Pain in left shoulder: Secondary | ICD-10-CM | POA: Diagnosis not present

## 2023-08-21 NOTE — Therapy (Signed)
 OUTPATIENT PHYSICAL THERAPY SHOULDER TREATMENT     Patient Name: Mercedes Webb MRN: [id number] DOB:10/13/1955, 68 y.o., female Today's Date: 08/21/2023  END OF SESSION:  PT End of Session - 08/21/23 0847     Visit Number 14    Number of Visits 20    Date for PT Re-Evaluation 09/26/23    Authorization Type [employer] MCR 12 visits approaved until 4/21    Authorization - Visit Number 2    Authorization - Number of Visits 4    Progress Note Due on Visit 14    PT Start Time 0800    PT Stop Time 0838    PT Time Calculation (min) 38 min    Activity Tolerance Patient tolerated treatment well    Behavior During Therapy [facility] for tasks assessed/performed                    Past Medical History:  Diagnosis Date   Anxiety    Cancer (HCC) 2016   Skin CA squamous cell on bilateral feet removed   Cervical dysplasia    h/o   Colon polyps    polyps on first scope ~ 2006, no recurrent polyps ~ 2001 and in 2016.    Diabetes mellitus without complication (HCC)    Endometriosis    Family history of adverse reaction to anesthesia    Mother had difficulty waking & nausea   Headache    Hypertension    Insomnia    Pancreatitis 01/2016   Recurrent cold sores    Past Surgical History:  Procedure Laterality Date   ABDOMINAL HYSTERECTOMY     tah/bso- endometriosis   CHOLECYSTECTOMY     ERCP N/A 02/03/2016   Procedure: ENDOSCOPIC RETROGRADE CHOLANGIOPANCREATOGRAPHY (ERCP);  Surgeon: Alvis Jourdain, MD;  Location: [facility];  Service: Endoscopy;  Laterality: N/A;   KNEE ARTHROSCOPY     tonsillectomy     TONSILLECTOMY     TUBAL LIGATION     Patient Active Problem List   Diagnosis Date Noted   Pain in left hip 01/04/2022   Trochanteric bursitis, left hip 11/07/2021   Microalbuminuria due to type 2 diabetes mellitus (HCC) 06/17/2019   Controlled type 2 diabetes mellitus without complication, with long-term current use of insulin  (HCC) 10/23/2018   Secondary hyperhidrosis- not  focal in nature but primarily from waist up is the worst 08/20/2018   Low serum progesterone  05/30/2018   Excessive sweating- likely due to hormonal 11/11/2017   Elevated serum creatinine 09/04/2017   Elevated ALT measurement 09/04/2017   Postmenopausal- 30 yrs ago TAH "yrs ago" 11/12/2016   Hormone imbalance- txed by gyn 11/12/2016   Primary insomnia 11/12/2016   Mood disorder (HCC)- mixed anxiety and depression 11/12/2016   Vitamin D  deficiency 09/28/2016   Mixed diabetic hyperlipidemia associated with type 2 diabetes mellitus (HCC) 09/28/2016   Hypokalemia 02/01/2016   Hypertension associated with diabetes (HCC) 01/31/2016   Generalized anxiety disorder 01/31/2016   Surgical menopause 12/23/2014   Status post abdominal hysterectomy 12/23/2014   History of cervical dysplasia 12/23/2014   SUI (stress urinary incontinence, female) 12/23/2014   Obesity, Class I, BMI 30-34.9 12/23/2014    PCP: Noreene Bearded, PA   REFERRING PROVIDER: Shauna Del, DO   REFERRING DIAG: [phone] (ICD-10-CM) - Chronic left shoulder pain M67.814 (ICD-10-CM) - Tendinosis of left rotator cuff  THERAPY DIAG:  Chronic left shoulder pain  Muscle weakness (generalized)  Localized edema  Rationale for Evaluation and Treatment: Rehabilitation  ONSET DATE: pain since  November 2024 SUBJECTIVE:                                                                                                                                                                                      SUBJECTIVE STATEMENT: Relays the elbow pain she was having is gone today, does have some pain in anterior shoulder, bicep area today.   Hand dominance: Right  PERTINENT HISTORY: DM  PAIN:  NPRS scale: 3 currently/10  Pain location:left shoulder Pain description: intermittent, sharp Aggravating factors: raising arm, reaching, making bed, unhooking bra, cleaning Relieving factors: rest, meds  PRECAUTIONS: None  RED  FLAGS: None   WEIGHT BEARING RESTRICTIONS: No  FALLS:  Has patient fallen in last 6 months? No  OCCUPATION: retired  PLOF: Independent  PATIENT GOALS:reduce pain  NEXT MD VISIT:   OBJECTIVE:  Note: Objective measures were completed at Evaluation unless otherwise noted.  DIAGNOSTIC FINDINGS:  06/28/23 MRI IMPRESSION: 1. Mild supraspinatus and subscapularis tendinosis. 2. Moderate tendinosis of the intra-articular portion of the long head of the biceps tendon  PATIENT SURVEYS:  Patient-Specific Activity Scoring Scheme  "0" represents "unable to perform." "10" represents "able to perform at prior level. 0 1 2 3 4 5 6 7 8 9  10 (Date and Score)   Activity Eval  08/08/23   1. Unhooking bra 4  8  2. Reaching cabinets/drying hair 6 7.5   3. Making bed 7 7  4.    5.    Score 17/30 22.5/30   Total score = sum of the activity scores/number of activities Minimum detectable change (90%CI) for average score = 2 points Minimum detectable change (90%CI) for single activity score = 3 points     COGNITION: Overall cognitive status: Within functional limits for tasks assessed     SENSATION: [facility]   UPPER EXTREMITY ROM:   Active ROM left eval Left 07/16/23 Left 07/31/23 Left 08/08/23 Left 08/15/23  Shoulder flexion 135 150 160 WFL WFL  Shoulder extension       Shoulder abduction 90 90 125 130 140  Shoulder adduction       Shoulder internal rotation L3 behind back L2 behind back  T12 reaching behind back T10 reaching behind back  Shoulder external rotation 60 80     Elbow flexion       Elbow extension       Wrist flexion       Wrist extension       Wrist ulnar deviation       Wrist radial deviation       Wrist pronation       Wrist supination       (  Blank rows = not tested)  UPPER EXTREMITY MMT:  MMT Left Eval Left 08/08/23  Shoulder flexion 4 5  Shoulder extension 3+   Shoulder abduction 4 5  Shoulder adduction    Shoulder internal rotation 4+ 5  Shoulder  external rotation 3+ 5  Middle trapezius 3+   Lower trapezius 3+   Elbow flexion    Elbow extension    Wrist flexion    Wrist extension    Wrist ulnar deviation    Wrist radial deviation    Wrist pronation    Wrist supination    Grip strength (lbs)    (Blank rows = not tested)                                                                                                                              TODAY'S TREATMENT:  08/21/23 Theractivity (strength for push/pull/lift/carry) UBE level 3 for 3 min forward and backward Pulleys 2 min flexion, 2 min scaption  green 2X15 green 2X15 ER green 2X15 IR green  2X15 Shoulder bilat horizontal abd green 2X15 Bicep curls green 2X15 Shoulder abd green X10 bilat Standing tricep extension green 2X10   Manual therapy for active compression and skilled palpation during TDN, IASTM Trigger Point Dry Needling  Subsequent Treatment: education previously provided Pt instructed to continue prescribed HEP. Patient Verbal Consent Given: YES Education Handout Provided: yes Muscles Treated:Left shoulder anterior deltoids, biceps (proximal and mid) Electrical Stimulation Performed: NO Treatment Response/Outcome:  good overall tolerance,twitch response noted  08/19/23 Theractivity (strength for push/pull/lift/carry) UBE level 3 for 3 min forward and backward Blue 2X15 Blue 2X15 ER green 2X15 IR green  2X15 Shoulder bilat horizontal abd green 2X15 Bicep curls green 2X15 Shoulder abd green X10 bilat Standing tricep extension green 2X10   Manual therapy for active compression and skilled palpation during TDN Trigger Point Dry Needling  Subsequent Treatment: education previously provided Pt instructed to continue prescribed HEP. Patient Verbal Consent Given: YES Education Handout Provided: yes Muscles Treated:Left shoulder posterior deltoids, triceps (proximal and distal) Electrical Stimulation Performed: NO Treatment Response/Outcome:  good  overall tolerance,twitch response noted  08/15/23 Theractivity (strength for push/pull/lift/carry) UBE level 3 for 3 min forward and backward Blue 2X15 Blue 2X15 ER blue 2X10 IR blue 2X10 Shoulder bilat horizontal abd green 2X10 Bicep curls blue 2X10 Shoulder abd 1# 2X10 bilat Standing tricep extension green 2X10   Manual therapy for active compression and skilled palpation during TDN Trigger Point Dry Needling  Subsequent Treatment: education previously provided Pt instructed to continue prescribed HEP. Patient Verbal Consent Given: YES Education Handout Provided: yes Muscles Treated:Left shoulder posterior deltoids, triceps (proximal and distal) Electrical Stimulation Performed: NO Treatment Response/Outcome:  good overall tolerance,twitch response noted    PATIENT EDUCATION: Education details: HEP, PT plan of care, anatomy and education on exam findings and shoulder, impingement/tendinopathy, ways to decrease inflammation, do not push into pain with exercises.  DN rationale and education Person educated: Patient  Education method: Explanation, Demonstration, Verbal cues, and Handouts Education comprehension: verbalized understanding and needs further education   HOME EXERCISE PROGRAM: Access Code: [facility] URL: [email or web address] Date: 07/08/2023 Prepared by: Jamee Mazzoni  Exercises - Standing Shoulder Posterior Capsule Stretch  - 2 x daily - 6 x weekly - 1 sets - 5 reps - 10 sec hold - Doorway Pec Stretch at 90 Degrees Abduction  - 2 x daily - 6 x weekly - 1 sets - 5 reps - 10 sec hold - Standing Shoulder Internal Rotation Stretch with Towel (Mirrored)  - 2 x daily - 6 x weekly - 1 sets - 10 reps - 5 sec hold - Standing Shoulder Row with Anchored Resistance  - 2 x daily - 6 x weekly - 2 sets - 10 reps - Shoulder Extension with Resistance - Neutral  - 2 x daily - 6 x weekly - 2 sets - 10 reps - Supine Shoulder External Rotation with Resistance  - 2 x  daily - 6 x weekly - 2 sets - 10 reps - Supine Shoulder Horizontal Abduction with Resistance  - 2 x daily - 6 x weekly - 2 sets - 10 reps  ASSESSMENT:  CLINICAL IMPRESSION:  She had good response from DN to triceps and this pain has resolved today so focused this treatment to bicep and anterior deltoid today where she was still having pain.   OBJECTIVE IMPAIRMENTS: decreased activity tolerance, decreased shoulder mobility, decreased ROM, decreased strength, impaired flexibility, impaired UE use, and pain.  ACTIVITY LIMITATIONS: reaching, lifting, carry,  cleaning,  PERSONAL FACTORS:  DM also affecting patient's functional outcome.  REHAB POTENTIAL: Good  CLINICAL DECISION MAKING: Stable/uncomplicated  EVALUATION COMPLEXITY: Low    GOALS: Short term PT Goals Target date: 08/05/2023   Pt will be I and compliant with HEP. Baseline:  Goal status:MET 07/24/23 Pt will decrease pain by 25% overall with reaching Baseline: up to 10/10 Goal status: Met 07/24/23  Long term PT goals Target date:09/02/2023   Pt will improve Lt shoulder AROM to [facility] (>145 deg flexion and abduction, >70 ER, >T6 reaching behind back to improve functional reaching Baseline: Goal status: ongoing  but very close to meeting 08/15/23 Pt will improve  Lt shoulder strength to at least 4+/5 MMT to improve functional strength Baseline: Goal status: MET 08/08/23 Pt will improve PSFS to at least 23/30 functional to show improved function Baseline: Goal status: ongoing 08/15/23 Pt will reduce pain to overall less than 3/10 with usual activity and work activity. Baseline: Goal status: some days this has been met, other days pain is 4/10, 08/15/23  PLAN: PT FREQUENCY: 1-2 times per week   PT DURATION: 4-8 weeks  PLANNED INTERVENTIONS (unless contraindicated): aquatic PT, Canalith repositioning, cryotherapy, Electrical stimulation, Iontophoresis with 4 mg/ml dexamethasome, Moist heat, traction, Ultrasound, gait training,  Therapeutic exercise, balance training, neuromuscular re-education, patient/family education, manual techniques, passive ROM, dry needling, taping, vasopnuematic device, vestibular, spinal manipulations, joint manipulations 97110-Therapeutic exercises, 97530- Therapeutic activity, 97112- Neuromuscular re-education, 97535- Self Care, 16109- Manual therapy, 97016- Vasopneumatic device, 97035- Ultrasound, 60454- Ionotophoresis 4mg /ml Dexamethasone , and Dry Needling  PLAN FOR NEXT SESSION:   2 more visits authorized.     Jamee Mazzoni, PT, DPT 08/21/23 8:50 AM    Date of referral: 06/27/23 Referring provider:Brooks, Dana, DO  Referring diagnosis? M25.512,G89.29 (ICD-10-CM) - Chronic left shoulder pain M67.814 (ICD-10-CM) - Tendinosis of left rotator cuff Treatment diagnosis? (if different than referring diagnosis) m25.512  What was this (referring dx) caused by? Ongoing  Issue  Nature of Condition: Initial Onset (within last 3 months)   Laterality: Lt  Current Functional Measure Score: Other   Patient-Specific Activity Scoring Scheme  "0" represents "unable to perform." "10" represents "able to perform at prior level. 0 1 2 3 4 5 6 7 8 9  10 (Date and Score)  Activity Eval  08/08/23   1. Unhooking bra 4  8  2. Reaching cabinets/drying hair 6 7.5   3. Making bed 7 7  4.    5.    Score 17/30 22.5/30    Total score = sum of the activity scores/number of activities Minimum detectable change (90%CI) for average score = 2 points Minimum detectable change (90%CI) for single activity score = 3 points Objective measurements identify impairments when they are compared to normal values, the uninvolved extremity, and prior level of function.  [x]  Yes  []  No  Objective assessment of functional ability: Moderate functional limitations   Briefly describe symptoms:  pain in left shoulder with reaching up, out, or behind her limiting her functional activity  How did symptoms start:  insidious onset, PT feels shoulder impingement and tendinitis is root cause.  Average pain intensity:  Last 24 hours: 6  Past week: 6-10  How often does the pt experience symptoms? intermittently  How much have the symptoms interfered with usual daily activities? Moderately  How has condition changed since care began at this facility? She has improved about 75%  In general, how is the patients overall health? Good   BACK PAIN (STarT Back Screening Tool) No

## 2023-08-28 ENCOUNTER — Encounter: Payer: Self-pay | Admitting: Physical Therapy

## 2023-08-28 ENCOUNTER — Ambulatory Visit: Admitting: Physical Therapy

## 2023-08-28 DIAGNOSIS — R600 Localized edema: Secondary | ICD-10-CM

## 2023-08-28 DIAGNOSIS — M25512 Pain in left shoulder: Secondary | ICD-10-CM | POA: Diagnosis not present

## 2023-08-28 DIAGNOSIS — M6281 Muscle weakness (generalized): Secondary | ICD-10-CM

## 2023-08-28 DIAGNOSIS — G8929 Other chronic pain: Secondary | ICD-10-CM

## 2023-08-28 NOTE — Therapy (Signed)
 OUTPATIENT PHYSICAL THERAPY SHOULDER TREATMENT/discharge PHYSICAL THERAPY DISCHARGE SUMMARY  Visits from Start of Care: 15  Current functional level related to goals / functional outcomes: See below   Remaining deficits: See below   Education / Equipment: HEP  Plan:  Patient goals were met and she feels ready to discharge        Patient Name: Mercedes Webb MRN: [id number] DOB:08/04/1955, 68 y.o., female Today's Date: 08/28/2023  END OF SESSION:  PT End of Session - 08/28/23 1419     Visit Number 15    Number of Visits 20    Date for PT Re-Evaluation 09/26/23    Authorization Type [employer] MCR 12 visits approaved until 4/21    Authorization - Visit Number 3    Authorization - Number of Visits 4    Progress Note Due on Visit 14    PT Start Time 1335    PT Stop Time 1415    PT Time Calculation (min) 40 min    Activity Tolerance Patient tolerated treatment well    Behavior During Therapy [facility] for tasks assessed/performed                     Past Medical History:  Diagnosis Date   Anxiety    Cancer (HCC) 2016   Skin CA squamous cell on bilateral feet removed   Cervical dysplasia    h/o   Colon polyps    polyps on first scope ~ 2006, no recurrent polyps ~ 2001 and in 2016.    Diabetes mellitus without complication (HCC)    Endometriosis    Family history of adverse reaction to anesthesia    Mother had difficulty waking & nausea   Headache    Hypertension    Insomnia    Pancreatitis 01/2016   Recurrent cold sores    Past Surgical History:  Procedure Laterality Date   ABDOMINAL HYSTERECTOMY     tah/bso- endometriosis   CHOLECYSTECTOMY     ERCP N/A 02/03/2016   Procedure: ENDOSCOPIC RETROGRADE CHOLANGIOPANCREATOGRAPHY (ERCP);  Surgeon: Alvis Jourdain, MD;  Location: [facility] ENDOSCOPY;  Service: Endoscopy;  Laterality: N/A;   KNEE ARTHROSCOPY     tonsillectomy     TONSILLECTOMY     TUBAL LIGATION     Patient Active Problem List   Diagnosis Date  Noted   Pain in left hip 01/04/2022   Trochanteric bursitis, left hip 11/07/2021   Microalbuminuria due to type 2 diabetes mellitus (HCC) 06/17/2019   Controlled type 2 diabetes mellitus without complication, with long-term current use of insulin  (HCC) 10/23/2018   Secondary hyperhidrosis- not focal in nature but primarily from waist up is the worst 08/20/2018   Low serum progesterone  05/30/2018   Excessive sweating- likely due to hormonal 11/11/2017   Elevated serum creatinine 09/04/2017   Elevated ALT measurement 09/04/2017   Postmenopausal- 30 yrs ago TAH "yrs ago" 11/12/2016   Hormone imbalance- txed by gyn 11/12/2016   Primary insomnia 11/12/2016   Mood disorder (HCC)- mixed anxiety and depression 11/12/2016   Vitamin D  deficiency 09/28/2016   Mixed diabetic hyperlipidemia associated with type 2 diabetes mellitus (HCC) 09/28/2016   Hypokalemia 02/01/2016   Hypertension associated with diabetes (HCC) 01/31/2016   Generalized anxiety disorder 01/31/2016   Surgical menopause 12/23/2014   Status post abdominal hysterectomy 12/23/2014   History of cervical dysplasia 12/23/2014   SUI (stress urinary incontinence, female) 12/23/2014   Obesity, Class I, BMI 30-34.9 12/23/2014    PCP: Noreene Bearded, PA  REFERRING PROVIDER: Shauna Del, DO   REFERRING DIAG: [phone] (ICD-10-CM) - Chronic left shoulder pain M67.814 (ICD-10-CM) - Tendinosis of left rotator cuff  THERAPY DIAG:  Chronic left shoulder pain  Muscle weakness (generalized)  Localized edema  Rationale for Evaluation and Treatment: Rehabilitation  ONSET DATE: pain since November 2024 SUBJECTIVE:                                                                                                                                                                                      SUBJECTIVE STATEMENT: Relays her shoulder is going good, no more than 3/10 pain, feels ready to discharge today  Hand dominance:  Right  PERTINENT HISTORY: DM  PAIN:  NPRS scale: 3 currently/10  Pain location:left shoulder Pain description: intermittent, sharp Aggravating factors: raising arm, reaching, making bed, unhooking bra, cleaning Relieving factors: rest, meds  PRECAUTIONS: None  RED FLAGS: None   WEIGHT BEARING RESTRICTIONS: No  FALLS:  Has patient fallen in last 6 months? No  OCCUPATION: retired  PLOF: Independent  PATIENT GOALS:reduce pain  NEXT MD VISIT:   OBJECTIVE:  Note: Objective measures were completed at Evaluation unless otherwise noted.  DIAGNOSTIC FINDINGS:  06/28/23 MRI IMPRESSION: 1. Mild supraspinatus and subscapularis tendinosis. 2. Moderate tendinosis of the intra-articular portion of the long head of the biceps tendon  PATIENT SURVEYS:  Patient-Specific Activity Scoring Scheme  "0" represents "unable to perform." "10" represents "able to perform at prior level. 0 1 2 3 4 5 6 7 8 9  10 (Date and Score)   Activity Eval  08/08/23  08/28/23  1. Unhooking bra 4  8 8   2. Reaching cabinets/drying hair 6 7.5  8  3. Making bed 7 7 8   4.     5.     Score 17/30 22.5/30 24/30   Total score = sum of the activity scores/number of activities Minimum detectable change (90%CI) for average score = 2 points Minimum detectable change (90%CI) for single activity score = 3 points     COGNITION: Overall cognitive status: Within functional limits for tasks assessed     SENSATION: [facility]   UPPER EXTREMITY ROM:   Active ROM left eval Left 07/16/23 Left 07/31/23 Left 08/08/23 Left 08/15/23 Left 08/28/23   Shoulder flexion 135 150 160 WFL WFL WFL  Shoulder extension        Shoulder abduction 90 90 125 130 140 WFL  Shoulder adduction        Shoulder internal rotation L3 behind back L2 behind back  T12 reaching behind back T10 reaching behind back [facility]  Shoulder external rotation 60 80    WFL  Elbow flexion  Elbow extension        Wrist flexion        Wrist extension         Wrist ulnar deviation        Wrist radial deviation        Wrist pronation        Wrist supination        (Blank rows = not tested)  UPPER EXTREMITY MMT:  MMT Left Eval Left 08/08/23  Shoulder flexion 4 5  Shoulder extension 3+   Shoulder abduction 4 5  Shoulder adduction    Shoulder internal rotation 4+ 5  Shoulder external rotation 3+ 5  Middle trapezius 3+   Lower trapezius 3+   Elbow flexion    Elbow extension    Wrist flexion    Wrist extension    Wrist ulnar deviation    Wrist radial deviation    Wrist pronation    Wrist supination    Grip strength (lbs)    (Blank rows = not tested)                                                                                                                              TODAY'S TREATMENT:  08/28/23 Theractivity (strength for push/pull/lift/carry) UBE level 3 for 3 min forward and backward Pulleys 2 min flexion, 2 min scaption  green 2X15 green 2X15 ER green 2X15 IR green  2X15 Shoulder bilat horizontal abd green 2X15 Standing shoulder flexion 2X10 Bicep curls green 2X15 Shoulder abd green X10 bilat Standing tricep extension green 2X10   Manual therapy for active compression and skilled palpation during TDN, IASTM Trigger Point Dry Needling  Subsequent Treatment: education previously provided Pt instructed to continue prescribed HEP. Patient Verbal Consent Given: YES Education Handout Provided: yes Muscles Treated:Left shoulder anterior and posterior deltoids, biceps (proximal and mid) Electrical Stimulation Performed: NO Treatment Response/Outcome:  good overall tolerance,twitch response noted  08/21/23 Theractivity (strength for push/pull/lift/carry) UBE level 3 for 3 min forward and backward Pulleys 2 min flexion, 2 min scaption  green 2X15 green 2X15 ER green 2X15 IR green  2X15 Shoulder bilat horizontal abd green 2X15 Bicep curls green 2X15 Shoulder abd green X10 bilat Standing tricep extension green  2X10   Manual therapy for active compression and skilled palpation during TDN, IASTM Trigger Point Dry Needling  Subsequent Treatment: education previously provided Pt instructed to continue prescribed HEP. Patient Verbal Consent Given: YES Education Handout Provided: yes Muscles Treated:Left shoulder anterior deltoids, biceps (proximal and mid) Electrical Stimulation Performed: NO Treatment Response/Outcome:  good overall tolerance,twitch response noted  08/19/23 Theractivity (strength for push/pull/lift/carry) UBE level 3 for 3 min forward and backward Blue 2X15 Blue 2X15 ER green 2X15 IR green  2X15 Shoulder bilat horizontal abd green 2X15 Bicep curls green 2X15 Shoulder abd green X10 bilat Standing tricep extension green 2X10   Manual therapy for active compression and skilled palpation during TDN Trigger Point Dry Needling  Subsequent Treatment: education previously provided Pt instructed to continue prescribed HEP. Patient Verbal Consent Given: YES Education Handout Provided: yes Muscles Treated:Left shoulder posterior deltoids, triceps (proximal and distal) Electrical Stimulation Performed: NO Treatment Response/Outcome:  good overall tolerance,twitch response noted  08/15/23 Theractivity (strength for push/pull/lift/carry) UBE level 3 for 3 min forward and backward Blue 2X15 Blue 2X15 ER blue 2X10 IR blue 2X10 Shoulder bilat horizontal abd green 2X10 Bicep curls blue 2X10 Shoulder abd 1# 2X10 bilat Standing tricep extension green 2X10   Manual therapy for active compression and skilled palpation during TDN Trigger Point Dry Needling  Subsequent Treatment: education previously provided Pt instructed to continue prescribed HEP. Patient Verbal Consent Given: YES Education Handout Provided: yes Muscles Treated:Left shoulder posterior deltoids, triceps (proximal and distal) Electrical Stimulation Performed: NO Treatment Response/Outcome:  good overall  tolerance,twitch response noted    PATIENT EDUCATION: Education details: HEP, PT plan of care, anatomy and education on exam findings and shoulder, impingement/tendinopathy, ways to decrease inflammation, do not push into pain with exercises.  DN rationale and education Person educated: Patient Education method: Explanation, Demonstration, Verbal cues, and Handouts Education comprehension: verbalized understanding and needs further education   HOME EXERCISE PROGRAM: Access Code: [facility] URL: [email or web address] Date: 07/08/2023 Prepared by: Jamee Mazzoni  Exercises - Standing Shoulder Posterior Capsule Stretch  - 2 x daily - 6 x weekly - 1 sets - 5 reps - 10 sec hold - Doorway Pec Stretch at 90 Degrees Abduction  - 2 x daily - 6 x weekly - 1 sets - 5 reps - 10 sec hold - Standing Shoulder Internal Rotation Stretch with Towel (Mirrored)  - 2 x daily - 6 x weekly - 1 sets - 10 reps - 5 sec hold - Standing Shoulder Row with Anchored Resistance  - 2 x daily - 6 x weekly - 2 sets - 10 reps - Shoulder Extension with Resistance - Neutral  - 2 x daily - 6 x weekly - 2 sets - 10 reps - Supine Shoulder External Rotation with Resistance  - 2 x daily - 6 x weekly - 2 sets - 10 reps - Supine Shoulder Horizontal Abduction with Resistance  - 2 x daily - 6 x weekly - 2 sets - 10 reps  ASSESSMENT:  CLINICAL IMPRESSION:  Overall made good progress with PT. She met her PT goals and she feels ready to discharge from PT. She will continue with HEP.   OBJECTIVE IMPAIRMENTS: decreased activity tolerance, decreased shoulder mobility, decreased ROM, decreased strength, impaired flexibility, impaired UE use, and pain.  ACTIVITY LIMITATIONS: reaching, lifting, carry,  cleaning,  PERSONAL FACTORS:  DM also affecting patient's functional outcome.  REHAB POTENTIAL: Good  CLINICAL DECISION MAKING: Stable/uncomplicated  EVALUATION COMPLEXITY: Low    GOALS: Short term PT Goals Target  date: 08/05/2023   Pt will be I and compliant with HEP. Baseline:  Goal status:MET 07/24/23 Pt will decrease pain by 25% overall with reaching Baseline: up to 10/10 Goal status: Met 07/24/23  Long term PT goals Target date:09/02/2023   Pt will improve Lt shoulder AROM to [facility] (>145 deg flexion and abduction, >70 ER, >T6 reaching behind back to improve functional reaching Baseline: Goal status: MET 08/28/23 Pt will improve  Lt shoulder strength to at least 4+/5 MMT to improve functional strength Baseline: Goal status: MET 08/08/23 Pt will improve PSFS to at least 23/30 functional to show improved function Baseline: Goal status: MET 08/28/23 Pt will reduce pain to overall less than 3/10 with usual  activity and work activity. Baseline: Goal status: Met as of last week 08/28/23  PLAN: PT FREQUENCY: 1-2 times per week   PT DURATION: 4-8 weeks  PLANNED INTERVENTIONS (unless contraindicated): aquatic PT, Canalith repositioning, cryotherapy, Electrical stimulation, Iontophoresis with 4 mg/ml dexamethasome, Moist heat, traction, Ultrasound, gait training, Therapeutic exercise, balance training, neuromuscular re-education, patient/family education, manual techniques, passive ROM, dry needling, taping, vasopnuematic device, vestibular, spinal manipulations, joint manipulations 97110-Therapeutic exercises, 97530- Therapeutic activity, 97112- Neuromuscular re-education, 97535- Self Care, 28413- Manual therapy, 97016- Vasopneumatic device, [id number]- Ultrasound, 24401- Ionotophoresis 4mg /ml Dexamethasone , and Dry Needling  PLAN FOR NEXT SESSION:   DC today    Jamee Mazzoni, PT, DPT 08/28/23 2:20 PM    Date of referral: 06/27/23 Referring provider:Brooks, Bernabe Brew, DO  Referring diagnosis? M25.512,G89.29 (ICD-10-CM) - Chronic left shoulder pain M67.814 (ICD-10-CM) - Tendinosis of left rotator cuff Treatment diagnosis? (if different than referring diagnosis) m25.512  What was this (referring dx) caused  by? Ongoing Issue  Lonne Roan of Condition: Initial Onset (within last 3 months)   Laterality: Lt  Current Functional Measure Score: Other   Patient-Specific Activity Scoring Scheme  "0" represents "unable to perform." "10" represents "able to perform at prior level. 0 1 2 3 4 5 6 7 8 9  10 (Date and Score)  Activity Eval  08/08/23   1. Unhooking bra 4  8  2. Reaching cabinets/drying hair 6 7.5   3. Making bed 7 7  4.    5.    Score 17/30 22.5/30    Total score = sum of the activity scores/number of activities Minimum detectable change (90%CI) for average score = 2 points Minimum detectable change (90%CI) for single activity score = 3 points Objective measurements identify impairments when they are compared to normal values, the uninvolved extremity, and prior level of function.  [x]  Yes  []  No  Objective assessment of functional ability: Moderate functional limitations   Briefly describe symptoms:  pain in left shoulder with reaching up, out, or behind her limiting her functional activity  How did symptoms start: insidious onset, PT feels shoulder impingement and tendinitis is root cause.  Average pain intensity:  Last 24 hours: 6  Past week: 6-10  How often does the pt experience symptoms? intermittently  How much have the symptoms interfered with usual daily activities? Moderately  How has condition changed since care began at this facility? She has improved about 75%  In general, how is the patients overall health? Good   BACK PAIN (STarT Back Screening Tool) No

## 2023-09-24 ENCOUNTER — Other Ambulatory Visit: Payer: Self-pay | Admitting: Family Medicine

## 2023-09-24 DIAGNOSIS — E66811 Obesity, class 1: Secondary | ICD-10-CM

## 2023-09-24 DIAGNOSIS — E1169 Type 2 diabetes mellitus with other specified complication: Secondary | ICD-10-CM

## 2023-09-24 DIAGNOSIS — E1159 Type 2 diabetes mellitus with other circulatory complications: Secondary | ICD-10-CM

## 2023-10-03 ENCOUNTER — Other Ambulatory Visit: Payer: Self-pay | Admitting: Family Medicine

## 2023-10-03 DIAGNOSIS — B009 Herpesviral infection, unspecified: Secondary | ICD-10-CM

## 2023-10-09 ENCOUNTER — Telehealth: Payer: Self-pay

## 2023-10-09 NOTE — Telephone Encounter (Signed)
 Called pt unable to schedule at this time, Pt will call back to reschedule once she is at home with her calendar

## 2023-10-09 NOTE — Telephone Encounter (Signed)
 Copied from CRM [id number]. Topic: Appointments - Appointment Cancel/Reschedule >> Oct 09, 2023  8:31 AM Ivette P wrote: Patient/patient representative is calling to cancel or reschedule an appointment. Refer to attachments for appointment information.    Pt called in about her AWV, someone scheduled and pt stated that she was not aware about this visit and would like for someone to call her to reschedule her AWV.   Pt called in to cancel the AWV     Pt callback [phone]

## 2023-10-17 ENCOUNTER — Other Ambulatory Visit: Payer: Self-pay | Admitting: Family Medicine

## 2023-10-17 DIAGNOSIS — Z794 Long term (current) use of insulin: Secondary | ICD-10-CM

## 2023-10-17 DIAGNOSIS — E119 Type 2 diabetes mellitus without complications: Secondary | ICD-10-CM

## 2023-10-31 ENCOUNTER — Other Ambulatory Visit: Payer: Self-pay | Admitting: Family Medicine

## 2023-10-31 DIAGNOSIS — E1169 Type 2 diabetes mellitus with other specified complication: Secondary | ICD-10-CM

## 2023-11-27 ENCOUNTER — Other Ambulatory Visit: Payer: Self-pay

## 2023-11-27 DIAGNOSIS — Z1231 Encounter for screening mammogram for malignant neoplasm of breast: Secondary | ICD-10-CM

## 2023-11-29 ENCOUNTER — Ambulatory Visit (INDEPENDENT_AMBULATORY_CARE_PROVIDER_SITE_OTHER)

## 2023-11-29 VITALS — BP 122/77 | HR 83 | Temp 97.5°F | Ht 67.0 in | Wt 170.0 lb

## 2023-11-29 DIAGNOSIS — E119 Type 2 diabetes mellitus without complications: Secondary | ICD-10-CM

## 2023-11-29 DIAGNOSIS — F39 Unspecified mood [affective] disorder: Secondary | ICD-10-CM

## 2023-11-29 DIAGNOSIS — E781 Pure hyperglyceridemia: Secondary | ICD-10-CM | POA: Diagnosis not present

## 2023-11-29 DIAGNOSIS — I152 Hypertension secondary to endocrine disorders: Secondary | ICD-10-CM

## 2023-11-29 DIAGNOSIS — E1169 Type 2 diabetes mellitus with other specified complication: Secondary | ICD-10-CM | POA: Diagnosis not present

## 2023-11-29 DIAGNOSIS — F5101 Primary insomnia: Secondary | ICD-10-CM

## 2023-11-29 DIAGNOSIS — E782 Mixed hyperlipidemia: Secondary | ICD-10-CM

## 2023-11-29 DIAGNOSIS — Z794 Long term (current) use of insulin: Secondary | ICD-10-CM

## 2023-11-29 DIAGNOSIS — E1159 Type 2 diabetes mellitus with other circulatory complications: Secondary | ICD-10-CM | POA: Diagnosis not present

## 2023-11-29 LAB — POCT GLYCOSYLATED HEMOGLOBIN (HGB A1C): Hemoglobin A1C: 5.1 % (ref 4.0–5.6)

## 2023-11-29 MED ORDER — TIRZEPATIDE 12.5 MG/0.5ML ~~LOC~~ SOAJ: 12.5000 mg | SUBCUTANEOUS | 2 refills | Status: DC

## 2023-11-29 NOTE — Assessment & Plan Note (Signed)
 Insomnia managed with Ambien  at night prescribed by psychiatry. No issues with morning drowsiness reported.

## 2023-11-29 NOTE — Assessment & Plan Note (Addendum)
 POC A1c today: 5.1 Decrease Humulin  to 3 units in the morning after increasing Mounjaro  to 12.5 mg weekly. - Continue current regimen until the new Mounjaro  dose is available. - Monitor blood glucose levels and adjust insulin  as needed. - Contact the office if blood glucose levels become unstable

## 2023-11-29 NOTE — Assessment & Plan Note (Signed)
BP goal <130/80. Stable, at goal.  Continue losartan and triamterene-hydrochlorothiazide 37.5-25 mg daily.  Will continue to monitor.

## 2023-11-29 NOTE — Assessment & Plan Note (Signed)
 Anxiety and depression previously managed with venlafaxine , discontinued due to side effects. Currently not on psychiatric medications. Considering changes in psychiatric care due to potential insurance issues. - Consider taking over psychiatric medication management if current psychiatrist stops accepting insurance.

## 2023-11-29 NOTE — Assessment & Plan Note (Signed)
 Hyperlipidemia managed with fenofibrate  and atorvastatin . Tolerating medications well. Rechecking lipid panel today.

## 2023-11-29 NOTE — Progress Notes (Signed)
 Established Patient Office Visit  Subjective   Patient ID: Mercedes Webb, female    DOB: [birth year]  Age: 68 y.o. MRN: [id number]  Chief Complaint  Patient presents with   Medical Management of Chronic Issues    Transfer of care    HPI History of Present Illness Lillyanne Bradburn is a 68 year old female with diabetes who presents for follow-up on her diabetes management and hip pain.  Musculoskeletal pain - Persistent bilateral hip and shoulder pain for several years - Scheduled to see orthopedic specialist on August 11th - Received multiple cortisone injections in the groin area; most recent injection was more painful and did not provide relief  Glycemic control - Diabetes mellitus with prior hospitalization in 2022 for DKA - Currently taking 7 units of Humulin  in the morning and Mounjaro  once weekly - Actively reducing Humulin  dose while increasing Mounjaro , with goal to potentially discontinue morning insulin  - Last medication adjustment: Humulin  reduced to 7 units, Mounjaro  increased to 10 mg - No tingling in feet - Regular bowel movements - Occasional color changes in feet depending on position - Maintaining a healthy diet by avoiding loaf bread and sweets  Hypertension and hyperlipidemia management - Blood pressure managed with losartan  and Maxzide  (triamterene /hydrochlorothiazide ) - Cholesterol managed with fenofibrate  and atorvastatin   Sleep disturbance - Takes Ambien  for sleep  Viral suppression - Takes Valtrex  daily  Mood symptoms - History of anxiety and depression - Previously managed with venlafaxine , now discontinued - Underwent genetic testing to guide psychiatric medication management - Currently not taking psychiatric medications - Continues to see psychiatrist for medication management; possible changes in insurance coverage  Screenings - Mammogram scheduled for September - Eye exam scheduled for October      ROS Per HPI.     Objective:     BP 122/77   Pulse 83   Temp (!) 97.5 F (36.4 C) (Oral)   Ht 5' 7 (1.702 m)   Wt 170 lb (77.1 kg)   SpO2 98%   BMI 26.63 kg/m    Physical Exam Constitutional:      General: She is not in acute distress.    Appearance: Normal appearance.  HENT:     Right Ear: Tympanic membrane normal.     Left Ear: Tympanic membrane normal.     Mouth/Throat:     Mouth: Mucous membranes are moist.     Pharynx: Oropharynx is clear.  Eyes:     Pupils: Pupils are equal, round, and reactive to light.  Cardiovascular:     Rate and Rhythm: Normal rate and regular rhythm.     Heart sounds: Normal heart sounds. No murmur heard.    No friction rub. No gallop.  Pulmonary:     Effort: Pulmonary effort is normal. No respiratory distress.     Breath sounds: Normal breath sounds.  Abdominal:     General: Abdomen is flat. Bowel sounds are normal.     Palpations: Abdomen is soft.  Musculoskeletal:        General: No swelling.     Cervical back: Normal range of motion.  Lymphadenopathy:     Cervical: No cervical adenopathy.  Skin:    General: Skin is warm and dry.  Neurological:     General: No focal deficit present.     Mental Status: She is alert.  Psychiatric:        Mood and Affect: Mood normal.        Behavior: Behavior normal.  Thought Content: Thought content normal.      Results for orders placed or performed in visit on 11/29/23  POCT HgB A1C  Result Value Ref Range   Hemoglobin A1C 5.1 4.0 - 5.6 %   HbA1c POC (<> result, manual entry)     HbA1c, POC (prediabetic range)     HbA1c, POC (controlled diabetic range)        The ASCVD Risk score (Arnett DK, et al., 2019) failed to calculate for the following reasons:   The valid total cholesterol range is 130 to 320 mg/dL    Assessment & Plan:   Type 2 diabetes mellitus with other specified complication, without long-term current use of insulin  (HCC) -     POCT glycosylated hemoglobin (Hb  A1C)  Hypertriglyceridemia -     Comprehensive metabolic panel with GFR -     Lipid panel  Hypertension associated with diabetes (HCC) Assessment & Plan: BP goal <130/80. Stable, at goal.  Continue losartan  and triamterene -hydrochlorothiazide  37.5-25 mg daily.  Will continue to monitor.   Orders: -     CBC with Differential/Platelet -     Comprehensive metabolic panel with GFR -     TSH  Controlled type 2 diabetes mellitus without complication, with long-term current use of insulin  (HCC) Assessment & Plan:  POC A1c today: 5.1 Decrease Humulin  to 3 units in the morning after increasing Mounjaro  to 12.5 mg weekly. - Continue current regimen until the new Mounjaro  dose is available. - Monitor blood glucose levels and adjust insulin  as needed. - Contact the office if blood glucose levels become unstable   Mixed diabetic hyperlipidemia associated with type 2 diabetes mellitus (HCC) Assessment & Plan: Hyperlipidemia managed with fenofibrate  and atorvastatin . Tolerating medications well. Rechecking lipid panel today.   Primary insomnia Assessment & Plan: Insomnia managed with Ambien  at night prescribed by psychiatry. No issues with morning drowsiness reported.   Mood disorder (HCC)- mixed anxiety and depression Assessment & Plan: Anxiety and depression previously managed with venlafaxine , discontinued due to side effects. Currently not on psychiatric medications. Considering changes in psychiatric care due to potential insurance issues. - Consider taking over psychiatric medication management if current psychiatrist stops accepting insurance.   Other orders -     Tirzepatide ; Inject 12.5 mg into the skin once a week.  Dispense: 6 mL; Refill: 2    Assessment and Plan Assessment & Plan Type 2 diabetes mellitus Type 2 diabetes mellitus with a history of severe hyperglycemia and DKA, currently managed with insulin  and Mounjaro . A1c was excellent five months ago. -POCT A1c today:  5.1 - Decrease Humulin  to 3 units in the morning after increasing Mounjaro  to 12.5 mg weekly. - Continue current regimen until the new Mounjaro  dose is available. - Monitor blood glucose levels and adjust insulin  as needed. - Contact the office if blood glucose levels become unstable.  Essential hypertension Essential hypertension managed with losartan  and Maxzide . Blood pressure is well-controlled.  Hyperlipidemia Hyperlipidemia managed with fenofibrate  and atorvastatin . Tolerating medications well.  Chronic pain of hip and shoulder Chronic pain in the hip and shoulder with ineffective and painful recent cortisone injections in the hip. Considering orthopedic evaluation for potential hip replacement. - Follow up with orthopedic appointment on the 11th for further evaluation.  Insomnia Insomnia managed with Ambien  at night prescribed by psychiatry. No issues with morning drowsiness reported.  Herpes simplex virus infection Herpes simplex virus infection managed with daily Valtrex .  Anxiety and depression Anxiety and depression previously managed with venlafaxine ,  discontinued due to side effects. Currently not on psychiatric medications. Considering changes in psychiatric care due to potential insurance issues. - Consider taking over psychiatric medication management if current psychiatrist stops accepting insurance.   Return in about 4 months (around 03/30/2024) for HTN, HLD, DM, Sleep.    Saddie JULIANNA Sacks, PA-C

## 2023-11-29 NOTE — Patient Instructions (Signed)
 VISIT SUMMARY: Today, we discussed your ongoing diabetes management, chronic hip and shoulder pain, and other health concerns. We made some adjustments to your diabetes medication and planned for further evaluation of your hip pain.  YOUR PLAN: TYPE 2 DIABETES MELLITUS: Your diabetes is currently managed with insulin  and Mounjaro . Your A1c was excellent five months ago. -Decrease Humulin  to 3 units in the morning after increasing Mounjaro  to 12.5 mg weekly. -Continue your current regimen until the new Mounjaro  dose is available. -We ordered an A1c test today. -Monitor your blood glucose levels and adjust insulin  as needed. -Contact the office if your blood glucose levels become unstable.  CHRONIC PAIN OF HIP AND SHOULDER: You have chronic pain in your hip and shoulder, and recent cortisone injections have not been effective. -Follow up with your orthopedic appointment on the 11th for further evaluation.  ESSENTIAL HYPERTENSION: Your blood pressure is well-controlled with losartan  and Maxzide . -Continue taking your current medications as prescribed.  HYPERLIPIDEMIA: Your cholesterol is managed with fenofibrate  and atorvastatin , and you are tolerating the medications well. -Continue taking your current medications as prescribed.  INSOMNIA: Your insomnia is managed with Ambien  at night. -Continue taking Ambien  as prescribed.  HERPES SIMPLEX VIRUS INFECTION: Your herpes simplex virus infection is managed with daily Valtrex . -Continue taking Valtrex  daily as prescribed.  ANXIETY AND DEPRESSION: You have a history of anxiety and depression, previously managed with venlafaxine , which was discontinued. You are currently not on psychiatric medications. -Consider taking over psychiatric medication management if your current psychiatrist stops accepting your insurance.  If you have any problems before your next visit feel free to message me via MyChart (minor issues or questions) or call the  office, otherwise you may reach out to schedule an office visit.  Thank you! Saddie Sacks, PA-C

## 2023-11-30 LAB — CBC WITH DIFFERENTIAL/PLATELET
Basophils Absolute: 0.1 x10E3/uL (ref 0.0–0.2)
Basos: 1 %
EOS (ABSOLUTE): 0.1 x10E3/uL (ref 0.0–0.4)
Eos: 2 %
Hematocrit: 41.9 % (ref 34.0–46.6)
Hemoglobin: 14.1 g/dL (ref 11.1–15.9)
Immature Grans (Abs): 0 x10E3/uL (ref 0.0–0.1)
Immature Granulocytes: 0 %
Lymphocytes Absolute: 1.2 x10E3/uL (ref 0.7–3.1)
Lymphs: 17 %
MCH: 31.3 pg (ref 26.6–33.0)
MCHC: 33.7 g/dL (ref 31.5–35.7)
MCV: 93 fL (ref 79–97)
Monocytes Absolute: 0.5 x10E3/uL (ref 0.1–0.9)
Monocytes: 7 %
Neutrophils Absolute: 5.2 x10E3/uL (ref 1.4–7.0)
Neutrophils: 73 %
Platelets: 297 x10E3/uL (ref 150–450)
RBC: 4.5 x10E6/uL (ref 3.77–5.28)
RDW: 13.1 % (ref 11.7–15.4)
WBC: 7 x10E3/uL (ref 3.4–10.8)

## 2023-11-30 LAB — COMPREHENSIVE METABOLIC PANEL WITH GFR
ALT: 17 IU/L (ref 0–32)
AST: 14 IU/L (ref 0–40)
Albumin: 4.6 g/dL (ref 3.9–4.9)
Alkaline Phosphatase: 84 IU/L (ref 44–121)
BUN/Creatinine Ratio: 18 (ref 12–28)
BUN: 37 mg/dL — ABNORMAL HIGH (ref 8–27)
Bilirubin Total: 0.3 mg/dL (ref 0.0–1.2)
CO2: 19 mmol/L — ABNORMAL LOW (ref 20–29)
Calcium: 10.2 mg/dL (ref 8.7–10.3)
Chloride: 101 mmol/L (ref 96–106)
Creatinine, Ser: 2.05 mg/dL — ABNORMAL HIGH (ref 0.57–1.00)
Globulin, Total: 2.6 g/dL (ref 1.5–4.5)
Glucose: 89 mg/dL (ref 70–99)
Potassium: 4 mmol/L (ref 3.5–5.2)
Sodium: 138 mmol/L (ref 134–144)
Total Protein: 7.2 g/dL (ref 6.0–8.5)
eGFR: 26 mL/min/1.73 — ABNORMAL LOW (ref >59)

## 2023-11-30 LAB — LIPID PANEL
Chol/HDL Ratio: 3.2 ratio (ref 0.0–4.4)
Cholesterol, Total: 115 mg/dL (ref 100–199)
HDL: 36 mg/dL — ABNORMAL LOW (ref >39)
LDL Chol Calc (NIH): 53 mg/dL (ref 0–99)
Triglycerides: 154 mg/dL — ABNORMAL HIGH (ref 0–149)
VLDL Cholesterol Cal: 26 mg/dL (ref 5–40)

## 2023-11-30 LAB — TSH: TSH: 2.15 u[IU]/mL (ref 0.450–4.500)

## 2023-12-02 ENCOUNTER — Telehealth: Payer: Self-pay | Admitting: *Deleted

## 2023-12-02 ENCOUNTER — Ambulatory Visit: Payer: Self-pay

## 2023-12-02 DIAGNOSIS — R7989 Other specified abnormal findings of blood chemistry: Secondary | ICD-10-CM

## 2023-12-02 NOTE — Telephone Encounter (Signed)
 This message should be attached to result message.

## 2023-12-02 NOTE — Telephone Encounter (Signed)
 Copied from CRM [id number]. Topic: Clinical - Lab/Test Results >> Dec 02, 2023 11:08 AM Delon DASEN wrote: Reason for CRM: read results verbatim- schedule for labs

## 2023-12-02 NOTE — Telephone Encounter (Signed)
 Tried calling the patient; voicemail isn't set up. Sent patient a [employer].

## 2023-12-04 ENCOUNTER — Other Ambulatory Visit: Payer: Self-pay

## 2023-12-04 DIAGNOSIS — E1169 Type 2 diabetes mellitus with other specified complication: Secondary | ICD-10-CM

## 2023-12-09 ENCOUNTER — Encounter: Payer: Self-pay | Admitting: Sports Medicine

## 2023-12-09 ENCOUNTER — Ambulatory Visit: Admitting: Sports Medicine

## 2023-12-09 DIAGNOSIS — M7602 Gluteal tendinitis, left hip: Secondary | ICD-10-CM | POA: Diagnosis not present

## 2023-12-09 DIAGNOSIS — M25552 Pain in left hip: Secondary | ICD-10-CM | POA: Diagnosis not present

## 2023-12-09 DIAGNOSIS — G8929 Other chronic pain: Secondary | ICD-10-CM | POA: Diagnosis not present

## 2023-12-09 DIAGNOSIS — M67814 Other specified disorders of tendon, left shoulder: Secondary | ICD-10-CM | POA: Diagnosis not present

## 2023-12-09 DIAGNOSIS — M1612 Unilateral primary osteoarthritis, left hip: Secondary | ICD-10-CM | POA: Diagnosis not present

## 2023-12-09 DIAGNOSIS — G5702 Lesion of sciatic nerve, left lower limb: Secondary | ICD-10-CM

## 2023-12-09 MED ORDER — CYCLOBENZAPRINE HCL 5 MG PO TABS: 5.0000 mg | ORAL_TABLET | Freq: Two times a day (BID) | ORAL | 1 refills | Status: AC

## 2023-12-09 NOTE — Progress Notes (Signed)
 Patient says that she did not get any relief from the last hip injection. She says that her pain is now getting in the way of her daily activities. She would like to discuss other options, as she does not want to have another injection if it will not give her relief. She says that her shoulder is not 100% improved, but it is overall better with the various treatments and therapy. She continues to do her hip and shoulder exercises at home. Her pain is in the same location of the hip.

## 2023-12-09 NOTE — Progress Notes (Signed)
 Mercedes Webb - 68 y.o. female MRN [id number]  Date of birth: Apr 10, 1956  Office Visit Note: Visit Date: 12/09/2023 PCP: Gayle Saddie FALCON, PA-C Referred by: Gayle Saddie FALCON, PA-C  Subjective: Chief Complaint  Patient presents with   Left Hip - Follow-up   HPI: Mercedes Webb is a pleasant 68 y.o. female who presents today for follow-up of chronic left hip pain.  She is dealing with left-sided posterior hip pain.  She is continuing doing her hip and shoulder exercises at home.  Her shoulder is largely improved after formalized PT with transition to HEP.  Back on 03/25/2023, we did perform ultrasound-guided intra-articular hip injection - reports this did not help her hip pain.  She had 1 previously the year prior on 01/05/2022 which initially gave her excellent relief of her pain at that time.  Pertinent ROS were reviewed with the patient and found to be negative unless otherwise specified above in HPI.   Assessment & Plan: Visit Diagnoses:  1. Chronic left hip pain   2. Unilateral primary osteoarthritis, left hip   3. Gluteal tendinitis, left hip   4. Piriformis syndrome of left side   5. Tendinosis of left rotator cuff    Plan: Impression is chronic left posterior hip pain which does have mild to moderate arthritic change but I believe more of her pain is emanating from her gluteal tendinopathy with insufficiency and concomitant piriformis syndrome as most of her pain is deep within the gluteal/buttock region.  I would like to get her started in formalized physical therapy to work on the posterior hip and her gluteal stabilization.  Do also think she would benefit from dry needling and other soft tissue treatment.  I also would like to trial 2-3 treatments of extracorporeal shockwave therapy to see how she responds overall.  Could consider repeat hip MRI only if not improving with above.  In terms of her left shoulder, she has made excellent strides from previous PT, she will continue  her home exercises for maintenance and preservation of her rotator cuff and shoulder joint.  She is having tightness of the muscles and pain at nighttime, given this we will start her on Flexeril  5 to 10 mg (start with 5mg  in AM, 10mg  in PM) to be taken twice daily as needed.  I will see her back over the next 1-2 weeks for repeat eval and shockwave treatment.  Follow-up: Return in about 1 week (around 12/16/2023) for make 2 appts 1-week apart in a row.   Meds & Orders:  Meds ordered this encounter  Medications   cyclobenzaprine  (FLEXERIL ) 5 MG tablet    Sig: Take 1-2 tablets (5-10 mg total) by mouth in the morning and at bedtime.    Dispense:  60 tablet    Refill:  1    Orders Placed This Encounter  Procedures   Ambulatory referral to Physical Therapy     Procedures: No procedures performed      Clinical History: No specialty comments available.  She reports that she quit smoking about 35 years ago. Her smoking use included cigarettes. She started smoking about 53 years ago. She has a 9 pack-year smoking history. She has never been exposed to tobacco smoke. She has never used smokeless tobacco.  Recent Labs    03/19/23 1526 06/25/23 1025 11/29/23 1019  HGBA1C 5.7 5.2 5.1    Objective:    Physical Exam  Gen: Well-appearing, in no acute distress; non-toxic CV: Well-perfused. Warm.  Resp:  Breathing unlabored on room air; no wheezing. Psych: Fluid speech in conversation; appropriate affect; normal thought process  Ortho Exam - Left hip/gluteal:   Imaging:  05/13/23: 2 views of the left hip including AP and lateral femoral ordered and  reviewed by myself.  X-rays demonstrate mild to moderate osteoarthritic  change of the left hip.  There is a small degree of subchondral sclerosis  over the acetabulum near the rim.  No breakdown or loss of the femoral  head neck juncture, no acute fracture.  This appears similar to previous  x-rays back in 2023 without advanced progression.    Past Medical/Family/Surgical/Social History: Medications & Allergies reviewed per EMR, new medications updated. Patient Active Problem List   Diagnosis Date Noted   Pain in left hip 01/04/2022   Trochanteric bursitis, left hip 11/07/2021   Microalbuminuria due to type 2 diabetes mellitus (HCC) 06/17/2019   Controlled type 2 diabetes mellitus without complication, with long-term current use of insulin  (HCC) 10/23/2018   Secondary hyperhidrosis- not focal in nature but primarily from waist up is the worst 08/20/2018   Low serum progesterone  05/30/2018   Excessive sweating- likely due to hormonal 11/11/2017   Elevated serum creatinine 09/04/2017   Elevated ALT measurement 09/04/2017   Postmenopausal- 30 yrs ago TAH yrs ago 11/12/2016   Hormone imbalance- txed by gyn 11/12/2016   Primary insomnia 11/12/2016   Mood disorder (HCC)- mixed anxiety and depression 11/12/2016   Vitamin D  deficiency 09/28/2016   Mixed diabetic hyperlipidemia associated with type 2 diabetes mellitus (HCC) 09/28/2016   Hypokalemia 02/01/2016   Hypertension associated with diabetes (HCC) 01/31/2016   Generalized anxiety disorder 01/31/2016   Surgical menopause 12/23/2014   Status post abdominal hysterectomy 12/23/2014   History of cervical dysplasia 12/23/2014   SUI (stress urinary incontinence, female) 12/23/2014   Obesity, Class I, BMI 30-34.9 12/23/2014   Past Medical History:  Diagnosis Date   Anxiety    Cancer (HCC) 2016   Skin CA squamous cell on bilateral feet removed   Cervical dysplasia    h/o   Colon polyps    polyps on first scope ~ 2006, no recurrent polyps ~ 2001 and in 2016.    Diabetes mellitus without complication (HCC)    Endometriosis    Family history of adverse reaction to anesthesia    Mother had difficulty waking & nausea   Headache    Hypertension    Insomnia    Pancreatitis 01/2016   Recurrent cold sores    Family History  Problem Relation Age of Onset   Diabetes Father     Heart disease Father    Breast cancer Sister 27   Diabetes Brother    Colon cancer Neg Hx    Ovarian cancer Neg Hx    Past Surgical History:  Procedure Laterality Date   ABDOMINAL HYSTERECTOMY     tah/bso- endometriosis   CHOLECYSTECTOMY     ERCP N/A 02/03/2016   Procedure: ENDOSCOPIC RETROGRADE CHOLANGIOPANCREATOGRAPHY (ERCP);  Surgeon: Belvie Just, MD;  Location: [facility] ENDOSCOPY;  Service: Endoscopy;  Laterality: N/A;   KNEE ARTHROSCOPY     tonsillectomy     TONSILLECTOMY     TUBAL LIGATION     Social History   Occupational History   Not on file  Tobacco Use   Smoking status: Former    Current packs/day: 0.00    Average packs/day: 0.5 packs/day for 18.0 years (9.0 ttl pk-yrs)    Types: Cigarettes    Start date: 04/30/1970  Quit date: 04/30/1988    Years since quitting: 35.6    Passive exposure: Never   Smokeless tobacco: Never  Vaping Use   Vaping status: Never Used  Substance and Sexual Activity   Alcohol use: Not Currently   Drug use: Yes    Types: Hashish   Sexual activity: Yes    Partners: Male    Birth control/protection: Surgical

## 2023-12-13 ENCOUNTER — Other Ambulatory Visit

## 2023-12-13 DIAGNOSIS — R7989 Other specified abnormal findings of blood chemistry: Secondary | ICD-10-CM

## 2023-12-14 LAB — BASIC METABOLIC PANEL WITH GFR
BUN/Creatinine Ratio: 17 (ref 12–28)
BUN: 18 mg/dL (ref 8–27)
CO2: 21 mmol/L (ref 20–29)
Calcium: 10.1 mg/dL (ref 8.7–10.3)
Chloride: 99 mmol/L (ref 96–106)
Creatinine, Ser: 1.04 mg/dL — ABNORMAL HIGH (ref 0.57–1.00)
Glucose: 153 mg/dL — ABNORMAL HIGH (ref 70–99)
Potassium: 3.7 mmol/L (ref 3.5–5.2)
Sodium: 138 mmol/L (ref 134–144)
eGFR: 58 mL/min/1.73 — ABNORMAL LOW (ref >59)

## 2023-12-16 ENCOUNTER — Ambulatory Visit: Admitting: Sports Medicine

## 2023-12-16 ENCOUNTER — Ambulatory Visit: Payer: Self-pay

## 2023-12-16 ENCOUNTER — Encounter: Payer: Self-pay | Admitting: Sports Medicine

## 2023-12-16 DIAGNOSIS — G5702 Lesion of sciatic nerve, left lower limb: Secondary | ICD-10-CM | POA: Diagnosis not present

## 2023-12-16 DIAGNOSIS — M7602 Gluteal tendinitis, left hip: Secondary | ICD-10-CM | POA: Diagnosis not present

## 2023-12-16 DIAGNOSIS — M25552 Pain in left hip: Secondary | ICD-10-CM

## 2023-12-16 DIAGNOSIS — G8929 Other chronic pain: Secondary | ICD-10-CM

## 2023-12-16 NOTE — Progress Notes (Signed)
 Mercedes Webb - 68 y.o. female MRN [id number]  Date of birth: [birth year]  Office Visit Note: Visit Date: 12/16/2023 PCP: Gayle Saddie FALCON, PA-C Referred by: Gayle Saddie FALCON, PA-C  Subjective: Chief Complaint  Patient presents with   Left Hip - Follow-up   HPI: Mercedes Webb is a pleasant 68 y.o. female who presents today for follow-up of chronic posterior left hip pain.  She continues with left-sided posterior and lateral hip pain.  She was referred to physical therapy but has not yet started this.  She is interested in proceeding with extracorporeal shockwave therapy for the gluteal tendons and her piriformis.  She has been recently taking Tylenol arthritis to see if this helps her pain, avoiding NSAIDs.  Pertinent ROS were reviewed with the patient and found to be negative unless otherwise specified above in HPI.   Assessment & Plan: Visit Diagnoses:  1. Chronic left hip pain   2. Gluteal tendinitis, left hip   3. Piriformis syndrome of left side    Plan: Impression is chronic left posterior lateral hip pain with gluteal tendinopathy and insufficiency with hip abduction weakness and associated piriformis syndrome with pain deep within the gluteal and buttock region.  We did perform our first treatment of extracorporeal shockwave therapy today, patient tolerated well.  We will get her started in formalized physical therapy to work on abduction and hip/pelvic stability.  I also think she would benefit from dry needling and other soft tissue treatments within the piriformis musculature.  She will follow-up over the next week for repeat shockwave and further evaluation.  Okay for Tylenol as needed, she also has Flexeril  to take at nighttime for muscle related pain as needed.  Follow-up: f/u 1 week    Meds & Orders: No orders of the defined types were placed in this encounter.  No orders of the defined types were placed in this encounter.    Procedures: Procedure:  ECSWT Indications:  Piriformis syndrome, Gluteal tendinopathy   Procedure Details Consent: Risks of procedure as well as the alternatives and risks of each were explained to the patient.  Verbal consent for procedure obtained. Time Out: Verified patient identification, verified procedure, site was marked, verified correct patient position. The area was cleaned with alcohol swab.     The left gluteal tendons and piriformis was targeted for Extracorporeal shockwave therapy.    Preset: Tendinopathy Power Level: 110 mJ Frequency: 12 Hz Impulse/cycles: 2800 Head size: Regular   Patient tolerated procedure well without immediate complications.       Clinical History: No specialty comments available.  She reports that she quit smoking about 35 years ago. Her smoking use included cigarettes. She started smoking about 53 years ago. She has a 9 pack-year smoking history. She has never been exposed to tobacco smoke. She has never used smokeless tobacco.  Recent Labs    03/19/23 1526 06/25/23 1025 11/29/23 1019  HGBA1C 5.7 5.2 5.1    Objective:    Physical Exam  Gen: Well-appearing, in no acute distress; non-toxic CV: Well-perfused. Warm.  Resp: Breathing unlabored on room air; no wheezing. Psych: Fluid speech in conversation; appropriate affect; normal thought process  Ortho Exam - Left hip: No significant tenderness or swelling over the greater trochanteric region.  While palpating along the mid aspect of the gluteal tendons as well as the piriformis there is some myofascial restriction as well as tenderness to palpation.  There is reduced hip abduction strength on the left compared to full strength  on the right.  Imaging: No results found.  Past Medical/Family/Surgical/Social History: Medications & Allergies reviewed per EMR, new medications updated. Patient Active Problem List   Diagnosis Date Noted   Pain in left hip 01/04/2022   Trochanteric bursitis, left hip 11/07/2021    Microalbuminuria due to type 2 diabetes mellitus (HCC) 06/17/2019   Controlled type 2 diabetes mellitus without complication, with long-term current use of insulin  (HCC) 10/23/2018   Secondary hyperhidrosis- not focal in nature but primarily from waist up is the worst 08/20/2018   Low serum progesterone  05/30/2018   Excessive sweating- likely due to hormonal 11/11/2017   Elevated serum creatinine 09/04/2017   Elevated ALT measurement 09/04/2017   Postmenopausal- 30 yrs ago TAH yrs ago 11/12/2016   Hormone imbalance- txed by gyn 11/12/2016   Primary insomnia 11/12/2016   Mood disorder (HCC)- mixed anxiety and depression 11/12/2016   Vitamin D  deficiency 09/28/2016   Mixed diabetic hyperlipidemia associated with type 2 diabetes mellitus (HCC) 09/28/2016   Hypokalemia 02/01/2016   Hypertension associated with diabetes (HCC) 01/31/2016   Generalized anxiety disorder 01/31/2016   Surgical menopause 12/23/2014   Status post abdominal hysterectomy 12/23/2014   History of cervical dysplasia 12/23/2014   SUI (stress urinary incontinence, female) 12/23/2014   Obesity, Class I, BMI 30-34.9 12/23/2014   Past Medical History:  Diagnosis Date   Anxiety    Cancer (HCC) 2016   Skin CA squamous cell on bilateral feet removed   Cervical dysplasia    h/o   Colon polyps    polyps on first scope ~ 2006, no recurrent polyps ~ 2001 and in 2016.    Diabetes mellitus without complication (HCC)    Endometriosis    Family history of adverse reaction to anesthesia    Mother had difficulty waking & nausea   Headache    Hypertension    Insomnia    Pancreatitis 01/2016   Recurrent cold sores    Family History  Problem Relation Age of Onset   Diabetes Father    Heart disease Father    Breast cancer Sister 71   Diabetes Brother    Colon cancer Neg Hx    Ovarian cancer Neg Hx    Past Surgical History:  Procedure Laterality Date   ABDOMINAL HYSTERECTOMY     tah/bso- endometriosis    CHOLECYSTECTOMY     ERCP N/A 02/03/2016   Procedure: ENDOSCOPIC RETROGRADE CHOLANGIOPANCREATOGRAPHY (ERCP);  Surgeon: Belvie Just, MD;  Location: [facility] ENDOSCOPY;  Service: Endoscopy;  Laterality: N/A;   KNEE ARTHROSCOPY     tonsillectomy     TONSILLECTOMY     TUBAL LIGATION     Social History   Occupational History   Not on file  Tobacco Use   Smoking status: Former    Current packs/day: 0.00    Average packs/day: 0.5 packs/day for 18.0 years (9.0 ttl pk-yrs)    Types: Cigarettes    Start date: 04/30/1970    Quit date: 04/30/1988    Years since quitting: 35.6    Passive exposure: Never   Smokeless tobacco: Never  Vaping Use   Vaping status: Never Used  Substance and Sexual Activity   Alcohol use: Not Currently   Drug use: Yes    Types: Hashish   Sexual activity: Yes    Partners: Male    Birth control/protection: Surgical

## 2023-12-16 NOTE — Progress Notes (Signed)
 Patient says that she is feeling about the same today. She does have some questions regarding her diagnosis, as well as the process of shockwave therapy. She is here today for first shockwave treatment.

## 2023-12-23 ENCOUNTER — Encounter: Payer: Self-pay | Admitting: Sports Medicine

## 2023-12-23 ENCOUNTER — Ambulatory Visit: Admitting: Sports Medicine

## 2023-12-23 DIAGNOSIS — G8929 Other chronic pain: Secondary | ICD-10-CM

## 2023-12-23 DIAGNOSIS — M25552 Pain in left hip: Secondary | ICD-10-CM | POA: Diagnosis not present

## 2023-12-23 DIAGNOSIS — G5702 Lesion of sciatic nerve, left lower limb: Secondary | ICD-10-CM

## 2023-12-23 DIAGNOSIS — M7602 Gluteal tendinitis, left hip: Secondary | ICD-10-CM | POA: Diagnosis not present

## 2023-12-23 NOTE — Progress Notes (Signed)
 Patient says she does not notice a difference after the first shockwave therapy. She denies any significant soreness or tenderness after the first treatment. She says she is taking one of the muscle relaxer in the mornings, and two in the evenings, and is not noticing much change with that, either. She has her first PT appointment scheduled for this Friday.

## 2023-12-23 NOTE — Progress Notes (Signed)
 Mercedes Webb - 68 y.o. female MRN [id number]  Date of birth: 09-Jan-1956  Office Visit Note: Visit Date: 12/23/2023 PCP: Gayle Saddie FALCON, PA-C Referred by: Gayle Saddie FALCON, PA-C  Subjective: Chief Complaint  Patient presents with   Left Hip - Follow-up   HPI: Mercedes Webb is a pleasant 68 y.o. female who presents today for follow-up of posterior lateral left hip pain.  Mercedes Webb did not notice a big difference with our first treatment of shockwave therapy, no significant soreness.  She is taking Flexeril  5 mg daily and 10 mg nightly.  It did take some time to get into formalized physical therapy, has her first evaluation this upcoming Friday.  Pertinent ROS were reviewed with the patient and found to be negative unless otherwise specified above in HPI.   Assessment & Plan: Visit Diagnoses:  1. Chronic left hip pain   2. Piriformis syndrome of left side   3. Gluteal tendinitis, left hip    Plan: Impression is chronic posterior lateral left hip pain with evidence of piriformis syndrome with pain palpating deep within the muscle belly as well as a degree of gluteal tendinopathy/insufficiency with mild asymmetric hip abduction weakness.  We did repeat our second treatment of extracorporeal shockwave therapy, I would like to see over the next 1 to 2 weeks what sort of cumulative benefit she is receiving.  She does have her first upcoming PT appointment and I do think she would make good strides with this.  She will continue her Flexeril  5 mg a.m., 10 mg nightly.   Could consider piriformis muscle injection in future   Follow-up: Return in about 10 days (around 01/02/2024).   Meds & Orders: No orders of the defined types were placed in this encounter.  No orders of the defined types were placed in this encounter.    Procedures: Procedure: ECSWT Indications:  Piriformis syndrome, Gluteal tendinopathy   Procedure Details Consent: Risks of procedure as well as the alternatives and  risks of each were explained to the patient.  Verbal consent for procedure obtained. Time Out: Verified patient identification, verified procedure, site was marked, verified correct patient position. The area was cleaned with alcohol swab.     The left gluteal tendons and piriformis was targeted for Extracorporeal shockwave therapy.    Preset: Tendinopathy Power Level: 110 mJ Frequency: 12 Hz Impulse/cycles: 3000 Head size: Regular   Patient tolerated procedure well without immediate complications.      Clinical History: No specialty comments available.  She reports that she quit smoking about 35 years ago. Her smoking use included cigarettes. She started smoking about 53 years ago. She has a 9 pack-year smoking history. She has never been exposed to tobacco smoke. She has never used smokeless tobacco.  Recent Labs    03/19/23 1526 06/25/23 1025 11/29/23 1019  HGBA1C 5.7 5.2 5.1    Objective:    Physical Exam  Gen: Well-appearing, in no acute distress; non-toxic CV: Well-perfused. Warm.  Resp: Breathing unlabored on room air; no wheezing. Psych: Fluid speech in conversation; appropriate affect; normal thought process  Ortho Exam - Left hip/buttock: There is tenderness palpating along the aspect of the piriformis and the mid belly of the gluteal tendons.  There is no insertional gluteal tendon TTP.  There is mild weakness with resisted hip abduction can forward to 4 strength on the contralateral side.  Imaging: No results found.  Past Medical/Family/Surgical/Social History: Medications & Allergies reviewed per EMR, new medications updated. Patient Active  Problem List   Diagnosis Date Noted   Pain in left hip 01/04/2022   Trochanteric bursitis, left hip 11/07/2021   Microalbuminuria due to type 2 diabetes mellitus (HCC) 06/17/2019   Controlled type 2 diabetes mellitus without complication, with long-term current use of insulin  (HCC) 10/23/2018   Secondary hyperhidrosis-  not focal in nature but primarily from waist up is the worst 08/20/2018   Low serum progesterone  05/30/2018   Excessive sweating- likely due to hormonal 11/11/2017   Elevated serum creatinine 09/04/2017   Elevated ALT measurement 09/04/2017   Postmenopausal- 30 yrs ago TAH yrs ago 11/12/2016   Hormone imbalance- txed by gyn 11/12/2016   Primary insomnia 11/12/2016   Mood disorder (HCC)- mixed anxiety and depression 11/12/2016   Vitamin D  deficiency 09/28/2016   Mixed diabetic hyperlipidemia associated with type 2 diabetes mellitus (HCC) 09/28/2016   Hypokalemia 02/01/2016   Hypertension associated with diabetes (HCC) 01/31/2016   Generalized anxiety disorder 01/31/2016   Surgical menopause 12/23/2014   Status post abdominal hysterectomy 12/23/2014   History of cervical dysplasia 12/23/2014   SUI (stress urinary incontinence, female) 12/23/2014   Obesity, Class I, BMI 30-34.9 12/23/2014   Past Medical History:  Diagnosis Date   Anxiety    Cancer (HCC) 2016   Skin CA squamous cell on bilateral feet removed   Cervical dysplasia    h/o   Colon polyps    polyps on first scope ~ 2006, no recurrent polyps ~ 2001 and in 2016.    Diabetes mellitus without complication (HCC)    Endometriosis    Family history of adverse reaction to anesthesia    Mother had difficulty waking & nausea   Headache    Hypertension    Insomnia    Pancreatitis 01/2016   Recurrent cold sores    Family History  Problem Relation Age of Onset   Diabetes Father    Heart disease Father    Breast cancer Sister 74   Diabetes Brother    Colon cancer Neg Hx    Ovarian cancer Neg Hx    Past Surgical History:  Procedure Laterality Date   ABDOMINAL HYSTERECTOMY     tah/bso- endometriosis   CHOLECYSTECTOMY     ERCP N/A 02/03/2016   Procedure: ENDOSCOPIC RETROGRADE CHOLANGIOPANCREATOGRAPHY (ERCP);  Surgeon: Belvie Just, MD;  Location: [facility] ENDOSCOPY;  Service: Endoscopy;  Laterality: N/A;   KNEE ARTHROSCOPY      tonsillectomy     TONSILLECTOMY     TUBAL LIGATION     Social History   Occupational History   Not on file  Tobacco Use   Smoking status: Former    Current packs/day: 0.00    Average packs/day: 0.5 packs/day for 18.0 years (9.0 ttl pk-yrs)    Types: Cigarettes    Start date: 04/30/1970    Quit date: 04/30/1988    Years since quitting: 35.6    Passive exposure: Never   Smokeless tobacco: Never  Vaping Use   Vaping status: Never Used  Substance and Sexual Activity   Alcohol use: Not Currently   Drug use: Yes    Types: Hashish   Sexual activity: Yes    Partners: Male    Birth control/protection: Surgical

## 2023-12-27 ENCOUNTER — Ambulatory Visit: Admitting: Rehabilitative and Restorative Service Providers"

## 2023-12-27 ENCOUNTER — Encounter: Payer: Self-pay | Admitting: Rehabilitative and Restorative Service Providers"

## 2023-12-27 ENCOUNTER — Other Ambulatory Visit: Payer: Self-pay

## 2023-12-27 DIAGNOSIS — E1159 Type 2 diabetes mellitus with other circulatory complications: Secondary | ICD-10-CM

## 2023-12-27 DIAGNOSIS — E1169 Type 2 diabetes mellitus with other specified complication: Secondary | ICD-10-CM

## 2023-12-27 DIAGNOSIS — M6281 Muscle weakness (generalized): Secondary | ICD-10-CM | POA: Diagnosis not present

## 2023-12-27 DIAGNOSIS — R262 Difficulty in walking, not elsewhere classified: Secondary | ICD-10-CM

## 2023-12-27 DIAGNOSIS — M25552 Pain in left hip: Secondary | ICD-10-CM | POA: Diagnosis not present

## 2023-12-27 DIAGNOSIS — E66811 Obesity, class 1: Secondary | ICD-10-CM

## 2023-12-27 NOTE — Therapy (Signed)
 OUTPATIENT PHYSICAL THERAPY LOWER EXTREMITY EVALUATION  Date of referral: 12/09/2023 Referring provider: Lonell Sprang, DO Referring diagnosis? Diagnosis M25.552,G89.29 (ICD-10-CM) - Chronic left hip pain M76.02 (ICD-10-CM) - Gluteal tendinitis, left hip G57.02 (ICD-10-CM) - Piriformis syndrome of left side Treatment diagnosis? (if different than referring diagnosis) R26.2   M62.81   M25.552  What was this (referring dx) caused by? Arthritis and Other: Overuse  Nature of Condition: Chronic (continuous duration > 3 months)   Laterality: Lt  Current Functional Measure Score: Patient Specific Functional Scale 4.17  Objective measurements identify impairments when they are compared to normal values, the uninvolved extremity, and prior level of function.  [x]  Yes  []  No  Objective assessment of functional ability: Moderate functional limitations   Briefly describe symptoms: Left gluteal and lateral thigh pain  How did symptoms start: Gradually over time  Average pain intensity:  Last 24 hours: 4/10  Past week: 3-6/10  How often does the pt experience symptoms? Frequently  How much have the symptoms interfered with usual daily activities? Moderately  How has condition changed since care began at this facility? NA - initial visit  In general, how is the patients overall health? Good   BACK PAIN (STarT Back Screening Tool) No: Patient Name: Mercedes Webb MRN: [id number] DOB:[birth year], 68 y.o., female Today's Date: 12/27/2023  END OF SESSION:  PT End of Session - 12/27/23 1659     Visit Number 1    Number of Visits 16    Date for PT Re-Evaluation 02/21/24    Authorization Type [employer] Medicare    Progress Note Due on Visit 10    PT Start Time [id number]    PT Stop Time 1015    PT Time Calculation (min) 44 min    Activity Tolerance Patient tolerated treatment well;No increased pain;Patient limited by pain    Behavior During Therapy [facility] for tasks assessed/performed           Past Medical History:  Diagnosis Date   Anxiety    Cancer (HCC) 2016   Skin CA squamous cell on bilateral feet removed   Cervical dysplasia    h/o   Colon polyps    polyps on first scope ~ 2006, no recurrent polyps ~ 2001 and in 2016.    Diabetes mellitus without complication (HCC)    Endometriosis    Family history of adverse reaction to anesthesia    Mother had difficulty waking & nausea   Headache    Hypertension    Insomnia    Pancreatitis 01/2016   Recurrent cold sores    Past Surgical History:  Procedure Laterality Date   ABDOMINAL HYSTERECTOMY     tah/bso- endometriosis   CHOLECYSTECTOMY     ERCP N/A 02/03/2016   Procedure: ENDOSCOPIC RETROGRADE CHOLANGIOPANCREATOGRAPHY (ERCP);  Surgeon: Belvie Just, MD;  Location: [facility] ENDOSCOPY;  Service: Endoscopy;  Laterality: N/A;   KNEE ARTHROSCOPY     tonsillectomy     TONSILLECTOMY     TUBAL LIGATION     Patient Active Problem List   Diagnosis Date Noted   Pain in left hip 01/04/2022   Trochanteric bursitis, left hip 11/07/2021   Microalbuminuria due to type 2 diabetes mellitus (HCC) 06/17/2019   Controlled type 2 diabetes mellitus without complication, with long-term current use of insulin  (HCC) 10/23/2018   Secondary hyperhidrosis- not focal in nature but primarily from waist up is the worst 08/20/2018   Low serum progesterone  05/30/2018   Excessive sweating- likely due to hormonal 11/11/2017  Elevated serum creatinine 09/04/2017   Elevated ALT measurement 09/04/2017   Postmenopausal- 30 yrs ago TAH yrs ago 11/12/2016   Hormone imbalance- txed by gyn 11/12/2016   Primary insomnia 11/12/2016   Mood disorder (HCC)- mixed anxiety and depression 11/12/2016   Vitamin D  deficiency 09/28/2016   Mixed diabetic hyperlipidemia associated with type 2 diabetes mellitus (HCC) 09/28/2016   Hypokalemia 02/01/2016   Hypertension associated with diabetes (HCC) 01/31/2016   Generalized anxiety disorder 01/31/2016   Surgical  menopause 12/23/2014   Status post abdominal hysterectomy 12/23/2014   History of cervical dysplasia 12/23/2014   SUI (stress urinary incontinence, female) 12/23/2014   Obesity, Class I, BMI 30-34.9 12/23/2014    PCP: Saddie JULIANNA Sacks, PA-C  REFERRING PROVIDER: Lonell Sprang, DO  REFERRING DIAG: Diagnosis [phone] (ICD-10-CM) - Chronic left hip pain M76.02 (ICD-10-CM) - Gluteal tendinitis, left hip G57.02 (ICD-10-CM) - Piriformis syndrome of left side  THERAPY DIAG:  Difficulty in walking, not elsewhere classified - Plan: PT plan of care cert/re-cert  Muscle weakness (generalized) - Plan: PT plan of care cert/re-cert  Pain in left hip - Plan: PT plan of care cert/re-cert  Rationale for Evaluation and Treatment: Rehabilitation  ONSET DATE: At least 3 years  SUBJECTIVE:   SUBJECTIVE STATEMENT: Mercedes Webb has had 2 previous injections (cortisone) to her left hip/groin.    PERTINENT HISTORY: Type 2 diabetes, HTN, previous knee arthroscopy, former smoker  PAIN:  Are you having pain? Yes: NPRS scale: 3-6/10 this week Pain location: Lt lateral hip/gluteals/lateral thigh Pain description: Ache/sore, can be more sharp Aggravating factors: Lie on left side, yard work Relieving factors: Tylenol arthritis  PRECAUTIONS: None  RED FLAGS: None   WEIGHT BEARING RESTRICTIONS: No  FALLS:  Has patient fallen in last 6 months? No  LIVING ENVIRONMENT: Lives with: lives alone Lives in: House/apartment Stairs: Feels it going up stairs Has following equipment at home: None  OCCUPATION: Retired  PLOF: Independent  PATIENT GOALS: Return to sleeping on the left side, normal yard and house work without feeling the left hip  NEXT MD VISIT: 01/09/2024  OBJECTIVE:  Note: Objective measures were completed at Evaluation unless otherwise noted.  DIAGNOSTIC FINDINGS: 2 views of the left hip including AP and lateral femoral ordered and  reviewed by myself.  X-rays demonstrate mild to  moderate osteoarthritic  change of the left hip.  There is a small degree of subchondral sclerosis  over the acetabulum near the rim.  No breakdown or loss of the femoral  head neck juncture, no acute fracture.  This appears similar to previous  x-rays back in 2023 without advanced progression.  PATIENT SURVEYS:  PSFS: THE PATIENT SPECIFIC FUNCTIONAL SCALE  Place score of 0-10 (0 = unable to perform activity and 10 = able to perform activity at the same level as before injury or problem)  Activity Date: 12/27/2023    Oralia work 5    2.  Lie on the left side 0    3.  Ascend stairs 7.5    4.      Total Score 4.17      Total Score = Sum of activity scores/number of activities  Minimally Detectable Change: 3 points (for single activity); 2 points (for average score)  [facility] (nd). The Patient Specific Functional Scale . Retrieved from [email or web address]   COGNITION: Overall cognitive status: Within functional limits for tasks assessed     SENSATION: WFL  EDEMA:  Mentioned but not assessed  MUSCLE LENGTH: Hamstrings: Right 35  deg; Left 25 deg  LOWER EXTREMITY ROM:  Passive ROM Left/Right 12/27/2023   Hip flexion 90/110   Hip extension    Hip abduction    Hip adduction    Hip internal rotation 10/10   Hip external rotation 20/33   Knee flexion    Knee extension    Ankle dorsiflexion    Ankle plantarflexion    Ankle inversion    Ankle eversion     (Blank rows = not tested)  LOWER EXTREMITY STRENGTH  MMT Left/Right 12/27/2023 out of 5 MMT   Hip flexion 4+/5   Hip extension    Hip abduction 4-/4   Hip adduction    Hip internal rotation    Hip external rotation    Knee flexion    Knee extension 4+/5   Ankle dorsiflexion    Ankle plantarflexion    Ankle inversion    Ankle eversion     (Blank rows = not tested)   GAIT: Distance walked: 100 feet Assistive device utilized: None Level of  assistance: Complete Independence Comments: Mercedes Webb notes increased left hip, gluteal and lateral thigh pain with prolonged postures and prolonged weightbearing                                                                                                                                TREATMENT DATE:  12/27/2023 Single knee-to-chest stretch with opposite leg straight 4 x 20 seconds Supine hamstrings stretch with opposite leg straight 4 x 20 seconds Figure 4 stretch supine 4 x 20 seconds Alternating hip hike at countertop 10 x 3 seconds  02464: Reviewed imaging and examination findings; reviewed today 1 home exercise program; discussed the importance of frequent home exercise program compliance, changing position frequently and avoiding overuse   PATIENT EDUCATION:  Education details: See above Person educated: Patient Education method: Explanation, Demonstration, Tactile cues, Verbal cues, and Handouts Education comprehension: verbalized understanding, returned demonstration, verbal cues required, tactile cues required, and needs further education  HOME EXERCISE PROGRAM: Access Code: URL: [email or web address] Date: 12/27/2023 Prepared by: Lamar Ivory  Exercises - Single Knee to Chest Stretch  - 2 x daily - 7 x weekly - 1 sets - 5 reps - 20 seconds hold - Supine Hamstring Stretch  - 2 x daily - 7 x weekly - 1 sets - 5 reps - 20 seconds hold - Supine Figure 4 Piriformis Stretch  - 2 x daily - 7 x weekly - 1 sets - 5 reps - 20 seconds hold - Standing Hip Hiking  - 5 x daily - 7 x weekly - 1 sets - 10 reps - 5 seconds hold  ASSESSMENT:  CLINICAL IMPRESSION: Patient is a 68 y.o. female who was seen today for physical therapy evaluation and treatment for Diagnosis M25.552,G89.29 (ICD-10-CM) - Chronic left hip pain M76.02 (ICD-10-CM) - Gluteal tendinitis, left hip G57.02 (ICD-10-CM) - Piriformis syndrome of left side.  Mercedes Webb has a 3+ year history  of left gluteal,  hip and lateral thigh pain.  Symptoms have progressed to the point where she is unable to lie on her left side and it limits her ability to do yard work.  She would like to be able to return to her more normal active functional pursuits without being limited by left hip pain.  Most importantly, [name] has significant left hip abductors strength weakness along with some limitations in left hip external rotation, hip flexors and left hamstrings flexibility.  OBJECTIVE IMPAIRMENTS: Abnormal gait, decreased activity tolerance, decreased endurance, decreased knowledge of condition, difficulty walking, decreased ROM, decreased strength, increased edema, impaired perceived functional ability, impaired flexibility, and pain.   ACTIVITY LIMITATIONS: sitting, standing, squatting, sleeping, stairs, and locomotion level  PARTICIPATION LIMITATIONS: community activity and yard work  PERSONAL FACTORS: Type 2 diabetes, HTN, previous knee arthroscopy, former smoker are also affecting patient's functional outcome.   REHAB POTENTIAL: Good  CLINICAL DECISION MAKING: Evolving/moderate complexity  EVALUATION COMPLEXITY: Moderate   GOALS: Goals reviewed with patient? Yes  SHORT TERM GOALS: Target date: 01/24/2024 Fronie will be independent with her day 1 home exercise program Baseline: Started 12/27/2023 Goal status: INITIAL  2.  Mercedes Webb will have improved left hip external rotation active range of motion; left hip flexors flexibility and left hamstrings flexibility Baseline: 20; 90 and 25 degrees respectively Goal status: INITIAL  3.  Mercedes Webb will have improved left hip strength as assessed by MMT assessment Baseline: See objective Goal status: INITIAL   LONG TERM GOALS: Target date: 02/21/2024  Improve patient specific functional score to 6.5 Baseline: 4.17 Goal status: INITIAL  2.  Mercedes Webb will report left hip pain consistently 0-3/10 on the numeric pain rating scale Baseline: 3-6/10 Goal status:  INITIAL  3.  Improve left hip flexors flexibility by at least 10 degrees; hamstrings by at least 15 degrees and hip external rotation active range of motion by at least 10 degrees Baseline: 90; 25 and 20 degrees respectively Goal status: INITIAL  4.  Mercedes Webb will have improved left hip strength as assessed by MMT and her ability to return to her normal regarding activities without being stopped by left hip pain Baseline: Unable to garden or sleep on the left side Goal status: INITIAL  5.  Mercedes Webb will be independent with a long-term maintenance home exercise program at discharge Baseline: Started 12/27/2023 Goal status: INITIAL   PLAN:  PT FREQUENCY: 1-2x/week  PT DURATION: 8 weeks  PLANNED INTERVENTIONS: 97750- Physical Performance Testing, 97110-Therapeutic exercises, 97530- Therapeutic activity, 97112- Neuromuscular re-education, 97535- Self Care, 02859- Manual therapy, [phone]- Gait training, [phone] (1-2 muscles), 20561 (3+ muscles)- Dry Needling, Patient/Family education, Balance training, Stair training, Cryotherapy, and Moist heat  PLAN FOR NEXT SESSION: Address active range of motion and flexibility impairments with particular emphasis on proximal hip strengthening, hip abductors emphasis while avoiding overuse   Myer LELON Ivory, PT, MPT 12/27/2023, 5:20 PM

## 2023-12-31 ENCOUNTER — Other Ambulatory Visit: Payer: Self-pay | Admitting: Family Medicine

## 2023-12-31 DIAGNOSIS — E1169 Type 2 diabetes mellitus with other specified complication: Secondary | ICD-10-CM

## 2024-01-01 ENCOUNTER — Ambulatory Visit
Admission: RE | Admit: 2024-01-01 | Discharge: 2024-01-01 | Disposition: A | Discharge status: Home / Self Care | Source: Ambulatory Visit

## 2024-01-01 DIAGNOSIS — Z1231 Encounter for screening mammogram for malignant neoplasm of breast: Secondary | ICD-10-CM

## 2024-01-09 ENCOUNTER — Ambulatory Visit: Admitting: Sports Medicine

## 2024-01-09 ENCOUNTER — Encounter: Payer: Self-pay | Admitting: Sports Medicine

## 2024-01-09 DIAGNOSIS — Z794 Long term (current) use of insulin: Secondary | ICD-10-CM | POA: Diagnosis not present

## 2024-01-09 DIAGNOSIS — M25552 Pain in left hip: Secondary | ICD-10-CM

## 2024-01-09 DIAGNOSIS — G8929 Other chronic pain: Secondary | ICD-10-CM

## 2024-01-09 DIAGNOSIS — E1169 Type 2 diabetes mellitus with other specified complication: Secondary | ICD-10-CM | POA: Diagnosis not present

## 2024-01-09 DIAGNOSIS — G5702 Lesion of sciatic nerve, left lower limb: Secondary | ICD-10-CM | POA: Diagnosis not present

## 2024-01-09 DIAGNOSIS — M7918 Myalgia, other site: Secondary | ICD-10-CM

## 2024-01-09 MED ORDER — CYCLOBENZAPRINE HCL 10 MG PO TABS: 10.0000 mg | ORAL_TABLET | Freq: Three times a day (TID) | ORAL | 1 refills | Status: DC | PRN

## 2024-01-09 NOTE — Progress Notes (Signed)
 Patient says that she was sore the day after the last shockwave treatment. She says that she went to her first physical therapy appointment and was given home exercises to do, which she is doing bilaterally. She did not get any relief from the last shockwave treatment, and says she is open to any new treatments that may help, or additional shockwave if that would be helpful.

## 2024-01-09 NOTE — Progress Notes (Signed)
 Mercedes Webb - 68 y.o. female MRN [id number]  Date of birth: September 28, 1955  Office Visit Note: Visit Date: 01/09/2024 PCP: Gayle Saddie FALCON, PA-C Referred by: Gayle Saddie FALCON, PA-C  Subjective: Chief Complaint  Patient presents with   Left Hip - Follow-up   HPI: Mercedes Webb is a pleasant 68 y.o. female who presents today for chronic left posterior hip pain.  Mercedes Webb has had 2 treatments of extracorporeal shockwave therapy for the piriformis and posterior hip/buttock.  She had some mild soreness the second treatment and maybe mild relief but no significant relief.  She did have her first physical therapy appointment and had some exercises shown but did not think this was a very helpful PT session.   She is a type II diabetic, but has made drastic lifestyle changes and has had excellent control recently.  She is managed on Humulin  70/30 three units in AM. She is also on IM Mounjaro  once weekly. 1 year ago on 08/24/2022 her A1c was 10.1.  Lab Results  Component Value Date   HGBA1C 5.1 11/29/2023   Pertinent ROS were reviewed with the patient and found to be negative unless otherwise specified above in HPI.   Assessment & Plan: Visit Diagnoses:  1. Chronic left hip pain   2. Piriformis syndrome of left side   3. Myofascial pain on left side   4. Type 2 diabetes mellitus with other specified complication, with long-term current use of insulin  (HCC)    Plan: Impression is acute on chronic left posterior hip/buttock pain that does seem to emanate from the piriformis musculature.  Did not notice significant relief from extracorporeal shockwave therapy.  Through shared decision making, we did proceed with piriformis sheath injection both for diagnostic and therapeutic purposes.  Advised on postinjection protocol.  Following 48 hours, she may resume her home exercise therapy, do also think she would find benefit from her masseuse who is a PTA with active and dynamic stretching.  Given some of  the myofascial restriction/tightness, she will use Flexeril  10 mg once to twice daily/nightly as needed.  Did discuss transient glucose rise given diabetes that she may check POC bG as needed, but she has been under excellent control, she will continue her Humulin  70-33 units a.m., Mounjaro  once weekly.  She will update me in 2 or 3 weeks how she is doing.  If for some reason she is not finding significant improvement, next step would likely be MRI of the left hip.  Follow-up: Return for my mychart message/call in 2-3 weeks for update as needed.   Meds & Orders:  Meds ordered this encounter  Medications   cyclobenzaprine  (FLEXERIL ) 10 MG tablet    Sig: Take 1 tablet (10 mg total) by mouth 3 (three) times daily as needed for muscle spasms.    Dispense:  30 tablet    Refill:  1   No orders of the defined types were placed in this encounter.    Procedures: Procedure: Piriformis injection, Left After discussion on risks/benefits/indications and informed verbal consent was obtained, a timeout was performed. Patient was lying prone on exam table.The posterior hip and buttock was cleaned with Chloraprep and multiple alcohol swabs. Then utilizing landmark palpation, the patient's piriformis muscle and sheath was identified with associated region of tenderness.Then, the piriformis muscle sheath was injected with a combination of 2:1:1 lidocaine :bupivicaine:betamethasone with visualization of spread through the muscular sheath. Patient tolerated well without immediate complications.  Band-Aid was applied.  Clinical History: No specialty comments available.  She reports that she quit smoking about 35 years ago. Her smoking use included cigarettes. She started smoking about 53 years ago. She has a 9 pack-year smoking history. She has never been exposed to tobacco smoke. She has never used smokeless tobacco.  Recent Labs    03/19/23 1526 06/25/23 1025 11/29/23 1019  HGBA1C 5.7 5.2 5.1     Objective:    Physical Exam  Gen: Well-appearing, in no acute distress; non-toxic CV: Well-perfused. Warm.  Resp: Breathing unlabored on room air; no wheezing. Psych: Fluid speech in conversation; appropriate affect; normal thought process  Ortho Exam - Left hip/buttock: No redness swelling or effusion over the anterior or posterior hip.  There is fluid logroll with internal and external rotation without pain.  Negative FADIR test, negative FABER and Stinchfield testing.  There is pain with deep palpation right in the mid belly of the piriformis musculature.  I can feel a mild myofascial restriction in this location with prone internal and external rotation about the hip.  Imaging: No results found.  Past Medical/Family/Surgical/Social History: Medications & Allergies reviewed per EMR, new medications updated. Patient Active Problem List   Diagnosis Date Noted   Pain in left hip 01/04/2022   Trochanteric bursitis, left hip 11/07/2021   Microalbuminuria due to type 2 diabetes mellitus (HCC) 06/17/2019   Controlled type 2 diabetes mellitus without complication, with long-term current use of insulin  (HCC) 10/23/2018   Secondary hyperhidrosis- not focal in nature but primarily from waist up is the worst 08/20/2018   Low serum progesterone  05/30/2018   Excessive sweating- likely due to hormonal 11/11/2017   Elevated serum creatinine 09/04/2017   Elevated ALT measurement 09/04/2017   Postmenopausal- 30 yrs ago TAH yrs ago 11/12/2016   Hormone imbalance- txed by gyn 11/12/2016   Primary insomnia 11/12/2016   Mood disorder (HCC)- mixed anxiety and depression 11/12/2016   Vitamin D  deficiency 09/28/2016   Mixed diabetic hyperlipidemia associated with type 2 diabetes mellitus (HCC) 09/28/2016   Hypokalemia 02/01/2016   Hypertension associated with diabetes (HCC) 01/31/2016   Generalized anxiety disorder 01/31/2016   Surgical menopause 12/23/2014   Status post abdominal hysterectomy  12/23/2014   History of cervical dysplasia 12/23/2014   SUI (stress urinary incontinence, female) 12/23/2014   Obesity, Class I, BMI 30-34.9 12/23/2014   Past Medical History:  Diagnosis Date   Anxiety    Cancer (HCC) 2016   Skin CA squamous cell on bilateral feet removed   Cervical dysplasia    h/o   Colon polyps    polyps on first scope ~ 2006, no recurrent polyps ~ 2001 and in 2016.    Diabetes mellitus without complication (HCC)    Endometriosis    Family history of adverse reaction to anesthesia    Mother had difficulty waking & nausea   Headache    Hypertension    Insomnia    Pancreatitis 01/2016   Recurrent cold sores    Family History  Problem Relation Age of Onset   Diabetes Father    Heart disease Father    Breast cancer Sister 27   Diabetes Brother    Colon cancer Neg Hx    Ovarian cancer Neg Hx    Past Surgical History:  Procedure Laterality Date   ABDOMINAL HYSTERECTOMY     tah/bso- endometriosis   CHOLECYSTECTOMY     ERCP N/A 02/03/2016   Procedure: ENDOSCOPIC RETROGRADE CHOLANGIOPANCREATOGRAPHY (ERCP);  Surgeon: Belvie Just, MD;  Location: [facility] ENDOSCOPY;  Service: Endoscopy;  Laterality: N/A;   KNEE ARTHROSCOPY     tonsillectomy     TONSILLECTOMY     TUBAL LIGATION     Social History   Occupational History   Not on file  Tobacco Use   Smoking status: Former    Current packs/day: 0.00    Average packs/day: 0.5 packs/day for 18.0 years (9.0 ttl pk-yrs)    Types: Cigarettes    Start date: 04/30/1970    Quit date: 04/30/1988    Years since quitting: 35.7    Passive exposure: Never   Smokeless tobacco: Never  Vaping Use   Vaping status: Never Used  Substance and Sexual Activity   Alcohol use: Not Currently   Drug use: Yes    Types: Hashish   Sexual activity: Yes    Partners: Male    Birth control/protection: Surgical

## 2024-01-10 ENCOUNTER — Other Ambulatory Visit: Payer: Self-pay

## 2024-01-10 DIAGNOSIS — E1169 Type 2 diabetes mellitus with other specified complication: Secondary | ICD-10-CM

## 2024-01-20 ENCOUNTER — Encounter: Admitting: Rehabilitative and Restorative Service Providers"

## 2024-01-23 ENCOUNTER — Encounter: Admitting: Rehabilitative and Restorative Service Providers"

## 2024-01-27 ENCOUNTER — Encounter: Payer: Self-pay | Admitting: Sports Medicine

## 2024-01-28 ENCOUNTER — Other Ambulatory Visit: Payer: Self-pay | Admitting: Sports Medicine

## 2024-01-28 DIAGNOSIS — G5702 Lesion of sciatic nerve, left lower limb: Secondary | ICD-10-CM

## 2024-01-28 DIAGNOSIS — G8929 Other chronic pain: Secondary | ICD-10-CM

## 2024-01-29 ENCOUNTER — Encounter: Admitting: Rehabilitative and Restorative Service Providers"

## 2024-01-31 ENCOUNTER — Encounter: Admitting: Rehabilitative and Restorative Service Providers"

## 2024-02-03 ENCOUNTER — Encounter: Admitting: Rehabilitative and Restorative Service Providers"

## 2024-02-06 ENCOUNTER — Encounter: Admitting: Rehabilitative and Restorative Service Providers"

## 2024-02-09 ENCOUNTER — Ambulatory Visit
Admission: RE | Admit: 2024-02-09 | Discharge: 2024-02-09 | Disposition: A | Source: Ambulatory Visit | Attending: Sports Medicine

## 2024-02-09 DIAGNOSIS — G5702 Lesion of sciatic nerve, left lower limb: Secondary | ICD-10-CM

## 2024-02-09 DIAGNOSIS — G8929 Other chronic pain: Secondary | ICD-10-CM

## 2024-02-18 ENCOUNTER — Ambulatory Visit (INDEPENDENT_AMBULATORY_CARE_PROVIDER_SITE_OTHER)

## 2024-02-18 ENCOUNTER — Encounter: Payer: Self-pay | Admitting: Sports Medicine

## 2024-02-18 ENCOUNTER — Ambulatory Visit: Admitting: Sports Medicine

## 2024-02-18 VITALS — Temp 97.8°F

## 2024-02-18 DIAGNOSIS — M7918 Myalgia, other site: Secondary | ICD-10-CM

## 2024-02-18 DIAGNOSIS — G5702 Lesion of sciatic nerve, left lower limb: Secondary | ICD-10-CM

## 2024-02-18 DIAGNOSIS — Z23 Encounter for immunization: Secondary | ICD-10-CM | POA: Diagnosis not present

## 2024-02-18 NOTE — Progress Notes (Signed)
 Pt in office today for flu shot.  Vaccine given and tolerated.

## 2024-02-18 NOTE — Progress Notes (Signed)
 Mercedes Webb - 68 y.o. female MRN [id number]  Date of birth: 1955/11/08  Office Visit Note: Visit Date: 02/18/2024 PCP: Gayle Saddie FALCON, PA-C Referred by: Gayle Saddie FALCON DEVONNA  Medical Resident, Sports Medicine Fellow - Attending Physician Addendum:   I have independently interviewed and examined the patient myself. I have discussed the above with the original author and agree with their documentation. My edits for correction/addition/clarification have been made, see any changes above and below.   In summary, pleasant 68 year old female with persistent left posterior hip/buttock pain, near the region of the piriformis muscle.  Did review MRI today which showed no pathology.  Did have a landmark guided piriformis injection which gave her temporary relief but not long-lasting relief.  She has done well from soft tissue treatment and dry needling for other conditions in the past, recommend she revisit physical therapy, referral sent to [facility] Ku for dry needling and soft tissue treatment for her left piriformis muscle.  When she gets established with this, I will see her back after number of treatments for reevaluation.  Could consider ultrasound-guided piriformis inj/vs. Sciatic nerve hydrodissection. Ok for OTC medications as needed in interim, continue HEP.  Lonell Sprang, DO Primary Care Sports Medicine Physician  [facility] - Orthopedics  Subjective: Chief Complaint  Patient presents with   Left Hip - Follow-up   HPI: Mercedes Webb is a pleasant 68 y.o. female who presents today for follow-up regarding left piriformis syndrome. Last seen on 01/09/24.  Has been completing physical therapy exercises and getting massages regularly in additional to prior injection + ECSWT.  Patient states that despite all interventions we have tried so far she is yet to find persistent adequate relief of her deep gluteal pain.  Patient recently underwent MRI of left hip which did not  show any specific tendinopathy or acute hip pathology to explain cause of pain.  In the past we have attempted treatment of this deep gluteal pain via 2 sessions of shockwave, piriformis sheath cortisone injection,and left intraarticular hip injection. Nothing has given her complete resolution of her symptoms.  Injections may have helped for 4-5 days.  Patient states that she continues to be tender to palpation and describes pain as deep burning and occasionally sharp pain.  No specific aggravating activities.  Gets worse throughout the day.  Pertinent ROS were reviewed with the patient and found to be negative unless otherwise specified above in HPI.   Assessment & Plan: Visit Diagnoses:  1. Piriformis syndrome of left side   2. Pain in left buttock    Plan: Recent MRI unremarkable for specific pathology that correlates with her deep left gluteal pain.  Symptoms continue to be most consistent with left piriformis syndrome.  Will refer patient to [facility] PT for ~3 sessions of dry needling into piriformis and surrounding gluteal tissue.  Continue rehab exercises at this time.  We also discussed possibility of performing piriformis/sciatic hydrodissection if pain still unresponsive to dry needling.  She will schedule follow-up with us  after she has completed her sessions with [facility].  Patient in agreement with treatment plan.  No further questions at this time.  Meds & Orders: No orders of the defined types were placed in this encounter.   Orders Placed This Encounter  Procedures   Ambulatory referral to Physical Therapy     Procedures: No procedures performed      Clinical History: No specialty comments available.  She reports that she quit smoking about 35 years  ago. Her smoking use included cigarettes. She started smoking about 53 years ago. She has a 9 pack-year smoking history. She has never been exposed to tobacco smoke. She has never used smokeless tobacco.  Recent Labs     03/19/23 1526 06/25/23 1025 11/29/23 1019  HGBA1C 5.7 5.2 5.1    Objective:   Vital Signs: There were no vitals taken for this visit.  Physical Exam  Gen: Well-appearing, in no acute distress; non-toxic CV: Well-perfused. Warm.  Resp: Breathing unlabored on room air; no wheezing. Psych: Fluid speech in conversation; appropriate affect; normal thought process  Ortho Exam - Patient denies erythema, ecchymosis or edema and surrounding area of concern of her left gluteus.  Patient has tenderness to palpation in her deep left gluteal tissues, specifically over her piriformis muscle.  Patient has full active/passive range of motion of her left hip that does not cause aggravation of her pain.  Patient's strength is within normal limits and symmetric to her right side.  Patient has no neurovascular deficits and denies any distal radiculopathy with palpation.  Negative logroll test.  Negative Stinchfield.  Negative FABER/FADIR test.  Imaging: No results found.  Past Medical/Family/Surgical/Social History: Medications & Allergies reviewed per EMR, new medications updated. Patient Active Problem List   Diagnosis Date Noted   Pain in left hip 01/04/2022   Trochanteric bursitis, left hip 11/07/2021   Microalbuminuria due to type 2 diabetes mellitus (HCC) 06/17/2019   Controlled type 2 diabetes mellitus without complication, with long-term current use of insulin  (HCC) 10/23/2018   Secondary hyperhidrosis- not focal in nature but primarily from waist up is the worst 08/20/2018   Low serum progesterone  05/30/2018   Excessive sweating- likely due to hormonal 11/11/2017   Elevated serum creatinine 09/04/2017   Elevated ALT measurement 09/04/2017   Postmenopausal- 30 yrs ago TAH yrs ago 11/12/2016   Hormone imbalance- txed by gyn 11/12/2016   Primary insomnia 11/12/2016   Mood disorder (HCC)- mixed anxiety and depression 11/12/2016   Vitamin D  deficiency 09/28/2016   Mixed diabetic  hyperlipidemia associated with type 2 diabetes mellitus (HCC) 09/28/2016   Hypokalemia 02/01/2016   Hypertension associated with diabetes (HCC) 01/31/2016   Generalized anxiety disorder 01/31/2016   Surgical menopause 12/23/2014   Status post abdominal hysterectomy 12/23/2014   History of cervical dysplasia 12/23/2014   SUI (stress urinary incontinence, female) 12/23/2014   Obesity, Class I, BMI 30-34.9 12/23/2014   Past Medical History:  Diagnosis Date   Anxiety    Cancer (HCC) 2016   Skin CA squamous cell on bilateral feet removed   Cervical dysplasia    h/o   Colon polyps    polyps on first scope ~ 2006, no recurrent polyps ~ 2001 and in 2016.    Diabetes mellitus without complication (HCC)    Endometriosis    Family history of adverse reaction to anesthesia    Mother had difficulty waking & nausea   Headache    Hypertension    Insomnia    Pancreatitis 01/2016   Recurrent cold sores    Family History  Problem Relation Age of Onset   Diabetes Father    Heart disease Father    Breast cancer Sister 31   Diabetes Brother    Colon cancer Neg Hx    Ovarian cancer Neg Hx    Past Surgical History:  Procedure Laterality Date   ABDOMINAL HYSTERECTOMY     tah/bso- endometriosis   CHOLECYSTECTOMY     ERCP N/A 02/03/2016   Procedure:  ENDOSCOPIC RETROGRADE CHOLANGIOPANCREATOGRAPHY (ERCP);  Surgeon: Belvie Just, MD;  Location: [facility] ENDOSCOPY;  Service: Endoscopy;  Laterality: N/A;   KNEE ARTHROSCOPY     tonsillectomy     TONSILLECTOMY     TUBAL LIGATION     Social History   Occupational History   Not on file  Tobacco Use   Smoking status: Former    Current packs/day: 0.00    Average packs/day: 0.5 packs/day for 18.0 years (9.0 ttl pk-yrs)    Types: Cigarettes    Start date: 04/30/1970    Quit date: 04/30/1988    Years since quitting: 35.8    Passive exposure: Never   Smokeless tobacco: Never  Vaping Use   Vaping status: Never Used  Substance and Sexual Activity    Alcohol use: Not Currently   Drug use: Yes    Types: Hashish   Sexual activity: Yes    Partners: Male    Birth control/protection: Surgical

## 2024-02-28 ENCOUNTER — Encounter: Payer: Self-pay | Admitting: Physical Therapy

## 2024-02-28 ENCOUNTER — Ambulatory Visit: Admitting: Physical Therapy

## 2024-02-28 DIAGNOSIS — M6281 Muscle weakness (generalized): Secondary | ICD-10-CM | POA: Diagnosis not present

## 2024-02-28 DIAGNOSIS — R262 Difficulty in walking, not elsewhere classified: Secondary | ICD-10-CM | POA: Diagnosis not present

## 2024-02-28 DIAGNOSIS — M25552 Pain in left hip: Secondary | ICD-10-CM

## 2024-02-28 NOTE — Therapy (Signed)
 OUTPATIENT PHYSICAL THERAPY EVALUATION   Patient Name: Mercedes Webb MRN: [id number] DOB:[birth year], 68 y.o., female Today's Date: 02/28/2024  END OF SESSION:  PT End of Session - 02/28/24 0933     Visit Number 1    Number of Visits 16    Date for Recertification  04/10/24    Authorization Type [employer] Medicare    Progress Note Due on Visit 10    PT Start Time [phone]    PT Stop Time 1001    PT Time Calculation (min) 30 min    Activity Tolerance Patient tolerated treatment well;No increased pain    Behavior During Therapy [facility] for tasks assessed/performed          Past Medical History:  Diagnosis Date   Anxiety    Cancer (HCC) 2016   Skin CA squamous cell on bilateral feet removed   Cervical dysplasia    h/o   Colon polyps    polyps on first scope ~ 2006, no recurrent polyps ~ 2001 and in 2016.    Diabetes mellitus without complication (HCC)    Endometriosis    Family history of adverse reaction to anesthesia    Mother had difficulty waking & nausea   Headache    Hypertension    Insomnia    Pancreatitis 01/2016   Recurrent cold sores    Past Surgical History:  Procedure Laterality Date   ABDOMINAL HYSTERECTOMY     tah/bso- endometriosis   CHOLECYSTECTOMY     ERCP N/A 02/03/2016   Procedure: ENDOSCOPIC RETROGRADE CHOLANGIOPANCREATOGRAPHY (ERCP);  Surgeon: Belvie Just, MD;  Location[facility] ENDOSCOPY;  Service: Endoscopy;  Laterality: N/A;   KNEE ARTHROSCOPY     tonsillectomy     TONSILLECTOMY     TUBAL LIGATION     Patient Active Problem List   Diagnosis Date Noted   Pain in left hip 01/04/2022   Trochanteric bursitis, left hip 11/07/2021   Microalbuminuria due to type 2 diabetes mellitus (HCC) 06/17/2019   Controlled type 2 diabetes mellitus without complication, with long-term current use of insulin  (HCC) 10/23/2018   Secondary hyperhidrosis- not focal in nature but primarily from waist up is the worst 08/20/2018   Low serum progesterone  05/30/2018    Excessive sweating- likely due to hormonal 11/11/2017   Elevated serum creatinine 09/04/2017   Elevated ALT measurement 09/04/2017   Postmenopausal- 30 yrs ago TAH yrs ago 11/12/2016   Hormone imbalance- txed by gyn 11/12/2016   Primary insomnia 11/12/2016   Mood disorder (HCC)- mixed anxiety and depression 11/12/2016   Vitamin D  deficiency 09/28/2016   Mixed diabetic hyperlipidemia associated with type 2 diabetes mellitus (HCC) 09/28/2016   Hypokalemia 02/01/2016   Hypertension associated with diabetes (HCC) 01/31/2016   Generalized anxiety disorder 01/31/2016   Surgical menopause 12/23/2014   Status post abdominal hysterectomy 12/23/2014   History of cervical dysplasia 12/23/2014   SUI (stress urinary incontinence, female) 12/23/2014   Obesity, Class I, BMI 30-34.9 12/23/2014    PCP: Gayle Saddie FALCON, PA-C  REFERRING PROVIDER: Burnetta Brunet, DO  REFERRING DIAG: G57.02 (ICD-10-CM) - Piriformis syndrome of left side  Rationale for Evaluation and Treatment: Rehabilitation  THERAPY DIAG:  Pain in left hip - Plan: PT plan of care cert/re-cert  Muscle weakness (generalized) - Plan: PT plan of care cert/re-cert  Difficulty in walking, not elsewhere classified - Plan: PT plan of care cert/re-cert  ONSET DATE: chronic x 3 years   SUBJECTIVE:  SUBJECTIVE STATEMENT: Pt reports pain in Lt hip since 2022.  She's had multiple injections and MRI which showed some mild OA.  Pain continues to be present and has been getting progressively worse.  Tylenol does not help but ibuprofen does seem to help pain.    PERTINENT HISTORY:  Anxiety, hx skin cancer, DM, HTN, hx knee scope  PAIN:  Are you having pain? Yes: NPRS scale: 6.5 currently, up to 10, at best 5/10  Pain location: Lt buttock Pain description:  burning, deep  Aggravating factors: lying on Lt side, walking Relieving factors: ibuprofen, ice packs, massage  PRECAUTIONS:  None  RED FLAGS: None   WEIGHT BEARING RESTRICTIONS:  No  FALLS:  Has patient fallen in last 6 months? No  LIVING ENVIRONMENT: Lives with: lives alone Lives in: House/apartment Stairs: Yes: External: 7 steps; can reach both   OCCUPATION:  retired  PLOF:  Independent and Leisure: go to [employer]  PATIENT GOALS:  Improve pain   OBJECTIVE:  Note: Objective measures were completed at Evaluation unless otherwise noted.  DIAGNOSTIC FINDINGS:  MRI of left hip which did not show any specific tendinopathy or acute hip pathology to explain cause of pain  PATIENT SURVEYS:  Patient-Specific Activity Scoring Scheme  0 represents "unable to perform." 10 represents "able to perform at prior level. 0 1 2 3 4 5 6 7 8 9  10 (Date and Score)   Activity Eval     1. Yard work 5    2. Lying on left side 0    3. Ascend stairs 8   Score 4.33    Total score = sum of the activity scores/number of activities Minimum detectable change (90%CI) for average score = 2 points Minimum detectable change (90%CI) for single activity score = 3 points    COGNITIVE STATUS: Within functional limits for tasks assessed   SENSATION: WFL  POSTURE:  rounded shoulders and forward head   GAIT: 02/28/24 Comments: mild trendelenburg bil   PALPATION: 02/28/24 trigger point and pain along Lt piriformis   LOWER EXTREMITY MMT:     MMT Right eval Left eval  Hip flexion 4/5 4/5  Hip extension  3/5  Hip abduction  3/5  Hip internal rotation 4/5 4/5  Hip external rotation 4/5 3+/5   (Blank rows = not tested)      SPECIAL TESTS:  02/28/24 Lower Extremity Hip special tests: [facility]) test: negative and Piriformis test: positive    TREATMENT:                                                                                                                               DATE:  02/28/24 TherEx See HEP from 12/27/23 visit - pt verbalized understanding of exercises  Self Care Educated on clinical findings and PT POC  Manual STM with compression to Lt glutes/piriformis; skilled palpation and monitoring of soft tissue during DN  Trigger Point Dry Needling  Initial Treatment:  Pt instructed on Dry Needling rational, procedures, and possible side effects. Pt instructed to expect mild to moderate muscle soreness later in the day and/or into the next day.  Pt instructed in methods to reduce muscle soreness. Pt instructed to continue prescribed HEP. Patient was educated on signs and symptoms of infection and other risk factors and advised to seek medical attention should they occur.  Patient verbalized understanding of these instructions and education.   Patient Verbal Consent Given: Yes Education Handout Provided: Yes Muscles Treated: Lt piriformis Electrical Stimulation Performed: No Treatment Response/Outcome: twitch responses with decreased pain following    PATIENT EDUCATION:  Education details: HEP, DN Person educated: Patient Education method: Programmer, Multimedia, Facilities Manager, and Handouts Education comprehension: verbalized understanding, returned demonstration, and needs further education  HOME EXERCISE PROGRAM: Access Code: URL: [email or web address] Date: 12/27/2023 Prepared by: Lamar Ivory   Exercises - Single Knee to Chest Stretch  - 2 x daily - 7 x weekly - 1 sets - 5 reps - 20 seconds hold - Supine Hamstring Stretch  - 2 x daily - 7 x weekly - 1 sets - 5 reps - 20 seconds hold - Supine Figure 4 Piriformis Stretch  - 2 x daily - 7 x weekly - 1 sets - 5 reps - 20 seconds hold - Standing Hip Hiking  - 5 x daily - 7 x weekly - 1 sets - 10 reps - 5 seconds hold   ASSESSMENT:  CLINICAL IMPRESSION: Patient is a 68 y.o. female who was seen today for physical therapy evaluation and treatment for Lt glute  and hip pain. She demonstrates decreased strength, mild gait abnormalities and continued pain affecting functional mobility.  She will benefit from PT to address deficits listed.    OBJECTIVE IMPAIRMENTS: Abnormal gait, decreased mobility, decreased strength, increased fascial restrictions, increased muscle spasms, and pain.   ACTIVITY LIMITATIONS: sitting, standing, squatting, sleeping, bed mobility, and locomotion level  PARTICIPATION LIMITATIONS: meal prep, cleaning, driving, shopping, community activity, and yard work  PERSONAL FACTORS: Age, Past/current experiences, Time since onset of injury/illness/exacerbation, and 3+ comorbidities: Anxiety, hx skin cancer, DM, HTN, hx knee scope are also affecting patient's functional outcome.   REHAB POTENTIAL: Good  CLINICAL DECISION MAKING: Evolving/moderate complexity  EVALUATION COMPLEXITY: Moderate   GOALS: Goals reviewed with patient? Yes  SHORT TERM GOALS: Target date: 03/20/2024  Independent with initial HEP Goal status: INITIAL   LONG TERM GOALS: Target date: 04/10/2024  Independent with final HEP Goal status: INITIAL  2.  PSFS score improved by 2 points Goal status: INITIAL  3.  Lt hip strength improved to 4+/5 for improved function and mobility Goal status: INIITAL  4.  Report pain < 3/10 with sleeping and walking for improved function Goal status: INITIAL   PLAN:  PT FREQUENCY: 1x/week  PT DURATION: 6 weeks  PLANNED INTERVENTIONS: 97164- PT Re-evaluation, 97750- Physical Performance Testing, 97110-Therapeutic exercises, 97530- Therapeutic activity, 97112- Neuromuscular re-education, 97535- Self Care, [id number]- Manual therapy, [phone]- Aquatic Therapy, [id number]- Electrical stimulation (unattended), 97035- Ultrasound, [id number]- Ionotophoresis 4mg /ml Dexamethasone , 79439 (1-2 muscles), 20561 (3+ muscles)- Dry Needling, Patient/Family education, Taping, Joint mobilization, Cryotherapy, and Moist heat.  PLAN FOR NEXT SESSION:  Review HEP, assess response to DN and repeat PRN   NEXT MD VISIT: PRN - after a few sessions of PT will need to schedule   Corean JULIANNA Ku, PT, DPT 02/28/24 10:25 AM   Date of referral: 02/18/24 Referring provider: Burnetta Brunet, DO Referring diagnosis? G57.02 Treatment diagnosis? (if different than referring diagnosis)  M25.552, M62.81, R26.2  What was this (referring dx) caused by? Ongoing Issue and Arthritis  Lysle of Condition: Initial Onset (within last 3 months)   Laterality: Lt  Current Functional Measure Score: Patient Specific Functional Scale 4.33  Objective measurements identify impairments when they are compared to normal values, the uninvolved extremity, and prior level of function.  [x]  Yes  []  No  Objective assessment of functional ability: Moderate functional limitations   Briefly describe symptoms: Lt hip pain affecting functional mobility  How did symptoms start: insidious  Average pain intensity:  Last 24 hours: 6  Past week: 8  How often does the pt experience symptoms? Constantly  How much have the symptoms interfered with usual daily activities? Quite a bit  How has condition changed since care began at this facility? NA - initial visit  In general, how is the patients overall health? Very Good   BACK PAIN (STarT Back Screening Tool) No

## 2024-03-02 ENCOUNTER — Encounter: Payer: Self-pay | Admitting: Radiology

## 2024-03-04 ENCOUNTER — Encounter: Payer: Self-pay | Admitting: Sports Medicine

## 2024-03-04 ENCOUNTER — Encounter: Payer: Self-pay | Admitting: Physical Therapy

## 2024-03-04 ENCOUNTER — Ambulatory Visit: Admitting: Physical Therapy

## 2024-03-04 DIAGNOSIS — M25552 Pain in left hip: Secondary | ICD-10-CM | POA: Diagnosis not present

## 2024-03-04 DIAGNOSIS — M6281 Muscle weakness (generalized): Secondary | ICD-10-CM | POA: Diagnosis not present

## 2024-03-04 DIAGNOSIS — R262 Difficulty in walking, not elsewhere classified: Secondary | ICD-10-CM | POA: Diagnosis not present

## 2024-03-04 NOTE — Therapy (Signed)
 OUTPATIENT PHYSICAL THERAPY TREATMENT   Patient Name: Mercedes Webb MRN: [id number] DOB:October 27, 1955, 68 y.o., female Today's Date: 03/04/2024  END OF SESSION:  PT End of Session - 03/04/24 1057     Visit Number 2    Number of Visits 16    Date for Recertification  04/10/24    Authorization Type [employer] Medicare    Progress Note Due on Visit 10    PT Start Time 1015    PT Stop Time 1045    PT Time Calculation (min) 30 min    Activity Tolerance Patient tolerated treatment well;No increased pain    Behavior During Therapy [facility] for tasks assessed/performed           Past Medical History:  Diagnosis Date   Anxiety    Cancer (HCC) 2016   Skin CA squamous cell on bilateral feet removed   Cervical dysplasia    h/o   Colon polyps    polyps on first scope ~ 2006, no recurrent polyps ~ 2001 and in 2016.    Diabetes mellitus without complication (HCC)    Endometriosis    Family history of adverse reaction to anesthesia    Mother had difficulty waking & nausea   Headache    Hypertension    Insomnia    Pancreatitis 01/2016   Recurrent cold sores    Past Surgical History:  Procedure Laterality Date   ABDOMINAL HYSTERECTOMY     tah/bso- endometriosis   CHOLECYSTECTOMY     ERCP N/A 02/03/2016   Procedure: ENDOSCOPIC RETROGRADE CHOLANGIOPANCREATOGRAPHY (ERCP);  Surgeon: Belvie Just, MD;  Location: [facility] ENDOSCOPY;  Service: Endoscopy;  Laterality: N/A;   KNEE ARTHROSCOPY     tonsillectomy     TONSILLECTOMY     TUBAL LIGATION     Patient Active Problem List   Diagnosis Date Noted   Pain in left hip 01/04/2022   Trochanteric bursitis, left hip 11/07/2021   Microalbuminuria due to type 2 diabetes mellitus (HCC) 06/17/2019   Controlled type 2 diabetes mellitus without complication, with long-term current use of insulin  (HCC) 10/23/2018   Secondary hyperhidrosis- not focal in nature but primarily from waist up is the worst 08/20/2018   Low serum progesterone  05/30/2018    Excessive sweating- likely due to hormonal 11/11/2017   Elevated serum creatinine 09/04/2017   Elevated ALT measurement 09/04/2017   Postmenopausal- 30 yrs ago TAH yrs ago 11/12/2016   Hormone imbalance- txed by gyn 11/12/2016   Primary insomnia 11/12/2016   Mood disorder (HCC)- mixed anxiety and depression 11/12/2016   Vitamin D  deficiency 09/28/2016   Mixed diabetic hyperlipidemia associated with type 2 diabetes mellitus (HCC) 09/28/2016   Hypokalemia 02/01/2016   Hypertension associated with diabetes (HCC) 01/31/2016   Generalized anxiety disorder 01/31/2016   Surgical menopause 12/23/2014   Status post abdominal hysterectomy 12/23/2014   History of cervical dysplasia 12/23/2014   SUI (stress urinary incontinence, female) 12/23/2014   Obesity, Class I, BMI 30-34.9 12/23/2014    PCP: Gayle Saddie FALCON, PA-C  REFERRING PROVIDER: Burnetta Brunet, DO  REFERRING DIAG: G57.02 (ICD-10-CM) - Piriformis syndrome of left side  Rationale for Evaluation and Treatment: Rehabilitation  THERAPY DIAG:  Pain in left hip  Muscle weakness (generalized)  Difficulty in walking, not elsewhere classified  ONSET DATE: chronic x 3 years   SUBJECTIVE:  SUBJECTIVE STATEMENT: Was pretty sore after last session; feels about the same but did think dry needling was beneficial.    PERTINENT HISTORY:  Anxiety, hx skin cancer, DM, HTN, hx knee scope  PAIN:  Are you having pain? Yes: NPRS scale: 0 currently, up to 6, at best 0/10  Pain location: Lt buttock Pain description: burning, deep  Aggravating factors: lying on Lt side, walking Relieving factors: ibuprofen, ice packs, massage  PRECAUTIONS:  None  RED FLAGS: None   WEIGHT BEARING RESTRICTIONS:  No  FALLS:  Has patient fallen in last 6 months?  No  LIVING ENVIRONMENT: Lives with: lives alone Lives in: House/apartment Stairs: Yes: External: 7 steps; can reach both   OCCUPATION:  retired  PLOF:  Independent and Leisure: go to [employer]  PATIENT GOALS:  Improve pain   OBJECTIVE:  Note: Objective measures were completed at Evaluation unless otherwise noted.  DIAGNOSTIC FINDINGS:  MRI of left hip which did not show any specific tendinopathy or acute hip pathology to explain cause of pain  PATIENT SURVEYS:  Patient-Specific Activity Scoring Scheme  0 represents "unable to perform." 10 represents "able to perform at prior level. 0 1 2 3 4 5 6 7 8 9  10 (Date and Score)   Activity Eval     1. Yard work 5    2. Lying on left side 0    3. Ascend stairs 8   Score 4.33    Total score = sum of the activity scores/number of activities Minimum detectable change (90%CI) for average score = 2 points Minimum detectable change (90%CI) for single activity score = 3 points    COGNITIVE STATUS: Within functional limits for tasks assessed   SENSATION: WFL  POSTURE:  rounded shoulders and forward head   GAIT: 02/28/24 Comments: mild trendelenburg bil   PALPATION: 02/28/24 trigger point and pain along Lt piriformis   LOWER EXTREMITY MMT:     MMT Right eval Left eval  Hip flexion 4/5 4/5  Hip extension  3/5  Hip abduction  3/5  Hip internal rotation 4/5 4/5  Hip external rotation 4/5 3+/5   (Blank rows = not tested)      SPECIAL TESTS:  02/28/24 Lower Extremity Hip special tests: [facility]) test: negative and Piriformis test: positive    TREATMENT:                                                                                                                              DATE:  03/04/24 Manual STM with compression to Lt glutes/piriformis; skilled palpation and monitoring of soft tissue during DN Discussed options of percussive devices, IASTM with tennis ball in sitting and  standing  Trigger Point Dry Needling  Subsequent Treatment: Instructions provided previously at initial dry needling treatment.   Patient Verbal Consent Given: Yes Education Handout Provided: Previously Provided Muscles Treated: Lt piriformis Electrical Stimulation Performed: No Treatment Response/Outcome: twitch responses with decreased pain following  TherEx Reviewed prior HEP with slight modifications to piriformis stretch; as well as addition of lateral hip strengthening exercises - see below for details; trial reps performed with mod cues for technique    02/28/24 TherEx See HEP from 12/27/23 visit - pt verbalized understanding of exercises  Self Care Educated on clinical findings and PT POC  Manual STM with compression to Lt glutes/piriformis; skilled palpation and monitoring of soft tissue during DN  Trigger Point Dry Needling  Initial Treatment: Pt instructed on Dry Needling rational, procedures, and possible side effects. Pt instructed to expect mild to moderate muscle soreness later in the day and/or into the next day.  Pt instructed in methods to reduce muscle soreness. Pt instructed to continue prescribed HEP. Patient was educated on signs and symptoms of infection and other risk factors and advised to seek medical attention should they occur.  Patient verbalized understanding of these instructions and education.   Patient Verbal Consent Given: Yes Education Handout Provided: Yes Muscles Treated: Lt piriformis Electrical Stimulation Performed: No Treatment Response/Outcome: twitch responses with decreased pain following    PATIENT EDUCATION:  Education details: HEP, DN Person educated: Patient Education method: Programmer, Multimedia, Facilities Manager, and Handouts Education comprehension: verbalized understanding, returned demonstration, and needs further education  HOME EXERCISE PROGRAM: Access Code: URL: [email or web address] Date:  03/04/2024 Prepared by: Corean Ku  Exercises - Single Knee to Chest Stretch  - 2 x daily - 7 x weekly - 1 sets - 5 reps - 20 seconds hold - Supine Hamstring Stretch  - 2 x daily - 7 x weekly - 1 sets - 5 reps - 20 seconds hold - Standing Hip Hiking  - 5 x daily - 7 x weekly - 1 sets - 10 reps - 5 seconds hold - Seated Piriformis Stretch with Trunk Bend  - 2-3 x daily - 7 x weekly - 1 sets - 3 reps - 30 sec hold - Supine Figure 4 Piriformis Stretch  - 2-3 x daily - 7 x weekly - 1 sets - 3 reps - 30 sec hold - Sidelying Hip Circles  - 2 x daily - 7 x weekly - 2 sets - 10 circles each way - Self Release   - 2-3 x daily - 7 x weekly - 1 sets - 1 reps - 3-5 min hold   ASSESSMENT:  CLINICAL IMPRESSION: Pt reports mild improvement in symptoms from last session, and requested repeat of DN today.  Did modify HEP today as well.  Will continue to benefit from PT to maximize function, pt may reach out to physician to discuss additional treatment options.    OBJECTIVE IMPAIRMENTS: Abnormal gait, decreased mobility, decreased strength, increased fascial restrictions, increased muscle spasms, and pain.   ACTIVITY LIMITATIONS: sitting, standing, squatting, sleeping, bed mobility, and locomotion level  PARTICIPATION LIMITATIONS: meal prep, cleaning, driving, shopping, community activity, and yard work  PERSONAL FACTORS: Age, Past/current experiences, Time since onset of injury/illness/exacerbation, and 3+ comorbidities: Anxiety, hx skin cancer, DM, HTN, hx knee scope are also affecting patient's functional outcome.   REHAB POTENTIAL: Good  CLINICAL DECISION MAKING: Evolving/moderate complexity  EVALUATION COMPLEXITY: Moderate   GOALS: Goals reviewed with patient? Yes  SHORT TERM GOALS: Target date: 03/20/2024  Independent with initial HEP Goal status: INITIAL   LONG TERM GOALS: Target date: 04/10/2024  Independent with final HEP Goal status: INITIAL  2.  PSFS score improved by  2 points Goal status: INITIAL  3.  Lt hip strength improved to 4+/5 for improved function  and mobility Goal status: INIITAL  4.  Report pain < 3/10 with sleeping and walking for improved function Goal status: INITIAL   PLAN:  PT FREQUENCY: 1x/week  PT DURATION: 6 weeks  PLANNED INTERVENTIONS: 97164- PT Re-evaluation, 97750- Physical Performance Testing, 97110-Therapeutic exercises, 97530- Therapeutic activity, [id number]- Neuromuscular re-education, 97535- Self Care, 02859- Manual therapy, [phone]- Aquatic Therapy, H9716- Electrical stimulation (unattended), [id number]- Ultrasound, [id number]- Ionotophoresis 4mg /ml Dexamethasone , 79439 (1-2 muscles), 20561 (3+ muscles)- Dry Needling, Patient/Family education, Taping, Joint mobilization, Cryotherapy, and Moist heat.  PLAN FOR NEXT SESSION: Review HEP PRN, assess response to DN and repeat PRN   NEXT MD VISIT: PRN - after a few sessions of PT will need to schedule   Corean JULIANNA Ku, PT, DPT 03/04/24 10:57 AM   Date of referral: 02/18/24 Referring provider: Burnetta Brunet, DO Referring diagnosis? G57.02 Treatment diagnosis? (if different than referring diagnosis) M25.552, M62.81, R26.2  What was this (referring dx) caused by? Ongoing Issue and Arthritis  Lysle of Condition: Initial Onset (within last 3 months)   Laterality: Lt  Current Functional Measure Score: Patient Specific Functional Scale 4.33  Objective measurements identify impairments when they are compared to normal values, the uninvolved extremity, and prior level of function.  [x]  Yes  []  No  Objective assessment of functional ability: Moderate functional limitations   Briefly describe symptoms: Lt hip pain affecting functional mobility  How did symptoms start: insidious  Average pain intensity:  Last 24 hours: 6  Past week: 8  How often does the pt experience symptoms? Constantly  How much have the symptoms interfered with usual daily activities? Quite a  bit  How has condition changed since care began at this facility? NA - initial visit  In general, how is the patients overall health? Very Good   BACK PAIN (STarT Back Screening Tool) No

## 2024-03-13 ENCOUNTER — Encounter: Payer: Self-pay | Admitting: Sports Medicine

## 2024-03-13 ENCOUNTER — Ambulatory Visit: Admitting: Sports Medicine

## 2024-03-13 ENCOUNTER — Other Ambulatory Visit: Payer: Self-pay

## 2024-03-13 DIAGNOSIS — M7918 Myalgia, other site: Secondary | ICD-10-CM | POA: Diagnosis not present

## 2024-03-13 DIAGNOSIS — G5702 Lesion of sciatic nerve, left lower limb: Secondary | ICD-10-CM | POA: Diagnosis not present

## 2024-03-13 NOTE — Progress Notes (Addendum)
 Mercedes Webb - 68 y.o. female MRN [id number]  Date of birth: 1955/06/13  Office Visit Note: Visit Date: 03/13/2024 PCP: Gayle Saddie FALCON, PA-C Referred by: Gayle Saddie FALCON, PA-C  Subjective: No chief complaint on file.  HPI: Mercedes Webb is a pleasant 68 y.o. female who presents today for acute on chronic left posterior buttock pain.  [name] continues with pain over the left side the posterior buttock. She has had several treatments which gave her partial or temporary relief but nothing long-lasting including landmark guided left gluteal/piriformis injection, dry needling, physical therapy and over-the-counter medications.  Will radiate slightly to the proximal hamstring but nothing down past the knee.  She continues with her home exercises/stretching on her own.  Pertinent ROS were reviewed with the patient and found to be negative unless otherwise specified above in HPI.   Assessment & Plan: Visit Diagnoses:  1. Compression of left sciatic nerve   2. Piriformis syndrome of left side   3. Pain in left buttock    Plan: Impression is acute on chronic left posterior buttock pain with pain emanating near the piriformis with functional compression of the exiting sciatic nerve.  She has received temporary relief from landmark guided piriformis injection alone as well as PT, dry needling and other rehab.  For both diagnostic and hopefully therapeutic purposes, we did perform ultrasound-guided sciatic nerve Hydrodissection inferior to the overlying piriformis musculature.  Patient tolerated well.  Advised on postinjection protocol.  May use ice/heat and/or Tylenol as needed for pain control.  I would like her to have modified rest/activity through this weekend, starting on Monday she may begin her PT/home exercises.  She will send me a message in 2 weeks reporting her degree of improvement (0-100%).  This will help guide further management depending on her degree of relief.  If this is  significantly helpful but still having issues, there could be a role for subsequent repeat hydrodissection injections.  Follow-up: Return for Will send me mychart message in 2 weeks .   Meds & Orders: No orders of the defined types were placed in this encounter.   Orders Placed This Encounter  Procedures   US  Guided Needle Placement - No Linked Charges     Procedures: US -guided sciatic nerve hydrodissection (neurolysis), Left leg/Piriformis: After discussion on risks/benefits/indications, informed verbal consent was obtained. A timeout was then performed. Patient was placed in prone position on the table in exam room with affected leg facing upward in a neutral position. The overlying area was prepped with ChloraPrep and multiple alcohol swabs. Utilizing ultrasound guidance, the sciatic nerve and surrounding neurovasculature was identified inferior to the piriformis muscle. Following identification, using ultrasound guidance via an in-plane approach, a 22-gauge, 3.5 inch needle was inserted from a lateral to medial direction around the exiting sciatic nerve. Using a mixture of 3:3:3:2 cc's of lidocaine :bupivicaine:D5W:betamethasone, the nerve was hydrodissected. The needle was inserted both above and below the nerve with ultrasound visualization of 360 degrees spread and appropriate hydrodissection and lysis of the nerve. Patient tolerated the procedure well without immediate complication.  Band-Aid was applied.           Clinical History: No specialty comments available.  She reports that she quit smoking about 35 years ago. Her smoking use included cigarettes. She started smoking about 53 years ago. She has a 9 pack-year smoking history. She has never been exposed to tobacco smoke. She has never used smokeless tobacco.  Recent Labs    03/19/23 1526 06/25/23  1025 11/29/23 1019  HGBA1C 5.7 5.2 5.1    Objective:    Physical Exam  Gen: Well-appearing, in no acute distress;  non-toxic CV: Well-perfused. Warm.  Resp: Breathing unlabored on room air; no wheezing. Psych: Fluid speech in conversation; appropriate affect; normal thought process  Ortho Exam - Left buttock: Positive TTP with deep palpation within the posterior buttock near the gluteal musculature in the overlying piriformis.  Positive Tinel's in this location.  Negative straight leg raise.  No redness swelling or effusion present.  Imaging:  Narrative & Impression  MR HIP WITHOUT IV CONTRAST LEFT   COMPARISON: None.   CLINICAL HISTORY: Left hip pain, chronic   PULSE SEQUENCES: AX T1, Ax T2 FS, Cor T1, COR STIR & SMALL FOV COR PD FS without contrast.   FINDINGS:   Bones and labrum: There is no fracture or contusion pattern. No accelerated arthrosis is present. No subchondral reactive edema or significant joint effusion is present. The labrum is unremarkable.   Musculotendinous structures: No significant tendinosis or myositis is present. No bursal collection.   IMPRESSION: Unremarkable MRI of the left hip.   Electronically signed by: Norleen Satchel MD 02/10/2024 12:19 PM EDT RP Workstation: [id number]    Past Medical/Family/Surgical/Social History: Medications & Allergies reviewed per EMR, new medications updated. Patient Active Problem List   Diagnosis Date Noted   Pain in left hip 01/04/2022   Trochanteric bursitis, left hip 11/07/2021   Microalbuminuria due to type 2 diabetes mellitus (HCC) 06/17/2019   Controlled type 2 diabetes mellitus without complication, with long-term current use of insulin  (HCC) 10/23/2018   Secondary hyperhidrosis- not focal in nature but primarily from waist up is the worst 08/20/2018   Low serum progesterone  05/30/2018   Excessive sweating- likely due to hormonal 11/11/2017   Elevated serum creatinine 09/04/2017   Elevated ALT measurement 09/04/2017   Postmenopausal- 30 yrs ago TAH yrs ago 11/12/2016   Hormone imbalance- txed by gyn 11/12/2016    Primary insomnia 11/12/2016   Mood disorder (HCC)- mixed anxiety and depression 11/12/2016   Vitamin D  deficiency 09/28/2016   Mixed diabetic hyperlipidemia associated with type 2 diabetes mellitus (HCC) 09/28/2016   Hypokalemia 02/01/2016   Hypertension associated with diabetes (HCC) 01/31/2016   Generalized anxiety disorder 01/31/2016   Surgical menopause 12/23/2014   Status post abdominal hysterectomy 12/23/2014   History of cervical dysplasia 12/23/2014   SUI (stress urinary incontinence, female) 12/23/2014   Obesity, Class I, BMI 30-34.9 12/23/2014   Past Medical History:  Diagnosis Date   Anxiety    Cancer (HCC) 2016   Skin CA squamous cell on bilateral feet removed   Cervical dysplasia    h/o   Colon polyps    polyps on first scope ~ 2006, no recurrent polyps ~ 2001 and in 2016.    Diabetes mellitus without complication (HCC)    Endometriosis    Family history of adverse reaction to anesthesia    Mother had difficulty waking & nausea   Headache    Hypertension    Insomnia    Pancreatitis 01/2016   Recurrent cold sores    Family History  Problem Relation Age of Onset   Diabetes Father    Heart disease Father    Breast cancer Sister 34   Diabetes Brother    Colon cancer Neg Hx    Ovarian cancer Neg Hx    Past Surgical History:  Procedure Laterality Date   ABDOMINAL HYSTERECTOMY     tah/bso- endometriosis   CHOLECYSTECTOMY  ERCP N/A 02/03/2016   Procedure: ENDOSCOPIC RETROGRADE CHOLANGIOPANCREATOGRAPHY (ERCP);  Surgeon: Belvie Just, MD;  Location: [facility] ENDOSCOPY;  Service: Endoscopy;  Laterality: N/A;   KNEE ARTHROSCOPY     tonsillectomy     TONSILLECTOMY     TUBAL LIGATION     Social History   Occupational History   Not on file  Tobacco Use   Smoking status: Former    Current packs/day: 0.00    Average packs/day: 0.5 packs/day for 18.0 years (9.0 ttl pk-yrs)    Types: Cigarettes    Start date: 04/30/1970    Quit date: 04/30/1988    Years since  quitting: 35.8    Passive exposure: Never   Smokeless tobacco: Never  Vaping Use   Vaping status: Never Used  Substance and Sexual Activity   Alcohol use: Not Currently   Drug use: Yes    Types: Hashish   Sexual activity: Yes    Partners: Male    Birth control/protection: Surgical

## 2024-03-17 ENCOUNTER — Ambulatory Visit: Admitting: Sports Medicine

## 2024-03-18 ENCOUNTER — Other Ambulatory Visit: Payer: Self-pay

## 2024-03-18 DIAGNOSIS — E1169 Type 2 diabetes mellitus with other specified complication: Secondary | ICD-10-CM

## 2024-03-18 LAB — OPHTHALMOLOGY REPORT-SCANNED

## 2024-03-27 ENCOUNTER — Other Ambulatory Visit: Payer: Self-pay

## 2024-03-27 ENCOUNTER — Other Ambulatory Visit: Payer: Self-pay | Admitting: Family Medicine

## 2024-03-27 DIAGNOSIS — B009 Herpesviral infection, unspecified: Secondary | ICD-10-CM

## 2024-03-27 DIAGNOSIS — E1169 Type 2 diabetes mellitus with other specified complication: Secondary | ICD-10-CM

## 2024-03-27 DIAGNOSIS — E119 Type 2 diabetes mellitus without complications: Secondary | ICD-10-CM

## 2024-03-27 DIAGNOSIS — I152 Hypertension secondary to endocrine disorders: Secondary | ICD-10-CM

## 2024-03-27 DIAGNOSIS — E66811 Obesity, class 1: Secondary | ICD-10-CM

## 2024-04-06 ENCOUNTER — Encounter: Payer: Self-pay | Admitting: Sports Medicine

## 2024-04-20 ENCOUNTER — Other Ambulatory Visit: Payer: Self-pay

## 2024-04-20 DIAGNOSIS — E1169 Type 2 diabetes mellitus with other specified complication: Secondary | ICD-10-CM

## 2024-04-22 ENCOUNTER — Encounter: Payer: Self-pay | Admitting: Sports Medicine

## 2024-04-22 ENCOUNTER — Ambulatory Visit: Admitting: Sports Medicine

## 2024-04-22 ENCOUNTER — Other Ambulatory Visit: Payer: Self-pay

## 2024-04-22 DIAGNOSIS — M7918 Myalgia, other site: Secondary | ICD-10-CM | POA: Diagnosis not present

## 2024-04-22 DIAGNOSIS — G5702 Lesion of sciatic nerve, left lower limb: Secondary | ICD-10-CM

## 2024-04-22 NOTE — Progress Notes (Addendum)
 "  Mercedes Webb - 68 y.o. female MRN [id number]  Date of birth: May 22, 1955  Office Visit Note: Visit Date: 04/22/2024 PCP: Gayle Saddie FALCON, PA-C Referred by: Gayle Saddie FALCON, PA-C  Subjective: Chief Complaint  Patient presents with   Left Hip - Follow-up   HPI: Mercedes Webb is a pleasant 68 y.o. female who presents today for follow-up of chronic left posterior buttock pain with piriformis syndrome and functional sciatic nerve compression.  We did perform our first ultrasound-guided sciatic nerve Hydrodissection back in early November which certainly gave her very good relief (at least 65%+) and still overall is feeling better although symptoms starting to return.  She is interested in proceeding with additional procedures.  This has been helpful for her as a lot of other treatment including dry needling, physical therapy and over-the-counter medication has not significantly helped her pain.   Lab Results  Component Value Date   HGBA1C 5.1 11/29/2023   Pertinent ROS were reviewed with the patient and found to be negative unless otherwise specified above in HPI.   Assessment & Plan: Visit Diagnoses:  1. Compression of left sciatic nerve   2. Piriformis syndrome of left side   3. Pain in left buttock    Plan: Impression is chronic left posterior buttock pain with piriformis syndrome and functional compression of the exiting sciatic nerve.  She did receive good relief for the first time after previous ultrasound-guided sciatic nerve Hydrodissection (inferior to piriformis muscle) but slowly started to wane in effect and not full relief.  Discussed the nature of subsequent Hydro dissections to help fully release the nerve and improve her pain.  Through shared decision making, we did perform ultrasound-guided sciatic nerve hydrodissection superior to the overlying piriformis musculature today.  Advised on postinjection protocol.  May use ice/heat and/or Tylenol as needed for pain control.   She will rest through the remainder of the month and then starting in early January may begin her home PT exercises.  We will see her back in about 2 weeks for reevaluation.  Could consider 1 additional high-volume junction inferior to piriformis only if not significantly improved.  Follow-up: Return for already has f/u in 2 weeks.   Meds & Orders: No orders of the defined types were placed in this encounter.   Orders Placed This Encounter  Procedures   US  Guided Needle Placement - No Linked Charges     Procedures: US -guided sciatic nerve hydrodissection (neurolysis), Left leg/Piriformis: After discussion on risks/benefits/indications, informed verbal consent was obtained. A timeout was then performed. Patient was placed in prone position on the table in exam room with affected leg facing upward in a neutral position. The overlying area was prepped with ChloraPrep and multiple alcohol swabs. Utilizing ultrasound guidance, the sciatic nerve and surrounding neurovasculature was identified inferior to the piriformis muscle. Following identification, using ultrasound guidance via an in-plane approach, a 22-gauge, 3.5 inch needle was inserted from a lateral to medial direction around the exiting sciatic nerve. Using a mixture of 3:3:12:1 cc's of lidocaine :bupivicaine:D5W:betamethasone, the nerve was hydrodissected. The needle was inserted both above and below the nerve with ultrasound visualization of 360 degrees spread and appropriate hydrodissection and lysis of the nerve. Patient tolerated the procedure well without immediate complication.  Band-Aid was applied.        Clinical History: No specialty comments available.  She reports that she quit smoking about 36 years ago. Her smoking use included cigarettes. She started smoking about 54 years ago. She has  a 9 pack-year smoking history. She has never been exposed to tobacco smoke. She has never used smokeless tobacco.  Recent Labs     06/25/23 1025 11/29/23 1019  HGBA1C 5.2 5.1    Objective:    Physical Exam  Gen: Well-appearing, in no acute distress; non-toxic CV: Well-perfused. Warm.  Resp: Breathing unlabored on room air; no wheezing. Psych: Fluid speech in conversation; appropriate affect; normal thought process  Ortho Exam - Left buttock/gluteal region: + TTP with deep palpation in the posterior buttock near the overlying piriformis and gluteal musculature.  There is no specific tenderness over the greater trochanteric region.  Fluid motion with internal and external rotation.  There is relatively well-preserved hip abduction strength testing and hip extension.  Imaging: No results found.  Past Medical/Family/Surgical/Social History: Medications & Allergies reviewed per EMR, new medications updated. Patient Active Problem List   Diagnosis Date Noted   Pain in left hip 01/04/2022   Trochanteric bursitis, left hip 11/07/2021   Microalbuminuria due to type 2 diabetes mellitus (HCC) 06/17/2019   Controlled type 2 diabetes mellitus without complication, with long-term current use of insulin  (HCC) 10/23/2018   Secondary hyperhidrosis- not focal in nature but primarily from waist up is the worst 08/20/2018   Low serum progesterone  05/30/2018   Excessive sweating- likely due to hormonal 11/11/2017   Elevated serum creatinine 09/04/2017   Elevated ALT measurement 09/04/2017   Postmenopausal- 30 yrs ago TAH yrs ago 11/12/2016   Hormone imbalance- txed by gyn 11/12/2016   Primary insomnia 11/12/2016   Mood disorder (HCC)- mixed anxiety and depression 11/12/2016   Vitamin D  deficiency 09/28/2016   Mixed diabetic hyperlipidemia associated with type 2 diabetes mellitus (HCC) 09/28/2016   Hypokalemia 02/01/2016   Hypertension associated with diabetes (HCC) 01/31/2016   Generalized anxiety disorder 01/31/2016   Surgical menopause 12/23/2014   Status post abdominal hysterectomy 12/23/2014   History of cervical  dysplasia 12/23/2014   SUI (stress urinary incontinence, female) 12/23/2014   Obesity, Class I, BMI 30-34.9 12/23/2014   Past Medical History:  Diagnosis Date   Anxiety    Cancer (HCC) 2016   Skin CA squamous cell on bilateral feet removed   Cervical dysplasia    h/o   Colon polyps    polyps on first scope ~ 2006, no recurrent polyps ~ 2001 and in 2016.    Diabetes mellitus without complication (HCC)    Endometriosis    Family history of adverse reaction to anesthesia    Mother had difficulty waking & nausea   Headache    Hypertension    Insomnia    Pancreatitis 01/2016   Recurrent cold sores    Family History  Problem Relation Age of Onset   Diabetes Father    Heart disease Father    Breast cancer Sister 7   Diabetes Brother    Colon cancer Neg Hx    Ovarian cancer Neg Hx    Past Surgical History:  Procedure Laterality Date   ABDOMINAL HYSTERECTOMY     tah/bso- endometriosis   CHOLECYSTECTOMY     ERCP N/A 02/03/2016   Procedure: ENDOSCOPIC RETROGRADE CHOLANGIOPANCREATOGRAPHY (ERCP);  Surgeon: Belvie Just, MD;  Location: [facility] ENDOSCOPY;  Service: Endoscopy;  Laterality: N/A;   KNEE ARTHROSCOPY     tonsillectomy     TONSILLECTOMY     TUBAL LIGATION     Social History   Occupational History   Not on file  Tobacco Use   Smoking status: Former    Current packs/day:  0.00    Average packs/day: 0.5 packs/day for 18.0 years (9.0 ttl pk-yrs)    Types: Cigarettes    Start date: 04/30/1970    Quit date: 04/30/1988    Years since quitting: 36.0    Passive exposure: Never   Smokeless tobacco: Never  Vaping Use   Vaping status: Never Used  Substance and Sexual Activity   Alcohol use: Not Currently   Drug use: Yes    Types: Hashish   Sexual activity: Yes    Partners: Male    Birth control/protection: Surgical   "

## 2024-05-04 ENCOUNTER — Ambulatory Visit

## 2024-05-04 VITALS — BP 108/67 | HR 85 | Temp 97.6°F | Ht 67.0 in | Wt 154.0 lb

## 2024-05-04 DIAGNOSIS — E782 Mixed hyperlipidemia: Secondary | ICD-10-CM

## 2024-05-04 DIAGNOSIS — E1129 Type 2 diabetes mellitus with other diabetic kidney complication: Secondary | ICD-10-CM | POA: Diagnosis not present

## 2024-05-04 DIAGNOSIS — Z794 Long term (current) use of insulin: Secondary | ICD-10-CM | POA: Diagnosis not present

## 2024-05-04 DIAGNOSIS — F39 Unspecified mood [affective] disorder: Secondary | ICD-10-CM | POA: Diagnosis not present

## 2024-05-04 DIAGNOSIS — E66811 Obesity, class 1: Secondary | ICD-10-CM

## 2024-05-04 DIAGNOSIS — F5101 Primary insomnia: Secondary | ICD-10-CM | POA: Diagnosis not present

## 2024-05-04 DIAGNOSIS — H9110 Presbycusis, unspecified ear: Secondary | ICD-10-CM | POA: Insufficient documentation

## 2024-05-04 DIAGNOSIS — I152 Hypertension secondary to endocrine disorders: Secondary | ICD-10-CM

## 2024-05-04 DIAGNOSIS — E119 Type 2 diabetes mellitus without complications: Secondary | ICD-10-CM | POA: Diagnosis not present

## 2024-05-04 DIAGNOSIS — E1159 Type 2 diabetes mellitus with other circulatory complications: Secondary | ICD-10-CM | POA: Diagnosis not present

## 2024-05-04 DIAGNOSIS — H9113 Presbycusis, bilateral: Secondary | ICD-10-CM | POA: Diagnosis not present

## 2024-05-04 DIAGNOSIS — E1169 Type 2 diabetes mellitus with other specified complication: Secondary | ICD-10-CM

## 2024-05-04 DIAGNOSIS — R809 Proteinuria, unspecified: Secondary | ICD-10-CM | POA: Diagnosis not present

## 2024-05-04 LAB — POCT GLYCOSYLATED HEMOGLOBIN (HGB A1C): Hemoglobin A1C: 4.8 % (ref 4.0–5.6)

## 2024-05-04 LAB — POCT UA - MICROALBUMIN
Albumin/Creatinine Ratio, Urine, POC: <30
Creatinine, POC: 200 mg/dL
Microalbumin Ur, POC: 80 mg/L

## 2024-05-04 MED ORDER — BD PEN NEEDLE MINI ULTRAFINE 31G X 5 MM MISC: 100.0000 | Freq: Three times a day (TID) | 0 refills | Status: AC

## 2024-05-04 MED ORDER — ZOLPIDEM TARTRATE ER 12.5 MG PO TBCR: 12.5000 mg | EXTENDED_RELEASE_TABLET | Freq: Every day | ORAL | 0 refills | Status: AC

## 2024-05-04 MED ORDER — TRIAMTERENE-HCTZ 37.5-25 MG PO TABS: 1.0000 | ORAL_TABLET | Freq: Every day | ORAL | 2 refills | Status: AC

## 2024-05-04 MED ORDER — ASPIRIN 81 MG PO TBEC: 81.0000 mg | DELAYED_RELEASE_TABLET | Freq: Every day | ORAL | 2 refills | Status: AC

## 2024-05-04 MED ORDER — ACCU-CHEK SOFTCLIX LANCETS MISC: 0 refills | Status: DC

## 2024-05-04 MED ORDER — ACCU-CHEK GUIDE TEST VI STRP: ORAL_STRIP | 3 refills | Status: DC

## 2024-05-04 MED ORDER — TIRZEPATIDE 15 MG/0.5ML ~~LOC~~ SOAJ: 15.0000 mg | SUBCUTANEOUS | 2 refills | Status: AC

## 2024-05-04 MED ORDER — FENOFIBRATE 48 MG PO TABS: 48.0000 mg | ORAL_TABLET | Freq: Every day | ORAL | 2 refills | Status: AC

## 2024-05-04 NOTE — Patient Instructions (Signed)
 VISIT SUMMARY: During your visit, we discussed your diabetes management, recent weight loss, and a recent respiratory illness. We also reviewed your current medications and recent eye exam.  YOUR PLAN: TYPE 2 DIABETES MELLITUS: Your diabetes is well-controlled with an A1c of 4.7% and fasting blood sugars around 120 mg/dL. -Discontinue insulin . -Increase Mounjaro  to 15 mg. -Monitor fasting blood sugars after stopping insulin . -Contact us  if your blood sugars exceed 130-150 mg/dL. -Consider a patient assistance program for Mounjaro  if the cost is too high.  OBESITY, CLASS 1: You have lost 16-20 pounds since August through dietary changes and medication adjustments. -Continue your current dietary modifications. -Monitor your weight and dietary habits.  GENERAL HEALTH MAINTENANCE: You recently had an eye exam. -Continue with routine health maintenance. -Consider getting a flu vaccination. -Explore hearing aid options if needed.  If you have any problems before your next visit feel free to message me via MyChart (minor issues or questions) or call the office, otherwise you may reach out to schedule an office visit.  Thank you! Saddie Sacks, PA-C

## 2024-05-04 NOTE — Assessment & Plan Note (Signed)
BP goal <130/80. Stable, at goal.  Continue losartan and triamterene-hydrochlorothiazide 37.5-25 mg daily.  Will continue to monitor.

## 2024-05-04 NOTE — Assessment & Plan Note (Signed)
 In remission. Doing well since psychiatric provided discontinued venlafaxine . Patient's insurance stopped covering psychiatric appointments this year. Will continue to cover her CBT appointments. Will take over Zolpidem  script since patient will be losing coverage. PDMP reviewed. No aberrancies. Rx sent for 30 days.

## 2024-05-04 NOTE — Assessment & Plan Note (Signed)
 POC A1c today further decreased to 4.7.  -Advised that once she finished her current box of 12.5 mg Mounjaro , discontinue Humulin  completely and begin Mounjaro  15 mg weekly. (Previously on 3 units of Humulin  daily). With A1c of 4.7, hypoglycemia becomes a concern on insulin  so will discontinue and watch fasting blood sugars closely.   Contact provider if blood sugars exceed 130-150 mg/dL. - Consider patient assistance program for Mounjaro  if cost is prohibitive.

## 2024-05-04 NOTE — Assessment & Plan Note (Signed)
 Decreased hearing noted on exam today. Ear exam normal with exception of mild effusion behind left TM likely secondary to recent respiratory illness. Recommended flonase and zyrtec for 2 weeks to resolve this. Discussed self referral to audiology for hearing test. Beltone discussed as popular option.

## 2024-05-04 NOTE — Progress Notes (Signed)
 "  Established Patient Office Visit  Subjective   Patient ID: Mercedes Webb, female    DOB: [birth year]  Age: 69 y.o. MRN: [id number]  Chief Complaint  Patient presents with   Medical Management of Chronic Issues    HPI  Discussed the use of AI scribe software for clinical note transcription with the patient, who gave verbal consent to proceed.  History of Present Illness   Mercedes Webb is a 69 year old female with diabetes who presents for a routine follow-up visit.  Diabetes mellitus management - Diabetes is stable with well-controlled blood pressure and A1c levels, currently at 4.7%. - Administers three units of Humulin  insulin  daily in the morning. - Fasting blood glucose levels are maintained around 100-120 mg/dL. - No hypoglycemic episodes; blood sugars have not dropped below 90 mg/dL. - Mounjaro  dosage was increased at the last visit to 12.5. She is tolerating well without side effects.  - Requests refills for diabetic supplies including needles, lancets, and strips.  Weight loss and dietary changes - Weight loss of approximately 16 pounds since August, previously weighed 170 pounds. - Discontinued anti-anxiety medication, which she attributes to part of her weight loss. - Focusing on dietary improvements, including use of Splenda as a sugar substitute.  Recent respiratory illness - Experienced a respiratory illness lasting approximately two weeks. - Symptoms included persistent cough and nasal congestion. - No fever or colored nasal discharge. - Symptoms have improved. - Boyfriend was ill first; she suspects transmission from him.  Medication management - Current medications include losartan , triamterene  hydrochlorothiazide , aspirin , fenofibrate , atorvastatin , and zolpidem  for sleep. - Requests refills for some medications. - Previously obtained zolpidem  from a provider in [place] who is no longer in her network.  Ophthalmologic care - Recently had an  eye exam with Dr. Lorrene Devonshire at [facility], [facility].          ROS Per HPI.    Objective:     BP 108/67   Pulse 85   Temp 97.6 F (36.4 C) (Oral)   Ht 5' 7 (1.702 m)   Wt 154 lb 0.6 oz (69.9 kg)   SpO2 99%   BMI 24.13 kg/m    Physical Exam Constitutional:      General: She is not in acute distress.    Appearance: Normal appearance.  HENT:     Right Ear: Tympanic membrane and ear canal normal. There is no impacted cerumen.     Left Ear: Ear canal normal. There is no impacted cerumen.     Ears:     Comments: Mild serous effusion noted behind left TM without other signs of infection Cardiovascular:     Rate and Rhythm: Normal rate and regular rhythm.     Heart sounds: Normal heart sounds. No murmur heard.    No friction rub. No gallop.  Pulmonary:     Effort: Pulmonary effort is normal. No respiratory distress.     Breath sounds: Normal breath sounds.  Abdominal:     General: Bowel sounds are normal.  Musculoskeletal:        General: No swelling.     Cervical back: Neck supple.  Lymphadenopathy:     Cervical: No cervical adenopathy.  Skin:    General: Skin is warm and dry.  Neurological:     General: No focal deficit present.     Mental Status: She is alert.  Psychiatric:        Mood and Affect: Mood normal.  Behavior: Behavior normal.        Thought Content: Thought content normal.      Results for orders placed or performed in visit on 05/04/24  POCT HgB A1C  Result Value Ref Range   Hemoglobin A1C 4.8 4.0 - 5.6 %   HbA1c POC (<> result, manual entry)     HbA1c, POC (prediabetic range)     HbA1c, POC (controlled diabetic range)    POCT UA - Microalbumin  Result Value Ref Range   Microalbumin Ur, POC 80 mg/L   Creatinine, POC 200 mg/dL   Albumin/Creatinine Ratio, Urine, POC <30     Last CBC Lab Results  Component Value Date   WBC 7.0 11/29/2023   HGB 14.1 11/29/2023   HCT 41.9 11/29/2023   MCV 93 11/29/2023   MCH 31.3  11/29/2023   RDW 13.1 11/29/2023   PLT 297 11/29/2023   Last metabolic panel Lab Results  Component Value Date   GLUCOSE 153 (H) 12/13/2023   NA 138 12/13/2023   K 3.7 12/13/2023   CL 99 12/13/2023   CO2 21 12/13/2023   BUN 18 12/13/2023   CREATININE 1.04 (H) 12/13/2023   EGFR 58 (L) 12/13/2023   CALCIUM  10.1 12/13/2023   PHOS 1.3 (L) 01/14/2021   PROT 7.2 11/29/2023   ALBUMIN 4.6 11/29/2023   LABGLOB 2.6 11/29/2023   AGRATIO 1.7 08/24/2022   BILITOT 0.3 11/29/2023   ALKPHOS 84 11/29/2023   AST 14 11/29/2023   ALT 17 11/29/2023   ANIONGAP 15 01/18/2021   Last lipids Lab Results  Component Value Date   CHOL 115 11/29/2023   HDL 36 (L) 11/29/2023   LDLCALC 53 11/29/2023   LDLDIRECT 79 01/09/2021   TRIG 154 (H) 11/29/2023   CHOLHDL 3.2 11/29/2023   Last thyroid  functions Lab Results  Component Value Date   TSH 2.150 11/29/2023   T3TOTAL 126 08/29/2017   T4TOTAL 7.7 05/29/2018   FREET4 1.01 10/20/2018   Last vitamin D  Lab Results  Component Value Date   VD25OH 59.5 10/11/2021      The ASCVD Risk score (Arnett DK, et al., 2019) failed to calculate for the following reasons:   The valid total cholesterol range is 130 to 320 mg/dL    Assessment & Plan:   Type 2 diabetes mellitus with other specified complication, without long-term current use of insulin  (HCC) -     POCT glycosylated hemoglobin (Hb A1C) -     POCT UA - Microalbumin -     Tirzepatide ; Inject 15 mg into the skin once a week.  Dispense: 6 mL; Refill: 2 -     Accu-Chek Guide Test; Use as instructed  Dispense: 200 each; Refill: 3 -     BD Pen Needle Mini Ultrafine; Inject 100 each into the skin in the morning, at noon, and at bedtime.  Dispense: 100 each; Refill: 0 -     Accu-Chek Softclix Lancets; USE 1  TO CHECK GLUCOSE 4 TIMES DAILY  Dispense: 200 each; Refill: 0  Controlled type 2 diabetes mellitus without complication, with long-term current use of insulin  (HCC) Assessment & Plan: POC A1c  today further decreased to 4.7.  -Advised that once she finished her current box of 12.5 mg Mounjaro , discontinue Humulin  completely and begin Mounjaro  15 mg weekly. (Previously on 3 units of Humulin  daily). With A1c of 4.7, hypoglycemia becomes a concern on insulin  so will discontinue and watch fasting blood sugars closely.   Contact provider if blood sugars exceed  130-150 mg/dL. - Consider patient assistance program for Mounjaro  if cost is prohibitive.   Hypertension associated with diabetes (HCC) Assessment & Plan: BP goal <130/80. Stable, at goal. Continue losartan  and triamterene -hydrochlorothiazide  37.5-25 mg daily. Will continue to monitor.   Orders: -     Triamterene -HCTZ; Take 1 tablet by mouth daily.  Dispense: 90 tablet; Refill: 2 -     Aspirin ; Take 1 tablet (81 mg total) by mouth daily.  Dispense: 90 tablet; Refill: 2  Obesity, Class I, BMI 30-34.9 -     Aspirin ; Take 1 tablet (81 mg total) by mouth daily.  Dispense: 90 tablet; Refill: 2  Mixed diabetic hyperlipidemia associated with type 2 diabetes mellitus (HCC) Assessment & Plan: Last lipid panel: LDL 53, HDL 36, Trig 154. Continue atorvastatin  10 mg and fenofibrate  48 mg daily. If triglycerides continue to increase, may need to optimize dose of atorvastatin  versus increase fenofibrate .   Orders: -     Fenofibrate ; Take 1 tablet (48 mg total) by mouth daily.  Dispense: 90 tablet; Refill: 2  Mood disorder (HCC)- mixed anxiety and depression Assessment & Plan: In remission. Doing well since psychiatric provided discontinued venlafaxine . Patient's insurance stopped covering psychiatric appointments this year. Will continue to cover her CBT appointments. Will take over Zolpidem  script since patient will be losing coverage. PDMP reviewed. No aberrancies. Rx sent for 30 days.    Primary insomnia Assessment & Plan: Will take over Zolpidem  script at 12.5 mg ER nightly since patient will be losing coverage to see her psychiatric  provider. I am ok with taking this over since her anxiety and depression is currently in remission and she is no longer on medications for these conditions.  PDMP reviewed. No aberrancies. Rx sent for 30 days.    Microalbuminuria due to type 2 diabetes mellitus (HCC) Assessment & Plan: UACR collected today and within normal limits. Will cont to monitor.    Presbycusis of both ears Assessment & Plan: Decreased hearing noted on exam today. Ear exam normal with exception of mild effusion behind left TM likely secondary to recent respiratory illness. Recommended flonase and zyrtec for 2 weeks to resolve this. Discussed self referral to audiology for hearing test. Beltone discussed as popular option.    Other orders -     Zolpidem  Tartrate ER; Take 1 tablet (12.5 mg total) by mouth at bedtime.  Dispense: 30 tablet; Refill: 0     Return in about 4 months (around 09/01/2024) for HTN, DM, HLD.    Saddie JULIANNA Sacks, PA-C "

## 2024-05-04 NOTE — Assessment & Plan Note (Signed)
 UACR collected today and within normal limits. Will cont to monitor.

## 2024-05-04 NOTE — Assessment & Plan Note (Signed)
 Will take over Zolpidem  script at 12.5 mg ER nightly since patient will be losing coverage to see her psychiatric provider. I am ok with taking this over since her anxiety and depression is currently in remission and she is no longer on medications for these conditions.  PDMP reviewed. No aberrancies. Rx sent for 30 days.

## 2024-05-04 NOTE — Assessment & Plan Note (Signed)
 Last lipid panel: LDL 53, HDL 36, Trig 154. Continue atorvastatin  10 mg and fenofibrate  48 mg daily. If triglycerides continue to increase, may need to optimize dose of atorvastatin  versus increase fenofibrate .

## 2024-05-05 DIAGNOSIS — E1169 Type 2 diabetes mellitus with other specified complication: Secondary | ICD-10-CM

## 2024-05-05 MED ORDER — ACCU-CHEK GUIDE TEST VI STRP: ORAL_STRIP | 3 refills | Status: AC

## 2024-05-05 MED ORDER — ACCU-CHEK SOFTCLIX LANCETS MISC: 0 refills | Status: AC

## 2024-05-06 ENCOUNTER — Ambulatory Visit: Admitting: Sports Medicine

## 2024-05-06 ENCOUNTER — Other Ambulatory Visit: Payer: Self-pay

## 2024-05-06 ENCOUNTER — Encounter: Payer: Self-pay | Admitting: Sports Medicine

## 2024-05-06 DIAGNOSIS — Z794 Long term (current) use of insulin: Secondary | ICD-10-CM

## 2024-05-06 DIAGNOSIS — G8929 Other chronic pain: Secondary | ICD-10-CM

## 2024-05-06 DIAGNOSIS — M7918 Myalgia, other site: Secondary | ICD-10-CM

## 2024-05-06 DIAGNOSIS — G5702 Lesion of sciatic nerve, left lower limb: Secondary | ICD-10-CM

## 2024-05-06 DIAGNOSIS — M25552 Pain in left hip: Secondary | ICD-10-CM | POA: Diagnosis not present

## 2024-05-06 DIAGNOSIS — E1169 Type 2 diabetes mellitus with other specified complication: Secondary | ICD-10-CM | POA: Diagnosis not present

## 2024-05-06 MED ORDER — METHOCARBAMOL 750 MG PO TABS: 750.0000 mg | ORAL_TABLET | Freq: Two times a day (BID) | ORAL | 0 refills | Status: AC

## 2024-05-06 NOTE — Progress Notes (Signed)
 "  SHERYL SAINTIL - 69 y.o. female MRN [id number]  Date of birth: 1955/05/10  Office Visit Note: Visit Date: 05/06/2024 PCP: Gayle Saddie FALCON, PA-C Referred by: Gayle Saddie FALCON, PA-C  Subjective: Chief Complaint  Patient presents with   Left Hip - Follow-up   HPI: Mercedes Webb is a pleasant 69 y.o. female who presents today for f/u of chronic left posterior hip/buttock pain with sciatic nerve compression.  Discussed the use of AI scribe software for clinical note transcription with the patient, who gave verbal consent to proceed.  History of Present Illness Azariyah Luhrs is a 69 year old female with left piriformis syndrome who presents for follow-up of persistent left buttock/pifirormis syndorme and leg pain in the setting of functional sciatic nerve compression.  She has persistent dull, aching pain in the left buttock and posterior thigh radiating to the knee, which has intensified over the past two weeks. She denies numbness, paresthesia, or weakness. She notes intermittent muscle tightness and a bulged up muscle sensation in the lateral leg that improves with self-massage and a massage device.  She performs monthly massage therapy and home exercises with stretching and resistance bands, but pain remains significant and disrupts sleep. She cannot find a comfortable position and often wakes early due to pain. She is a side sleeper but cannot tolerate lying on either side or her back when symptoms are severe.  She previously tried nighttime Flexeril  without adequate relief and stopped it. She avoids ibuprofen and Celebrex  due to renal concerns and diabetes per her primary care provider. Acetaminophen is ineffective and causes dyspepsia. She currently takes zolpidem  nightly for sleep.  She has recent weight loss and improved glycemic control. Her diabetes regimen includes Mounjaro  and a reduced dose of Humulin  insulin .  She is a type II diabetic extremely well-controlled.   She has tolerated his hydrodissection injections well without significant increase glucose.  She will continue 3 units of Humulin  insulin  daily in the morning.  Mounjaro  12.5mg  IM weekly.  Lab Results  Component Value Date   HGBA1C 4.8 05/04/2024     Pertinent ROS were reviewed with the patient and found to be negative unless otherwise specified above in HPI.   Assessment & Plan: Visit Diagnoses:  1. Compression of left sciatic nerve   2. Piriformis syndrome of left side   3. Chronic left hip pain   4. Pain in left buttock   5. Type 2 diabetes mellitus with other specified complication, with long-term current use of insulin  [facility])    Assessment & Plan Piriformis syndrome of left side with sciatic nerve compression and chronic left hip pain Chronic left hip and buttock pain radiating to the posterior thigh, consistent with piriformis syndrome and sciatic nerve compression. Symptoms worsened over two weeks. Previous injections provided good, but temporary relief (1st - helped quite a bit; 2nd - lesser benefit) Surgical intervention for piriformis muscle not favored; neurosurgical referral considered if injections fail for sciatic nerve release considered - Administered ultrasound-guided injection to left piriformis region with sciatic nerve hydrodissection/nerurolysis with good spread visualized. - Discontinued Flexeril  due to insufficient efficacy and side effects. - Prescribed methocarbamol  (Robaxin ), evening for one week, then twice daily as tolerated. - Recommended clamshell exercise, starting without resistance, progressing to band resistance. - Advised continuation of monthly massage therapy with Jen. - Guided use of heat therapy for symptomatic relief. - Instructed update via message in two weeks regarding symptom progression and response, with conditional in-office follow-up. - Discussed  neurosurgical decompression if injections and conservative measures fail; explained surgical  release of piriformis muscle not recommended. - Discussed transient glucose rise given CSI, although has done well in the past without notable increase.  She will continue her Humalog 3 units daily and adjust sliding scale based on elevated glucose.  Commended her on weight loss and dietary control, she will continue Mounjaro  12.5 mg weekly and continue follow-up with her PCP  *Additional considerations: - If HD/neurolysis significantly helpful --> consider referral to Dr. Claudene for consideration of surgical nerve release - Norvel Piedra PT for DN and soft tissue work  Follow-up: Will message me update in few weeks   Meds & Orders:  Meds ordered this encounter  Medications   methocarbamol  (ROBAXIN ) 750 MG tablet    Sig: Take 1 tablet (750 mg total) by mouth 2 (two) times daily.    Dispense:  60 tablet    Refill:  0    Orders Placed This Encounter  Procedures   US  Guided Needle Placement - No Linked Charges     Procedures: US -guided sciatic nerve hydrodissection (neurolysis), Left leg/Piriformis: After discussion on risks/benefits/indications, informed verbal consent was obtained. A timeout was then performed. Patient was placed in prone position on the table in exam room with affected leg facing upward in a neutral position. The overlying area was prepped with ChloraPrep and multiple alcohol swabs. Utilizing ultrasound guidance, the sciatic nerve and surrounding neurovasculature was identified inferior to the piriformis muscle. Following identification, using ultrasound guidance via an in-plane approach, a 22-gauge, 3.5 inch needle was inserted from a lateral to medial direction around the exiting sciatic nerve. Using a mixture of 3:3:12:1 cc's of lidocaine :bupivicaine:D5W:methylprednisolone , the nerve was hydrodissected. The needle was inserted both above and below the nerve with ultrasound visualization of 360 degrees spread and appropriate hydrodissection and lysis of the nerve. Patient  tolerated the procedure well without immediate complication.  Band-Aid was applied.          Clinical History: No specialty comments available.  She reports that she quit smoking about 36 years ago. Her smoking use included cigarettes. She started smoking about 54 years ago. She has a 9 pack-year smoking history. She has never been exposed to tobacco smoke. She has never used smokeless tobacco.  Recent Labs    06/25/23 1025 11/29/23 1019 05/04/24 0914  HGBA1C 5.2 5.1 4.8    Objective:    Physical Exam  Gen: Well-appearing, in no acute distress; non-toxic CV: Well-perfused. Warm.  Resp: Breathing unlabored on room air; no wheezing. Psych: Fluid speech in conversation; appropriate affect; normal thought process  *MSK/Ortho Exam: Physical Exam MUSCULOSKELETAL: Hips and pelvis normal. Tenderness in the hip region. Hip strength normal. Tenderness at piriformis region.  - Left hip/gluteal region: + TTP to palpation in the buttock.  The overlying musculature and piriformis.  There is good activation of the piriformis with internal and external logroll.  There is excellent preserved strength with hip adduction and hip flexion.  Imaging:  Narrative & Impression  MR HIP WITHOUT IV CONTRAST LEFT   COMPARISON: None.   CLINICAL HISTORY: Left hip pain, chronic   PULSE SEQUENCES: AX T1, Ax T2 FS, Cor T1, COR STIR & SMALL FOV COR PD FS without contrast.   FINDINGS:   Bones and labrum: There is no fracture or contusion pattern. No accelerated arthrosis is present. No subchondral reactive edema or significant joint effusion is present. The labrum is unremarkable.   Musculotendinous structures: No significant tendinosis or myositis is  present. No bursal collection.   IMPRESSION: Unremarkable MRI of the left hip.   Electronically signed by: Norleen Satchel MD 02/10/2024 12:19 PM EDT RP Workstation: [id number]    Past Medical/Family/Surgical/Social History: Medications &  Allergies reviewed per EMR, new medications updated. Patient Active Problem List   Diagnosis Date Noted   Presbycusis 05/04/2024   Pain in left hip 01/04/2022   Trochanteric bursitis, left hip 11/07/2021   Microalbuminuria due to type 2 diabetes mellitus (HCC) 06/17/2019   Controlled type 2 diabetes mellitus without complication, with long-term current use of insulin  (HCC) 10/23/2018   Secondary hyperhidrosis- not focal in nature but primarily from waist up is the worst 08/20/2018   Low serum progesterone  05/30/2018   Excessive sweating- likely due to hormonal 11/11/2017   Elevated serum creatinine 09/04/2017   Elevated ALT measurement 09/04/2017   Postmenopausal- 30 yrs ago TAH yrs ago 11/12/2016   Hormone imbalance- txed by gyn 11/12/2016   Primary insomnia 11/12/2016   Mood disorder (HCC)- mixed anxiety and depression 11/12/2016   Vitamin D  deficiency 09/28/2016   Mixed diabetic hyperlipidemia associated with type 2 diabetes mellitus (HCC) 09/28/2016   Hypokalemia 02/01/2016   Hypertension associated with diabetes (HCC) 01/31/2016   Generalized anxiety disorder 01/31/2016   Surgical menopause 12/23/2014   Status post abdominal hysterectomy 12/23/2014   History of cervical dysplasia 12/23/2014   SUI (stress urinary incontinence, female) 12/23/2014   Obesity, Class I, BMI 30-34.9 12/23/2014   Past Medical History:  Diagnosis Date   Anxiety    Cancer (HCC) 2016   Skin CA squamous cell on bilateral feet removed   Cervical dysplasia    h/o   Colon polyps    polyps on first scope ~ 2006, no recurrent polyps ~ 2001 and in 2016.    Diabetes mellitus without complication (HCC)    Endometriosis    Family history of adverse reaction to anesthesia    Mother had difficulty waking & nausea   Headache    Hypertension    Insomnia    Pancreatitis 01/2016   Recurrent cold sores    Family History  Problem Relation Age of Onset   Diabetes Father    Heart disease Father    Breast  cancer Sister 74   Diabetes Brother    Colon cancer Neg Hx    Ovarian cancer Neg Hx    Past Surgical History:  Procedure Laterality Date   ABDOMINAL HYSTERECTOMY     tah/bso- endometriosis   CHOLECYSTECTOMY     ERCP N/A 02/03/2016   Procedure: ENDOSCOPIC RETROGRADE CHOLANGIOPANCREATOGRAPHY (ERCP);  Surgeon: Belvie Just, MD;  Location: [facility] ENDOSCOPY;  Service: Endoscopy;  Laterality: N/A;   KNEE ARTHROSCOPY     tonsillectomy     TONSILLECTOMY     TUBAL LIGATION     Social History   Occupational History   Not on file  Tobacco Use   Smoking status: Former    Current packs/day: 0.00    Average packs/day: 0.5 packs/day for 18.0 years (9.0 ttl pk-yrs)    Types: Cigarettes    Start date: 04/30/1970    Quit date: 04/30/1988    Years since quitting: 36.0    Passive exposure: Never   Smokeless tobacco: Never  Vaping Use   Vaping status: Never Used  Substance and Sexual Activity   Alcohol use: Not Currently   Drug use: Yes    Types: Hashish   Sexual activity: Yes    Partners: Male    Birth control/protection: Surgical   "

## 2024-05-07 ENCOUNTER — Telehealth: Payer: Self-pay

## 2024-05-07 ENCOUNTER — Ambulatory Visit (INDEPENDENT_AMBULATORY_CARE_PROVIDER_SITE_OTHER)

## 2024-05-07 VITALS — Ht 67.0 in | Wt 154.0 lb

## 2024-05-07 DIAGNOSIS — Z0000 Encounter for general adult medical examination without abnormal findings: Secondary | ICD-10-CM

## 2024-05-07 NOTE — Progress Notes (Signed)
 "  Chief Complaint  Patient presents with   Medicare Wellness     Subjective:   Mercedes Webb is a 69 y.o. female who presents for a Medicare Annual Wellness Visit.  Visit info / Clinical Intake: Medicare Wellness Visit Type:: Subsequent Annual Wellness Visit Persons participating in visit and providing information:: patient Medicare Wellness Visit Mode:: Telephone If telephone:: video declined Since this visit was completed virtually, some vitals may be partially provided or unavailable. Missing vitals are due to the limitations of the virtual format.: Documented vitals are patient reported If Telephone or Video please confirm:: I connected with patient using audio/video enable telemedicine. I verified patient identity with two identifiers, discussed telehealth limitations, and patient agreed to proceed. Patient Location:: Home Provider Location:: Office Interpreter Needed?: No Pre-visit prep was completed: yes AWV questionnaire completed by patient prior to visit?: no Living arrangements:: (!) lives alone Patient's Overall Health Status Rating: good Typical amount of pain: some (left hip - rating at 7) Does pain affect daily life?: (!) yes Are you currently prescribed opioids?: no  Dietary Habits and Nutritional Risks How many meals a day?: 3 Eats fruit and vegetables daily?: yes Most meals are obtained by: preparing own meals In the last 2 weeks, have you had any of the following?: none Diabetic:: (!) yes Any non-healing wounds?: no How often do you check your BS?: 2; as needed (fasting - 136) Would you like to be referred to a Nutritionist or for Diabetic Management? : no  Functional Status Activities of Daily Living (to include ambulation/medication): Independent Ambulation: Independent with device- listed below Home Assistive Devices/Equipment: Eyeglasses Medication Administration: Independent Home Management (perform basic housework or laundry):  Independent Manage your own finances?: yes Primary transportation is: driving Concerns about vision?: no *vision screening is required for WTM* Concerns about hearing?: no  Fall Screening Falls in the past year?: 0 Number of falls in past year: 0 Was there an injury with Fall?: 0 Fall Risk Category Calculator: 0 Patient Fall Risk Level: Low Fall Risk  Fall Risk Patient at Risk for Falls Due to: No Fall Risks Fall risk Follow up: Falls evaluation completed; Falls prevention discussed  Home and Transportation Safety: All rugs have non-skid backing?: N/A, no rugs All stairs or steps have railings?: yes (outside) Grab bars in the bathtub or shower?: (!) no Have non-skid surface in bathtub or shower?: yes Good home lighting?: yes Regular seat belt use?: yes Hospital stays in the last year:: no  Cognitive Assessment Difficulty concentrating, remembering, or making decisions? : no Will 6CIT or Mini Cog be Completed: yes What year is it?: 0 points What month is it?: 0 points Give patient an address phrase to remember (5 components): [address] [place], [place] About what time is it?: 0 points Count backwards from 20 to 1: 0 points Say the months of the year in reverse: 0 points Repeat the address phrase from earlier: 0 points 6 CIT Score: 0 points  Advance Directives (For Healthcare) Does Patient Have a Medical Advance Directive?: No Would patient like information on creating a medical advance directive?: No - Patient declined  Reviewed/Updated  Reviewed/Updated: Reviewed All (Medical, Surgical, Family, Medications, Allergies, Care Teams, Patient Goals)    Allergies (verified) Patient has no known allergies.   Current Medications (verified) Outpatient Encounter Medications as of 05/07/2024  Medication Sig   Accu-Chek Softclix Lancets lancets USE 1  TO CHECK GLUCOSE 4 TIMES DAILY   Ascorbic Acid (VITAMIN C) 1000 MG tablet Take 1,000  mg by mouth daily.   aspirin  EC (ASPIRIN   EC ADULT LOW DOSE) 81 MG tablet Take 1 tablet (81 mg total) by mouth daily.   atorvastatin  (LIPITOR) 10 MG tablet TAKE 1 TABLET BY MOUTH NIGHTLY.   blood glucose meter kit and supplies Dispense based on patient and insurance preference. Use to check fasting blood sugar once daily.   Calcium  Carbonate (CALTRATE 600 PO) Take 600 mg by mouth daily.   fenofibrate  (TRICOR ) 48 MG tablet Take 1 tablet (48 mg total) by mouth daily.   glucose blood (ACCU-CHEK GUIDE TEST) test strip Use as instructed. TO CHECK GLUCOSE 4 TIMES DAILY   HUMULIN  70/30 KWIKPEN (70-30) 100 UNIT/ML KwikPen INJECT 13 UNITS INTO THE SKIN IN THE MORNING AND 20 UNITS INTO THE SKIN AT BEDTIME.   Insulin  Pen Needle (BD PEN NEEDLE MINI ULTRAFINE) 31G X 5 MM MISC Inject 100 each into the skin in the morning, at noon, and at bedtime.   losartan  (COZAAR ) 25 MG tablet TAKE 1 TABLET BY MOUTH DAILY   methocarbamol  (ROBAXIN ) 750 MG tablet Take 1 tablet (750 mg total) by mouth 2 (two) times daily.   Multiple Vitamin (MULTIVITAMIN WITH MINERALS) TABS tablet Take 1 tablet by mouth daily. Centrum Silver   tirzepatide  (MOUNJARO ) 15 MG/0.5ML Pen Inject 15 mg into the skin once a week.   triamterene -hydrochlorothiazide  (MAXZIDE -25) 37.5-25 MG tablet Take 1 tablet by mouth daily.   valACYclovir  (VALTREX ) 500 MG tablet TAKE 1 TABLET BY MOUTH DAILY.   zolpidem  (AMBIEN  CR) 12.5 MG CR tablet Take 1 tablet (12.5 mg total) by mouth at bedtime.   No facility-administered encounter medications on file as of 05/07/2024.    History: Past Medical History:  Diagnosis Date   Anxiety    Cancer (HCC) 2016   Skin CA squamous cell on bilateral feet removed   Cervical dysplasia    h/o   Colon polyps    polyps on first scope ~ 2006, no recurrent polyps ~ 2001 and in 2016.    Diabetes mellitus without complication (HCC)    Endometriosis    Family history of adverse reaction to anesthesia    Mother had difficulty waking & nausea   Headache    Hypertension     Insomnia    Pancreatitis 01/2016   Recurrent cold sores    Past Surgical History:  Procedure Laterality Date   ABDOMINAL HYSTERECTOMY     tah/bso- endometriosis   CHOLECYSTECTOMY     ERCP N/A 02/03/2016   Procedure: ENDOSCOPIC RETROGRADE CHOLANGIOPANCREATOGRAPHY (ERCP);  Surgeon: Belvie Just, MD;  Location: [facility] ENDOSCOPY;  Service: Endoscopy;  Laterality: N/A;   KNEE ARTHROSCOPY     tonsillectomy     TONSILLECTOMY     TUBAL LIGATION     Family History  Problem Relation Age of Onset   Diabetes Father    Heart disease Father    Breast cancer Sister 27   Diabetes Brother    Colon cancer Neg Hx    Ovarian cancer Neg Hx    Social History   Occupational History   Not on file  Tobacco Use   Smoking status: Former    Current packs/day: 0.00    Average packs/day: 0.5 packs/day for 18.0 years (9.0 ttl pk-yrs)    Types: Cigarettes    Start date: 04/30/1970    Quit date: 04/30/1988    Years since quitting: 36.0    Passive exposure: Never   Smokeless tobacco: Never  Vaping Use   Vaping status: Never Used  Substance and Sexual Activity   Alcohol use: Not Currently   Drug use: Yes    Types: Hashish   Sexual activity: Yes    Partners: Male    Birth control/protection: Surgical   Tobacco Counseling Counseling given: Yes  SDOH Screenings   Food Insecurity: No Food Insecurity (05/07/2024)  Housing: Unknown (05/07/2024)  Transportation Needs: No Transportation Needs (05/07/2024)  Utilities: Not At Risk (05/07/2024)  Alcohol Screen: Low Risk (10/04/2022)  Depression (PHQ2-9): Low Risk (05/07/2024)  Financial Resource Strain: Low Risk (11/25/2023)  Physical Activity: Inactive (05/07/2024)  Social Connections: Socially Isolated (05/07/2024)  Stress: No Stress Concern Present (05/07/2024)  Tobacco Use: Medium Risk (05/07/2024)  Health Literacy: Adequate Health Literacy (05/07/2024)   See flowsheets for full screening details  Depression Screen PHQ 2 & 9 Depression Scale- Over the past 2 weeks, how  often have you been bothered by any of the following problems? Little interest or pleasure in doing things: 0 Feeling down, depressed, or hopeless (PHQ Adolescent also includes...irritable): 0 PHQ-2 Total Score: 0 Trouble falling or staying asleep, or sleeping too much: 1 (just trying to get comfortable - left hip discomfort) Feeling tired or having little energy: 0 Poor appetite or overeating (PHQ Adolescent also includes...weight loss): 0 Feeling bad about yourself - or that you are a failure or have let yourself or your family down: 0 Trouble concentrating on things, such as reading the newspaper or watching television (PHQ Adolescent also includes...like school work): 0 Moving or speaking so slowly that other people could have noticed. Or the opposite - being so fidgety or restless that you have been moving around a lot more than usual: 0 Thoughts that you would be better off dead, or of hurting yourself in some way: 0 PHQ-9 Total Score: 1 If you checked off any problems, how difficult have these problems made it for you to do your work, take care of things at home, or get along with other people?: Not difficult at all  Depression Treatment Depression Interventions/Treatment : EYV7-0 Score <4 Follow-up Not Indicated; Medication; Currently on Treatment     Goals Addressed               This Visit's Progress     Patient Stated (pt-stated)        Patient stated she's managing her diet and left hip discomfort             Objective:    Today's Vitals   05/07/24 1202  Weight: 154 lb (69.9 kg)  Height: 5' 7 (1.702 m)   Body mass index is 24.12 kg/m.  Hearing/Vision screen Hearing Screening - Comments:: Denies hearing difficulties   Vision Screening - Comments:: Wears rx glasses - up to date with routine eye exams with  [facility] Immunizations and Health Maintenance Health Maintenance  Topic Date Due   COVID-19 Vaccine (4 - 2025-26 season) 12/30/2023   HEMOGLOBIN A1C   11/01/2024   Diabetic kidney evaluation - eGFR measurement  12/12/2024   Colonoscopy  02/06/2025   OPHTHALMOLOGY EXAM  03/18/2025   Diabetic kidney evaluation - Urine ACR  05/04/2025   FOOT EXAM  05/04/2025   Medicare Annual Wellness (AWV)  05/07/2025   Mammogram  12/31/2025   DTaP/Tdap/Td (2 - Td or Tdap) 10/12/2031   Pneumococcal Vaccine: 50+ Years  Completed   Influenza Vaccine  Completed   Bone Density Scan  Completed   Hepatitis C Screening  Completed   Zoster Vaccines- Shingrix  Completed   Meningococcal B  Vaccine  Aged Out        Assessment/Plan:  This is a routine wellness examination for Mercedes Webb.  Patient Care Team: Gayle Saddie JULIANNA DEVONNA as PCP - General (Physician Assistant) Defrancesco, Gladis LABOR, MD as Consulting Physician (Obstetrics and Gynecology) Haigler, Delaine SAILOR, CNM (Inactive) as Midwife (Obstetrics and Gynecology) Joshua Sieving, MD as Consulting Physician (Dermatology) Luis Purchase, MD as Consulting Physician (Gastroenterology) Rollin Dover, MD as Consulting Physician (Gastroenterology) Debarah Lorrene DEL., MD (Ophthalmology) Lenon Delaine SAILOR, CNM (Inactive) as Midwife (Obstetrics and Gynecology) Zoraida Slater BRAVO, PhD as Consulting Physician (Psychology)  I have personally reviewed and noted the following in the patients chart:   Medical and social history Use of alcohol, tobacco or illicit drugs  Current medications and supplements including opioid prescriptions. Functional ability and status Nutritional status Physical activity Advanced directives List of other physicians Hospitalizations, surgeries, and ER visits in previous 12 months Vitals Screenings to include cognitive, depression, and falls Referrals and appointments  No orders of the defined types were placed in this encounter.  In addition, I have reviewed and discussed with patient certain preventive protocols, quality metrics, and best practice recommendations. A written personalized  care plan for preventive services as well as general preventive health recommendations were provided to patient.   Verdie CHRISTELLA Saba, CMA   05/07/2024   Return in 1 year (on 05/07/2025).  After Visit Summary: (MyChart) Due to this being a telephonic visit, the after visit summary with patients personalized plan was offered to patient via MyChart   Nurse Notes: scheduled 2027 AWV appt "

## 2024-05-07 NOTE — Telephone Encounter (Signed)
Three times per day

## 2024-05-07 NOTE — Telephone Encounter (Signed)
 Copied from CRM [id number]. Topic: Clinical - Prescription Issue >> May 07, 2024  8:47 AM Wyona P wrote: Reason for CRM: Ronal called in to say theyr received order for Accu-Chek Test Strips, needs to know how many times she needs to test to be able to bill to insurance.   Please Advise   Ronal GLENWOOD [facility] [phone]

## 2024-05-07 NOTE — Telephone Encounter (Signed)
 requesting a return phonecall from nurse - has Mercedes Webb, Mercedes Webb done the pharmacy application for Mounjaro ?

## 2024-05-07 NOTE — Telephone Encounter (Signed)
 Called and spoken with patient; related message from provider- Saddie.

## 2024-05-07 NOTE — Patient Instructions (Addendum)
 [name],  Thank you for taking the time for your Medicare Wellness Visit. I appreciate your continued commitment to your health goals. Please review the care plan we discussed, and feel free to reach out if I can assist you further.  Please note that Annual Wellness Visits do not include a physical exam. Some assessments may be limited, especially if the visit was conducted virtually. If needed, we may recommend an in-person follow-up with your provider.  Ongoing Care Seeing your primary care provider every 3 to 6 months helps us  monitor your health and provide consistent, personalized care.   Referrals If a referral was made during today's visit and you haven't received any updates within two weeks, please contact the referred provider directly to check on the status.  Recommended Screenings:  Health Maintenance  Topic Date Due   COVID-19 Vaccine (4 - 2025-26 season) 12/30/2023   Hemoglobin A1C  11/01/2024   Yearly kidney function blood test for diabetes  12/12/2024   Colon Cancer Screening  02/06/2025   Eye exam for diabetics  03/18/2025   Yearly kidney health urinalysis for diabetes  05/04/2025   Complete foot exam   05/04/2025   Medicare Annual Wellness Visit  05/07/2025   Breast Cancer Screening  12/31/2025   DTaP/Tdap/Td vaccine (2 - Td or Tdap) 10/12/2031   Pneumococcal Vaccine for age over 57  Completed   Flu Shot  Completed   Osteoporosis screening with Bone Density Scan  Completed   Hepatitis C Screening  Completed   Zoster (Shingles) Vaccine  Completed   Meningitis B Vaccine  Aged Out       05/07/2024   12:04 PM  Advanced Directives  Does Patient Have a Medical Advance Directive? No  Would patient like information on creating a medical advance directive? No - Patient declined    Vision: Annual vision screenings are recommended for early detection of glaucoma, cataracts, and diabetic retinopathy. These exams can also reveal signs of chronic conditions such as  diabetes and high blood pressure.  Dental: Annual dental screenings help detect early signs of oral cancer, gum disease, and other conditions linked to overall health, including heart disease and diabetes.

## 2024-05-08 ENCOUNTER — Telehealth: Payer: Self-pay

## 2024-05-08 ENCOUNTER — Other Ambulatory Visit (HOSPITAL_COMMUNITY): Payer: Self-pay

## 2024-05-08 NOTE — Telephone Encounter (Signed)
 Pharmacy Patient Advocate Encounter   Received notification from Physician's Office that prior authorization for Mounjaro  15 mg/0.5 ml pen is required/requested.   Insurance verification completed.   The patient is insured through [place].   Per test claim: Patient may be eligible for a Medicare prescription Payment plan. The patient will need to reach out to their insurance company to enrol in the payment plan to spread out their payments throughout the year, If available. This test claim was processed through [facility]- copay amounts may vary at other pharmacies due to pharmacy/plan contracts, or as the patient moves through the different stages of their insurance plan.

## 2024-05-25 ENCOUNTER — Encounter: Payer: Self-pay | Admitting: Sports Medicine

## 2024-05-26 ENCOUNTER — Other Ambulatory Visit: Payer: Self-pay | Admitting: Sports Medicine

## 2024-05-26 DIAGNOSIS — G8929 Other chronic pain: Secondary | ICD-10-CM

## 2024-05-26 DIAGNOSIS — G5702 Lesion of sciatic nerve, left lower limb: Secondary | ICD-10-CM

## 2024-05-26 MED ORDER — PREGABALIN 75 MG PO CAPS: ORAL_CAPSULE | ORAL | 1 refills | Status: AC

## 2024-08-25 ENCOUNTER — Other Ambulatory Visit

## 2024-09-01 ENCOUNTER — Ambulatory Visit

## 2025-05-20 ENCOUNTER — Ambulatory Visit
